# Patient Record
Sex: Female | Born: 1950
Health system: Southern US, Community
[De-identification: ages and names within clinical notes are randomized; demographics above are authoritative.]

## PROBLEM LIST (undated history)

## (undated) DIAGNOSIS — I10 Essential (primary) hypertension: Secondary | ICD-10-CM

## (undated) DIAGNOSIS — E039 Hypothyroidism, unspecified: Secondary | ICD-10-CM

## (undated) DIAGNOSIS — I639 Cerebral infarction, unspecified: Secondary | ICD-10-CM

## (undated) HISTORY — PX: TUBAL LIGATION: SHX77

## (undated) HISTORY — DX: Cerebral infarction, unspecified: I63.9

## (undated) HISTORY — DX: Hypothyroidism, unspecified: E03.9

## (undated) HISTORY — PX: LEG SURGERY: SHX1003

---

## 2013-05-19 ENCOUNTER — Ambulatory Visit (INDEPENDENT_AMBULATORY_CARE_PROVIDER_SITE_OTHER): Payer: BC Managed Care – PPO | Admitting: Internal Medicine

## 2013-05-19 ENCOUNTER — Ambulatory Visit: Payer: BC Managed Care – PPO

## 2013-05-19 VITALS — BP 170/98 | HR 72 | Temp 97.9°F | Resp 18 | Ht 63.0 in | Wt 219.8 lb

## 2013-05-19 DIAGNOSIS — IMO0001 Reserved for inherently not codable concepts without codable children: Secondary | ICD-10-CM

## 2013-05-19 DIAGNOSIS — G4710 Hypersomnia, unspecified: Secondary | ICD-10-CM

## 2013-05-19 DIAGNOSIS — R0602 Shortness of breath: Secondary | ICD-10-CM

## 2013-05-19 DIAGNOSIS — I517 Cardiomegaly: Secondary | ICD-10-CM

## 2013-05-19 DIAGNOSIS — Z0000 Encounter for general adult medical examination without abnormal findings: Secondary | ICD-10-CM

## 2013-05-19 DIAGNOSIS — R5383 Other fatigue: Secondary | ICD-10-CM

## 2013-05-19 DIAGNOSIS — R030 Elevated blood-pressure reading, without diagnosis of hypertension: Secondary | ICD-10-CM

## 2013-05-19 DIAGNOSIS — Z6838 Body mass index (BMI) 38.0-38.9, adult: Secondary | ICD-10-CM

## 2013-05-19 DIAGNOSIS — J301 Allergic rhinitis due to pollen: Secondary | ICD-10-CM

## 2013-05-19 DIAGNOSIS — F17200 Nicotine dependence, unspecified, uncomplicated: Secondary | ICD-10-CM

## 2013-05-19 DIAGNOSIS — R5381 Other malaise: Secondary | ICD-10-CM

## 2013-05-19 LAB — POCT CBC
Granulocyte percent: 65.9 %G (ref 37–80)
HCT, POC: 45.1 % (ref 37.7–47.9)
Hemoglobin: 13.9 g/dL (ref 12.2–16.2)
Lymph, poc: 2.5 (ref 0.6–3.4)
MCH: 29.8 pg (ref 27–31.2)
MCHC: 30.8 g/dL — AB (ref 31.8–35.4)
MCV: 96.5 fL (ref 80–97)
MID (cbc): 0.6 (ref 0–0.9)
MPV: 8.8 fL (ref 0–99.8)
PLATELET COUNT, POC: 354 10*3/uL (ref 142–424)
POC Granulocyte: 6.1 (ref 2–6.9)
POC LYMPH %: 27.2 % (ref 10–50)
POC MID %: 6.9 % (ref 0–12)
RBC: 4.67 M/uL (ref 4.04–5.48)
RDW, POC: 15.4 %
WBC: 9.2 10*3/uL (ref 4.6–10.2)

## 2013-05-19 LAB — COMPREHENSIVE METABOLIC PANEL
ALBUMIN: 4.1 g/dL (ref 3.5–5.2)
ALT: 15 U/L (ref 0–35)
AST: 17 U/L (ref 0–37)
Alkaline Phosphatase: 85 U/L (ref 39–117)
BUN: 15 mg/dL (ref 6–23)
CALCIUM: 9.1 mg/dL (ref 8.4–10.5)
CHLORIDE: 103 meq/L (ref 96–112)
CO2: 27 mEq/L (ref 19–32)
CREATININE: 0.95 mg/dL (ref 0.50–1.10)
Glucose, Bld: 123 mg/dL — ABNORMAL HIGH (ref 70–99)
POTASSIUM: 4 meq/L (ref 3.5–5.3)
Sodium: 140 mEq/L (ref 135–145)
Total Bilirubin: 0.5 mg/dL (ref 0.2–1.2)
Total Protein: 7.6 g/dL (ref 6.0–8.3)

## 2013-05-19 LAB — T4, FREE: Free T4: 0.28 ng/dL — ABNORMAL LOW (ref 0.80–1.80)

## 2013-05-19 LAB — TSH: TSH: 24.655 u[IU]/mL — ABNORMAL HIGH (ref 0.350–4.500)

## 2013-05-19 LAB — LIPID PANEL
CHOL/HDL RATIO: 8.2 ratio
Cholesterol: 337 mg/dL — ABNORMAL HIGH (ref 0–200)
HDL: 41 mg/dL (ref >39)
LDL CALC: 261 mg/dL — AB (ref 0–99)
Triglycerides: 177 mg/dL — ABNORMAL HIGH (ref <150)
VLDL: 35 mg/dL (ref 0–40)

## 2013-05-19 NOTE — Progress Notes (Addendum)
Subjective:    Patient ID: Carla Hopkins, female    DOB: 1950-07-08, 63 y.o.   MRN: [id number]  HPI This chart was scribed for Tami Lin, MD by Thea Alken, ED Scribe. This patient was seen in room 11 and the patient's care was started at 11:51 AM. She presents out of concern for her fatigue and shortness of breath.  HPI Comments: Carla Hopkins is a 63 y.o. female who presents to the [facility] for a physical. She reports that she has been tired and has had trouble sleeping. She reports when she wakes up in th morning she does not feel fully rested.  She states that when reading and watching TV she falls asleep easily. She denies falling asleep while driving long distances. She reports SOB while exercising. Pt states that she is a smoker but would love to quit. She reports that she has tried nicotine gum and patches. may go all day without smoking at work that day and smokes when she sits still at night or with coffee.   She denies HTN or medical illness. Pt denies taking medication for any illness. Pt states that she has annual allergies. Pt has never had a colonoscopy. She reports her last pap smear was about 10 years ago and her last mammogram was about 20 years ago. Pt has a bad knee due to a very old injury. She reports leg swelling during hot weather.   Unsure about immunizations History reviewed. No pertinent past medical history. 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 Negative for fever, activity change and unexpected weight change.  HENT: Negative for trouble swallowing.        Mouth breather at night/lives alone unsure if snores  Respiratory: Positive for shortness of breath. Negative for cough, chest tightness and wheezing.   Cardiovascular: Negative for chest pain, palpitations and  leg swelling.  Gastrointestinal: Negative for abdominal pain.  Genitourinary: Negative for frequency.  Neurological: Negative for weakness and headaches.  Psychiatric/Behavioral: Positive for sleep disturbance.       Wakes frequently/denies waking gasping for air Nonrestorative sleep Daytime hypersomnolence Falls asleep sitting to read but not driving  All other systems reviewed and are negative.      Objective:   Physical Exam  Nursing note and vitals reviewed. Constitutional: She is oriented to person, place, and time. She appears well-developed and well-nourished. No distress.  Very obese  HENT:  Head: Normocephalic and atraumatic.  Right Ear: External ear normal.  Left Ear: External ear normal.  Nose: Nose normal.  Mouth/Throat: Oropharynx is clear and moist.  Eyes: Conjunctivae and EOM are normal. Pupils are equal, round, and reactive to light.  Neck: Normal range of motion. Neck supple. No thyromegaly present.  Cardiovascular: Normal rate, regular rhythm, normal heart sounds and intact distal pulses.   No murmur heard. Pulmonary/Chest: Effort normal. She has no wheezes.  Distant breath sounds with shallow inspiration but no wheezing with forced expiration and no rhonchi  Abdominal: Soft. Bowel sounds are normal. She exhibits no mass. There is no tenderness.  Musculoskeletal: Normal range of motion. She exhibits no edema.  Lymphadenopathy:    She has no cervical adenopathy.  Neurological: She is alert and oriented to person, place, and time. She has normal reflexes. No cranial nerve deficit.  Skin: Skin is  warm and dry.  Psychiatric: She has a normal mood and affect. Her behavior is normal.   Filed Vitals:   05/19/13 1112  BP: 170/98  Pulse: 72  Temp: 97.9 F (36.6 C)  Resp: 18   Results for orders placed in visit on 05/19/13  POCT CBC      Result Value Ref Range   WBC 9.2  4.6 - 10.2 K/uL   Lymph, poc 2.5  0.6 - 3.4   POC LYMPH PERCENT 27.2  10 - 50 %L   MID  (cbc) 0.6  0 - 0.9   POC MID % 6.9  0 - 12 %M   POC Granulocyte 6.1  2 - 6.9   Granulocyte percent 65.9  37 - 80 %G   RBC 4.67  4.04 - 5.48 M/uL   Hemoglobin 13.9  12.2 - 16.2 g/dL   HCT, POC 16.145.1  09.637.7 - 47.9 %   MCV 96.5  80 - 97 fL   MCH, POC 29.8  27 - 31.2 pg   MCHC 30.8 (*) 31.8 - 35.4 g/dL   RDW, POC 04.515.4     Platelet Count, POC 354  142 - 424 K/uL   MPV 8.8  0 - 99.8 fL   [facility] reading (PRIMARY) by  Dr. Merla Richesoolittle= cardiomegaly?/? wide ascending aorta causing tracheal deviation      Assessment & Plan:  I have completed the patient encounter in its entirety as documented by the scribe, with editing by me where necessary. Robert P. Merla Richesoolittle, M.D.  Routine general medical examination at a health care facility - Plan: POCT CBC, T4, free, TSH, Lipid panel, Comprehensive metabolic panel  BMI 38.0-38.9,adult - Plan: Ambulatory referral to Sleep Studies  Other malaise and fatigue - Plan: POCT CBC, T4, free, TSH, Lipid panel, Comprehensive metabolic panel  SOB (shortness of breath) - Plan: DG Chest 2 View, EKG 12-Lead  Allergic rhinitis due to pollen  Hypersomnolence - Plan: Ambulatory referral to Sleep Studies  Nicotine addiction--change to ecigs then d/c nicotine  Right atrial enlargement suggesting pulmonary disease  Probable hypertension--will start medication after labs   We'll set up followup at 104-pulmonary function studies, update immunizations and arranging for colonoscopy etc.  ongoing primary care She will set up followup for mammogram and Pap smear with GYN  Adden: labs Results for orders placed in visit on 05/19/13  T4, FREE      Result Value Ref Range   Free T4 0.28 (*) 0.80 - 1.80 ng/dL  TSH      Result Value Ref Range   TSH 24.655 (*) 0.350 - 4.500 uIU/mL  LIPID PANEL      Result Value Ref Range   Cholesterol 337 (*) 0 - 200 mg/dL   Triglycerides 409177 (*) <150 mg/dL   HDL 41  >81>39 mg/dL   Total CHOL/HDL Ratio 8.2     VLDL 35  0 - 40 mg/dL   LDL  Cholesterol 191261 (*) 0 - 99 mg/dL  COMPREHENSIVE METABOLIC PANEL      Result Value Ref Range   Sodium 140  135 - 145 mEq/L   Potassium 4.0  3.5 - 5.3 mEq/L   Chloride 103  96 - 112 mEq/L   CO2 27  19 - 32 mEq/L   Glucose, Bld 123 (*) 70 - 99 mg/dL   BUN 15  6 - 23 mg/dL   Creat 4.780.95  2.950.50 - 6.211.10 mg/dL   Total Bilirubin 0.5  0.2 - 1.2 mg/dL   Alkaline  Phosphatase 85  39 - 117 U/L   AST 17  0 - 37 U/L   ALT 15  0 - 35 U/L   Total Protein 7.6  6.0 - 8.3 g/dL   Albumin 4.1  3.5 - 5.2 g/dL   Calcium 9.1  8.4 - 10.5 mg/dL  POCT CBC      Result Value Ref Range   WBC 9.2  4.6 - 10.2 K/uL   Lymph, poc 2.5  0.6 - 3.4   POC LYMPH PERCENT 27.2  10 - 50 %L   MID (cbc) 0.6  0 - 0.9   POC MID % 6.9  0 - 12 %M   POC Granulocyte 6.1  2 - 6.9   Granulocyte percent 65.9  37 - 80 %G   RBC 4.67  4.04 - 5.48 M/uL   Hemoglobin 13.9  12.2 - 16.2 g/dL   HCT, POC 45.1  37.7 - 47.9 %   MCV 96.5  80 - 97 fL   MCH, POC 29.8  27 - 31.2 pg   MCHC 30.8 (*) 31.8 - 35.4 g/dL   RDW, POC 15.4     Platelet Count, POC 354  142 - 424 K/uL   MPV 8.8  0 - 99.8 fL   Will need contr thyr and rx lipids

## 2013-05-20 ENCOUNTER — Telehealth: Payer: Self-pay | Admitting: General Practice

## 2013-05-20 NOTE — Telephone Encounter (Signed)
Called and left vm for patient to give [place] a call back to establish care with a female provider that's accepting new patients

## 2013-05-23 MED ORDER — LEVOTHYROXINE SODIUM 50 MCG PO TABS: 50.0000 ug | ORAL_TABLET | Freq: Every day | ORAL | Status: DC

## 2013-05-23 NOTE — Addendum Note (Signed)
Addended by: Jethro Bolus A on: 05/23/2013 10:05 AM   Modules accepted: Orders

## 2013-05-24 ENCOUNTER — Other Ambulatory Visit: Payer: Self-pay

## 2013-05-24 NOTE — Telephone Encounter (Signed)
Opened in error

## 2013-05-27 ENCOUNTER — Institutional Professional Consult (permissible substitution): Payer: BC Managed Care – PPO | Admitting: Neurology

## 2013-07-02 ENCOUNTER — Ambulatory Visit (INDEPENDENT_AMBULATORY_CARE_PROVIDER_SITE_OTHER): Payer: BC Managed Care – PPO | Admitting: Family Medicine

## 2013-07-02 ENCOUNTER — Encounter: Payer: Self-pay | Admitting: Family Medicine

## 2013-07-02 VITALS — BP 158/90 | HR 73 | Temp 98.4°F | Resp 16 | Ht 62.5 in | Wt 216.0 lb

## 2013-07-02 DIAGNOSIS — E039 Hypothyroidism, unspecified: Secondary | ICD-10-CM

## 2013-07-02 DIAGNOSIS — Z6838 Body mass index (BMI) 38.0-38.9, adult: Secondary | ICD-10-CM

## 2013-07-02 NOTE — Patient Instructions (Signed)

## 2013-07-02 NOTE — Progress Notes (Signed)
S:  This 63 y.o. 43 female is here for f/u labs for hypothyroidism, diagnosed ~ 6 weeks ago. She has very dry skin; a friend noticed this and advised thyroid testing. She was seen at [facility] in late Feb 2015; Dr. Laney Pastor performed CPE and blood test and pt found to be thyroid deficient. She is taking Levothyroxine 50 mcg daily and reports not feeling any different. She has problem w/ weight also.  Patient Active Problem List   Diagnosis Date Noted  . BMI 38.0-38.9,adult 05/19/2013  . Nicotine addiction 05/19/2013  . Hypersomnolence 05/19/2013  . Elevated blood pressure 05/19/2013  . Right atrial enlargement 05/19/2013   Prior to Admission medications   Medication Sig Start Date End Date Taking? Authorizing Provider  levothyroxine (SYNTHROID, LEVOTHROID) 50 MCG tablet Take 1 tablet (50 mcg total) by mouth daily. 05/23/13  Yes Leandrew Koyanagi, MD   PMHx, Surg Hx, Soc and Fam Hx reviewed.  ROS: As per HPI.  O: Filed Vitals:   07/02/13 1700  BP: 158/90  Pulse: 73  Temp: 98.4 F (36.9 C)  Resp: 16   GEN: In NAD: WN,WD. Weight is down 2+ lbs. HENT: [place]/AT; Otherwise unremarkable. COR: RRR. LUNGS: Unlabored resp. SKIN: W&D; intact w/o diaphoresis or erythema. MS: MAEs; no c/c/e. NEURO: A&O x 3; Cns intact. Nonfocal.  A/P: Unspecified hypothyroidism - Continue current dose of medication pending lab. Will adjust medication accordingly.      Plan: TSH  BMI 38.0-38.9,adult - Plan: Vitamin D, 25-hydroxy

## 2013-07-03 ENCOUNTER — Other Ambulatory Visit: Payer: Self-pay | Admitting: Family Medicine

## 2013-07-03 LAB — TSH: TSH: 14.716 u[IU]/mL — ABNORMAL HIGH (ref 0.350–4.500)

## 2013-07-03 LAB — VITAMIN D 25 HYDROXY (VIT D DEFICIENCY, FRACTURES): Vit D, 25-Hydroxy: 10 ng/mL — ABNORMAL LOW (ref 30–89)

## 2013-07-03 MED ORDER — ERGOCALCIFEROL 1.25 MG (50000 UT) PO CAPS: 50000.0000 [IU] | ORAL_CAPSULE | ORAL | Status: DC

## 2013-07-03 MED ORDER — LEVOTHYROXINE SODIUM 100 MCG PO TABS: 100.0000 ug | ORAL_TABLET | Freq: Every day | ORAL | Status: DC

## 2013-07-03 NOTE — Progress Notes (Signed)
Quick Note:  Please advise pt regarding following labs... TSH results indicates that your thyroid medication dose needs to be increased. Take 2 tablets = 100 mcg daily. I will refill your medication with the new dose; each tablet will be 100 mcg and I will only prescribe 30 with 2 refills. Thyroid function will need to be rechecked in 6-8 weeks.  Your Vitamin D level is very low. I will prescribe a high-dose Vitamin D supplement to be taken once a week. This will be available at your pharmacy for pick-up later today or tomorrow.  Copy to pt. ______

## 2013-07-11 ENCOUNTER — Encounter: Payer: Self-pay | Admitting: Neurology

## 2013-07-11 ENCOUNTER — Ambulatory Visit (INDEPENDENT_AMBULATORY_CARE_PROVIDER_SITE_OTHER): Payer: BC Managed Care – PPO | Admitting: Neurology

## 2013-07-11 VITALS — BP 179/101 | HR 78 | Temp 97.4°F | Ht 62.5 in | Wt 216.0 lb

## 2013-07-11 DIAGNOSIS — G478 Other sleep disorders: Secondary | ICD-10-CM

## 2013-07-11 DIAGNOSIS — I10 Essential (primary) hypertension: Secondary | ICD-10-CM

## 2013-07-11 DIAGNOSIS — G4710 Hypersomnia, unspecified: Secondary | ICD-10-CM

## 2013-07-11 DIAGNOSIS — R400 Somnolence: Secondary | ICD-10-CM

## 2013-07-11 DIAGNOSIS — F17200 Nicotine dependence, unspecified, uncomplicated: Secondary | ICD-10-CM

## 2013-07-11 DIAGNOSIS — E039 Hypothyroidism, unspecified: Secondary | ICD-10-CM

## 2013-07-11 DIAGNOSIS — R0609 Other forms of dyspnea: Secondary | ICD-10-CM

## 2013-07-11 DIAGNOSIS — E669 Obesity, unspecified: Secondary | ICD-10-CM

## 2013-07-11 DIAGNOSIS — J988 Other specified respiratory disorders: Secondary | ICD-10-CM

## 2013-07-11 DIAGNOSIS — R0989 Other specified symptoms and signs involving the circulatory and respiratory systems: Secondary | ICD-10-CM

## 2013-07-11 DIAGNOSIS — R351 Nocturia: Secondary | ICD-10-CM

## 2013-07-11 DIAGNOSIS — R0683 Snoring: Secondary | ICD-10-CM

## 2013-07-11 NOTE — Progress Notes (Signed)
Subjective:    Patient ID: Carla Hopkins is a 63 y.o. female.  HPI    Carla Age, Carla Hopkins, Carla Hopkins [facility] [address] [place], [place] [address]  Dear Dr. Laney Pastor,  I saw your patient, Carla Hopkins, upon your kind request in my neurologic clinic today for initial consultation of her sleep disorder, in particular, concern for prescriptive sleep apnea. The patient is unaccompanied today. As you know, Carla Hopkins is a 63 year old right-handed woman with an underlying medical history of obesity, hypothyroidism, vitamin D deficiency, allergic rhinitis, and smoking, who reports snoring, daytime somnolence, multiple nighttime awakenings and nocturia as well as non-restorative sleep. She works in Therapist, art at [employer]. She was recently diagnosed with hypothyroidism and in the process of adjusting her meds. She has had elevated BPs and high cholesterol.   Her typical bedtime is reported to be around 10 to 10:30 PM and usual wake time is around 5:45 AM. Sleep onset typically occurs within a few minutes. She reports feeling marginally rested upon awakening. She wakes up on an average 3 times in the middle of the night and has to go to the bathroom 2 to 3 times on a typical night. She denies morning headaches.  She reports excessive daytime somnolence (EDS) and Her Epworth Sleepiness Score (ESS) is 11/24 today. She has not fallen asleep while driving. The patient has not been taking a planned nap.  She has been known to snore for the past many years. Snoring is reportedly marked, and unclear is associated with choking sounds or witnessed apneas. She sleeps alone. The patient reports mouth breathing, but does not report a clear sensation of choking or strangling feeling. There is no report of nighttime reflux, with no nighttime cough experienced. The patient has not noted any RLS symptoms and is not known to kick while asleep or before falling asleep. There is no  family history known to her, as she was adopted.    She denies cataplexy, sleep paralysis, hypnagogic or hypnopompic hallucinations, or sleep attacks. She does not report any vivid dreams, nightmares, dream enactments, or parasomnias, such as sleep talking or sleep walking. The patient has not had a sleep study or a home sleep test.  She consumes 2 caffeinated beverages per day, usually in the form of coffee in the morning and iced coffee at night.  Her bedroom is usually dark and cool. There is no TV in the bedroom and her four cats stay out of her bedroom. She lives alone.   Her Past Medical History Is Significant For: Past Medical History  Diagnosis Date  . Hypothyroidism     Her Past Surgical History Is Significant For: Past Surgical History  Procedure Laterality Date  . Tubal ligation      Her Family History Is Significant For: Family History  Problem Relation Hopkins of Onset  . Adopted: Yes    Her Social History Is Significant For: History   Social History  . Marital Status: Single    Spouse Name: N/A    Number of Children: N/A  . Years of Education: N/A   Social History Main Topics  . Smoking status: Current Every Day Smoker -- 20.00 packs/day    Types: Cigarettes  . Smokeless tobacco: None  . Alcohol Use: Yes     Comment: ocassionally - wine  . Drug Use: No  . Sexual Activity: Not Currently   Other Topics Concern  . None   Social History Narrative  .  None    Her Allergies Are:  No Known Allergies:   Her Current Medications Are:  Outpatient Encounter Prescriptions as of 07/11/2013  Medication Sig  . ergocalciferol (DRISDOL) 50000 UNITS capsule Take 1 capsule (50,000 Units total) by mouth once a week.  . levothyroxine (SYNTHROID, LEVOTHROID) 100 MCG tablet Take 1 tablet (100 mcg total) by mouth daily.  :  Review of Systems:  Out of a complete 14 point review of systems, all are reviewed and negative with the exception of these symptoms as listed below:    Review of Systems  Constitutional: Positive for fatigue.  HENT: Negative.   Eyes: Negative.   Respiratory: Positive for shortness of breath.   Cardiovascular: Negative.   Gastrointestinal: Negative.   Endocrine: Negative.   Genitourinary: Negative.   Musculoskeletal: Negative.   Skin: Negative.   Allergic/Immunologic: Positive for environmental allergies.  Neurological: Negative.   Hematological: Negative.   Psychiatric/Behavioral: Positive for sleep disturbance (e.d.s.).    Objective:  Neurologic Exam  Physical Exam Physical Examination:   Filed Vitals:   07/11/13 0833  BP: 179/101  Pulse: 78  Temp: 97.4 F (36.3 C)    General Examination: The patient is a very pleasant 63 y.o. female in no acute distress. She appears well-developed and well-nourished and well groomed.   HEENT: Normocephalic, atraumatic, pupils are equal, round and reactive to light and accommodation. Funduscopic exam is normal with sharp disc margins noted. Extraocular tracking is good without limitation to gaze excursion or nystagmus noted. Normal smooth pursuit is noted. Hearing is grossly intact. Tympanic membranes are clear bilaterally. Face is symmetric with normal facial animation and normal facial sensation. Speech is clear with no dysarthria noted. There is no hypophonia. There is no lip, neck/head, jaw or voice tremor. Neck is supple with full range of passive and active motion. There are no carotid bruits on auscultation. Oropharynx exam reveals: mild mouth dryness, adequate dental hygiene and moderate airway crowding, due to narrow airway and thick uvula. Mallampati is class II. Tongue protrudes centrally and palate elevates symmetrically. Tonsils are absent. Neck size is 16 7/8 inches.   Chest: Clear to auscultation without wheezing, rhonchi or crackles noted.  Heart: S1+S2+0, regular and normal without murmurs, rubs or gallops noted.   Abdomen: Soft, non-tender and non-distended with normal  bowel sounds appreciated on auscultation.  Extremities: There is no pitting edema in the distal lower extremities bilaterally. Pedal pulses are intact.  Skin: Warm and dry without trophic changes noted. There are no varicose veins.  Musculoskeletal: exam reveals no obvious joint deformities, tenderness or joint swelling or erythema.   Neurologically:  Mental status: The patient is awake, alert and oriented in all 4 spheres. Her immediate and remote memory, attention, language skills and fund of knowledge are appropriate. There is no evidence of aphasia, agnosia, apraxia or anomia. Speech is clear with normal prosody and enunciation. Thought process is linear. Mood is normal and affect is normal.  Cranial nerves II - XII are as described above under HEENT exam. In addition: shoulder shrug is normal with equal shoulder height noted. Motor exam: Normal bulk, strength and tone is noted. There is no drift, tremor or rebound. Romberg is negative. Reflexes are 2+ throughout. Babinski: Toes are flexor bilaterally. Fine motor skills and coordination: intact with normal finger taps, normal hand movements, normal rapid alternating patting, normal foot taps and normal foot agility.  Cerebellar testing: No dysmetria or intention tremor on finger to nose testing. Heel to shin is unremarkable bilaterally.  There is no truncal or gait ataxia.  Sensory exam: intact to light touch, pinprick, vibration, temperature sense and proprioception in the upper and lower extremities.  Gait, station and balance: She stands easily. No veering to one side is noted. No leaning to one side is noted. Posture is Hopkins-appropriate and stance is narrow based. Gait shows normal stride length and normal pace. No problems turning are noted. She turns en bloc. Tandem walk is unremarkable. Intact toe and heel stance is noted.               Assessment and Plan:   In summary, Carla Hopkins is a very pleasant 63 y.o.-year old female with an  underlying medical history of obesity, hypothyroidism, vitamin D deficiency, allergic rhinitis, and smokingwith a history and physical exam concerning for obstructive sleep apnea (OSA). She has had elevated BP values, larger neck, narrow airway entry, snores, wakes up multiple times in the night and has nocturia, and reports EDS. I had a long chat with the patient about my findings and the diagnosis of OSA, its prognosis and treatment options. We talked about medical treatments, surgical interventions and non-pharmacological approaches. I explained in particular the risks and ramifications of untreated moderate to severe OSA, especially with respect to developing cardiovascular disease down the Road, including congestive heart failure, difficult to treat hypertension, cardiac arrhythmias, or stroke. Even type 2 diabetes has, in part, been linked to untreated OSA. Symptoms of untreated OSA include daytime sleepiness, memory problems, mood irritability and mood disorder such as depression and anxiety, lack of energy, as well as recurrent headaches, especially morning headaches. We talked about smoking cessation and trying to maintain a healthy lifestyle in general, as well as the importance of weight control. I encouraged the patient to eat healthy, exercise daily and keep well hydrated, to keep a scheduled bedtime and wake time routine, to not skip any meals and eat healthy snacks in between meals. I advised the patient not to drive when feeling sleepy. I recommended the following at this time: sleep study with potential positive airway pressure titration.  I explained the sleep test procedure to the patient and also outlined possible surgical and non-surgical treatment options of OSA, including the use of a custom-made dental device (which would require a referral to a specialist dentist or oral surgeon), upper airway surgical options, such as pillar implants, radiofrequency surgery, tongue base surgery, and UPPP  (which would involve a referral to an ENT surgeon). Rarely, jaw surgery such as mandibular advancement may be considered.  I also explained the CPAP treatment option to the patient, who indicated that she would be willing to try CPAP if the need arises. I explained the importance of being compliant with PAP treatment, not only for insurance purposes but primarily to improve Her symptoms, and for the patient's long term health benefit, including to reduce Her cardiovascular risks. I answered all her questions today and the patient was in agreement. I would like to see her back after the sleep study is completed and encouraged her to call with any interim questions, concerns, problems or updates.   Thank you very much for allowing me to participate in the care of this nice patient. If I can be of any further assistance to you please do not hesitate to call me at [phone].  Sincerely,   Carla Age, Carla Hopkins, Carla Hopkins

## 2013-07-11 NOTE — Patient Instructions (Signed)

## 2013-08-14 ENCOUNTER — Ambulatory Visit (INDEPENDENT_AMBULATORY_CARE_PROVIDER_SITE_OTHER): Payer: Self-pay | Admitting: Neurology

## 2013-08-14 DIAGNOSIS — I10 Essential (primary) hypertension: Secondary | ICD-10-CM

## 2013-08-14 DIAGNOSIS — R0683 Snoring: Secondary | ICD-10-CM

## 2013-08-14 DIAGNOSIS — F17200 Nicotine dependence, unspecified, uncomplicated: Secondary | ICD-10-CM

## 2013-08-14 DIAGNOSIS — G478 Other sleep disorders: Secondary | ICD-10-CM

## 2013-08-14 DIAGNOSIS — E669 Obesity, unspecified: Secondary | ICD-10-CM

## 2013-08-14 DIAGNOSIS — E039 Hypothyroidism, unspecified: Secondary | ICD-10-CM

## 2013-08-14 DIAGNOSIS — R400 Somnolence: Secondary | ICD-10-CM

## 2013-08-14 DIAGNOSIS — J988 Other specified respiratory disorders: Secondary | ICD-10-CM

## 2013-08-16 ENCOUNTER — Telehealth: Payer: Self-pay | Admitting: Neurology

## 2013-08-16 DIAGNOSIS — G4733 Obstructive sleep apnea (adult) (pediatric): Secondary | ICD-10-CM

## 2013-08-16 NOTE — Telephone Encounter (Signed)
Please call and notify patient that the recent sleep study confirmed the diagnosis of OSA. She did well with CPAP during the study with significant improvement of the respiratory events. Therefore, I would like start the patient on CPAP at home. I placed the order in the chart.   Arrange for CPAP set up at home through a DME company of patient's choice and fax/route report to PCP and referring MD (if other than PCP).   The patient will also need a follow up appointment with me in 6-8 weeks post set up that has to be scheduled; help the patient schedule this (in a follow-up slot).   Please re-enforce the importance of compliance with treatment and the need for [place] to monitor compliance data.   Once you have spoken to the patient and scheduled the return appointment, you may close this encounter, thanks,   Star Age, MD, PhD [facility] ([place])

## 2013-08-16 NOTE — Telephone Encounter (Signed)
Called patient to discuss sleep study results - left detailed message on phone.   Left message for patient regarding sleep study results, asked patient to call us back at the sleep lab to discuss results and have questions answered, also to let us know what DME she prefers if any..  Explained that a copy of the sleep study was sent to referring physician and copy of study is coming to them in the mail.

## 2013-08-18 DIAGNOSIS — G4733 Obstructive sleep apnea (adult) (pediatric): Secondary | ICD-10-CM

## 2013-08-21 ENCOUNTER — Encounter: Payer: Self-pay | Admitting: *Deleted

## 2013-09-25 ENCOUNTER — Encounter: Payer: Self-pay | Admitting: Family Medicine

## 2013-09-25 ENCOUNTER — Ambulatory Visit (INDEPENDENT_AMBULATORY_CARE_PROVIDER_SITE_OTHER): Payer: BC Managed Care – PPO | Admitting: Family Medicine

## 2013-09-25 VITALS — BP 164/100 | HR 88 | Temp 98.9°F | Resp 16 | Ht 63.0 in | Wt 217.0 lb

## 2013-09-25 DIAGNOSIS — E119 Type 2 diabetes mellitus without complications: Secondary | ICD-10-CM | POA: Insufficient documentation

## 2013-09-25 DIAGNOSIS — E039 Hypothyroidism, unspecified: Secondary | ICD-10-CM

## 2013-09-25 DIAGNOSIS — R739 Hyperglycemia, unspecified: Secondary | ICD-10-CM

## 2013-09-25 DIAGNOSIS — I10 Essential (primary) hypertension: Secondary | ICD-10-CM

## 2013-09-25 DIAGNOSIS — R7309 Other abnormal glucose: Secondary | ICD-10-CM

## 2013-09-25 LAB — POCT GLYCOSYLATED HEMOGLOBIN (HGB A1C): HEMOGLOBIN A1C: 7

## 2013-09-25 LAB — GLUCOSE, POCT (MANUAL RESULT ENTRY): POC Glucose: 125 mg/dl — AB (ref 70–99)

## 2013-09-25 MED ORDER — LISINOPRIL 5 MG PO TABS: 5.0000 mg | ORAL_TABLET | Freq: Every day | ORAL | Status: DC

## 2013-09-25 NOTE — Patient Instructions (Addendum)
Hypertension Hypertension, commonly called high blood pressure, is when the force of blood pumping through your arteries is too strong. Your arteries are the blood vessels that carry blood from your heart throughout your body. A blood pressure reading consists of a higher number over a lower number, such as 110/72. The higher number (systolic) is the pressure inside your arteries when your heart pumps. The lower number (diastolic) is the pressure inside your arteries when your heart relaxes. Ideally you want your blood pressure below 120/80. Hypertension forces your heart to work harder to pump blood. Your arteries may become narrow or stiff. Having hypertension puts you at risk for heart disease, stroke, and other problems.  RISK FACTORS Some risk factors for high blood pressure are controllable. Others are not.  Risk factors you cannot control include:   Race. You may be at higher risk if you are African American.  Age. Risk increases with age.  Gender. Men are at higher risk than women before age 45 years. After age 65, women are at higher risk than men. Risk factors you can control include:  Not getting enough exercise or physical activity.  Being overweight.  Getting too much fat, sugar, calories, or salt in your diet.  Drinking too much alcohol. SIGNS AND SYMPTOMS Hypertension does not usually cause signs or symptoms. Extremely high blood pressure (hypertensive crisis) may cause headache, anxiety, shortness of breath, and nosebleed. DIAGNOSIS  To check if you have hypertension, your health care provider will measure your blood pressure while you are seated, with your arm held at the level of your heart. It should be measured at least twice using the same arm. Certain conditions can cause a difference in blood pressure between your right and left arms. A blood pressure reading that is higher than normal on one occasion does not mean that you need treatment. If one blood pressure reading  is high, ask your health care provider about having it checked again. TREATMENT  Treating high blood pressure includes making lifestyle changes and possibly taking medication. Living a healthy lifestyle can help lower high blood pressure. You may need to change some of your habits. Lifestyle changes may include:  Following the DASH diet. This diet is high in fruits, vegetables, and whole grains. It is low in salt, red meat, and added sugars.  Getting at least 2 1/2 hours of brisk physical activity every week.  Losing weight if necessary.  Not smoking.  Limiting alcoholic beverages.  Learning ways to reduce stress. If lifestyle changes are not enough to get your blood pressure under control, your health care provider may prescribe medicine. You may need to take more than one. Work closely with your health care provider to understand the risks and benefits. HOME CARE INSTRUCTIONS  Have your blood pressure rechecked as directed by your health care provider.   Only take medicine as directed by your health care provider. Follow the directions carefully. Blood pressure medicines must be taken as prescribed. The medicine does not work as well when you skip doses. Skipping doses also puts you at risk for problems.   Do not smoke.   Monitor your blood pressure at home as directed by your health care provider. SEEK MEDICAL CARE IF:   You think you are having a reaction to medicines taken.  You have recurrent headaches or feel dizzy.  You have swelling in your ankles.  You have trouble with your vision. SEEK IMMEDIATE MEDICAL CARE IF:  You develop a severe headache or   confusion.  You have unusual weakness, numbness, or feel faint.  You have severe chest or abdominal pain.  You vomit repeatedly.  You have trouble breathing. MAKE SURE YOU:   Understand these instructions.  Will watch your condition.  Will get help right away if you are not doing well or get  worse. Document Released: 03/14/2005 Document Revised: 03/19/2013 Document Reviewed: 01/04/2013 [facility] Patient Information 2015 [place], [place]. This information is not intended to replace advice given to you by your health care provider. Make sure you discuss any questions you have with your health care provider.    Women and Heart Disease Heart disease (HD) risk factors for both men and women are similar. Most studies addressing the diagnosis and treatment of heart disease have focused primarily on men. More research is now being done on women and heart disease.  GENDER DIFFERENCES  Symptoms of a heart attack for women may be more subtle, less typical and harder to identify.  Women are often slow to recognize heart disease risk factors and symptoms.  More women than men die from a heart attack before reaching a hospital.  Results of medical tests can vary by gender, especially electrocardiogram stress testing.  A common perception is that men are more likely to have heart problems. This is not true, especially in women after menopause.  Effects of estrogen, birth control and hormone therapy can have unique effects on the heart.  Women are more likely to be referred for a mental health evaluation of their symptoms. CAUSES Heart disease may be caused from conditions such as:  Buildup of fat-like deposits (plaques) in the blood vessels (coronary arteries) of the heart. The build up of fat-like deposits cause blockages that decrease the blood flow to the heart muscle.  Blockage or narrowing of the coronary arteries decreases oxygen to the heart muscle.  Abnormal heart rhythms or problems with the electrical system of the heart.  Heart muscle that has become enlarged or weak and cannot pump well.  Abnormal heart valves that either leak or are thickened and do not open and close properly.  Damage from infection or drugs.  Heart problems present at birth. RISK FACTORS  Family  history.  Elevated blood lipid levels (cholesterol).  High blood pressure.  Diabetes.  Smoking.  Inactivity, lack of exercise.  Weighing 30% more than your ideal weight.  Age.  Past history of heart problems. SYMPTOMS  Chest discomfort or pressure, which may include the following:  Discomfort, fullness, tightness, squeezing in center of chest that stays for a few minutes or comes and goes.  Discomfort or pressure that spreads to upper back, shoulders, neck, jaw or stomach.  Discomfort or tingling in the arms.  Profuse or clammy sweating.  Difficulty breathing.  Nausea.  Feeling your heart "flutter" or "jump."  Unexplained feelings of anxiety, fatigue or weakness.  Dizziness. DIAGNOSIS Diagnosis may include a test that:  Records the electrical activity of the heart and looks for changes (EKG [electrocardiogram]) .  Detects the presence of special proteins and enzymes that may show damage to the heart muscle (blood tests).  Looks at the blood flow through the heart and coronary arteries by using special dyes and X-rays (coronary angiography).  Uses sound waves to examine your heart valves, muscle function and blood flow within the heart (echo or echocardiogram).  Looks for symptoms as your heart works harder under stress (stress tests).  Creates images of the heart by detecting radiation following administration of a radioactive tracer (  nuclear imaging).  Records the electrical activity of the heart and helps in detecting abnormalities of heart rhythm (electrophysiology).  Creates an image of the anatomy of the heart (CT heart scan). TREATMENT   Medications may be used to control your blood pressure, keep your heart beating regularly, reduce pain, and help your breathing.  If you are admitted to the hospital, the length of your stay depends on the amount of heart damage and any complications you may have.  Severe heart problems may require open heart  surgery. This is a procedure where blocked coronary arteries are bypassed with a vein from your leg.  If you have a single small coronary artery blockage and no heart damage, you may have a balloon angioplasty. This procedure uses stents that may help open the blockage and restore normal heart circulation. Stents are small, wire, mesh-like tubes that help keep the artery open.  If needed, blood thinners may be used to dissolve clots. HOME CARE INSTRUCTIONS  Follow the treatment plan your caregiver prescribes.  Keep a list of every medicine you are taking. Keep it up to date and with you all the time.  Get help from your caregiver or pharmacist to learn the following about each medicine:  Why you are taking it.  What time of day to take it.  Possible side effects.  What foods to take with your medication and which foods to avoid.  When to stop taking your medication.  Try to maintain normal cholesterol levels.  Eat a heart healthy diet with salt and fat restrictions as advised. PREVENTION  You can reduce your risk of heart disease by doing the following:  Visit your caregiver regularly and determine whether you are at risk.  Quit smoking and keep away from those who smoke.  Get your blood pressure checked regularly and make sure it remains within normal limits.  Limit salt in your diet.  Maintain normal blood sugar and cholesterol levels.  Exercise regularly (walking or another form of aerobic activity, preferably 30 minutes continuously).  Maintain ideal body weight.  Reduce stress, anger, and depression.  If you have already had a heart attack, consult your caregiver about methods and medicines that may help in reducing your risk of having a second heart attack.  Be aware of the symptoms of heart disease and seek medical care if you develop these symptoms. SEEK IMMEDIATE MEDICAL CARE IF:  You have severe chest pain or the above symptoms, call your local emergency  services (911 if in [place] THIS IS AN EMERGENCY. Do not wait to see if the pain will go away. DO NOT drive yourself to the hospital.  You notice increasing shortness of breath during rest, sleeping or with activity. Insist that providers take your complaints seriously and do a thorough heart evaluation. Document Released: 08/31/2007 Document Revised: 06/06/2011 Document Reviewed: 08/31/2007 [facility] Patient Information 2015 [place], [place]. This information is not intended to replace advice given to you by your health care provider. Make sure you discuss any questions you have with your health care provider.    Diabetes Mellitus and Food It is important for you to manage your blood sugar (glucose) level. Your blood glucose level can be greatly affected by what you eat. Eating healthier foods in the appropriate amounts throughout the day at about the same time each day will help you control your blood glucose level. It can also help slow or prevent worsening of your diabetes mellitus. Healthy eating may even help you improve the  level of your blood pressure and reach or maintain a healthy weight.  HOW CAN FOOD AFFECT ME? Carbohydrates Carbohydrates affect your blood glucose level more than any other type of food. Your dietitian will help you determine how many carbohydrates to eat at each meal and teach you how to count carbohydrates. Counting carbohydrates is important to keep your blood glucose at a healthy level, especially if you are using insulin or taking certain medicines for diabetes mellitus. Alcohol Alcohol can cause sudden decreases in blood glucose (hypoglycemia), especially if you use insulin or take certain medicines for diabetes mellitus. Hypoglycemia can be a life-threatening condition. Symptoms of hypoglycemia (sleepiness, dizziness, and disorientation) are similar to symptoms of having too much alcohol.  If your health care provider has given you approval to drink alcohol, do so in  moderation and use the following guidelines:  Women should not have more than one drink per day, and men should not have more than two drinks per day. One drink is equal to:  12 oz of beer.  5 oz of wine.  1 oz of hard liquor.  Do not drink on an empty stomach.  Keep yourself hydrated. Have water, diet soda, or unsweetened iced tea.  Regular soda, juice, and other mixers might contain a lot of carbohydrates and should be counted. WHAT FOODS ARE NOT RECOMMENDED? As you make food choices, it is important to remember that all foods are not the same. Some foods have fewer nutrients per serving than other foods, even though they might have the same number of calories or carbohydrates. It is difficult to get your body what it needs when you eat foods with fewer nutrients. Examples of foods that you should avoid that are high in calories and carbohydrates but low in nutrients include:  Trans fats (most processed foods list trans fats on the Nutrition Facts label).  Regular soda.  Juice.  Candy.  Sweets, such as cake, pie, doughnuts, and cookies.  Fried foods. WHAT FOODS CAN I EAT? Have nutrient-rich foods, which will nourish your body and keep you healthy. The food you should eat also will depend on several factors, including:  The calories you need.  The medicines you take.  Your weight.  Your blood glucose level.  Your blood pressure level.  Your cholesterol level. You also should eat a variety of foods, including:  Protein, such as meat, poultry, fish, tofu, nuts, and seeds (lean animal proteins are best).  Fruits.  Vegetables.  Dairy products, such as milk, cheese, and yogurt (low fat is best).  Breads, grains, pasta, cereal, rice, and beans.  Fats such as olive oil, trans fat-free margarine, canola oil, avocado, and olives. DOES EVERYONE WITH DIABETES MELLITUS HAVE THE SAME MEAL PLAN? Because every person with diabetes mellitus is different, there is not one  meal plan that works for everyone. It is very important that you meet with a dietitian who will help you create a meal plan that is just right for you. Document Released: 12/09/2004 Document Revised: 03/19/2013 Document Reviewed: 02/08/2013 [facility] Patient Information 2015 [place], [place]. This information is not intended to replace advice given to you by your health care provider. Make sure you discuss any questions you have with your health care provider.

## 2013-09-25 NOTE — Progress Notes (Signed)
Subjective:    Patient ID: Carla Hopkins, female    DOB: [birth year], 63 y.o.   MRN: [id number]  HPI  This 63 y.o. Cauc female is here for hypothyroid follow-up; medication had to be increase after labs resulted at last visit in April. Pt feels better w/ more energy but skin is still quite dry. Weight is stable.    She has persistent elevation of BP and admits excessive salt intake. She denies diaphoresis, fatigue, vision disturbances, CP or tightness, palpitations, SOB or DOE, edema, HA, dizziness, numbness, weakness or syncope.   Patient Active Problem List   Diagnosis Date Noted  . BMI 38.0-38.9,adult 05/19/2013  . Nicotine addiction 05/19/2013  . Hypersomnolence 05/19/2013  . Elevated blood pressure 05/19/2013  . Right atrial enlargement 05/19/2013   Prior to Admission medications   Medication Sig Start Date End Date Taking? Authorizing Provider  ergocalciferol (DRISDOL) 50000 UNITS capsule Take 1 capsule (50,000 Units total) by mouth once a week. 07/03/13 07/03/14 Yes Barton Fanny, MD  levothyroxine (SYNTHROID, LEVOTHROID) 100 MCG tablet Take 1 tablet (100 mcg total) by mouth daily. 07/03/13  Yes Barton Fanny, MD   PMHx, Surg Hx, Soc and Fam Hx reviewed.   Review of Systems  Constitutional: Negative for activity change and unexpected weight change.  Respiratory: Negative for cough, chest tightness and shortness of breath.   Cardiovascular: Negative for chest pain, palpitations and leg swelling.  Endocrine: Negative.   Neurological: Negative.   Psychiatric/Behavioral: Negative.       Objective:   Physical Exam  Nursing note and vitals reviewed. Constitutional: She is oriented to person, place, and time. She appears well-developed and well-nourished. No distress.  HENT:  Head: Normocephalic and atraumatic.  Right Ear: External ear normal.  Left Ear: External ear normal.  Nose: Nose normal.  Mouth/Throat: Oropharynx is clear and moist.  Eyes: Conjunctivae and  EOM are normal. Pupils are equal, round, and reactive to light. No scleral icterus.  Neck: Neck supple. No thyromegaly present.  Cardiovascular: Normal rate and regular rhythm.   Pulmonary/Chest: Effort normal. No respiratory distress.  Musculoskeletal: Normal range of motion.  Neurological: She is alert and oriented to person, place, and time. No cranial nerve deficit. She exhibits normal muscle tone. Coordination normal.  Skin: Skin is warm and dry. She is not diaphoretic.  Psychiatric: She has a normal mood and affect. Her behavior is normal. Judgment and thought content normal.    Results for orders placed in visit on 09/25/13  POCT GLYCOSYLATED HEMOGLOBIN (HGB A1C)      Result Value Ref Range   Hemoglobin A1C 7.0    GLUCOSE, POCT (MANUAL RESULT ENTRY)      Result Value Ref Range   POC Glucose 125 (*) 70 - 99 mg/dl       Assessment & Plan:  Unspecified hypothyroidism - Plan: Thyroid Panel With TSH  Hyperglycemia - Pt has new onset Type II DM but prefers to address nutrition, exercise and work on weight loss. She does not want to take medication at this time. I provided her with information and she can research online for TLC guidelines. Plan: POCT glycosylated hemoglobin (Hb A1C), POCT glucose (manual entry)  Unspecified essential hypertension - Start medication- Lisinopril 5 mg 1 tab daily. Pt advised about most common side effect (cough), Plan: Basic metabolic panel  Meds ordered this encounter  Medications  . lisinopril (PRINIVIL,ZESTRIL) 5 MG tablet    Sig: Take 1 tablet (5 mg total) by mouth daily.  Dispense:  30 tablet    Refill:  5

## 2013-09-26 LAB — THYROID PANEL WITH TSH
Free Thyroxine Index: 4.5 — ABNORMAL HIGH (ref 1.0–3.9)
T3 Uptake: 34.3 % (ref 22.5–37.0)
T4 TOTAL: 13 ug/dL — AB (ref 5.0–12.5)
TSH: 0.764 u[IU]/mL (ref 0.350–4.500)

## 2013-09-26 LAB — BASIC METABOLIC PANEL
BUN: 15 mg/dL (ref 6–23)
CALCIUM: 9.3 mg/dL (ref 8.4–10.5)
CO2: 23 mEq/L (ref 19–32)
Chloride: 105 mEq/L (ref 96–112)
Creat: 0.67 mg/dL (ref 0.50–1.10)
Glucose, Bld: 126 mg/dL — ABNORMAL HIGH (ref 70–99)
POTASSIUM: 4.6 meq/L (ref 3.5–5.3)
Sodium: 138 mEq/L (ref 135–145)

## 2013-10-01 ENCOUNTER — Ambulatory Visit: Payer: Self-pay | Admitting: Family Medicine

## 2013-10-02 ENCOUNTER — Ambulatory Visit: Payer: BC Managed Care – PPO | Admitting: Neurology

## 2013-10-09 ENCOUNTER — Ambulatory Visit (INDEPENDENT_AMBULATORY_CARE_PROVIDER_SITE_OTHER): Payer: BC Managed Care – PPO | Admitting: Neurology

## 2013-10-09 ENCOUNTER — Encounter: Payer: Self-pay | Admitting: Neurology

## 2013-10-09 VITALS — BP 171/99 | HR 94 | Temp 98.8°F | Ht 63.0 in | Wt 214.0 lb

## 2013-10-09 DIAGNOSIS — G4733 Obstructive sleep apnea (adult) (pediatric): Secondary | ICD-10-CM

## 2013-10-09 DIAGNOSIS — Z9989 Dependence on other enabling machines and devices: Principal | ICD-10-CM

## 2013-10-09 NOTE — Patient Instructions (Signed)
Please continue using your CPAP regularly. While your insurance requires that you use CPAP at least 4 hours each night on 70% of the nights, I recommend, that you not skip any nights and use it throughout the night if you can. Getting used to CPAP and staying with the treatment long term does take time and patience and discipline. Untreated obstructive sleep apnea when it is moderate to severe can have an adverse impact on cardiovascular health and raise her risk for heart disease, arrhythmias, hypertension, congestive heart failure, stroke and diabetes. Untreated obstructive sleep apnea causes sleep disruption, nonrestorative sleep, and sleep deprivation. This can have an impact on your day to day functioning and cause daytime sleepiness and impairment of cognitive function, memory loss, mood disturbance, and problems focussing. Using CPAP regularly can improve these symptoms.  Keep up the good work! I will see you back in 4 months for sleep apnea check up, and if you continue to do well on CPAP I will see you once a year eventually.  I will go ahead and increase your pressure to 13 cm.

## 2013-10-09 NOTE — Progress Notes (Signed)
Subjective:    Patient ID: Rennee Coyne is a 63 y.o. female.  HPI    Interim history:   Ms. Vankirk is a 63 year old right-handed woman with an underlying medical history of obesity, hypothyroidism, vitamin D deficiency, allergic rhinitis, and smoking, who presents for followup consultation of her obstructive sleep apnea. She is unaccompanied today. I first met her on 07/11/2013 at the request of her primary care physician, at which time she reported snoring, daytime somnolence, multiple nighttime awakenings and nocturia as well as non-restorative sleep. She had recently been diagnosed with hypothyroidism and was in the process of adjusting her meds. She has had elevated BPs and high cholesterol. I suggested she return for sleep study. She has split-night sleep study on 08/14/2013 and went over her test results with her in detail today. Sleep efficiency was reduced at baseline at 60.8% with a latency to sleep of 31.5 minutes and wake after sleep onset of 37.5 minutes with mild sleep fragmentation noted. She had markedly elevated arousal index. She had an elevated percentage of stage II sleep and absence of slow-wave and REM sleep. She had moderate to loud snoring. She had a total AHI of 99.3 per hour. Baseline oxygen saturation was 90%, nadir was 85%. She was then titrated on CPAP. Sleep efficiency was improved at 83.8%. Arousal index was improved. She had an increased percentage of REM sleep at 38.6%, and a fairly normal percentage of deep sleep. She was titrated from 5-10 cm. Her AHI was reduced but her sleep disorder breathing not eliminated. She had evidence of nocturnal hypoxemia. Average oxygen saturation remained around 90%, nadir was 81%. Time below 88% saturation after titration was 25 minutes. Since there was evidence of improvement of her sleep disorder breathing and her sleep architecture I suggested a trial of autoPAP. Today, I reviewed her complaint seizure from 08/26/2013 through 10/08/2013  which is a total of 44 days during which time she used her machine every night except for one, average usage of 6 hours and 38 minutes, pressure setting of 7-13 with a PR of 2 residual AHI 1 which is very good, 95th percentile of the pressure was 12.8, leak was acceptable at 22.2 with the 95th percentile. Her usage above 4hours was 98%, which is excellent.  Today, she reports that she is feeling much better with her sleep consolidation and daytime sleepiness, and she is very pleased with how she is doing. She has been tolerating the pressure and the nasal pillows rather well.  Her typical bedtime is reported to be around 10 to 10:30 PM and usual wake time is around 5:45 AM. Sleep onset typically occurs within a few minutes. She reports feeling marginally rested upon awakening. She wakes up on an average 3 times in the middle of the night and has to go to the bathroom 2 to 3 times on a typical night. She denies morning headaches.  She has been known to snore for the past many years. Snoring is reportedly marked, and unclear is associated with choking sounds or witnessed apneas. She sleeps alone. The patient reports mouth breathing, but does not report a clear sensation of choking or strangling feeling. There is no report of nighttime reflux, with no nighttime cough experienced. The patient has not noted any RLS symptoms and is not known to kick while asleep or before falling asleep. There is no family history known to her, as she was adopted.  She denies cataplexy, sleep paralysis, hypnagogic or hypnopompic hallucinations, or sleep attacks. She  does not report any vivid dreams, nightmares, dream enactments, or parasomnias, such as sleep talking or sleep walking. The patient has not had a sleep study or a home sleep test.   Her Past Medical History Is Significant For: Past Medical History  Diagnosis Date  . Hypothyroidism     Her Past Surgical History Is Significant For: Past Surgical History  Procedure  Laterality Date  . Tubal ligation      Her Family History Is Significant For: Family History  Problem Relation Age of Onset  . Adopted: Yes    Her Social History Is Significant For: History   Social History  . Marital Status: Single    Spouse Name: N/A    Number of Children: N/A  . Years of Education: N/A   Social History Main Topics  . Smoking status: Current Every Day Smoker -- 20.00 packs/day    Types: Cigarettes  . Smokeless tobacco: None  . Alcohol Use: Yes     Comment: ocassionally - wine  . Drug Use: No  . Sexual Activity: Not Currently   Other Topics Concern  . None   Social History Narrative  . None    Her Allergies Are:  No Known Allergies:   Her Current Medications Are:  Outpatient Encounter Prescriptions as of 10/09/2013  Medication Sig  . ergocalciferol (DRISDOL) 50000 UNITS capsule Take 1 capsule (50,000 Units total) by mouth once a week.  . levothyroxine (SYNTHROID, LEVOTHROID) 100 MCG tablet Take 1 tablet (100 mcg total) by mouth daily.  Marland Kitchen lisinopril (PRINIVIL,ZESTRIL) 5 MG tablet Take 1 tablet (5 mg total) by mouth daily.  :  Review of Systems:  Out of a complete 14 point review of systems, all are reviewed and negative with the exception of these symptoms as listed below:  Review of Systems  Constitutional: Negative.   HENT: Negative.   Eyes: Negative.   Respiratory: Negative.   Cardiovascular: Negative.   Gastrointestinal: Negative.   Endocrine: Negative.   Genitourinary: Negative.   Musculoskeletal: Negative.   Skin: Negative.   Allergic/Immunologic: Positive for environmental allergies.  Neurological: Negative.   Hematological: Negative.   Psychiatric/Behavioral: Positive for sleep disturbance (apnea).    Objective:  Neurologic Exam  Physical Exam Physical Examination:   Filed Vitals:   10/09/13 0826  BP: 171/99  Pulse: 94  Temp: 98.8 F (37.1 C)     General Examination: The patient is a very pleasant 63 y.o. female  in no acute distress. She appears well-developed and well-nourished and well groomed. She is in good spirits today.  HEENT: Normocephalic, atraumatic, pupils are equal, round and reactive to light and accommodation. Funduscopic exam is normal with sharp disc margins noted. Extraocular tracking is good without limitation to gaze excursion or nystagmus noted. Normal smooth pursuit is noted. Hearing is grossly intact. Tympanic membranes are clear bilaterally. Face is symmetric with normal facial animation and normal facial sensation. Speech is clear with no dysarthria noted. There is no hypophonia. There is no lip, neck/head, jaw or voice tremor. Neck is supple with full range of passive and active motion. There are no carotid bruits on auscultation. Oropharynx exam reveals: mild mouth dryness, adequate dental hygiene and moderate airway crowding, due to narrow airway and thick uvula. Mallampati is class II. Tongue protrudes centrally and palate elevates symmetrically. Tonsils are absent.   Chest: Clear to auscultation without wheezing, rhonchi or crackles noted.  Heart: S1+S2+0, regular and normal without murmurs, rubs or gallops noted.   Abdomen: Soft, non-tender and  non-distended with normal bowel sounds appreciated on auscultation.  Extremities: There is no pitting edema in the distal lower extremities bilaterally. Pedal pulses are intact.  Skin: Warm and dry without trophic changes noted. There are no varicose veins.  Musculoskeletal: exam reveals no obvious joint deformities, tenderness or joint swelling or erythema.   Neurologically:  Mental status: The patient is awake, alert and oriented in all 4 spheres. Her immediate and remote memory, attention, language skills and fund of knowledge are appropriate. There is no evidence of aphasia, agnosia, apraxia or anomia. Speech is clear with normal prosody and enunciation. Thought process is linear. Mood is normal and affect is normal.  Cranial nerves  II - XII are as described above under HEENT exam. In addition: shoulder shrug is normal with equal shoulder height noted. Motor exam: Normal bulk, strength and tone is noted. There is no drift, tremor or rebound. Romberg is negative. Reflexes are 2+ throughout. Babinski: Toes are flexor bilaterally. Fine motor skills and coordination: intact with normal finger taps, normal hand movements, normal rapid alternating patting, normal foot taps and normal foot agility.  Cerebellar testing: No dysmetria or intention tremor on finger to nose testing. Heel to shin is unremarkable bilaterally. There is no truncal or gait ataxia.  Sensory exam: intact to light touch, pinprick, vibration, temperature sense in the upper and lower extremities.  Gait, station and balance: She stands easily. No veering to one side is noted. No leaning to one side is noted. Posture is age-appropriate and stance is narrow based. Gait shows normal stride length and normal pace. No problems turning are noted. She turns en bloc. Tandem walk is unremarkable. Intact toe and heel stance is noted.             Assessment and Plan:   In summary, Kristyna Bradstreet is a very pleasant 63 y.o.-year old female with an underlying medical history of obesity, hypothyroidism, vitamin D deficiency, allergic rhinitis, and smoking, who presents for followup consultation of her severe obstructive sleep apnea. I talked to her at length about her sleep test report and she is now on AutoPAP and has been doing exceedingly well with it. It looks like her typical pressure requirement is right around 12.8 which is why I suggested that we increase her pressure to set pressure of 13 cm at this time. We talked about her compliance to at length today and she is congratulated on her superb compliance. She feels much improved with her sleep. She is advised strongly to quit smoking and to try to lose weight. This would help her diabetes numbers as well. She follows with Dr.  Leward Quan for this and her thyroid numbers have been stable lately. Her exam is stable and she is reassured in that regard, nevertheless, she did have some drops in her oxygen level even when on CPAP but this was during the sleep study when she was titrated up to 10 cm. We can assume that her oxygenation will be improved on the setting of 13 cm. Nevertheless, when needed down the Road we can also do an overnight pulse oximetry while she is using CPAP. I would like to see her back relatively soon in 3-4 months for reevaluation while she is on the CPAP setting of 13 cm. She was in agreement. She is advised to call with any interim questions.

## 2013-11-07 ENCOUNTER — Ambulatory Visit: Payer: BC Managed Care – PPO | Admitting: Family Medicine

## 2013-11-21 ENCOUNTER — Ambulatory Visit (INDEPENDENT_AMBULATORY_CARE_PROVIDER_SITE_OTHER): Payer: BC Managed Care – PPO | Admitting: Family Medicine

## 2013-11-21 ENCOUNTER — Encounter: Payer: Self-pay | Admitting: Family Medicine

## 2013-11-21 VITALS — BP 160/100 | HR 97 | Temp 98.6°F | Resp 16 | Ht 62.5 in | Wt 213.0 lb

## 2013-11-21 DIAGNOSIS — E039 Hypothyroidism, unspecified: Secondary | ICD-10-CM

## 2013-11-21 DIAGNOSIS — I10 Essential (primary) hypertension: Secondary | ICD-10-CM

## 2013-11-21 DIAGNOSIS — E119 Type 2 diabetes mellitus without complications: Secondary | ICD-10-CM

## 2013-11-21 MED ORDER — LEVOTHYROXINE SODIUM 88 MCG PO TABS: 88.0000 ug | ORAL_TABLET | Freq: Every day | ORAL | Status: DC

## 2013-11-21 MED ORDER — LISINOPRIL 10 MG PO TABS: ORAL_TABLET | ORAL | Status: DC

## 2013-11-21 NOTE — Progress Notes (Signed)
S:  This 63 y.o. Cauc female has HTN and hypothyroidism. Compliance with all prescribed medications is good. Her BP remains elevated but she is asymptomatic. She has improved nutrition and is trying to increase activity at work; she states she cannot afford a fitness facility membership. Pt attributes increased BP to stress; she has some financial concerns since she changed jobs (current employment pays significantly less than previous job).  Patient Active Problem List   Diagnosis Date Noted  . Type II or unspecified type diabetes mellitus without mention of complication, not stated as uncontrolled 09/25/2013  . BMI 38.0-38.9,adult 05/19/2013  . Nicotine addiction 05/19/2013  . Hypersomnolence 05/19/2013  . Elevated blood pressure 05/19/2013  . Right atrial enlargement 05/19/2013    Prior to Admission medications   Medication Sig Start Date End Date Taking? Authorizing Provider  ergocalciferol (DRISDOL) 50000 UNITS capsule Take 1 capsule (50,000 Units total) by mouth once a week. 07/03/13 07/03/14 Yes Barton Fanny, MD  lisinopril (PRINIVIL,ZESTRIL) 5 MG tablet Take 1 tablet by mouth daily to lower blood pressure.   Yes Barton Fanny, MD  levothyroxine (SYNTHROID, LEVOTHROID) 100 MCG tablet Take 1 tablet ( 100 mcg total) by mouth daily.    Barton Fanny, MD   History   Social History  . Marital Status: Single    Spouse Name: N/A    Number of Children: N/A  . Years of Education: N/A   Occupational History  . Not on file.   Social History Main Topics  . Smoking status: Current Every Day Smoker -- 20.00 packs/day    Types: Cigarettes  . Smokeless tobacco: Not on file  . Alcohol Use: Yes     Comment: ocassionally - wine  . Drug Use: No  . Sexual Activity: Not Currently   Other Topics Concern  . Not on file   Social History Narrative  . Works for [employer]    Family History  Problem Relation Age of Onset  . Adopted: Yes    ROS: Negative for abnormal  weight loss, diaphoresis, fatigue, vision disturbances, CP or tightness, palpitations, SOB or DOE, edema, HA, dizziness, numbness, weakness or syncope. No agitation, behavioral change, anxiety, dysphoric mood or sleep disturbance.  O: Filed Vitals:   11/21/13 1619  BP: 160/100  Pulse: 97  Temp: 98.6 F (37 C)  Resp: 16   GEN: In NAD; WN,WD. Weight down 4 lbs in 2 months. HENT: [place]/AT; EOMI w/ clear conj/sclerae. Otherwise unremarkable. COR: RRR. No edema. LUNGS: Unlabored resp. SKIN: W&D; intact w/o diaphoresis, erythema or pallor. NEURO: A&O x 3; CNs intact. Nonfocal.  A/P: Unspecified hypothyroidism -  Reduce dose of Levothyroxine to 88 mcg daily. Recheck thyroid labs in 6 -9 weeks.Maybe able to have labs drawn at clinic at workplace. Plan: Thyroid Panel With TSH  Essential hypertension - Increase Lisinopril to 10 mg; monitor BP daily. Pt reports addition of health clinic at her workplace; she may be able to have labs drawn there. Plan: Basic metabolic panel  Type II or unspecified type diabetes mellitus without mention of complication, not stated as uncontrolled - Plan: [facility] Diabetes Foot Exam  Meds ordered this encounter  Medications  . levothyroxine (SYNTHROID, LEVOTHROID) 88 MCG tablet    Sig: Take 1 tablet (88 mcg total) by mouth daily.    Dispense:  30 tablet    Refill:  3  . lisinopril (PRINIVIL,ZESTRIL) 10 MG tablet    Sig: Take 1 tablet by mouth daily to lower blood pressure.  Dispense:  30 tablet    Refill:  5   I will write a letter for pt to present at [facility] for discounted membership; she will check w/ [employer] to see if her plan has fitness membership benefit.

## 2013-11-21 NOTE — Patient Instructions (Signed)

## 2013-12-11 NOTE — Progress Notes (Signed)
Quick Note:  I reviewed the patient's CPAP compliance data from 11/24/2013 to 11/26/2013, which is a total of 30 days, during which time the patient used CPAP every day. The average usage for all days was 7 hours and 6 minutes. The percent used days greater than 4 hours was 100 %, indicating superb compliance. The residual AHI was 0.5 per hour, indicating an appropriate treatment pressure of 13 cwp with EPR of 2. Air leak from the mask was at times high with the 95th percentile at 27.6 L per minute. She may need a new mask or a mask refit. I will review this data with the patient at the next office visit, which is currently routinely scheduled for 04/03/2014 at 3:30 PM, provide feedback and additional troubleshooting if need be.  Star Age, MD, PhD [facility] ([facility]   ______

## 2013-12-12 ENCOUNTER — Ambulatory Visit (INDEPENDENT_AMBULATORY_CARE_PROVIDER_SITE_OTHER): Payer: BC Managed Care – PPO | Admitting: Emergency Medicine

## 2013-12-12 VITALS — BP 116/74 | HR 84 | Temp 98.2°F | Resp 20 | Ht 62.0 in | Wt 206.0 lb

## 2013-12-12 DIAGNOSIS — J209 Acute bronchitis, unspecified: Secondary | ICD-10-CM

## 2013-12-12 MED ORDER — ALBUTEROL SULFATE HFA 108 (90 BASE) MCG/ACT IN AERS: 2.0000 | INHALATION_SPRAY | RESPIRATORY_TRACT | Status: DC | PRN

## 2013-12-12 NOTE — Patient Instructions (Signed)
Metered Dose Inhaler (No Spacer Used)  Inhaled medicines are the basis of treatment for asthma and other breathing problems. Inhaled medicine can only be effective if used properly. Good technique assures that the medicine reaches the lungs.  Metered dose inhalers (MDIs) are used to deliver a variety of inhaled medicines. These include quick relief or rescue medicines (such as bronchodilators) and controller medicines (such as corticosteroids). The medicine is delivered by pushing down on a metal canister to release a set amount of spray.  If you are using different kinds of inhalers, use your quick relief medicine to open the airways 10-15 minutes before using a steroid, if instructed to do so by your health care provider. If you are unsure which inhalers to use and the order of using them, ask your health care provider, nurse, or respiratory therapist.  HOW TO USE THE INHALER  1. Remove the cap from the inhaler.  2. If you are using the inhaler for the first time, you will need to prime it. Shake the inhaler for 5 seconds and release four puffs into the air, away from your face. Ask your health care provider or pharmacist if you have questions about priming your inhaler.  3. Shake the inhaler for 5 seconds before each breath in (inhalation).  4. Position the inhaler so that the top of the canister faces up.  5. Put your index finger on the top of the medicine canister. Your thumb supports the bottom of the inhaler.  6. Open your mouth.  7. Either place the inhaler between your teeth and place your lips tightly around the mouthpiece, or hold the inhaler 1-2 inches away from your open mouth. If you are unsure of which technique to use, ask your health care provider.  8. Breathe out (exhale) normally and as completely as possible.  9. Press the canister down with the index finger to release the medicine.  10. At the same time as the canister is pressed, inhale deeply and slowly until your lungs are completely filled.  This should take 4-6 seconds. Keep your tongue down.  11. Hold the medicine in your lungs for 5-10 seconds (10 seconds is best). This helps the medicine get into the small airways of your lungs.  12. Breathe out slowly, through pursed lips. Whistling is an example of pursed lips.  13. Wait at least 1 minute between puffs. Continue with the above steps until you have taken the number of puffs your health care provider has ordered. Do not use the inhaler more than your health care provider directs you to.  14. Replace the cap on the inhaler.  15. Follow the directions from your health care provider or the inhaler insert for cleaning the inhaler.  If you are using a steroid inhaler, after your last puff, rinse your mouth with water, gargle, and spit out the water. Do not swallow the water.  AVOID:  · Inhaling before or after starting the spray of medicine. It takes practice to coordinate your breathing with triggering the spray.  · Inhaling through the nose (rather than the mouth) when triggering the spray.  HOW TO DETERMINE IF YOUR INHALER IS FULL OR NEARLY EMPTY  You cannot know when an inhaler is empty by shaking it. Some inhalers are now being made with dose counters. Ask your health care provider for a prescription that has a dose counter if you feel you need that extra help. If your inhaler does not have a counter, ask your health care   provider to help you determine the date you need to refill your inhaler. Write the refill date on a calendar or your inhaler canister. Refill your inhaler 7-10 days before it runs out. Be sure to keep an adequate supply of medicine. This includes making sure it has not expired, and making sure you have a spare inhaler.  SEEK MEDICAL CARE IF:  · Symptoms are only partially relieved with your inhaler.  · You are having trouble using your inhaler.  · You experience an increase in phlegm.  SEEK IMMEDIATE MEDICAL CARE IF:  · You feel little or no relief with your inhalers. You are still  wheezing and feeling shortness of breath, tightness in your chest, or both.  · You have dizziness, headaches, or a fast heart rate.  · You have chills, fever, or night sweats.  · There is a noticeable increase in phlegm production, or there is blood in the phlegm.  MAKE SURE YOU:  · Understand these instructions.  · Will watch your condition.  · Will get help right away if you are not doing well or get worse.  Document Released: 01/09/2007 Document Revised: 07/29/2013 Document Reviewed: 08/30/2012  ExitCare® Patient Information ©2015 [employer]. This information is not intended to replace advice given to you by your health care provider. Make sure you discuss any questions you have with your health care provider.

## 2013-12-12 NOTE — Progress Notes (Signed)
[facility] [address], [place] [address] [phone]  Date:  12/12/2013   Name:  Carla Hopkins   DOB:  Jul 23, 1950   MRN:  [id number]  PCP:  Ellsworth Lennox, MD    Chief Complaint: Wheezing and Nasal Congestion   History of Present Illness:  Carla Hopkins is a 63 y.o. very pleasant female patient who presents with the following:  5 day history of "upper respiratory infection".   Sent by employee health nurse.  Has a cough and shortness of breath.  No wheezing.   Has nasal congestion and drainage that is mucoid.  Has mucopurulent sputum.   No fever or chills. No provocative factor for cough In smoking cessation program No sore throat.  No nausea or vomiting. No stool change  No rash. No improvement with over the counter medications or other home remedies. Denies other complaint or health concern today.   Patient Active Problem List   Diagnosis Date Noted  . Type II or unspecified type diabetes mellitus without mention of complication, not stated as uncontrolled 09/25/2013  . BMI 38.0-38.9,adult 05/19/2013  . Nicotine addiction 05/19/2013  . Hypersomnolence 05/19/2013  . Elevated blood pressure 05/19/2013  . Right atrial enlargement 05/19/2013    Past Medical History  Diagnosis Date  . Hypothyroidism     Past Surgical History  Procedure Laterality Date  . Tubal ligation      History  Substance Use Topics  . Smoking status: Current Some Day Smoker -- 20.00 packs/day    Types: Cigarettes  . Smokeless tobacco: Not on file  . Alcohol Use: Yes     Comment: ocassionally - wine    Family History  Problem Relation Age of Onset  . Adopted: Yes    No Known Allergies  Medication list has been reviewed and updated.  Current Outpatient Prescriptions on File Prior to Visit  Medication Sig Dispense Refill  . ergocalciferol (DRISDOL) 50000 UNITS capsule Take 1 capsule (50,000 Units total) by mouth once a week.  4 capsule  5  .  levothyroxine (SYNTHROID, LEVOTHROID) 88 MCG tablet Take 1 tablet (88 mcg total) by mouth daily.  30 tablet  3  . lisinopril (PRINIVIL,ZESTRIL) 10 MG tablet Take 1 tablet by mouth daily to lower blood pressure.  30 tablet  5   No current facility-administered medications on file prior to visit.    Review of Systems:  As per HPI, otherwise negative.    Physical Examination: Filed Vitals:   12/12/13 1318  BP: 116/74  Pulse: 84  Temp: 98.2 F (36.8 C)  Resp: 20   Filed Vitals:   12/12/13 1318  Height: 5\' 2"  (1.575 m)  Weight: 206 lb (93.441 kg)   Body mass index is 37.67 kg/(m^2). Ideal Body Weight: Weight in (lb) to have BMI = 25: 136.4  GEN: obese, NAD, Non-toxic, A & O x 3 HEENT: Atraumatic, Normocephalic. Neck supple. No masses, No LAD. Ears and Nose: No external deformity. CV: RRR, No M/G/R. No JVD. No thrill. No extra heart sounds. PULM: CTA B, diffuse mild wheezes, no crackles, rhonchi. No retractions. No resp. distress. No accessory muscle use. ABD: S, NT, ND, +BS. No rebound. No HSM. EXTR: No c/c/e NEURO Normal gait.  PSYCH: Normally interactive. Conversant. Not depressed or anxious appearing.  Calm demeanor.    Assessment and Plan: Acute bronchitis Albuterol   Signed,  Ellison Carwin, MD

## 2013-12-27 ENCOUNTER — Telehealth: Payer: Self-pay

## 2013-12-27 NOTE — Telephone Encounter (Signed)
Clld pt - Antigo on cell to schedule Diabetic Care Follow up.

## 2014-03-22 ENCOUNTER — Other Ambulatory Visit: Payer: Self-pay | Admitting: Family Medicine

## 2014-04-03 ENCOUNTER — Ambulatory Visit: Payer: BC Managed Care – PPO | Admitting: Neurology

## 2014-05-20 ENCOUNTER — Other Ambulatory Visit: Payer: Self-pay | Admitting: Family Medicine

## 2014-06-17 ENCOUNTER — Other Ambulatory Visit: Payer: Self-pay | Admitting: Family Medicine

## 2014-07-03 ENCOUNTER — Encounter: Payer: Self-pay | Admitting: Family Medicine

## 2014-07-03 ENCOUNTER — Ambulatory Visit (INDEPENDENT_AMBULATORY_CARE_PROVIDER_SITE_OTHER): Payer: BLUE CROSS/BLUE SHIELD | Admitting: Family Medicine

## 2014-07-03 VITALS — BP 160/100 | HR 97 | Temp 98.5°F | Resp 16 | Ht 62.5 in | Wt 215.0 lb

## 2014-07-03 DIAGNOSIS — E119 Type 2 diabetes mellitus without complications: Secondary | ICD-10-CM | POA: Diagnosis not present

## 2014-07-03 DIAGNOSIS — E034 Atrophy of thyroid (acquired): Secondary | ICD-10-CM

## 2014-07-03 DIAGNOSIS — E559 Vitamin D deficiency, unspecified: Secondary | ICD-10-CM

## 2014-07-03 DIAGNOSIS — E038 Other specified hypothyroidism: Secondary | ICD-10-CM | POA: Diagnosis not present

## 2014-07-03 DIAGNOSIS — IMO0001 Reserved for inherently not codable concepts without codable children: Secondary | ICD-10-CM

## 2014-07-03 DIAGNOSIS — R030 Elevated blood-pressure reading, without diagnosis of hypertension: Secondary | ICD-10-CM

## 2014-07-03 DIAGNOSIS — E039 Hypothyroidism, unspecified: Secondary | ICD-10-CM | POA: Insufficient documentation

## 2014-07-03 LAB — BASIC METABOLIC PANEL
BUN: 19 mg/dL (ref 6–23)
CALCIUM: 9 mg/dL (ref 8.4–10.5)
CO2: 21 mEq/L (ref 19–32)
Chloride: 105 mEq/L (ref 96–112)
Creat: 0.75 mg/dL (ref 0.50–1.10)
Glucose, Bld: 123 mg/dL — ABNORMAL HIGH (ref 70–99)
POTASSIUM: 4.3 meq/L (ref 3.5–5.3)
SODIUM: 139 meq/L (ref 135–145)

## 2014-07-03 LAB — HEMOGLOBIN A1C
Hgb A1c MFr Bld: 7.9 % — ABNORMAL HIGH (ref <5.7)
Mean Plasma Glucose: 180 mg/dL — ABNORMAL HIGH (ref <117)

## 2014-07-03 LAB — THYROID PANEL WITH TSH
Free Thyroxine Index: 2.5 (ref 1.4–3.8)
T3 Uptake: 28 % (ref 22–35)
T4 TOTAL: 9 ug/dL (ref 4.5–12.0)
TSH: 1.697 u[IU]/mL (ref 0.350–4.500)

## 2014-07-03 MED ORDER — ERGOCALCIFEROL 1.25 MG (50000 UT) PO CAPS: 50000.0000 [IU] | ORAL_CAPSULE | ORAL | Status: DC

## 2014-07-03 MED ORDER — LISINOPRIL-HYDROCHLOROTHIAZIDE 20-12.5 MG PO TABS: 1.0000 | ORAL_TABLET | Freq: Every day | ORAL | Status: DC

## 2014-07-03 NOTE — Patient Instructions (Addendum)
"  Eat Fat, Get Thin"- Dr. Rosita Kea. This is a good book about healthy nutrition for weight loss.  AYR- this is an over-the-count saline nasal mist.  Get generic Zyrtec and take 1 tablet in the evening. This is the best time to take allergy medication.

## 2014-07-04 LAB — VITAMIN D 25 HYDROXY (VIT D DEFICIENCY, FRACTURES): Vit D, 25-Hydroxy: 14 ng/mL — ABNORMAL LOW (ref 30–100)

## 2014-07-08 ENCOUNTER — Encounter: Payer: Self-pay | Admitting: Family Medicine

## 2014-07-08 MED ORDER — LEVOTHYROXINE SODIUM 88 MCG PO TABS: 88.0000 ug | ORAL_TABLET | Freq: Every day | ORAL | Status: DC

## 2014-07-08 NOTE — Progress Notes (Signed)
S:  This 64 y.o. Female has HTN and Type II DM without complications. She is compliant w/ medications but not fully committed to lifestyle and nutrition changes. Pt missed f/u OV in late Aug or early Sept 2015. Last A1c= 7.0%. FSBS average 150- 180; no hypoglycemia or BS> 200. She has hypothyroidism and takes medication as directed. She denies fatigue but struggles w/ weight. Pt seen at [facility] in July 2015 for eval of sleep apnea; she is on CPAP. Pressure was increased to 13 mm H2O.  Patient Active Problem List   Diagnosis Date Noted  . Hypothyroidism 07/03/2014  . Type II or unspecified type diabetes mellitus without mention of complication, not stated as uncontrolled 09/25/2013  . BMI 38.0-38.9,adult 05/19/2013  . Nicotine addiction 05/19/2013  . Hypersomnolence 05/19/2013  . Elevated blood pressure 05/19/2013  . Right atrial enlargement 05/19/2013    Prior to Admission medications   Medication Sig Start Date End Date Taking? Authorizing Provider  levothyroxine (SYNTHROID, LEVOTHROID) 88 MCG tablet Take 1 tablet (88 mcg total) by mouth daily.   Yes Barton Fanny, MD  albuterol (PROVENTIL HFA;VENTOLIN HFA) 108 (90 BASE) MCG/ACT inhaler Inhale 2 puffs into the lungs every 4 (four) hours as needed for wheezing or shortness of breath (cough, shortness of breath or wheezing.). Patient not taking: Reported on 07/03/2014 12/12/13   Roselee Culver, MD  lisinopril (ZESTRIL) 10 MG per tablet Take 1 tablet by mouth daily.    Barton Fanny, MD   SURG, SOC and FAM HX reviewed.  ROS: As per HPI. Noncontributory. Negative for diaphoresis, CP or tightness, palpitations, SOB or DOE, edema, GI problems, HA, dizziness, numbness, weakness or syncope.  O: Filed Vitals:   07/03/14 1647  BP: 160/100  Pulse:   Temp:   Resp:    Blood pressure 160/100, pulse 97, temperature 98.5 F (36.9 C), temperature source Oral, resp. rate 16, height 5' 2.5" (1.588 m), weight 215 lb (97.523 kg), SpO2 95  %.  GEN: In NAD: WN,WD.  HENT: [place]/AT. EOMI w/ clear conj/sclerae. Otherwise unremarkable. COR: RRR. No edema. LUNGS: Unlabored resp. SKIN; W&D; intact w/o erythema or pallor. NEURO: A&O x 3; CNs intact. Nonfocal.  A/P: Elevated blood pressure- Change BP med to lisinopril- HCTZ 20-12.5 mg 1 tablet daily. Encouraged weight loss and better nutrition; use CPAP as directed by Dr. Rexene Alberts.  Diabetes mellitus without complication - Labs sent out; will result to pt w/ med change if warranted. Continue current medications. Plan: Basic metabolic panel, Hemoglobin A1c  Hypothyroidism due to acquired atrophy of thyroid - No medication change at this time. Plan: Thyroid Panel With TSH Cont Vit D 50000 units once a week. Recheck in ~12 weeks. Vitamin D deficiency - Plan: Vitamin D, 25-hydroxy  Meds ordered this encounter  Medications  . lisinopril-hydrochlorothiazide (ZESTORETIC) 20-12.5 MG per tablet    Sig: Take 1 tablet by mouth daily.    Dispense:  90 tablet    Refill:  1  . ergocalciferol (DRISDOL) 50000 UNITS capsule    Sig: Take 1 capsule (50,000 Units total) by mouth once a week.    Dispense:  12 capsule    Refill:  1  . levothyroxine (SYNTHROID, LEVOTHROID) 88 MCG tablet    Sig: Take 1 tablet (88 mcg total) by mouth daily.    Dispense:  90 tablet    Refill:  1

## 2014-07-21 ENCOUNTER — Telehealth: Payer: Self-pay

## 2014-07-21 NOTE — Telephone Encounter (Signed)
Pt states the LISINOPRIL she was given is causing her to have weird feelings, she isn't feeling like herself and didn't know if it could be a reaction. Please call pt at [phone] ext [phone]

## 2014-07-22 NOTE — Telephone Encounter (Signed)
Left message for pt to call back to get details on symptoms.

## 2014-07-23 ENCOUNTER — Encounter: Payer: Self-pay | Admitting: Family Medicine

## 2014-07-23 ENCOUNTER — Ambulatory Visit (INDEPENDENT_AMBULATORY_CARE_PROVIDER_SITE_OTHER): Payer: BLUE CROSS/BLUE SHIELD | Admitting: Family Medicine

## 2014-07-23 VITALS — BP 140/90 | HR 80 | Temp 97.9°F | Resp 16 | Ht 62.25 in | Wt 210.2 lb

## 2014-07-23 DIAGNOSIS — G4733 Obstructive sleep apnea (adult) (pediatric): Secondary | ICD-10-CM | POA: Insufficient documentation

## 2014-07-23 DIAGNOSIS — I10 Essential (primary) hypertension: Secondary | ICD-10-CM

## 2014-07-23 MED ORDER — METOPROLOL SUCCINATE ER 25 MG PO TB24: ORAL_TABLET | ORAL | Status: DC

## 2014-07-23 NOTE — Patient Instructions (Signed)
LISINOPRIL- HCTZ  20-12.5 mg   Take 1/2 tablet daily. Metoprolol succinate 25 mg  Take 1/2 tablet once a day. Both medications are to lower your blood pressure.

## 2014-07-23 NOTE — Telephone Encounter (Signed)
lmom to cb. 

## 2014-07-23 NOTE — Progress Notes (Signed)
S:  This 64 y.o. female has HTN, treated w/ Lisinopril- HCTZ 20-12.5 mg 1 tab daily. This medication has been increased x 2 weeks and now pt c/o HA,shakiness and weakness. Appetite is decreased. HA is frontal w/o diplopia or blurred vision. Pt did not have these symptoms on lower dose of Lisinopril 10 mg. She denies diaphoresis, CP or tightness, palpitations, SOB or DOE, cough, GI upset, numbness or syncope.  Pt is waiting for BP cuff to arrive in the mail.  Patient Active Problem List   Diagnosis Date Noted  . Hypothyroidism 07/03/2014  . Type II or unspecified type diabetes mellitus without mention of complication, not stated as uncontrolled 09/25/2013  . BMI 38.0-38.9,adult 05/19/2013  . Nicotine addiction 05/19/2013  . Hypersomnolence 05/19/2013  . Elevated blood pressure 05/19/2013  . Right atrial enlargement 05/19/2013   Prior to Admission medications   Medication Sig Start Date End Date Taking? Authorizing Provider  ergocalciferol (DRISDOL) 50000 UNITS capsule Take 1 capsule (50,000 Units total) by mouth once a week. 07/03/14 07/03/15 Yes Barton Fanny, MD  levothyroxine (SYNTHROID, LEVOTHROID) 88 MCG tablet Take 1 tablet (88 mcg total) by mouth daily. 07/08/14  Yes Barton Fanny, MD  lisinopril-hydrochlorothiazide (ZESTORETIC) 20-12.5 MG per tablet Take 1 tablet by mouth daily. 07/03/14  Yes Barton Fanny, MD  albuterol (PROVENTIL HFA;VENTOLIN HFA) 108 (90 BASE) MCG/ACT inhaler Inhale 2 puffs into the lungs every 4 (four) hours as needed for wheezing or shortness of breath (cough, shortness of breath or wheezing.). Patient not taking: Reported on 07/03/2014 12/12/13   Roselee Culver, MD    SURG, SOC and FAM HX reviewed.  ROS: As per HPI.  O: Filed Vitals:   07/23/14 1306  BP: 140/90  Pulse: 80  Temp:   Resp:    GEN: In NAD; WN,WD. HENT: [place]/AT; EOMI w/ clear conj/sclerae. Otherwise unremarkable. COR: RRR. Normal S1 and S2. No m/g/r. LUNGS: Normal resp rate and  effort. SKIN; W&D; slightly ruddy complexion. NEURO: A&O x 3; CNs intact. Nonfocal.  A/P: Benign essential HTN-  Reduce Lisinopril- HCTZ 20-12.5 mg to 1/2 tablet daily. Add Metoprolol succinate 25 mg  1/2 tablet daily.  Monitor BP daily when cuff available/ RTC in 2 weeks.

## 2014-07-24 ENCOUNTER — Emergency Department (HOSPITAL_COMMUNITY): Payer: BLUE CROSS/BLUE SHIELD

## 2014-07-24 ENCOUNTER — Inpatient Hospital Stay (HOSPITAL_COMMUNITY)
Admission: EM | Admit: 2014-07-24 | Discharge: 2014-07-30 | DRG: 065 | Disposition: A | Discharge status: Rehabilitation Facility | Payer: BLUE CROSS/BLUE SHIELD | Attending: Family Medicine | Admitting: Family Medicine

## 2014-07-24 ENCOUNTER — Encounter (HOSPITAL_COMMUNITY): Payer: Self-pay | Admitting: Emergency Medicine

## 2014-07-24 DIAGNOSIS — R531 Weakness: Secondary | ICD-10-CM | POA: Diagnosis present

## 2014-07-24 DIAGNOSIS — G4733 Obstructive sleep apnea (adult) (pediatric): Secondary | ICD-10-CM | POA: Diagnosis present

## 2014-07-24 DIAGNOSIS — E669 Obesity, unspecified: Secondary | ICD-10-CM | POA: Diagnosis present

## 2014-07-24 DIAGNOSIS — R471 Dysarthria and anarthria: Secondary | ICD-10-CM | POA: Diagnosis present

## 2014-07-24 DIAGNOSIS — E119 Type 2 diabetes mellitus without complications: Secondary | ICD-10-CM | POA: Diagnosis present

## 2014-07-24 DIAGNOSIS — R21 Rash and other nonspecific skin eruption: Secondary | ICD-10-CM | POA: Diagnosis present

## 2014-07-24 DIAGNOSIS — Z7951 Long term (current) use of inhaled steroids: Secondary | ICD-10-CM | POA: Diagnosis not present

## 2014-07-24 DIAGNOSIS — R911 Solitary pulmonary nodule: Secondary | ICD-10-CM | POA: Diagnosis present

## 2014-07-24 DIAGNOSIS — E039 Hypothyroidism, unspecified: Secondary | ICD-10-CM | POA: Insufficient documentation

## 2014-07-24 DIAGNOSIS — E785 Hyperlipidemia, unspecified: Secondary | ICD-10-CM | POA: Diagnosis not present

## 2014-07-24 DIAGNOSIS — E1159 Type 2 diabetes mellitus with other circulatory complications: Secondary | ICD-10-CM | POA: Diagnosis not present

## 2014-07-24 DIAGNOSIS — W1830XA Fall on same level, unspecified, initial encounter: Secondary | ICD-10-CM | POA: Diagnosis present

## 2014-07-24 DIAGNOSIS — R748 Abnormal levels of other serum enzymes: Secondary | ICD-10-CM | POA: Diagnosis present

## 2014-07-24 DIAGNOSIS — D72829 Elevated white blood cell count, unspecified: Secondary | ICD-10-CM | POA: Diagnosis present

## 2014-07-24 DIAGNOSIS — F17210 Nicotine dependence, cigarettes, uncomplicated: Secondary | ICD-10-CM | POA: Diagnosis present

## 2014-07-24 DIAGNOSIS — I63411 Cerebral infarction due to embolism of right middle cerebral artery: Secondary | ICD-10-CM

## 2014-07-24 DIAGNOSIS — I10 Essential (primary) hypertension: Secondary | ICD-10-CM | POA: Insufficient documentation

## 2014-07-24 DIAGNOSIS — G8104 Flaccid hemiplegia affecting left nondominant side: Secondary | ICD-10-CM | POA: Diagnosis present

## 2014-07-24 DIAGNOSIS — I6529 Occlusion and stenosis of unspecified carotid artery: Secondary | ICD-10-CM | POA: Insufficient documentation

## 2014-07-24 DIAGNOSIS — M6282 Rhabdomyolysis: Secondary | ICD-10-CM | POA: Diagnosis present

## 2014-07-24 DIAGNOSIS — Z79899 Other long term (current) drug therapy: Secondary | ICD-10-CM

## 2014-07-24 DIAGNOSIS — I6789 Other cerebrovascular disease: Secondary | ICD-10-CM | POA: Diagnosis not present

## 2014-07-24 DIAGNOSIS — R778 Other specified abnormalities of plasma proteins: Secondary | ICD-10-CM | POA: Diagnosis present

## 2014-07-24 DIAGNOSIS — I639 Cerebral infarction, unspecified: Secondary | ICD-10-CM | POA: Diagnosis present

## 2014-07-24 DIAGNOSIS — M6289 Other specified disorders of muscle: Secondary | ICD-10-CM | POA: Diagnosis not present

## 2014-07-24 DIAGNOSIS — R1310 Dysphagia, unspecified: Secondary | ICD-10-CM | POA: Diagnosis present

## 2014-07-24 DIAGNOSIS — I6521 Occlusion and stenosis of right carotid artery: Secondary | ICD-10-CM | POA: Diagnosis not present

## 2014-07-24 DIAGNOSIS — Z720 Tobacco use: Secondary | ICD-10-CM | POA: Diagnosis present

## 2014-07-24 DIAGNOSIS — R7989 Other specified abnormal findings of blood chemistry: Secondary | ICD-10-CM | POA: Diagnosis present

## 2014-07-24 DIAGNOSIS — Z6837 Body mass index (BMI) 37.0-37.9, adult: Secondary | ICD-10-CM

## 2014-07-24 DIAGNOSIS — I63311 Cerebral infarction due to thrombosis of right middle cerebral artery: Secondary | ICD-10-CM | POA: Insufficient documentation

## 2014-07-24 HISTORY — DX: Essential (primary) hypertension: I10

## 2014-07-24 LAB — PROTIME-INR
INR: 0.97 (ref 0.00–1.49)
Prothrombin Time: 13 seconds (ref 11.6–15.2)

## 2014-07-24 LAB — COMPREHENSIVE METABOLIC PANEL
ALBUMIN: 3.6 g/dL (ref 3.5–5.2)
ALK PHOS: 89 U/L (ref 39–117)
ALT: 26 U/L (ref 0–35)
ANION GAP: 11 (ref 5–15)
AST: 42 U/L — ABNORMAL HIGH (ref 0–37)
BUN: 18 mg/dL (ref 6–23)
CALCIUM: 9.4 mg/dL (ref 8.4–10.5)
CO2: 24 mmol/L (ref 19–32)
Chloride: 103 mmol/L (ref 96–112)
Creatinine, Ser: 0.88 mg/dL (ref 0.50–1.10)
GFR calc non Af Amer: 68 mL/min — ABNORMAL LOW (ref >90)
GFR, EST AFRICAN AMERICAN: 79 mL/min — AB (ref >90)
GLUCOSE: 196 mg/dL — AB (ref 70–99)
Potassium: 3.7 mmol/L (ref 3.5–5.1)
SODIUM: 138 mmol/L (ref 135–145)
TOTAL PROTEIN: 7.5 g/dL (ref 6.0–8.3)
Total Bilirubin: 0.6 mg/dL (ref 0.3–1.2)

## 2014-07-24 LAB — I-STAT CHEM 8, ED
BUN: 22 mg/dL (ref 6–23)
CALCIUM ION: 1.19 mmol/L (ref 1.13–1.30)
CREATININE: 0.8 mg/dL (ref 0.50–1.10)
Chloride: 103 mmol/L (ref 96–112)
Glucose, Bld: 197 mg/dL — ABNORMAL HIGH (ref 70–99)
HCT: 54 % — ABNORMAL HIGH (ref 36.0–46.0)
HEMOGLOBIN: 18.4 g/dL — AB (ref 12.0–15.0)
Potassium: 3.7 mmol/L (ref 3.5–5.1)
SODIUM: 139 mmol/L (ref 135–145)
TCO2: 21 mmol/L (ref 0–100)

## 2014-07-24 LAB — RAPID URINE DRUG SCREEN, HOSP PERFORMED
Amphetamines: NOT DETECTED
Barbiturates: NOT DETECTED
Benzodiazepines: NOT DETECTED
Cocaine: NOT DETECTED
Opiates: NOT DETECTED
Tetrahydrocannabinol: NOT DETECTED

## 2014-07-24 LAB — CBC
HEMATOCRIT: 47.4 % — AB (ref 36.0–46.0)
HEMOGLOBIN: 16.1 g/dL — AB (ref 12.0–15.0)
MCH: 29.1 pg (ref 26.0–34.0)
MCHC: 34 g/dL (ref 30.0–36.0)
MCV: 85.6 fL (ref 78.0–100.0)
Platelets: 319 10*3/uL (ref 150–400)
RBC: 5.54 MIL/uL — AB (ref 3.87–5.11)
RDW: 15.4 % (ref 11.5–15.5)
WBC: 18.9 10*3/uL — ABNORMAL HIGH (ref 4.0–10.5)

## 2014-07-24 LAB — DIFFERENTIAL
Basophils Absolute: 0 10*3/uL (ref 0.0–0.1)
Basophils Relative: 0 % (ref 0–1)
EOS PCT: 0 % (ref 0–5)
Eosinophils Absolute: 0 10*3/uL (ref 0.0–0.7)
LYMPHS ABS: 1.4 10*3/uL (ref 0.7–4.0)
LYMPHS PCT: 7 % — AB (ref 12–46)
MONOS PCT: 8 % (ref 3–12)
Monocytes Absolute: 1.5 10*3/uL — ABNORMAL HIGH (ref 0.1–1.0)
Neutro Abs: 16.1 10*3/uL — ABNORMAL HIGH (ref 1.7–7.7)
Neutrophils Relative %: 85 % — ABNORMAL HIGH (ref 43–77)

## 2014-07-24 LAB — URINALYSIS, ROUTINE W REFLEX MICROSCOPIC
BILIRUBIN URINE: NEGATIVE
Glucose, UA: 100 mg/dL — AB
Hgb urine dipstick: NEGATIVE
KETONES UR: 15 mg/dL — AB
Leukocytes, UA: NEGATIVE
Nitrite: NEGATIVE
PH: 5.5 (ref 5.0–8.0)
PROTEIN: NEGATIVE mg/dL
Specific Gravity, Urine: 1.023 (ref 1.005–1.030)
Urobilinogen, UA: 0.2 mg/dL (ref 0.0–1.0)

## 2014-07-24 LAB — CK: Total CK: 510 U/L — ABNORMAL HIGH (ref 7–177)

## 2014-07-24 LAB — ETHANOL: Alcohol, Ethyl (B): <5 mg/dL (ref 0–9)

## 2014-07-24 LAB — I-STAT TROPONIN, ED: Troponin i, poc: 1.82 ng/mL (ref 0.00–0.08)

## 2014-07-24 LAB — APTT: aPTT: 27 seconds (ref 24–37)

## 2014-07-24 LAB — TROPONIN I: Troponin I: 4.17 ng/mL (ref <0.031)

## 2014-07-24 MED ORDER — ASPIRIN 325 MG PO TABS: 325.0000 mg | ORAL_TABLET | Freq: Once | ORAL | Status: DC

## 2014-07-24 MED ORDER — LEVOTHYROXINE SODIUM 88 MCG PO TABS
88.0000 ug | ORAL_TABLET | Freq: Every day | ORAL | Status: DC
  Administered 2014-07-25 – 2014-07-30 (×6): 88 ug via ORAL
  Filled 2014-07-24 (×7): qty 1

## 2014-07-24 MED ORDER — CLOPIDOGREL BISULFATE 300 MG PO TABS
300.0000 mg | ORAL_TABLET | Freq: Every day | ORAL | Status: DC
  Filled 2014-07-24: qty 1

## 2014-07-24 MED ORDER — STROKE: EARLY STAGES OF RECOVERY BOOK
Freq: Once | Status: AC
  Administered 2014-07-25: 1
  Filled 2014-07-24: qty 1

## 2014-07-24 MED ORDER — CLOPIDOGREL BISULFATE 75 MG PO TABS
75.0000 mg | ORAL_TABLET | Freq: Every day | ORAL | Status: DC
  Filled 2014-07-24: qty 1

## 2014-07-24 MED ORDER — ENOXAPARIN SODIUM 40 MG/0.4ML ~~LOC~~ SOLN
40.0000 mg | SUBCUTANEOUS | Status: DC
  Filled 2014-07-24: qty 0.4

## 2014-07-24 MED ORDER — IOHEXOL 350 MG/ML SOLN
140.0000 mL | Freq: Once | INTRAVENOUS | Status: AC | PRN
  Administered 2014-07-24: 140 mL via INTRAVENOUS

## 2014-07-24 MED ORDER — ASPIRIN 300 MG RE SUPP
300.0000 mg | Freq: Once | RECTAL | Status: AC
Start: 2014-07-24 — End: 2014-07-24
  Administered 2014-07-24: 300 mg via RECTAL
  Filled 2014-07-24: qty 1

## 2014-07-24 MED ORDER — SENNOSIDES-DOCUSATE SODIUM 8.6-50 MG PO TABS
1.0000 | ORAL_TABLET | Freq: Every evening | ORAL | Status: DC | PRN
  Filled 2014-07-24: qty 1

## 2014-07-24 NOTE — ED Notes (Signed)
Patient transported to CT 

## 2014-07-24 NOTE — ED Notes (Signed)
Dr. Jerline Pain called back states to insert NG tube and give Plavix through NG tube.

## 2014-07-24 NOTE — ED Notes (Signed)
Dr. Tamera Punt made aware of patients current status, alert and oriented with flaccid to the left side.  Outside window for stroke.

## 2014-07-24 NOTE — ED Notes (Signed)
4.17 troponin called in from main lab.

## 2014-07-24 NOTE — ED Notes (Signed)
Pt returned from CT, they stated that IV infiltrated during scan, IV removed, ice pack in place

## 2014-07-24 NOTE — ED Notes (Signed)
Pt unable to void on bedpan, unable to bear weight to get into standing position to use bedside commode. Pt requesting foley catheter, MD states to place to monitor for strict I and O.

## 2014-07-24 NOTE — ED Notes (Signed)
Reports attempted

## 2014-07-24 NOTE — ED Provider Notes (Signed)
Medical screening examination/treatment/procedure(s) were conducted as a shared visit with non-physician practitioner(s) and myself.  I personally evaluated the patient during the encounter.   EKG Interpretation   Date/Time:  Thursday July 24 2014 14:26:22 EDT Ventricular Rate:  90 PR Interval:  154 QRS Duration: 109 QT Interval:  429 QTC Calculation: 525 R Axis:   34 Text Interpretation:  Sinus rhythm Abnormal inferior Q waves Repol abnrm  suggests ischemia, anterolateral Prolonged QT interval No old tracing to  compare Confirmed by [facility]  MD, TREY ([id number]) on 07/24/2014 3:04:47 PM       CRITICAL CARE Performed by: Arbie Cookey   Total critical care time: 40 min  Critical care time was exclusive of separately billable procedures and treating other patients.  Critical care was necessary to treat or prevent imminent or life-threatening deterioration.  Critical care was time spent personally by me on the following activities: development of treatment plan with patient and/or surrogate as well as nursing, discussions with consultants, evaluation of patient's response to treatment, examination of patient, obtaining history from patient or surrogate, ordering and performing treatments and interventions, ordering and review of laboratory studies, ordering and review of radiographic studies, pulse oximetry and re-evaluation of patient's condition.   64 yo female with left sided face and body weakness, slurred speech which she first noticed when she woke up this morning.  Felt fine when she went to bed last night.  On exam, alert, oriented, nontoxic, not distressed, normal respiratory effort, normal perfusion, left sided facial weakness/droop, left arm strength 0/5, left leg strength 4/5, right sided strength intact.  Due to there wakeup symptoms, she is outside of tPA window, therefore no code stroke called and no tPA given.  On lab testing, troponin elevated and EKG with  nonspecific ST/T changes.  She denies chest pain and denies shortness of breath.  However, I think she needs CT angio to exclude dissection .    Have discussed with Dr. Nicole Kindred (Neurology).    CT negative for dissection, but shows occlusion of M2 Right MCA branch and MCA territory infarcts.  Explanation for elevated troponin not immediately clear, but may be related to cerebral insult.  Consulted cardiology.  Continued to deny chest pain or other anginal type symptoms.  Admitted for further management.    Clinical Impression: CVA Elevated troponin   Serita Grit, MD 07/25/14 [phone]

## 2014-07-24 NOTE — ED Provider Notes (Signed)
CSN: [id number]     Arrival date & time 07/24/14  1417 History   First MD Initiated Contact with Patient 07/24/14 1501     Chief Complaint  Patient presents with  . Cerebrovascular Accident     (Consider location/radiation/quality/duration/timing/severity/associated sxs/prior Treatment) HPI Pt is a 64yo female with hx of hypothyroidism and HTN, presenting to ED from home via EMS with reports of pt being on the floor since 7:45AM this morning. Pt states she felt fine last night going to bed but when she woke up her left side "didn't feel right" causing her to fall.  Left side is so weak, pt had to crawl from her bathroom to living room to get her cell phone to call EMS.  Per triage note, pt is completely flaccid on left side, facial droop and slurred speech.  Pt states she lives alone and is normally very independent including being able to drive a car, normally ambulates w/o a walker or cane. Denies previous hx of similar symptoms. No hx of stroke. Pt reports weakness and numbness of left arm and pain in her left leg and arm but unable to describe the type of pain.  Denies SOB, CP or palpitations. Denies headache or dizziness. Denies recent illness of fever, cough, vomiting or diarrhea.   Past Medical History  Diagnosis Date  . Hypothyroidism   . Hypertension    Past Surgical History  Procedure Laterality Date  . Tubal ligation     Family History  Problem Relation Age of Onset  . Adopted: Yes   History  Substance Use Topics  . Smoking status: Current Some Day Smoker -- 20.00 packs/day    Types: Cigarettes  . Smokeless tobacco: Not on file  . Alcohol Use: Yes     Comment: ocassionally - wine   OB History    No data available     Review of Systems  Constitutional: Negative for fever, chills, diaphoresis, appetite change and fatigue.  Respiratory: Negative for cough and shortness of breath.   Cardiovascular: Negative for chest pain and leg swelling.  Gastrointestinal: Negative  for nausea, vomiting, abdominal pain and diarrhea.  Neurological: Positive for weakness (left arm and left leg, worse in arm). Negative for dizziness, seizures, syncope, light-headedness and headaches.  All other systems reviewed and are negative.     Allergies  Review of patient's allergies indicates no known allergies.  Home Medications   Prior to Admission medications   Medication Sig Start Date End Date Taking? Authorizing Provider  ergocalciferol (DRISDOL) 50000 UNITS capsule Take 1 capsule (50,000 Units total) by mouth once a week. 07/03/14 07/03/15 Yes Barton Fanny, MD  ibuprofen (ADVIL,MOTRIN) 200 MG tablet Take 400 mg by mouth every 6 (six) hours as needed.   Yes Historical Provider, MD  levothyroxine (SYNTHROID, LEVOTHROID) 88 MCG tablet Take 1 tablet (88 mcg total) by mouth daily. 07/08/14  Yes Barton Fanny, MD  lisinopril-hydrochlorothiazide (ZESTORETIC) 20-12.5 MG per tablet Take 1 tablet by mouth daily. 07/03/14  Yes Barton Fanny, MD  albuterol (PROVENTIL HFA;VENTOLIN HFA) 108 (90 BASE) MCG/ACT inhaler Inhale 2 puffs into the lungs every 4 (four) hours as needed for wheezing or shortness of breath (cough, shortness of breath or wheezing.). Patient not taking: Reported on 07/03/2014 12/12/13   Roselee Culver, MD  metoprolol succinate (TOPROL-XL) 25 MG 24 hr tablet Take 1/2 - 1 tablet by mouth once a day. 07/23/14   Barton Fanny, MD   BP 118/98 mmHg  Pulse 82  Temp(Src) 98.4 F (36.9 C) (Oral)  Resp 16  SpO2 93% Physical Exam  Constitutional: She is oriented to person, place, and time. She appears well-developed and well-nourished. No distress.  HENT:  Head: Normocephalic and atraumatic.  Mouth/Throat: Mucous membranes are dry.  Dried cracked lips  Eyes: Conjunctivae and EOM are normal. Pupils are equal, round, and reactive to light. No scleral icterus.  Neck: Normal range of motion. Neck supple.  Cardiovascular: Normal rate, regular rhythm and  normal heart sounds.   Pulmonary/Chest: Effort normal and breath sounds normal. No respiratory distress. She has no wheezes. She has no rales. She exhibits no tenderness.  Abdominal: Soft. Bowel sounds are normal. She exhibits no distension and no mass. There is no tenderness. There is no rebound and no guarding.  Musculoskeletal: Normal range of motion.  Neurological: She is alert and oriented to person, place, and time. A cranial nerve deficit and sensory deficit is present.  asymmetric smile with left sided facial droop. Left arm weakness-unable to move left arm or make a fist with left hand. Decreased sensation to Left arm and hand compared to Right.  Right arm and leg: 5/5 strength with sensation in tact.  Left leg: 4/5 strength with sensation in tact.  Skin: Skin is warm and dry. She is not diaphoretic.  Left arm and leg: telangiectasia and ecchymosis. Skin in tact.   Nursing note and vitals reviewed.   ED Course  Procedures (including critical care time) Labs Review Labs Reviewed  CBC - Abnormal; Notable for the following:    WBC 18.9 (*)    RBC 5.54 (*)    Hemoglobin 16.1 (*)    HCT 47.4 (*)    All other components within normal limits  DIFFERENTIAL - Abnormal; Notable for the following:    Neutrophils Relative % 85 (*)    Neutro Abs 16.1 (*)    Lymphocytes Relative 7 (*)    Monocytes Absolute 1.5 (*)    All other components within normal limits  COMPREHENSIVE METABOLIC PANEL - Abnormal; Notable for the following:    Glucose, Bld 196 (*)    AST 42 (*)    GFR calc non Af Amer 68 (*)    GFR calc Af Amer 79 (*)    All other components within normal limits  URINALYSIS, ROUTINE W REFLEX MICROSCOPIC - Abnormal; Notable for the following:    Glucose, UA 100 (*)    Ketones, ur 15 (*)    All other components within normal limits  CK - Abnormal; Notable for the following:    Total CK 510 (*)    All other components within normal limits  TROPONIN I - Abnormal; Notable for the  following:    Troponin I 4.17 (*)    All other components within normal limits  I-STAT CHEM 8, ED - Abnormal; Notable for the following:    Glucose, Bld 197 (*)    Hemoglobin 18.4 (*)    HCT 54.0 (*)    All other components within normal limits  I-STAT TROPOININ, ED - Abnormal; Notable for the following:    Troponin i, poc 1.82 (*)    All other components within normal limits  ETHANOL  PROTIME-INR  APTT  URINE RAPID DRUG SCREEN (HOSP PERFORMED)  I-STAT TROPOININ, ED    Imaging Review Ct Angio Head W/cm &/or Wo Cm  07/24/2014   CLINICAL DATA:  Stroke. Acute onset left-sided weakness. Facial droop and slurred speech. Last known normal last night.  EXAM: CT ANGIOGRAPHY  HEAD AND NECK  TECHNIQUE: Multidetector CT imaging of the head and neck was performed using the standard protocol during bolus administration of intravenous contrast. Multiplanar CT image reconstructions and MIPs were obtained to evaluate the vascular anatomy. Carotid stenosis measurements (when applicable) are obtained utilizing NASCET criteria, using the distal internal carotid diameter as the denominator.  CONTRAST:  131mL OMNIPAQUE IOHEXOL 350 MG/ML SOLN  There is contrast extravasation during the first injection, which was approximately 70 mL of Omnipaque 350. Patient received a new IV and underwent a second injection without incident. Patient was evaluated by Dr. Pascal Lux of radiology.  COMPARISON:  None.  FINDINGS: CT HEAD  Brain: There are multiple regions of cytotoxic edema in the right MCA territory consistent with areas of acute to subacute infarction. These involve the posterior right insula, operculum, frontal lobe, and parietal lobe. No acute intracranial hemorrhage, mass, midline shift, or extra-axial fluid collection is seen. Ventricles and sulci are normal for age.  Calvarium and skull base: No aggressive osseous lesions.  Paranasal sinuses: Small left maxillary sinus mucous retention cyst. Clear mastoid air cells.   Orbits: Unremarkable.  CTA NECK  Aortic arch: 3 vessel aortic arch with moderate, predominantly noncalcified plaque. Brachiocephalic and right subclavian artery are patent without stenosis. There is mild-to-moderate plaque at the left subclavian artery origin without stenosis.  Right carotid system: Common carotid artery is patent without proximal stenosis. There is a large amount of mixed calcified and noncalcified plaque at the carotid bifurcation which results in severe stenosis of the internal carotid artery origin (radiographic string sign), with the residual patent lumen measuring no more than 1 mm in diameter over a distance of approximately 3 mm. Remainder of the cervical ICA is patent although slightly smaller in caliber compared to the contralateral side.  Left carotid system: Common carotid artery is patent without stenosis and is tortuous proximally. There is mild-to-moderate involving the carotid bifurcation proximal ICA without stenosis. Remainder of the cervical ICA is unremarkable.  Vertebral arteries:Vertebral arteries are patent with the left being slightly larger than the right and without vertebral artery stenosis identified.  Skeleton: Moderate multilevel disc degeneration in the mid and lower cervical spine.  Other neck: Centrilobular emphysema in the lung apices. No neck mass.  CTA HEAD  Anterior circulation: Internal carotid arteries are patent from skullbase to carotid termini. There is mild right and moderate left carotid siphon atherosclerotic plaque without significant stenosis. There is mild narrowing at the right carotid terminus with involvement of the ACA and MCA origins. There is irregular, mild narrowing of the right M1 segment which is small in caliber diffusely. Right MCA bifurcation is patent, however there is occlusion of the M2 inferior division proximally. Left M1 segment and major left MCA branch vessels are patent without evidence of significant stenosis. Left A1 segment is  mildly dominant. Anterior communicating artery is patent. There is mild bilateral A2 irregularity. No intracranial aneurysm is identified.  Posterior circulation: Intracranial vertebral arteries are patent to the basilar with mild irregularity bilaterally but no significant stenosis. Left PICA origin is patent. Right PICA may share a common origin with the right AICA. AICA origins are patent bilaterally with the right being dominant. SCA origins are patent. Basilar artery is patent without stenosis. There is a patent left posterior communicating artery. There may be a tiny right posterior communicating artery. P1 segments are patent without stenosis. There is moderate proximal right P2 stenosis. Mild PCA branch vessel irregularity is present bilaterally.  Venous sinuses: Patent.  Anatomic variants: None.  Delayed phase: Minimal cortical enhancement associated with some of the areas infarction, most notably in the right parietal lobe.  IMPRESSION: 1. Acute right MCA territory infarcts. No intracranial hemorrhage identified. 2. Critical proximal right ICA stenosis. 3. Right MCA M2 inferior division occlusion. 4. Moderate right P2 PCA stenosis.  These results were called by telephone at the time of interpretation on 07/24/2014 at 7:06 pm to Dr. Serita Grit , who verbally acknowledged these results.   Electronically Signed   By: Logan Bores   On: 07/24/2014 19:10   Ct Angio Neck W/cm &/or Wo/cm  07/24/2014   CLINICAL DATA:  Stroke. Acute onset left-sided weakness. Facial droop and slurred speech. Last known normal last night.  EXAM: CT ANGIOGRAPHY HEAD AND NECK  TECHNIQUE: Multidetector CT imaging of the head and neck was performed using the standard protocol during bolus administration of intravenous contrast. Multiplanar CT image reconstructions and MIPs were obtained to evaluate the vascular anatomy. Carotid stenosis measurements (when applicable) are obtained utilizing NASCET criteria, using the distal internal  carotid diameter as the denominator.  CONTRAST:  153mL OMNIPAQUE IOHEXOL 350 MG/ML SOLN  There is contrast extravasation during the first injection, which was approximately 70 mL of Omnipaque 350. Patient received a new IV and underwent a second injection without incident. Patient was evaluated by Dr. Pascal Lux of radiology.  COMPARISON:  None.  FINDINGS: CT HEAD  Brain: There are multiple regions of cytotoxic edema in the right MCA territory consistent with areas of acute to subacute infarction. These involve the posterior right insula, operculum, frontal lobe, and parietal lobe. No acute intracranial hemorrhage, mass, midline shift, or extra-axial fluid collection is seen. Ventricles and sulci are normal for age.  Calvarium and skull base: No aggressive osseous lesions.  Paranasal sinuses: Small left maxillary sinus mucous retention cyst. Clear mastoid air cells.  Orbits: Unremarkable.  CTA NECK  Aortic arch: 3 vessel aortic arch with moderate, predominantly noncalcified plaque. Brachiocephalic and right subclavian artery are patent without stenosis. There is mild-to-moderate plaque at the left subclavian artery origin without stenosis.  Right carotid system: Common carotid artery is patent without proximal stenosis. There is a large amount of mixed calcified and noncalcified plaque at the carotid bifurcation which results in severe stenosis of the internal carotid artery origin (radiographic string sign), with the residual patent lumen measuring no more than 1 mm in diameter over a distance of approximately 3 mm. Remainder of the cervical ICA is patent although slightly smaller in caliber compared to the contralateral side.  Left carotid system: Common carotid artery is patent without stenosis and is tortuous proximally. There is mild-to-moderate involving the carotid bifurcation proximal ICA without stenosis. Remainder of the cervical ICA is unremarkable.  Vertebral arteries:Vertebral arteries are patent with the  left being slightly larger than the right and without vertebral artery stenosis identified.  Skeleton: Moderate multilevel disc degeneration in the mid and lower cervical spine.  Other neck: Centrilobular emphysema in the lung apices. No neck mass.  CTA HEAD  Anterior circulation: Internal carotid arteries are patent from skullbase to carotid termini. There is mild right and moderate left carotid siphon atherosclerotic plaque without significant stenosis. There is mild narrowing at the right carotid terminus with involvement of the ACA and MCA origins. There is irregular, mild narrowing of the right M1 segment which is small in caliber diffusely. Right MCA bifurcation is patent, however there is occlusion of the M2 inferior division proximally. Left M1 segment and major left  MCA branch vessels are patent without evidence of significant stenosis. Left A1 segment is mildly dominant. Anterior communicating artery is patent. There is mild bilateral A2 irregularity. No intracranial aneurysm is identified.  Posterior circulation: Intracranial vertebral arteries are patent to the basilar with mild irregularity bilaterally but no significant stenosis. Left PICA origin is patent. Right PICA may share a common origin with the right AICA. AICA origins are patent bilaterally with the right being dominant. SCA origins are patent. Basilar artery is patent without stenosis. There is a patent left posterior communicating artery. There may be a tiny right posterior communicating artery. P1 segments are patent without stenosis. There is moderate proximal right P2 stenosis. Mild PCA branch vessel irregularity is present bilaterally.  Venous sinuses: Patent.  Anatomic variants: None.  Delayed phase: Minimal cortical enhancement associated with some of the areas infarction, most notably in the right parietal lobe.  IMPRESSION: 1. Acute right MCA territory infarcts. No intracranial hemorrhage identified. 2. Critical proximal right ICA  stenosis. 3. Right MCA M2 inferior division occlusion. 4. Moderate right P2 PCA stenosis.  These results were called by telephone at the time of interpretation on 07/24/2014 at 7:06 pm to Dr. Serita Grit , who verbally acknowledged these results.   Electronically Signed   By: Logan Bores   On: 07/24/2014 19:10   Ct Angio Chest Aorta W/cm &/or Wo/cm  07/24/2014   CLINICAL DATA:  Stroke.  EXAM: CT ANGIOGRAPHY CHEST WITH CONTRAST  TECHNIQUE: Multidetector CT imaging of the chest was performed using the standard protocol during bolus administration of intravenous contrast. Multiplanar CT image reconstructions and MIPs were obtained to evaluate the vascular anatomy.  CONTRAST:  168mL OMNIPAQUE IOHEXOL 350 MG/ML SOLN  COMPARISON:  None.  FINDINGS: THORACIC INLET/BODY WALL:  No acute abnormality.  MEDIASTINUM:  Mild cardiomegaly. No cardiac findings to explain stroke. No pericardial effusion. There is extensive atherosclerosis of the aorta with irregular plaque mainly involving the distal arch and descending aorta. There is no ascending aorta ulcerated plaque or dissection to explain the right carotid circulation infarct. The great vessels are widely patent. The pulmonary arteries are not evaluated due to contrast timing.  LUNG WINDOWS:  Lucencies in the upper lungs have some central vessels, but no definite perceptible wall. These are likely emphysematous spaces. There is a 4 mm noncalcified pulmonary nodule along the lower left major fissure on image 58. There is a 6 mm pulmonary nodule in the right middle lobe on image 52. A smaller pulmonary nodule is present in the left upper lobe on image 30.  UPPER ABDOMEN:  45 mm mass in the left adrenal gland with 2 foci of macroscopic fat. There is also calcification. The remainder of the nodule is fairly low-density. There is some lobulation but the margins appear distinct. 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 This type of internal fat would  be unusual within a cortical carcinoma.  Cholelithiasis and probable 2 mm gallbladder polyp.  OSSEOUS:  No acute fracture.  No suspicious lytic or blastic lesions.  Review of the MIP images confirms the above findings.  IMPRESSION: 1. No great vessel or ascending aorta findings to explain cerebral infarct. 2. Extensive atherosclerosis. 3. Emphysema with bilateral pulmonary nodules, up to 6 mm on the right. Given risk factors for bronchogenic carcinoma, follow-up chest CT at 6 - 12 months is recommended. This recommendation follows the consensus statement: Guidelines for Management of Small Pulmonary Nodules Detected on CT Scans: A Statement from  the Fleischner Society as published in Radiology 2005;237:395-400. 4. 45 mm left adrenal mass with internal fat, most consistent with myelolipoma. 5. Cholelithiasis and probable 3 mm gallbladder polyp.   Electronically Signed   By: Monte Fantasia M.D.   On: 07/24/2014 18:45     EKG Interpretation   Date/Time:  Thursday July 24 2014 20:45:12 EDT Ventricular Rate:  85 PR Interval:  151 QRS Duration: 105 QT Interval:  418 QTC Calculation: 497 R Axis:   31 Text Interpretation:  Sinus rhythm Probable left atrial enlargement Repol  abnrm suggests ischemia, anterolateral repolarization abnormality more  pronounced compared to prior. Confirmed by [facility]  MD, TREY ([id number]) on  07/24/2014 8:56:05 PM      MDM   Final diagnoses:  Left-sided weakness    Pt is a 64yo female presenting to ED with Left sided weakness, woke up with weakness after going to bed feeling fine.  Pt reports laying on the floor since at least 7:45AM.  On exam, pt has left sided weakness with left side facial droop, and most pronounced weakness in left arm. Discussed pt with Dr. Doy Mince who also examined pt and consulted with neurology, Dr. Nicole Kindred.  Dr. Doy Mince reviewed labs, noted elevated troponin, ordered CT angio head, neck and chest.    5:02 PM CK elevated at 510.  Imaging: CT angio  head and neck, CT angio chest still pending.   6:47 PM  Pt back from imaging. CTs still pending.  Pt states numbness in Left arm is unchanged.  Pt c/o mild back pain but states its due to the exam bed.  Declined pain medication. Pt requesting something to eat and drink.   Discussed imaging with Dr. Doy Mince who has already discussed pt with Dr. Nicole Kindred, neurology. Will ensure Dr. Nicole Kindred has seen current imaging, admit pt to medicine service and also consult cardiology given elevated troponin and no old EKG to compare to.  However, elevated troponin most likely due to neurologic findings.   7:36 PM Consulted with Dr. Leonel Ramsay, neurology, who will review CT scans and f/u on pt.   8:29 PM Delay in admitting pt as family medicine was initial consulted who claimed pt was not one of theirs.  [facility] was consulted who stated pt is part of [place] and does in fact belong to [facility] medicine. Will call back family medicine. Cardiology consult has still not called back. Will page again.  Consulted with family medicine, agreed to admit pt to a tele bed. Attending physician will be Dr. Gwendlyn Deutscher.  Cardiology paged, will get repeat EKG while waiting.  8:49 PM Consulted with Cardiology, Dr. Philbert Riser, agreed to come evaluate pt.    Second troponin order per request of cardiology.           Noland Fordyce, PA-C 07/24/14 2315  Serita Grit, MD 07/25/14 [phone]

## 2014-07-24 NOTE — Progress Notes (Signed)
This patient has received 70 ml's of IV omni 350  contrast extravasation into RAC  during a CTA head and neck exam.  The exam was performed on 07/24/2014  Site / affected area assessed by Dr. Sandi Mariscal

## 2014-07-24 NOTE — ED Notes (Signed)
Spoke with Dr. Jerline Pain regarding troponin 4.17, states to release and give Plavix oral. He was made aware that pt failed swallow screen. Dr. Jerline Pain states he will look into other options.

## 2014-07-24 NOTE — ED Notes (Signed)
Attempted to call in troponin to admitting physician. repaged

## 2014-07-24 NOTE — H&P (Signed)
[facility] Admission History and Physical Service Pager: [phone]  Patient name: Carla Hopkins Medical record number: [id number] Date of birth: [birth year] Age: 64 y.o. Gender: female  Primary Care Provider: Ellsworth Lennox, MD Consultants: Neurology, Cardiology Code Status: Full  Chief Complaint: Left sided weakness  Assessment and Plan: Claudean Leavelle is a 64 y.o. female presenting with acute left sided weakness found to have a right MCA CVA on CT. PMH is significant for HTN, Hypothyroidism, and T2DM. Also with elevated troponin, and incidentally found pulmonary nodules here.   Right MCA CVA. Deficits include flaccid paralysis of LUE, left facial droop, and dysarthric speech. CT head shows acute right MCA infarcts, critical right ICA stenosis, and moderate right P2 PCA stenosis. Patient out of time window for tPA. Risk factors include HTN, approximately 50 pack-year smoking history, T2DM, and HLD. See MRI/MRA results below. Correspond with right MCA stroke. - Admitted to stepdown under attending Dr Gwendlyn Deutscher - Neurology consulted, appreciate assistance - carotid dopplers and echocardiogram pending.  - A1c, Lipid panel, TSH - PT/OT consults - SLP consult - Start ASA daily - Will need aggressive risk factor modification  Elevated Troponin. Troponin on admission 1.82 > 4.17. Denies any chest pain, SOB, or palpitations. Possibly related to CVA, though will need to monitor closely for signs of ACS.  - Cardiology consulted, appreciate assistance. - Dr Jerline Pain spoke with cardiology on call after troponin of 4.17 came back. They advised that we should discuss the following options with neuro. Cardiology stated the options were to start heparin and if neurology stated this was not an option, to start the patient on plavix and aspirin, and if this was not an option to monitor the patient and trend troponins.  - Per Neurology patient is at high risk for hemorrhagic  conversion - will not start heparin drip at this time. Agreed with plavix load. - Will load with 300mg  plavix and continue with plavix 75mg  daily - Trend troponins   Mild Rhabdomyolysis. CK 510 on admission. Likely due to being down on floor for several hours - Continue to monitor - consider trending  Leukocytosis. WBC 18.9 on admission. Likely acute stress reaction. - Continue to monitor.   Pulmonary Nodules. Bilateral pulmonary nodules found on CT incidentally. Concern for bronchiogenic carcinoma.  - Follow up in 6-12 months per radiology  HLD. Lipid panel here: Total cholesterol 317, TG 154, LDL 240. Not on any medications. 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 Also with HLD on lipid panel in 2015.  - Will need to start high intensity statin therapy when taking PO and when CK improves.   HTN. At goal. Will allow up to 220/120 for first 24 hours.  - Hold home lisinopril-HCTZ and metoprolol.   T2DM. A1c 7.9 on 07/03/2014. Not on any medications.  - Will need stricter outpatient management.  - F/u A1c  Hypothyroidism.  - Continue home synthroid once taking PO  OSA. - CPAP at night  FEN/GI: NPO until passes swallow eval Prophylaxis: SCDs given petechial hemorrhage on MRI (lovenox was scheduled for 10 am will need to discuss with neuro whether this is still an option for DVT prophylaxis given MRI findings)  Disposition: Admitted to stepdown pending above management.   History of Present Illness: Carla Hopkins is a 64 y.o. female presenting with stroke.  Patient reports that she woke up early this morning and became dizzy and fell to the floor. Patient was down for about 4-5 hours before she was able to crawl to a  phone to call EMS. Patient says that she "just didn't feel right" when she woke up. She stood up from bed and felt light headed, then fell. Denied any vertigo symptoms. States that when she woke up she noticed that she could not move her left arm. Also noticed that her speech  was "not normal." States that EMS came to her house and then took her to the ED for further evaluation.  Denies any current numbness or tingling. States that her legs and back are sore from crawling on the floor this morning. Denies any chest pain. No palpitations. Has had dyspnea for the past few months, currently stable. No changes in her neuro deficits since this morning. Long history of smoking 1 PPD since teenage years.  In the ED, patient had CT head which showed acute right MCA territory infarcts. Other work up was significant for leukocytosis (WBC 18.9) and elevated troponin (1.82). Neurology and cardiology were consulted in the ED and the patient was admitted.  Review Of Systems: Per HPI, otherwise 12 point review of systems was performed and was unremarkable.  Patient Active Problem List   Diagnosis Date Noted  . CVA (cerebral infarction) 07/24/2014  . Benign essential HTN 07/23/2014  . Obstructive sleep apnea 07/23/2014  . Hypothyroidism 07/03/2014  . Type II or unspecified type diabetes mellitus without mention of complication, not stated as uncontrolled 09/25/2013  . BMI 38.0-38.9,adult 05/19/2013  . Nicotine addiction 05/19/2013  . Right atrial enlargement 05/19/2013   Past Medical History: Past Medical History  Diagnosis Date  . Hypothyroidism   . Hypertension    Past Surgical History: Past Surgical History  Procedure Laterality Date  . Tubal ligation     Social History: History  Substance Use Topics  . Smoking status: Current Some Day Smoker -- 20.00 packs/day    Types: Cigarettes  . Smokeless tobacco: Not on file  . Alcohol Use: Yes     Comment: ocassionally - wine   Additional social history: Lives alone. Denies drug use.  Please also refer to relevant sections of EMR.  Family History: Family History  Problem Relation Age of Onset  . Adopted: Yes   Allergies and Medications: No Known Allergies No current facility-administered medications on file  prior to encounter.   Current Outpatient Prescriptions on File Prior to Encounter  Medication Sig Dispense Refill  . ergocalciferol (DRISDOL) 50000 UNITS capsule Take 1 capsule (50,000 Units total) by mouth once a week. 12 capsule 1  . levothyroxine (SYNTHROID, LEVOTHROID) 88 MCG tablet Take 1 tablet (88 mcg total) by mouth daily. 90 tablet 1  . lisinopril-hydrochlorothiazide (ZESTORETIC) 20-12.5 MG per tablet Take 1 tablet by mouth daily. 90 tablet 1  . albuterol (PROVENTIL HFA;VENTOLIN HFA) 108 (90 BASE) MCG/ACT inhaler Inhale 2 puffs into the lungs every 4 (four) hours as needed for wheezing or shortness of breath (cough, shortness of breath or wheezing.). (Patient not taking: Reported on 07/03/2014) 1 Inhaler 12  . metoprolol succinate (TOPROL-XL) 25 MG 24 hr tablet Take 1/2 - 1 tablet by mouth once a day. 30 tablet 3    Objective: BP 136/87 mmHg  Pulse 81  Temp(Src) 98.4 F (36.9 C) (Oral)  Resp 19  SpO2 93% Exam: General: 64 year old female lying in hospital bed in no acute distress HEENT: NCAT, MMM, xanthoma noted on left inferior eye lid,  Cardiovascular: RRR, no murmurs appreciated.  Respiratory: NWOB, nasal cannula in place, faint wheezes at bases bilaterally Abdomen: +BS, soft, NT, ND Extremities: No edema,  WWP Skin: No rashes Neuro: Alert and conversational. Dysarthric speech. PERRL, EOMI, left facial droop noted, hearing grossly intact bilaterally, tongue deviates to the left, uvuvla deviates to right. Trapezius 5/5 on right, 0/5 on left. Left UE with 0/5 strength, right UE with normal strength. Bilateral lower extremities with 5/5 strength. No sensation in LUE. Sensation intact otherwise. Gait deferred.  Labs and Imaging: CBC BMET   Recent Labs Lab 07/24/14 1452 07/24/14 1518  WBC 18.9*  --   HGB 16.1* 18.4*  HCT 47.4* 54.0*  PLT 319  --     Recent Labs Lab 07/24/14 1452 07/24/14 1518  NA 138 139  K 3.7 3.7  CL 103 103  CO2 24  --   BUN 18 22  CREATININE  0.88 0.80  GLUCOSE 196* 197*  CALCIUM 9.4  --      Total cholesterol 317, TG 154, LDL 240 Troponin 1.82 > 4.17 CK 510  Urinalysis    Component Value Date/Time   COLORURINE YELLOW 07/24/2014 1616   APPEARANCEUR CLEAR 07/24/2014 1616   LABSPEC 1.023 07/24/2014 1616   PHURINE 5.5 07/24/2014 1616   GLUCOSEU 100* 07/24/2014 1616   HGBUR NEGATIVE 07/24/2014 1616   BILIRUBINUR NEGATIVE 07/24/2014 1616   KETONESUR 15* 07/24/2014 1616   PROTEINUR NEGATIVE 07/24/2014 1616   UROBILINOGEN 0.2 07/24/2014 1616   NITRITE NEGATIVE 07/24/2014 1616   LEUKOCYTESUR NEGATIVE 07/24/2014 1616   Utox: negative Ethanol: <5  EKG: NSR, TWI in V1 through V5  Ct Angio Head W/cm &/or Wo Cm  07/24/2014   CLINICAL DATA:  Stroke. Acute onset left-sided weakness. Facial droop and slurred speech. Last known normal last night.  EXAM: CT ANGIOGRAPHY HEAD AND NECK  TECHNIQUE: Multidetector CT imaging of the head and neck was performed using the standard protocol during bolus administration of intravenous contrast. Multiplanar CT image reconstructions and MIPs were obtained to evaluate the vascular anatomy. Carotid stenosis measurements (when applicable) are obtained utilizing NASCET criteria, using the distal internal carotid diameter as the denominator.  CONTRAST:  128mL OMNIPAQUE IOHEXOL 350 MG/ML SOLN  There is contrast extravasation during the first injection, which was approximately 70 mL of Omnipaque 350. Patient received a new IV and underwent a second injection without incident. Patient was evaluated by Dr. Pascal Lux of radiology.  COMPARISON:  None.  FINDINGS: CT HEAD  Brain: There are multiple regions of cytotoxic edema in the right MCA territory consistent with areas of acute to subacute infarction. These involve the posterior right insula, operculum, frontal lobe, and parietal lobe. No acute intracranial hemorrhage, mass, midline shift, or extra-axial fluid collection is seen. Ventricles and sulci are normal for  age.  Calvarium and skull base: No aggressive osseous lesions.  Paranasal sinuses: Small left maxillary sinus mucous retention cyst. Clear mastoid air cells.  Orbits: Unremarkable.  CTA NECK  Aortic arch: 3 vessel aortic arch with moderate, predominantly noncalcified plaque. Brachiocephalic and right subclavian artery are patent without stenosis. There is mild-to-moderate plaque at the left subclavian artery origin without stenosis.  Right carotid system: Common carotid artery is patent without proximal stenosis. There is a large amount of mixed calcified and noncalcified plaque at the carotid bifurcation which results in severe stenosis of the internal carotid artery origin (radiographic string sign), with the residual patent lumen measuring no more than 1 mm in diameter over a distance of approximately 3 mm. Remainder of the cervical ICA is patent although slightly smaller in caliber compared to the contralateral side.  Left carotid system: Common carotid  artery is patent without stenosis and is tortuous proximally. There is mild-to-moderate involving the carotid bifurcation proximal ICA without stenosis. Remainder of the cervical ICA is unremarkable.  Vertebral arteries:Vertebral arteries are patent with the left being slightly larger than the right and without vertebral artery stenosis identified.  Skeleton: Moderate multilevel disc degeneration in the mid and lower cervical spine.  Other neck: Centrilobular emphysema in the lung apices. No neck mass.  CTA HEAD  Anterior circulation: Internal carotid arteries are patent from skullbase to carotid termini. There is mild right and moderate left carotid siphon atherosclerotic plaque without significant stenosis. There is mild narrowing at the right carotid terminus with involvement of the ACA and MCA origins. There is irregular, mild narrowing of the right M1 segment which is small in caliber diffusely. Right MCA bifurcation is patent, however there is occlusion of  the M2 inferior division proximally. Left M1 segment and major left MCA branch vessels are patent without evidence of significant stenosis. Left A1 segment is mildly dominant. Anterior communicating artery is patent. There is mild bilateral A2 irregularity. No intracranial aneurysm is identified.  Posterior circulation: Intracranial vertebral arteries are patent to the basilar with mild irregularity bilaterally but no significant stenosis. Left PICA origin is patent. Right PICA may share a common origin with the right AICA. AICA origins are patent bilaterally with the right being dominant. SCA origins are patent. Basilar artery is patent without stenosis. There is a patent left posterior communicating artery. There may be a tiny right posterior communicating artery. P1 segments are patent without stenosis. There is moderate proximal right P2 stenosis. Mild PCA branch vessel irregularity is present bilaterally.  Venous sinuses: Patent.  Anatomic variants: None.  Delayed phase: Minimal cortical enhancement associated with some of the areas infarction, most notably in the right parietal lobe.  IMPRESSION: 1. Acute right MCA territory infarcts. No intracranial hemorrhage identified. 2. Critical proximal right ICA stenosis. 3. Right MCA M2 inferior division occlusion. 4. Moderate right P2 PCA stenosis.  These results were called by telephone at the time of interpretation on 07/24/2014 at 7:06 pm to Dr. Serita Grit , who verbally acknowledged these results.   Electronically Signed   By: Logan Bores   On: 07/24/2014 19:10   Ct Angio Neck W/cm &/or Wo/cm  07/24/2014   CLINICAL DATA:  Stroke. Acute onset left-sided weakness. Facial droop and slurred speech. Last known normal last night.  EXAM: CT ANGIOGRAPHY HEAD AND NECK  TECHNIQUE: Multidetector CT imaging of the head and neck was performed using the standard protocol during bolus administration of intravenous contrast. Multiplanar CT image reconstructions and MIPs  were obtained to evaluate the vascular anatomy. Carotid stenosis measurements (when applicable) are obtained utilizing NASCET criteria, using the distal internal carotid diameter as the denominator.  CONTRAST:  172mL OMNIPAQUE IOHEXOL 350 MG/ML SOLN  There is contrast extravasation during the first injection, which was approximately 70 mL of Omnipaque 350. Patient received a new IV and underwent a second injection without incident. Patient was evaluated by Dr. Pascal Lux of radiology.  COMPARISON:  None.  FINDINGS: CT HEAD  Brain: There are multiple regions of cytotoxic edema in the right MCA territory consistent with areas of acute to subacute infarction. These involve the posterior right insula, operculum, frontal lobe, and parietal lobe. No acute intracranial hemorrhage, mass, midline shift, or extra-axial fluid collection is seen. Ventricles and sulci are normal for age.  Calvarium and skull base: No aggressive osseous lesions.  Paranasal sinuses: Small left maxillary sinus  mucous retention cyst. Clear mastoid air cells.  Orbits: Unremarkable.  CTA NECK  Aortic arch: 3 vessel aortic arch with moderate, predominantly noncalcified plaque. Brachiocephalic and right subclavian artery are patent without stenosis. There is mild-to-moderate plaque at the left subclavian artery origin without stenosis.  Right carotid system: Common carotid artery is patent without proximal stenosis. There is a large amount of mixed calcified and noncalcified plaque at the carotid bifurcation which results in severe stenosis of the internal carotid artery origin (radiographic string sign), with the residual patent lumen measuring no more than 1 mm in diameter over a distance of approximately 3 mm. Remainder of the cervical ICA is patent although slightly smaller in caliber compared to the contralateral side.  Left carotid system: Common carotid artery is patent without stenosis and is tortuous proximally. There is mild-to-moderate involving  the carotid bifurcation proximal ICA without stenosis. Remainder of the cervical ICA is unremarkable.  Vertebral arteries:Vertebral arteries are patent with the left being slightly larger than the right and without vertebral artery stenosis identified.  Skeleton: Moderate multilevel disc degeneration in the mid and lower cervical spine.  Other neck: Centrilobular emphysema in the lung apices. No neck mass.  CTA HEAD  Anterior circulation: Internal carotid arteries are patent from skullbase to carotid termini. There is mild right and moderate left carotid siphon atherosclerotic plaque without significant stenosis. There is mild narrowing at the right carotid terminus with involvement of the ACA and MCA origins. There is irregular, mild narrowing of the right M1 segment which is small in caliber diffusely. Right MCA bifurcation is patent, however there is occlusion of the M2 inferior division proximally. Left M1 segment and major left MCA branch vessels are patent without evidence of significant stenosis. Left A1 segment is mildly dominant. Anterior communicating artery is patent. There is mild bilateral A2 irregularity. No intracranial aneurysm is identified.  Posterior circulation: Intracranial vertebral arteries are patent to the basilar with mild irregularity bilaterally but no significant stenosis. Left PICA origin is patent. Right PICA may share a common origin with the right AICA. AICA origins are patent bilaterally with the right being dominant. SCA origins are patent. Basilar artery is patent without stenosis. There is a patent left posterior communicating artery. There may be a tiny right posterior communicating artery. P1 segments are patent without stenosis. There is moderate proximal right P2 stenosis. Mild PCA branch vessel irregularity is present bilaterally.  Venous sinuses: Patent.  Anatomic variants: None.  Delayed phase: Minimal cortical enhancement associated with some of the areas infarction, most  notably in the right parietal lobe.  IMPRESSION: 1. Acute right MCA territory infarcts. No intracranial hemorrhage identified. 2. Critical proximal right ICA stenosis. 3. Right MCA M2 inferior division occlusion. 4. Moderate right P2 PCA stenosis.  These results were called by telephone at the time of interpretation on 07/24/2014 at 7:06 pm to Dr. Serita Grit , who verbally acknowledged these results.   Electronically Signed   By: Logan Bores   On: 07/24/2014 19:10   Ct Angio Chest Aorta W/cm &/or Wo/cm  07/24/2014   CLINICAL DATA:  Stroke.  EXAM: CT ANGIOGRAPHY CHEST WITH CONTRAST  TECHNIQUE: Multidetector CT imaging of the chest was performed using the standard protocol during bolus administration of intravenous contrast. Multiplanar CT image reconstructions and MIPs were obtained to evaluate the vascular anatomy.  CONTRAST:  167mL OMNIPAQUE IOHEXOL 350 MG/ML SOLN  COMPARISON:  None.  FINDINGS: THORACIC INLET/BODY WALL:  No acute abnormality.  MEDIASTINUM:  Mild cardiomegaly. No  cardiac findings to explain stroke. No pericardial effusion. There is extensive atherosclerosis of the aorta with irregular plaque mainly involving the distal arch and descending aorta. There is no ascending aorta ulcerated plaque or dissection to explain the right carotid circulation infarct. The great vessels are widely patent. The pulmonary arteries are not evaluated due to contrast timing.  LUNG WINDOWS:  Lucencies in the upper lungs have some central vessels, but no definite perceptible wall. These are likely emphysematous spaces. There is a 4 mm noncalcified pulmonary nodule along the lower left major fissure on image 58. There is a 6 mm pulmonary nodule in the right middle lobe on image 52. A smaller pulmonary nodule is present in the left upper lobe on image 30.  UPPER ABDOMEN:  45 mm mass in the left adrenal gland with 2 foci of macroscopic fat. There is also calcification. The remainder of the nodule is fairly low-density.  There is some lobulation but the margins appear distinct. 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 This type of internal fat would be unusual within a cortical carcinoma.  Cholelithiasis and probable 2 mm gallbladder polyp.  OSSEOUS:  No acute fracture.  No suspicious lytic or blastic lesions.  Review of the MIP images confirms the above findings.  IMPRESSION: 1. No great vessel or ascending aorta findings to explain cerebral infarct. 2. Extensive atherosclerosis. 3. Emphysema with bilateral pulmonary nodules, up to 6 mm on the right. Given risk factors for bronchogenic carcinoma, follow-up chest CT at 6 - 12 months is recommended. This recommendation follows the consensus statement: Guidelines for Management of Small Pulmonary Nodules Detected on CT Scans: A Statement from the [place] as published in Radiology 2005;237:395-400. 4. 45 mm left adrenal mass with internal fat, most consistent with myelolipoma. 5. Cholelithiasis and probable 3 mm gallbladder polyp.   Electronically Signed   By: Monte Fantasia M.D.   On: 07/24/2014 18:45   Mr Brain Wo Contrast  07/25/2014   CLINICAL DATA:  Initial evaluation for acute left-sided weakness.  EXAM: MRI HEAD WITHOUT CONTRAST  MRA HEAD WITHOUT CONTRAST  TECHNIQUE: Multiplanar, multiecho pulse sequences of the brain and surrounding structures were obtained without intravenous contrast. Angiographic images of the head were obtained using MRA technique without contrast.  COMPARISON:  Prior CT and CTA from 07/24/2014.  FINDINGS: MRI HEAD FINDINGS  Patchy multi focal areas of restricted diffusion again seen throughout the right frontal and parietal lobes, compatible with acute right MCA territory infarct. There is involvement of the right insular cortex. Infarct involves the cortical gray matter as well as the deep and subcortical white matter. There is a 13 mm infarct within the right thalamus. There is localized cytotoxic edema  without significant mass effect. Overall, distribution is stable from prior CT. There is minimal associated petechial hemorrhage without frank hemorrhagic conversion. There is an additional 8 mm cortical infarct within the parasagittal anterior right frontal lobe (series 5, image 30), likely ACA distribution. No left cerebral or infratentorial infarcts.  No mass lesion or midline shift. No hydrocephalus. No extra-axial fluid collection.  Craniocervical junction within normal limits. Pituitary gland normal.  No acute abnormality about the orbits. Paranasal sinuses are clear. No mastoid effusion. Inner ear structures are normal.  Cerebral atrophy with mild chronic small vessel ischemic changes present in the periventricular white matter. Small vessel type changes present within the pons.  MRA HEAD FINDINGS  ANTERIOR CIRCULATION:  Visualized distal cervical segments of the internal carotid arteries  are patent with antegrade flow. There is mildly attenuated flow within the right ICA as compared to the left, likely related to the critical stenosis proximally within the neck. This involves the right MCA and its distal branches as well. The petrous and cavernous segments of the internal carotid arteries are widely patent. Mild narrowing of the supra clinoid right ICA. This extends into the proximal right M1 and A1 segments. Left A1 segment is dominant. Anterior communicating artery and anterior cerebral arteries patent.  Left M1 segment is widely patent. Left MCA bifurcation and distal MCA branches well opacified.  There is somewhat attenuated and irregular flow within the right M1 segment without focal high-grade stenosis or frank occlusion. Occlusion of the inferior division of the proximal right M2 branch noted, better evaluated on prior CTA. Flow within the distal right MCA branches is attenuated.  POSTERIOR CIRCULATION:  Vertebral arteries are patent to the vertebrobasilar junction. Partially visualized posterior  inferior cerebral arteries patent. Basilar artery widely patent. Superior cerebellar arteries patent proximally. Posterior cerebral arteries opacified to their distal aspects. There is a moderate irregular stenosis within the proximal right P2 segment.  No aneurysm.  IMPRESSION: MRI HEAD IMPRESSION:  1. Multi focal patchy acute right MCA territory infarcts. Overall distribution is stable from prior CT. There is very minimal associated petechial hemorrhage without evidence for hemorrhagic conversion. 2. Additional 8 mm cortical infarct within the parasagittal cortical gray matter of the anterior right frontal lobe, likely right ACA in distribution. 3. Mild cerebral atrophy with chronic microvascular ischemic disease.  MRA HEAD IMPRESSION:  1. Attenuated flow throughout the visualized right ICA and MCA arteries, likely related to the critical stenosis seen proximally within the neck on prior CTA. Flow within the right M1 segment is thready and irregular, with attenuated distal right MCA branch flow. 2. Right MCA M2 inferior division occlusion, stable from prior CTA. 3. Irregular moderate right P2 stenosis.   Electronically Signed   By: Jeannine Boga M.D.   On: 07/25/2014 02:07   Mr Jodene Nam Head/brain Wo Cm  07/25/2014   CLINICAL DATA:  Initial evaluation for acute left-sided weakness.  EXAM: MRI HEAD WITHOUT CONTRAST  MRA HEAD WITHOUT CONTRAST  TECHNIQUE: Multiplanar, multiecho pulse sequences of the brain and surrounding structures were obtained without intravenous contrast. Angiographic images of the head were obtained using MRA technique without contrast.  COMPARISON:  Prior CT and CTA from 07/24/2014.  FINDINGS: MRI HEAD FINDINGS  Patchy multi focal areas of restricted diffusion again seen throughout the right frontal and parietal lobes, compatible with acute right MCA territory infarct. There is involvement of the right insular cortex. Infarct involves the cortical gray matter as well as the deep and  subcortical white matter. There is a 13 mm infarct within the right thalamus. There is localized cytotoxic edema without significant mass effect. Overall, distribution is stable from prior CT. There is minimal associated petechial hemorrhage without frank hemorrhagic conversion. There is an additional 8 mm cortical infarct within the parasagittal anterior right frontal lobe (series 5, image 30), likely ACA distribution. No left cerebral or infratentorial infarcts.  No mass lesion or midline shift. No hydrocephalus. No extra-axial fluid collection.  Craniocervical junction within normal limits. Pituitary gland normal.  No acute abnormality about the orbits. Paranasal sinuses are clear. No mastoid effusion. Inner ear structures are normal.  Cerebral atrophy with mild chronic small vessel ischemic changes present in the periventricular white matter. Small vessel type changes present within the pons.  MRA HEAD FINDINGS  ANTERIOR CIRCULATION:  Visualized distal cervical segments of the internal carotid arteries are patent with antegrade flow. There is mildly attenuated flow within the right ICA as compared to the left, likely related to the critical stenosis proximally within the neck. This involves the right MCA and its distal branches as well. The petrous and cavernous segments of the internal carotid arteries are widely patent. Mild narrowing of the supra clinoid right ICA. This extends into the proximal right M1 and A1 segments. Left A1 segment is dominant. Anterior communicating artery and anterior cerebral arteries patent.  Left M1 segment is widely patent. Left MCA bifurcation and distal MCA branches well opacified.  There is somewhat attenuated and irregular flow within the right M1 segment without focal high-grade stenosis or frank occlusion. Occlusion of the inferior division of the proximal right M2 branch noted, better evaluated on prior CTA. Flow within the distal right MCA branches is attenuated.  POSTERIOR  CIRCULATION:  Vertebral arteries are patent to the vertebrobasilar junction. Partially visualized posterior inferior cerebral arteries patent. Basilar artery widely patent. Superior cerebellar arteries patent proximally. Posterior cerebral arteries opacified to their distal aspects. There is a moderate irregular stenosis within the proximal right P2 segment.  No aneurysm.  IMPRESSION: MRI HEAD IMPRESSION:  1. Multi focal patchy acute right MCA territory infarcts. Overall distribution is stable from prior CT. There is very minimal associated petechial hemorrhage without evidence for hemorrhagic conversion. 2. Additional 8 mm cortical infarct within the parasagittal cortical gray matter of the anterior right frontal lobe, likely right ACA in distribution. 3. Mild cerebral atrophy with chronic microvascular ischemic disease.  MRA HEAD IMPRESSION:  1. Attenuated flow throughout the visualized right ICA and MCA arteries, likely related to the critical stenosis seen proximally within the neck on prior CTA. Flow within the right M1 segment is thready and irregular, with attenuated distal right MCA branch flow. 2. Right MCA M2 inferior division occlusion, stable from prior CTA. 3. Irregular moderate right P2 stenosis.   Electronically Signed   By: Jeannine Boga M.D.   On: 07/25/2014 02:07     Vivi Barrack, MD 07/24/2014, 9:33 PM PGY-1, [place] [facility] pager: [phone], text pages welcome  Upper Level Addendum:  I have seen and evaluated this patient along with Dr. Jerline Pain and reviewed the above note, making necessary revisions in red.   Tommi Rumps, MD Family Medicine PGY-2

## 2014-07-24 NOTE — Progress Notes (Signed)
Carla Hopkins   [id number]   17-Nov-1950  Type of procedure: CTA head and neck with iv cm  07/24/2014  During your examination at [facility] some of the x-ray dye leaked into the tissues around the vein that the x-ray dye was given through.  What should you do?  1. Apply ice 20 minutes four times a day for three days.  2. Keep the affected extremity elevated above the rest of your body for 48 hours.  3. If you develop any of the following signs or symptoms, please contact Radiology at [phone]  Increased pain  Increasing swelling   Change in sensation (ex. Numbness, tingling)  Development of redness or increase in warmth around the affected area  Increasing hardness  Blistering   I have read and understand these instructions.  Any questions I raised have been discussed to my satisfaction.  I understand that failure to follow these instructions may result in additional complications.

## 2014-07-24 NOTE — ED Notes (Signed)
Spoke to admitting MD, reported increased troponin level to 4.17, MD acknowledges, changing order to step down bed.

## 2014-07-24 NOTE — ED Notes (Signed)
EMS - Patient coming from home with stroke.  Patient has been laying on the floor since 07:00 this am.  Patient is completely flaccid on the left side.  Facial droop and slurred speech.  Patient was able to call EMS.  Patient lives alone.  EMS broke through the window to get to patient.

## 2014-07-24 NOTE — ED Notes (Addendum)
Bed placement notified that pt needs stepdown bed.

## 2014-07-24 NOTE — ED Notes (Signed)
Carla Hopkins [phone]  Carla Hopkins [phone] Please contact if needed, neighbors and close friends

## 2014-07-24 NOTE — Consult Note (Signed)
Admission H&P    Chief Complaint: Acute onset of left-sided weakness.  HPI: Carla Hopkins is an 64 y.o. female with a history of hypertension and hypothyroidism resenting with new onset weakness involving left side. Patient was last known well at bedtime last night at 10:30 PM. She fell when she attempted to get out of bed this morning and was unable to get up from the floor. She has not been on antiplatelet therapy. Is no previous history of stroke nor TIA. Initial laboratory studies showed an elevation in troponin of 1.8. CT scan of the head as well as CTA of head and neck and chest, is pending. NIH stroke score was 10.  LSN: 10:30 PM on 07/23/2014 tPA Given: No: Beyond time under for treatment consideration mRankin:  Past Medical History  Diagnosis Date  . Hypothyroidism   . Hypertension     Past Surgical History  Procedure Laterality Date  . Tubal ligation      Family History  Problem Relation Age of Onset  . Adopted: Yes   Social History:  reports that she has been smoking Cigarettes.  She has been smoking about 20.00 packs per day. She does not have any smokeless tobacco history on file. She reports that she drinks alcohol. She reports that she does not use illicit drugs.  Allergies: No Known Allergies  Medications: Patient's preadmission medications were reviewed by me.  ROS: History obtained from the patient  General ROS: negative for - chills, fatigue, fever, night sweats, weight gain or weight loss Psychological ROS: negative for - behavioral disorder, hallucinations, memory difficulties, mood swings or suicidal ideation Ophthalmic ROS: negative for - blurry vision, double vision, eye pain or loss of vision ENT ROS: negative for - epistaxis, nasal discharge, oral lesions, sore throat, tinnitus or vertigo Allergy and Immunology ROS: negative for - hives or itchy/watery eyes Hematological and Lymphatic ROS: negative for - bleeding problems, bruising or swollen lymph  nodes Endocrine ROS: negative for - galactorrhea, hair pattern changes, polydipsia/polyuria or temperature intolerance Respiratory ROS: negative for - cough, hemoptysis, shortness of breath or wheezing Cardiovascular ROS: negative for - chest pain, dyspnea on exertion, edema or irregular heartbeat Gastrointestinal ROS: negative for - abdominal pain, diarrhea, hematemesis, nausea/vomiting or stool incontinence Genito-Urinary ROS: negative for - dysuria, hematuria, incontinence or urinary frequency/urgency Musculoskeletal ROS: negative for - joint swelling or muscular weakness Neurological ROS: as noted in HPI; mild headache since recent blood pressure medication changes Dermatological ROS: negative for rash and skin lesion changes  Physical Examination: Blood pressure 129/81, pulse 80, temperature 98.4 F (36.9 C), temperature source Oral, resp. rate 16, SpO2 95 %.  HEENT-  Normocephalic, no lesions, without obvious abnormality.  Normal external eye and conjunctiva.  Normal TM's bilaterally.  Normal auditory canals and external ears. Normal external nose, mucus membranes and septum.  Normal pharynx. Neck supple with no masses, nodes, nodules or enlargement. Cardiovascular - regular rate and rhythm, S1, S2 normal, no murmur, click, rub or gallop Lungs - chest clear, no wheezing, rales, normal symmetric air entry Abdomen - soft, non-tender; bowel sounds normal; no masses,  no organomegaly Extremities - no joint deformities, effusion, or inflammation and no edema  Neurologic Examination: Mental Status: Alert, oriented, thought content appropriate.  Speech slightly slurred without evidence of aphasia. Able to follow commands without difficulty. Cranial Nerves: II-dense left homonymous hemianopsia. III/IV/VI-Pupils were equal and reacted normally to light. Extraocular movements were full and conjugate.    V/VII-no facial numbness; moderately severe left lower facial  weakness. VIII-normal. X-mild dysarthria. XI: trapezius strength/neck flexion strength normal bilaterally XII-midline tongue extension with normal strength. Motor: Flaccid paralysis of left upper extremity; moderate proximal and distal weakness of left lower extremity; normal strength of right extremities. Sensory: Normal throughout. Deep Tendon Reflexes: 2+ and slightly greater on the left compared to the right. Plantars: Mute bilaterally Cerebellar: Normal finger-to-nose testing. Carotid auscultation: Normal  Results for orders placed or performed during the hospital encounter of 07/24/14 (from the past 48 hour(s))  Ethanol     Status: None   Collection Time: 07/24/14  2:52 PM  Result Value Ref Range   Alcohol, Ethyl (B) <5 0 - 9 mg/dL    Comment:        LOWEST DETECTABLE LIMIT FOR SERUM ALCOHOL IS 11 mg/dL FOR MEDICAL PURPOSES ONLY   Protime-INR     Status: None   Collection Time: 07/24/14  2:52 PM  Result Value Ref Range   Prothrombin Time 13.0 11.6 - 15.2 seconds   INR 0.97 0.00 - 1.49  APTT     Status: None   Collection Time: 07/24/14  2:52 PM  Result Value Ref Range   aPTT 27 24 - 37 seconds  CBC     Status: Abnormal   Collection Time: 07/24/14  2:52 PM  Result Value Ref Range   WBC 18.9 (H) 4.0 - 10.5 K/uL   RBC 5.54 (H) 3.87 - 5.11 MIL/uL   Hemoglobin 16.1 (H) 12.0 - 15.0 g/dL   HCT 47.4 (H) 36.0 - 46.0 %   MCV 85.6 78.0 - 100.0 fL   MCH 29.1 26.0 - 34.0 pg   MCHC 34.0 30.0 - 36.0 g/dL   RDW 15.4 11.5 - 15.5 %   Platelets 319 150 - 400 K/uL  Differential     Status: Abnormal   Collection Time: 07/24/14  2:52 PM  Result Value Ref Range   Neutrophils Relative % 85 (H) 43 - 77 %   Neutro Abs 16.1 (H) 1.7 - 7.7 K/uL   Lymphocytes Relative 7 (L) 12 - 46 %   Lymphs Abs 1.4 0.7 - 4.0 K/uL   Monocytes Relative 8 3 - 12 %   Monocytes Absolute 1.5 (H) 0.1 - 1.0 K/uL   Eosinophils Relative 0 0 - 5 %   Eosinophils Absolute 0.0 0.0 - 0.7 K/uL   Basophils Relative 0 0  - 1 %   Basophils Absolute 0.0 0.0 - 0.1 K/uL  Comprehensive metabolic panel     Status: Abnormal   Collection Time: 07/24/14  2:52 PM  Result Value Ref Range   Sodium 138 135 - 145 mmol/L   Potassium 3.7 3.5 - 5.1 mmol/L   Chloride 103 96 - 112 mmol/L   CO2 24 19 - 32 mmol/L   Glucose, Bld 196 (H) 70 - 99 mg/dL   BUN 18 6 - 23 mg/dL   Creatinine, Ser 0.88 0.50 - 1.10 mg/dL   Calcium 9.4 8.4 - 10.5 mg/dL   Total Protein 7.5 6.0 - 8.3 g/dL   Albumin 3.6 3.5 - 5.2 g/dL   AST 42 (H) 0 - 37 U/L   ALT 26 0 - 35 U/L   Alkaline Phosphatase 89 39 - 117 U/L   Total Bilirubin 0.6 0.3 - 1.2 mg/dL   GFR calc non Af Amer 68 (L) >90 mL/min   GFR calc Af Amer 79 (L) >90 mL/min    Comment: (NOTE) The eGFR has been calculated using the CKD EPI equation. This calculation has  not been validated in all clinical situations. eGFR's persistently <90 mL/min signify possible Chronic Kidney Disease.    Anion gap 11 5 - 15  I-Stat Troponin, ED (not at [facility])     Status: Abnormal   Collection Time: 07/24/14  3:16 PM  Result Value Ref Range   Troponin i, poc 1.82 (HH) 0.00 - 0.08 ng/mL   Comment NOTIFIED PHYSICIAN    Comment 3            Comment: Due to the release kinetics of cTnI, a negative result within the first hours of the onset of symptoms does not rule out myocardial infarction with certainty. If myocardial infarction is still suspected, repeat the test at appropriate intervals.   I-Stat Chem 8, ED     Status: Abnormal   Collection Time: 07/24/14  3:18 PM  Result Value Ref Range   Sodium 139 135 - 145 mmol/L   Potassium 3.7 3.5 - 5.1 mmol/L   Chloride 103 96 - 112 mmol/L   BUN 22 6 - 23 mg/dL   Creatinine, Ser 0.80 0.50 - 1.10 mg/dL   Glucose, Bld 197 (H) 70 - 99 mg/dL   Calcium, Ion 1.19 1.13 - 1.30 mmol/L   TCO2 21 0 - 100 mmol/L   Hemoglobin 18.4 (H) 12.0 - 15.0 g/dL   HCT 54.0 (H) 36.0 - 46.0 %   No results found.  Assessment: 64 y.o. female history of hypertension presenting  with probable acute right MCA territory cerebral infarction.  Stroke Risk Factors - hypertension  Plan: 1. HgbA1c, fasting lipid panel 2. MRI of the brain without contrast 3. CTA with contrast of head and neck, as planned 4. PT consult, OT consult, Speech consult 5. Echocardiogram 6. Prophylactic therapy-Antiplatelet med: Aspirin  7. Risk factor modification 8. Telemetry monitoring  C.R. Nicole Kindred, MD [facility] [phone]  07/24/2014, 4:14 PM

## 2014-07-24 NOTE — Consult Note (Addendum)
Referring Physician: Dr. Gwendlyn Deutscher Primary Physician: Primary Cardiologist: Reason for Consultation: positive troponin   HPI: Carla Hopkins is a 64 yo woman with HTN and hypothyroidism who presented with acute MCA stroke.  She reports that when she attempted to get out of bed this morning she fell due to left sided weakness. She was unable to get up off the floor and eventually dragged herself to the phone.  Initial work up here including neurology consultation and CTA c/w acute right MCA stroke.  As part of her stroke work up, initial troponin from 14:30 resulted at 1.85 prompting cardiology consult tonight.  Of note, patient denies any chest pain whatsoever.  She does report some mild DOE that may have been worse this last week but states "but I'm a smoker".  She denies any orthopnea or PND.  Denies palpitations or syncope.  Currently asymptomatic from a cardiopulmonary standpoint.       Review of Systems:     Cardiac Review of Systems: {Y] = yes [ ]  = no  Chest Pain [    ]  Resting SOB [   ] Exertional SOB  [  ]  Orthopnea [  ]   Pedal Edema [   ]    Palpitations [  ] Syncope  [  ]   Presyncope [   ]  General Review of Systems: [Y] = yes [  ]=no Constitional: recent weight change [  ]; anorexia [  ]; fatigue [  ]; nausea [  ]; night sweats [  ]; fever [  ]; or chills [  ];                                                                      Eyes : blurred vision [  ]; diplopia [   ]; vision changes [  ];  Amaurosis fugax[  ]; Resp: cough [  ];  wheezing[  ];  hemoptysis[  ];  PND [  ];  GI:  gallstones[  ], vomiting[  ];  dysphagia[  ]; melena[  ];  hematochezia [  ]; heartburn[  ];   GU: kidney stones [  ]; hematuria[  ];   dysuria [  ];  nocturia[  ]; incontinence [  ];             Skin: rash, swelling[  ];, hair loss[  ];  peripheral edema[  ];  or itching[  ]; Musculosketetal: myalgias[  ];  joint swelling[  ];  joint erythema[  ];  joint pain[  ];  back pain[  ];  Heme/Lymph:  bruising[  ];  bleeding[  ];  anemia[  ];  Neuro: TIA[  ];  headaches[  ];  stroke[x  ];  vertigo[  ];  seizures[  ];   paresthesias[  ];  difficulty walking[  ];  Psych:depression[  ]; anxiety[  ];  Endocrine: diabetes[  ];  thyroid dysfunction[  ];  Other:  Past Medical History  Diagnosis Date  . Hypothyroidism   . Hypertension      (Not in a hospital admission)     Infusions:   No Known Allergies  History   Social History  . Marital Status: Single  Spouse Name: N/A  . Number of Children: N/A  . Years of Education: N/A   Occupational History  . Not on file.   Social History Main Topics  . Smoking status: Current Some Day Smoker -- 20.00 packs/day    Types: Cigarettes  . Smokeless tobacco: Not on file  . Alcohol Use: Yes     Comment: ocassionally - wine  . Drug Use: No  . Sexual Activity: Not Currently   Other Topics Concern  . Not on file   Social History Narrative    Family History  Problem Relation Age of Onset  . Adopted: Yes    PHYSICAL EXAM: Filed Vitals:   07/24/14 2133  BP: 118/76  Pulse: 81  Temp:   Resp: 21    No intake or output data in the 24 hours ending 07/24/14 2150  General:  Well appearing. No respiratory difficulty HEENT: mild left sided facial droop, PERRL, EOMI, anicteric, dry mucous membranes Neck: supple. no JVD. Carotids 2+ bilat; no bruits. No lymphadenopathy or thryomegaly appreciated. Cor: PMI nondisplaced. Regular rate & rhythm. No rubs, gallops or murmurs. Lungs: Mild expiratory wheezes Abdomen: soft, nontender, nondistended. No hepatosplenomegaly. No bruits or masses. Good bowel sounds. Extremities: no cyanosis, clubbing, rash, edema Neuro: alert & oriented x 3, mild left sided facial droop, LUE 0/5 strength other extremities grossly normal.  ECG:  SR, LVH with repol abnormality lateral leads, TWI right precordial leads.  Results for orders placed or performed during the hospital encounter of 07/24/14 (from  the past 24 hour(s))  Ethanol     Status: None   Collection Time: 07/24/14  2:52 PM  Result Value Ref Range   Alcohol, Ethyl (B) <5 0 - 9 mg/dL  Protime-INR     Status: None   Collection Time: 07/24/14  2:52 PM  Result Value Ref Range   Prothrombin Time 13.0 11.6 - 15.2 seconds   INR 0.97 0.00 - 1.49  APTT     Status: None   Collection Time: 07/24/14  2:52 PM  Result Value Ref Range   aPTT 27 24 - 37 seconds  CBC     Status: Abnormal   Collection Time: 07/24/14  2:52 PM  Result Value Ref Range   WBC 18.9 (H) 4.0 - 10.5 K/uL   RBC 5.54 (H) 3.87 - 5.11 MIL/uL   Hemoglobin 16.1 (H) 12.0 - 15.0 g/dL   HCT 47.4 (H) 36.0 - 46.0 %   MCV 85.6 78.0 - 100.0 fL   MCH 29.1 26.0 - 34.0 pg   MCHC 34.0 30.0 - 36.0 g/dL   RDW 15.4 11.5 - 15.5 %   Platelets 319 150 - 400 K/uL  Differential     Status: Abnormal   Collection Time: 07/24/14  2:52 PM  Result Value Ref Range   Neutrophils Relative % 85 (H) 43 - 77 %   Neutro Abs 16.1 (H) 1.7 - 7.7 K/uL   Lymphocytes Relative 7 (L) 12 - 46 %   Lymphs Abs 1.4 0.7 - 4.0 K/uL   Monocytes Relative 8 3 - 12 %   Monocytes Absolute 1.5 (H) 0.1 - 1.0 K/uL   Eosinophils Relative 0 0 - 5 %   Eosinophils Absolute 0.0 0.0 - 0.7 K/uL   Basophils Relative 0 0 - 1 %   Basophils Absolute 0.0 0.0 - 0.1 K/uL  Comprehensive metabolic panel     Status: Abnormal   Collection Time: 07/24/14  2:52 PM  Result Value Ref Range  Sodium 138 135 - 145 mmol/L   Potassium 3.7 3.5 - 5.1 mmol/L   Chloride 103 96 - 112 mmol/L   CO2 24 19 - 32 mmol/L   Glucose, Bld 196 (H) 70 - 99 mg/dL   BUN 18 6 - 23 mg/dL   Creatinine, Ser 0.88 0.50 - 1.10 mg/dL   Calcium 9.4 8.4 - 10.5 mg/dL   Total Protein 7.5 6.0 - 8.3 g/dL   Albumin 3.6 3.5 - 5.2 g/dL   AST 42 (H) 0 - 37 U/L   ALT 26 0 - 35 U/L   Alkaline Phosphatase 89 39 - 117 U/L   Total Bilirubin 0.6 0.3 - 1.2 mg/dL   GFR calc non Af Amer 68 (L) >90 mL/min   GFR calc Af Amer 79 (L) >90 mL/min   Anion gap 11 5 - 15  CK      Status: Abnormal   Collection Time: 07/24/14  2:52 PM  Result Value Ref Range   Total CK 510 (H) 7 - 177 U/L  I-Stat Troponin, ED (not at [facility])     Status: Abnormal   Collection Time: 07/24/14  3:16 PM  Result Value Ref Range   Troponin i, poc 1.82 (HH) 0.00 - 0.08 ng/mL   Comment NOTIFIED PHYSICIAN    Comment 3          I-Stat Chem 8, ED     Status: Abnormal   Collection Time: 07/24/14  3:18 PM  Result Value Ref Range   Sodium 139 135 - 145 mmol/L   Potassium 3.7 3.5 - 5.1 mmol/L   Chloride 103 96 - 112 mmol/L   BUN 22 6 - 23 mg/dL   Creatinine, Ser 0.80 0.50 - 1.10 mg/dL   Glucose, Bld 197 (H) 70 - 99 mg/dL   Calcium, Ion 1.19 1.13 - 1.30 mmol/L   TCO2 21 0 - 100 mmol/L   Hemoglobin 18.4 (H) 12.0 - 15.0 g/dL   HCT 54.0 (H) 36.0 - 46.0 %  Urine Drug Screen     Status: None   Collection Time: 07/24/14  4:16 PM  Result Value Ref Range   Opiates NONE DETECTED NONE DETECTED   Cocaine NONE DETECTED NONE DETECTED   Benzodiazepines NONE DETECTED NONE DETECTED   Amphetamines NONE DETECTED NONE DETECTED   Tetrahydrocannabinol NONE DETECTED NONE DETECTED   Barbiturates NONE DETECTED NONE DETECTED  Urinalysis, Routine w reflex microscopic     Status: Abnormal   Collection Time: 07/24/14  4:16 PM  Result Value Ref Range   Color, Urine YELLOW YELLOW   APPearance CLEAR CLEAR   Specific Gravity, Urine 1.023 1.005 - 1.030   pH 5.5 5.0 - 8.0   Glucose, UA 100 (A) NEGATIVE mg/dL   Hgb urine dipstick NEGATIVE NEGATIVE   Bilirubin Urine NEGATIVE NEGATIVE   Ketones, ur 15 (A) NEGATIVE mg/dL   Protein, ur NEGATIVE NEGATIVE mg/dL   Urobilinogen, UA 0.2 0.0 - 1.0 mg/dL   Nitrite NEGATIVE NEGATIVE   Leukocytes, UA NEGATIVE NEGATIVE   Ct Angio Head W/cm &/or Wo Cm  07/24/2014   CLINICAL DATA:  Stroke. Acute onset left-sided weakness. Facial droop and slurred speech. Last known normal last night.  EXAM: CT ANGIOGRAPHY HEAD AND NECK  TECHNIQUE: Multidetector CT imaging of the head and neck was  performed using the standard protocol during bolus administration of intravenous contrast. Multiplanar CT image reconstructions and MIPs were obtained to evaluate the vascular anatomy. Carotid stenosis measurements (when applicable) are obtained utilizing NASCET criteria,  using the distal internal carotid diameter as the denominator.  CONTRAST:  127mL OMNIPAQUE IOHEXOL 350 MG/ML SOLN  There is contrast extravasation during the first injection, which was approximately 70 mL of Omnipaque 350. Patient received a new IV and underwent a second injection without incident. Patient was evaluated by Dr. Pascal Lux of radiology.  COMPARISON:  None.  FINDINGS: CT HEAD  Brain: There are multiple regions of cytotoxic edema in the right MCA territory consistent with areas of acute to subacute infarction. These involve the posterior right insula, operculum, frontal lobe, and parietal lobe. No acute intracranial hemorrhage, mass, midline shift, or extra-axial fluid collection is seen. Ventricles and sulci are normal for age.  Calvarium and skull base: No aggressive osseous lesions.  Paranasal sinuses: Small left maxillary sinus mucous retention cyst. Clear mastoid air cells.  Orbits: Unremarkable.  CTA NECK  Aortic arch: 3 vessel aortic arch with moderate, predominantly noncalcified plaque. Brachiocephalic and right subclavian artery are patent without stenosis. There is mild-to-moderate plaque at the left subclavian artery origin without stenosis.  Right carotid system: Common carotid artery is patent without proximal stenosis. There is a large amount of mixed calcified and noncalcified plaque at the carotid bifurcation which results in severe stenosis of the internal carotid artery origin (radiographic string sign), with the residual patent lumen measuring no more than 1 mm in diameter over a distance of approximately 3 mm. Remainder of the cervical ICA is patent although slightly smaller in caliber compared to the contralateral side.   Left carotid system: Common carotid artery is patent without stenosis and is tortuous proximally. There is mild-to-moderate involving the carotid bifurcation proximal ICA without stenosis. Remainder of the cervical ICA is unremarkable.  Vertebral arteries:Vertebral arteries are patent with the left being slightly larger than the right and without vertebral artery stenosis identified.  Skeleton: Moderate multilevel disc degeneration in the mid and lower cervical spine.  Other neck: Centrilobular emphysema in the lung apices. No neck mass.  CTA HEAD  Anterior circulation: Internal carotid arteries are patent from skullbase to carotid termini. There is mild right and moderate left carotid siphon atherosclerotic plaque without significant stenosis. There is mild narrowing at the right carotid terminus with involvement of the ACA and MCA origins. There is irregular, mild narrowing of the right M1 segment which is small in caliber diffusely. Right MCA bifurcation is patent, however there is occlusion of the M2 inferior division proximally. Left M1 segment and major left MCA branch vessels are patent without evidence of significant stenosis. Left A1 segment is mildly dominant. Anterior communicating artery is patent. There is mild bilateral A2 irregularity. No intracranial aneurysm is identified.  Posterior circulation: Intracranial vertebral arteries are patent to the basilar with mild irregularity bilaterally but no significant stenosis. Left PICA origin is patent. Right PICA may share a common origin with the right AICA. AICA origins are patent bilaterally with the right being dominant. SCA origins are patent. Basilar artery is patent without stenosis. There is a patent left posterior communicating artery. There may be a tiny right posterior communicating artery. P1 segments are patent without stenosis. There is moderate proximal right P2 stenosis. Mild PCA branch vessel irregularity is present bilaterally.  Venous  sinuses: Patent.  Anatomic variants: None.  Delayed phase: Minimal cortical enhancement associated with some of the areas infarction, most notably in the right parietal lobe.  IMPRESSION: 1. Acute right MCA territory infarcts. No intracranial hemorrhage identified. 2. Critical proximal right ICA stenosis. 3. Right MCA M2 inferior division occlusion.  4. Moderate right P2 PCA stenosis.  These results were called by telephone at the time of interpretation on 07/24/2014 at 7:06 pm to Dr. Serita Grit , who verbally acknowledged these results.   Electronically Signed   By: Logan Bores   On: 07/24/2014 19:10   Ct Angio Neck W/cm &/or Wo/cm  07/24/2014   CLINICAL DATA:  Stroke. Acute onset left-sided weakness. Facial droop and slurred speech. Last known normal last night.  EXAM: CT ANGIOGRAPHY HEAD AND NECK  TECHNIQUE: Multidetector CT imaging of the head and neck was performed using the standard protocol during bolus administration of intravenous contrast. Multiplanar CT image reconstructions and MIPs were obtained to evaluate the vascular anatomy. Carotid stenosis measurements (when applicable) are obtained utilizing NASCET criteria, using the distal internal carotid diameter as the denominator.  CONTRAST:  11mL OMNIPAQUE IOHEXOL 350 MG/ML SOLN  There is contrast extravasation during the first injection, which was approximately 70 mL of Omnipaque 350. Patient received a new IV and underwent a second injection without incident. Patient was evaluated by Dr. Pascal Lux of radiology.  COMPARISON:  None.  FINDINGS: CT HEAD  Brain: There are multiple regions of cytotoxic edema in the right MCA territory consistent with areas of acute to subacute infarction. These involve the posterior right insula, operculum, frontal lobe, and parietal lobe. No acute intracranial hemorrhage, mass, midline shift, or extra-axial fluid collection is seen. Ventricles and sulci are normal for age.  Calvarium and skull base: No aggressive osseous  lesions.  Paranasal sinuses: Small left maxillary sinus mucous retention cyst. Clear mastoid air cells.  Orbits: Unremarkable.  CTA NECK  Aortic arch: 3 vessel aortic arch with moderate, predominantly noncalcified plaque. Brachiocephalic and right subclavian artery are patent without stenosis. There is mild-to-moderate plaque at the left subclavian artery origin without stenosis.  Right carotid system: Common carotid artery is patent without proximal stenosis. There is a large amount of mixed calcified and noncalcified plaque at the carotid bifurcation which results in severe stenosis of the internal carotid artery origin (radiographic string sign), with the residual patent lumen measuring no more than 1 mm in diameter over a distance of approximately 3 mm. Remainder of the cervical ICA is patent although slightly smaller in caliber compared to the contralateral side.  Left carotid system: Common carotid artery is patent without stenosis and is tortuous proximally. There is mild-to-moderate involving the carotid bifurcation proximal ICA without stenosis. Remainder of the cervical ICA is unremarkable.  Vertebral arteries:Vertebral arteries are patent with the left being slightly larger than the right and without vertebral artery stenosis identified.  Skeleton: Moderate multilevel disc degeneration in the mid and lower cervical spine.  Other neck: Centrilobular emphysema in the lung apices. No neck mass.  CTA HEAD  Anterior circulation: Internal carotid arteries are patent from skullbase to carotid termini. There is mild right and moderate left carotid siphon atherosclerotic plaque without significant stenosis. There is mild narrowing at the right carotid terminus with involvement of the ACA and MCA origins. There is irregular, mild narrowing of the right M1 segment which is small in caliber diffusely. Right MCA bifurcation is patent, however there is occlusion of the M2 inferior division proximally. Left M1 segment  and major left MCA branch vessels are patent without evidence of significant stenosis. Left A1 segment is mildly dominant. Anterior communicating artery is patent. There is mild bilateral A2 irregularity. No intracranial aneurysm is identified.  Posterior circulation: Intracranial vertebral arteries are patent to the basilar with mild irregularity bilaterally but  no significant stenosis. Left PICA origin is patent. Right PICA may share a common origin with the right AICA. AICA origins are patent bilaterally with the right being dominant. SCA origins are patent. Basilar artery is patent without stenosis. There is a patent left posterior communicating artery. There may be a tiny right posterior communicating artery. P1 segments are patent without stenosis. There is moderate proximal right P2 stenosis. Mild PCA branch vessel irregularity is present bilaterally.  Venous sinuses: Patent.  Anatomic variants: None.  Delayed phase: Minimal cortical enhancement associated with some of the areas infarction, most notably in the right parietal lobe.  IMPRESSION: 1. Acute right MCA territory infarcts. No intracranial hemorrhage identified. 2. Critical proximal right ICA stenosis. 3. Right MCA M2 inferior division occlusion. 4. Moderate right P2 PCA stenosis.  These results were called by telephone at the time of interpretation on 07/24/2014 at 7:06 pm to Dr. Serita Grit , who verbally acknowledged these results.   Electronically Signed   By: Logan Bores   On: 07/24/2014 19:10   Ct Angio Chest Aorta W/cm &/or Wo/cm  07/24/2014   CLINICAL DATA:  Stroke.  EXAM: CT ANGIOGRAPHY CHEST WITH CONTRAST  TECHNIQUE: Multidetector CT imaging of the chest was performed using the standard protocol during bolus administration of intravenous contrast. Multiplanar CT image reconstructions and MIPs were obtained to evaluate the vascular anatomy.  CONTRAST:  158mL OMNIPAQUE IOHEXOL 350 MG/ML SOLN  COMPARISON:  None.  FINDINGS: THORACIC  INLET/BODY WALL:  No acute abnormality.  MEDIASTINUM:  Mild cardiomegaly. No cardiac findings to explain stroke. No pericardial effusion. There is extensive atherosclerosis of the aorta with irregular plaque mainly involving the distal arch and descending aorta. There is no ascending aorta ulcerated plaque or dissection to explain the right carotid circulation infarct. The great vessels are widely patent. The pulmonary arteries are not evaluated due to contrast timing.  LUNG WINDOWS:  Lucencies in the upper lungs have some central vessels, but no definite perceptible wall. These are likely emphysematous spaces. There is a 4 mm noncalcified pulmonary nodule along the lower left major fissure on image 58. There is a 6 mm pulmonary nodule in the right middle lobe on image 52. A smaller pulmonary nodule is present in the left upper lobe on image 30.  UPPER ABDOMEN:  45 mm mass in the left adrenal gland with 2 foci of macroscopic fat. There is also calcification. The remainder of the nodule is fairly low-density. There is some lobulation but the margins appear distinct. 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 This type of internal fat would be unusual within a cortical carcinoma.  Cholelithiasis and probable 2 mm gallbladder polyp.  OSSEOUS:  No acute fracture.  No suspicious lytic or blastic lesions.  Review of the MIP images confirms the above findings.  IMPRESSION: 1. No great vessel or ascending aorta findings to explain cerebral infarct. 2. Extensive atherosclerosis. 3. Emphysema with bilateral pulmonary nodules, up to 6 mm on the right. Given risk factors for bronchogenic carcinoma, follow-up chest CT at 6 - 12 months is recommended. This recommendation follows the consensus statement: Guidelines for Management of Small Pulmonary Nodules Detected on CT Scans: A Statement from the [employer] as published in Radiology 2005;237:395-400. 4. 45 mm left adrenal mass with internal  fat, most consistent with myelolipoma. 5. Cholelithiasis and probable 3 mm gallbladder polyp.   Electronically Signed   By: Monte Fantasia M.D.   On: 07/24/2014 18:45  ASSESSMENT: 64 yo woman with HTN, hypothyroidism and tobacco abuse presents with acute right MCA stroke and found to have a positive troponin.  She denies any anginal or anginal equivalent symptoms making ACS less likely.  Her ECG does have STT changes in the lateral leads which could be ischemic but likely represent LVH with repolarization abnormalities, however, she does have more prominent TWI in the right precordial leads that could suggest ischemia.  That said, ECG changes as such can be seen secondary to CVA as can mild elevation in troponin.  In the absence of any symptoms, I am more inclined to think this may be related to CNS process at this time such as Takotsubo cardiomyopathy.   PLAN/DISCUSSION: - Recommend ASA as per neurology and primary team. - cycle troponins to peak - would consider statin therapy once CK normalizes as she has had previously documented elevations of LDL - echocardiogram in AM - If troponins were to continue to rise significantly, would recommend discussing scenario with neurology to see if they would be in agreement with anticoagulation given size of stroke and concern for hemorrhagic transformation in such setting.  - needs risk factor modification (lipids and glycemia not well controlled) - needs tobacco cessation counseling

## 2014-07-24 NOTE — ED Notes (Signed)
Elevated troponin of 1.82 reported to Dr. Doy Mince

## 2014-07-25 ENCOUNTER — Inpatient Hospital Stay (HOSPITAL_COMMUNITY): Payer: BLUE CROSS/BLUE SHIELD

## 2014-07-25 DIAGNOSIS — I63411 Cerebral infarction due to embolism of right middle cerebral artery: Principal | ICD-10-CM

## 2014-07-25 DIAGNOSIS — E1159 Type 2 diabetes mellitus with other circulatory complications: Secondary | ICD-10-CM

## 2014-07-25 DIAGNOSIS — R531 Weakness: Secondary | ICD-10-CM | POA: Diagnosis present

## 2014-07-25 DIAGNOSIS — I639 Cerebral infarction, unspecified: Secondary | ICD-10-CM | POA: Insufficient documentation

## 2014-07-25 DIAGNOSIS — I6521 Occlusion and stenosis of right carotid artery: Secondary | ICD-10-CM

## 2014-07-25 DIAGNOSIS — I6529 Occlusion and stenosis of unspecified carotid artery: Secondary | ICD-10-CM | POA: Insufficient documentation

## 2014-07-25 DIAGNOSIS — R7989 Other specified abnormal findings of blood chemistry: Secondary | ICD-10-CM

## 2014-07-25 DIAGNOSIS — E785 Hyperlipidemia, unspecified: Secondary | ICD-10-CM

## 2014-07-25 DIAGNOSIS — Z720 Tobacco use: Secondary | ICD-10-CM

## 2014-07-25 DIAGNOSIS — I10 Essential (primary) hypertension: Secondary | ICD-10-CM | POA: Insufficient documentation

## 2014-07-25 LAB — CBC
HEMATOCRIT: 47.8 % — AB (ref 36.0–46.0)
Hemoglobin: 16 g/dL — ABNORMAL HIGH (ref 12.0–15.0)
MCH: 28.8 pg (ref 26.0–34.0)
MCHC: 33.5 g/dL (ref 30.0–36.0)
MCV: 86 fL (ref 78.0–100.0)
Platelets: 331 10*3/uL (ref 150–400)
RBC: 5.56 MIL/uL — ABNORMAL HIGH (ref 3.87–5.11)
RDW: 15.5 % (ref 11.5–15.5)
WBC: 15.5 10*3/uL — AB (ref 4.0–10.5)

## 2014-07-25 LAB — TROPONIN I
TROPONIN I: 3.07 ng/mL — AB (ref <0.031)
Troponin I: 3.71 ng/mL (ref <0.031)

## 2014-07-25 LAB — TSH: TSH: 2.151 u[IU]/mL (ref 0.350–4.500)

## 2014-07-25 LAB — CK TOTAL AND CKMB (NOT AT ARMC)
CK, MB: 16.7 ng/mL (ref 0.3–4.0)
Relative Index: 7.3 — ABNORMAL HIGH (ref 0.0–2.5)
Total CK: 229 U/L — ABNORMAL HIGH (ref 7–177)

## 2014-07-25 LAB — GLUCOSE, CAPILLARY
GLUCOSE-CAPILLARY: 175 mg/dL — AB (ref 70–99)
Glucose-Capillary: 170 mg/dL — ABNORMAL HIGH (ref 70–99)
Glucose-Capillary: 144 mg/dL — ABNORMAL HIGH (ref 70–99)

## 2014-07-25 LAB — LIPID PANEL
CHOLESTEROL: 317 mg/dL — AB (ref 0–200)
HDL: 46 mg/dL (ref >39)
LDL Cholesterol: 240 mg/dL — ABNORMAL HIGH (ref 0–99)
Total CHOL/HDL Ratio: 6.9 RATIO
Triglycerides: 154 mg/dL — ABNORMAL HIGH (ref <150)
VLDL: 31 mg/dL (ref 0–40)

## 2014-07-25 LAB — MRSA PCR SCREENING: MRSA BY PCR: NEGATIVE

## 2014-07-25 MED ORDER — INSULIN ASPART 100 UNIT/ML ~~LOC~~ SOLN
0.0000 [IU] | Freq: Three times a day (TID) | SUBCUTANEOUS | Status: DC
  Administered 2014-07-25 – 2014-07-27 (×5): 2 [IU] via SUBCUTANEOUS
  Administered 2014-07-27: 5 [IU] via SUBCUTANEOUS
  Administered 2014-07-27 – 2014-07-28 (×3): 2 [IU] via SUBCUTANEOUS
  Administered 2014-07-28 – 2014-07-29 (×2): 1 [IU] via SUBCUTANEOUS
  Administered 2014-07-29 – 2014-07-30 (×3): 2 [IU] via SUBCUTANEOUS
  Administered 2014-07-30: 1 [IU] via SUBCUTANEOUS

## 2014-07-25 MED ORDER — ASPIRIN 325 MG PO TABS
325.0000 mg | ORAL_TABLET | Freq: Every day | ORAL | Status: DC
  Administered 2014-07-25: 325 mg via ORAL
  Filled 2014-07-25 (×2): qty 1

## 2014-07-25 MED ORDER — CETYLPYRIDINIUM CHLORIDE 0.05 % MT LIQD
7.0000 mL | Freq: Two times a day (BID) | OROMUCOSAL | Status: DC
  Administered 2014-07-25 – 2014-07-28 (×8): 7 mL via OROMUCOSAL

## 2014-07-25 MED ORDER — ASPIRIN 300 MG RE SUPP
300.0000 mg | Freq: Every day | RECTAL | Status: DC
  Filled 2014-07-25 (×2): qty 1

## 2014-07-25 MED ORDER — STARCH (THICKENING) PO POWD
ORAL | Status: DC | PRN
  Administered 2014-07-28: 17:00:00 via ORAL
  Filled 2014-07-25: qty 227

## 2014-07-25 MED ORDER — CLOPIDOGREL BISULFATE 75 MG PO TABS: 75.0000 mg | ORAL_TABLET | Freq: Every day | ORAL | Status: DC

## 2014-07-25 MED ORDER — INSULIN ASPART 100 UNIT/ML ~~LOC~~ SOLN: 0.0000 [IU] | Freq: Every day | SUBCUTANEOUS | Status: DC

## 2014-07-25 MED ORDER — CLOPIDOGREL BISULFATE 300 MG PO TABS
300.0000 mg | ORAL_TABLET | Freq: Once | ORAL | Status: DC
  Filled 2014-07-25: qty 1

## 2014-07-25 MED ORDER — ATORVASTATIN CALCIUM 40 MG PO TABS
40.0000 mg | ORAL_TABLET | Freq: Every day | ORAL | Status: DC
  Administered 2014-07-25: 40 mg via ORAL
  Filled 2014-07-25 (×2): qty 1

## 2014-07-25 MED ORDER — HEPARIN SODIUM (PORCINE) 5000 UNIT/ML IJ SOLN
5000.0000 [IU] | Freq: Three times a day (TID) | INTRAMUSCULAR | Status: DC
  Administered 2014-07-25 – 2014-07-30 (×16): 5000 [IU] via SUBCUTANEOUS
  Filled 2014-07-25 (×16): qty 1

## 2014-07-25 NOTE — Clinical Social Work Note (Signed)
CSW Consult Acknowledged:   CSW received a consult for SNF placement. CSW awaiting PT/OT evaluation to determine the appropriate level of care.      Amanat Hackel, MSW, LCSWA [phone]  

## 2014-07-25 NOTE — Progress Notes (Signed)
Set up patient home CPAP equipment. No frayed wires noted.  Placed patient on CPAP via nasal pillows, home settings with 2 lpm O2 bleed in. RN aware to put in request order for [employer] to check equipment this morning.

## 2014-07-25 NOTE — Progress Notes (Signed)
Family Practice Teaching Service Interval Progress Note  Went to see patient regarding NG tube placement. She has been refusing placement since it was attempted in the ED earlier this morning. She notes this was uncomfortable. I discussed that it was important to have this placed as it was the only method to get the plavix in her system since she failed her swallow screen. I advised that with out the medication (plavix) her heart damage could get worse and develop in to a heart attack or result in death. She still declined this. I asked nursing if this screen could be repeated, though they stated once you fail one time you have to be evaluated by SLP prior to getting the status changed. I relayed this information to the patient and she still declined. She stated that she felt as though she could swallow since she was not drooling. I advised there was nothing we could do at this time to change her failed swallow study. I advised that with out the medication (plavix) her heart damage could get worse and develop in to a heart attack or result in death. I advised her that the only way to get this necessary medication at this time is through an NG tube. After reinforcing the consequences with her and that the NG tube is the only route, she finally stated that it was ok to place the NG tube. I informed nursing of this.  Tommi Rumps, MD Family Medicine PGY-3 Service Pager [phone]

## 2014-07-25 NOTE — Progress Notes (Signed)
After 2 attempts to pass NG tube, patient declined to have NG inserted. MD notified. Will continue to monitor.   Lum Babe, RN

## 2014-07-25 NOTE — Evaluation (Signed)
Physical Therapy Evaluation Patient Details Name: Carla Hopkins MRN: [id number] DOB: 1950/08/18 Today's Date: 07/25/2014   History of Present Illness  Patient is a 64 y/o female admitted with left sided weakness postive for right MCA CVA.  PMH positive for HLD, HTN, DM and hypothyroidism.  Clinical Impression  Patient presents with decreased independence and safety with mobility due to deficits listed in PT problem list.  She will benefit from skilled PT in the acute setting to allow return home alone following rehab stay.    Follow Up Recommendations CIR    Equipment Recommendations  Other (comment) (To be assessed)    Recommendations for Other Services Rehab consult     Precautions / Restrictions Precautions Precautions: Fall Precaution Comments: left neglect/inattention      Mobility  Bed Mobility Overal bed mobility: Needs Assistance Bed Mobility: Supine to Sit     Supine to sit: Mod assist;HOB elevated     General bed mobility comments: assist to lift trunk and scoot hips out to edge of bed  Transfers Overall transfer level: Needs assistance Equipment used: 1 person hand held assist Transfers: Stand Pivot Transfers;Sit to/from Stand Sit to Stand: Min assist Stand pivot transfers: Min assist       General transfer comment: cues for technique, to slow down and for proper positioning in chair prior to sitting   Ambulation/Gait                Stairs            Wheelchair Mobility    Modified Rankin (Stroke Patients Only) Modified Rankin (Stroke Patients Only) Pre-Morbid Rankin Score: No symptoms Modified Rankin: Moderately severe disability     Balance Overall balance assessment: Needs assistance   Sitting balance-Leahy Scale: Good Sitting balance - Comments: accepts challenges, but reaching to left minguard for safety when outside base of support     Standing balance-Leahy Scale: Poor Standing balance comment: stands static with  minguard assist due to left lateral lean                             Pertinent Vitals/Pain Pain Assessment: Faces Faces Pain Scale: Hurts little more Pain Location: left thigh with movement Pain Descriptors / Indicators: Sore Pain Intervention(s): Monitored during session    Home Living Family/patient expects to be discharged to:: Inpatient rehab Living Arrangements: Alone (daughter works in [place])   Type of Home: House ([place]) Home Access: Stairs to enter Entrance Stairs-Rails: None Technical brewer of Steps: 3 Home Layout: One level Home Equipment: None      Prior Function Level of Independence: Independent         Comments: works for [employer]   Dominant Hand: Right    Extremity/Trunk Assessment   Upper Extremity Assessment: Defer to OT evaluation           Lower Extremity Assessment: LLE deficits/detail;RLE deficits/detail RLE Deficits / Details: AROM WFL, strength hip flexion 4-/5 due to pain, knee extension 4/5, ankle DF 4+/5 LLE Deficits / Details: AAROM WFL, strength hip flexion 3/5, knee extension 4-/5 ankle DF 3+/5     Communication   Communication: Expressive difficulties (dysarthria)  Cognition Arousal/Alertness: Awake/alert Behavior During Therapy: Flat affect Overall Cognitive Status: Impaired/Different from baseline Area of Impairment: Attention;Awareness;Safety/judgement   Current Attention Level: Selective     Safety/Judgement: Decreased awareness of safety Awareness: Emergent   General Comments: decreasd left side awareness/attention  General Comments      Exercises        Assessment/Plan    PT Assessment Patient needs continued PT services  PT Diagnosis Abnormality of gait;Hemiplegia non-dominant side   PT Problem List Decreased strength;Decreased mobility;Decreased safety awareness;Decreased knowledge of use of DME;Decreased balance  PT Treatment Interventions DME  instruction;Therapeutic exercise;Gait training;Balance training;Functional mobility training;Patient/family education;Therapeutic activities   PT Goals (Current goals can be found in the Care Plan section) Acute Rehab PT Goals Patient Stated Goal: To go home PT Goal Formulation: With patient/family Time For Goal Achievement: 08/08/14 Potential to Achieve Goals: Good    Frequency Min 4X/week   Barriers to discharge Decreased caregiver support      Co-evaluation               End of Session Equipment Utilized During Treatment: Gait belt Activity Tolerance: Patient tolerated treatment well Patient left: in chair;with call bell/phone within reach;with family/visitor present Nurse Communication: Mobility status;Other (comment) (safety)         Time: 9150-5697 PT Time Calculation (min) (ACUTE ONLY): 25 min   Charges:   PT Evaluation $Initial PT Evaluation Tier I: 1 Procedure PT Treatments $Therapeutic Activity: 8-22 mins   PT G Codes:        WYNN,CYNDI 14-Aug-2014, 12:43 PM  Magda Kiel, [place] Aug 14, 2014

## 2014-07-25 NOTE — Clinical Social Work Placement (Signed)
   CLINICAL SOCIAL WORK PLACEMENT  NOTE  Date:  07/25/2014  Patient Details  Name: Carla Hopkins MRN: [id number] Date of Birth: Jul 01, 1950  Clinical Social Work is seeking post-discharge placement for this patient at the [place] level of care (*CSW will initial, date and re-position this form in  chart as items are completed):  Yes   Patient/family provided with [facility] Work Department's list of facilities offering this level of care within the geographic area requested by the patient (or if unable, by the patient's family).  Yes   Patient/family informed of their freedom to choose among providers that offer the needed level of care, that participate in Medicare, Medicaid or managed care program needed by the patient, have an available bed and are willing to accept the patient.  Yes   Patient/family informed of [facility]'s ownership interest in [facility] and [facility], as well as of the fact that they are under no obligation to receive care at these facilities.  PASRR submitted to EDS on 07/25/14     PASRR number received on 07/25/14     Existing PASRR number confirmed on       FL2 transmitted to all facilities in geographic area requested by pt/family on       FL2 transmitted to all facilities within larger geographic area on       Patient informed that his/her managed care company has contracts with or will negotiate with certain facilities, including the following:            Patient/family informed of bed offers received.  Patient chooses bed at       Physician recommends and patient chooses bed at      Patient to be transferred to   on  .  Patient to be transferred to facility by       Patient family notified on   of transfer.  Name of family member notified:        PHYSICIAN       Additional Comment:    _______________________________________________ Greta Doom, MSW, [place]

## 2014-07-25 NOTE — Consult Note (Signed)
[facility] HISTORY AND PHYSICAL  Requesting: Dr Jerline Pain [facility] Reason for consult: acute stroke with right carotid stenosis History of Present Illness:  Patient is a 64 y.o. female who presents for evaluation of symptomatic right internal carotid stenosis with stroke.  Pt admitted yesterday with acute onset slurred speech, left arm profound and left leg weakness.  CT showed greater than 80% stenosis right ICA, minimal left carotid stenosis.  No prior stroke or TIA or amaurosis.  She has had no improvement in speech or left arm at this point.  Other medical problems include hypertension, borderline diabetes, ? Elevated cholesterol, tobacco abuse.  She is currently on ASA, lipitor, sliding scale insulin.  She is supposed to be starting Plavix as well.  Past Medical History  Diagnosis Date  . Hypothyroidism   . Hypertension     Past Surgical History  Procedure Laterality Date  . Tubal ligation      Social History History  Substance Use Topics  . Smoking status: Current Some Day Smoker -- 20.00 packs/day    Types: Cigarettes  . Smokeless tobacco: Not on file  . Alcohol Use: Yes     Comment: ocassionally - wine    Family History Family History  Problem Relation Age of Onset  . Adopted: Yes    Allergies  No Known Allergies   Current Facility-Administered Medications  Medication Dose Route Frequency Provider Last Rate Last Dose  . antiseptic oral rinse (CPC / CETYLPYRIDINIUM CHLORIDE 0.05%) solution 7 mL  7 mL Mouth Rinse BID Kinnie Feil, MD   7 mL at 07/25/14 1235  . aspirin tablet 325 mg  325 mg Oral Daily Vivi Barrack, MD   325 mg at 07/25/14 1234   Or  . aspirin suppository 300 mg  300 mg Rectal Daily Vivi Barrack, MD      . atorvastatin (LIPITOR) tablet 40 mg  40 mg Oral q1800 Alyssa A Haney, MD      . heparin injection 5,000 Units  5,000 Units Subcutaneous 3 times per day Vivi Barrack, MD   5,000 Units at 07/25/14 1303  .  insulin aspart (novoLOG) injection 0-5 Units  0-5 Units Subcutaneous QHS Vivi Barrack, MD      . insulin aspart (novoLOG) injection 0-9 Units  0-9 Units Subcutaneous TID WC Vivi Barrack, MD   2 Units at 07/25/14 1248  . levothyroxine (SYNTHROID, LEVOTHROID) tablet 88 mcg  88 mcg Oral QAC breakfast Leone Haven, MD   88 mcg at 07/25/14 1235  . senna-docusate (Senokot-S) tablet 1 tablet  1 tablet Oral QHS PRN Leone Haven, MD        ROS:   General:  No weight loss, Fever, chills  HEENT: + recent headaches, no nasal bleeding, no visual changes, no sore throat  Neurologic: + dizziness, blackouts, seizures. No recent symptoms of stroke or mini- stroke. No recent episodes of slurred speech, or temporary blindness.  Cardiac: No recent episodes of chest pain/pressure, no shortness of breath at rest.  + shortness of breath with exertion.  Denies history of atrial fibrillation or irregular heartbeat  Vascular: No history of rest pain in feet.  No history of claudication.  No history of non-healing ulcer, No history of DVT   Pulmonary: No home oxygen, no productive cough, no hemoptysis,  No asthma or wheezing  Musculoskeletal:  [ ]  Arthritis, [ ]  Low back pain,  [ ]  Joint pain  Hematologic:No history of  hypercoagulable state.  No history of easy bleeding.  No history of anemia  Gastrointestinal: No hematochezia or melena,  No gastroesophageal reflux, no trouble swallowing  Urinary: [ ]  chronic Kidney disease, [ ]  on HD - [ ]  MWF or [ ]  TTHS, [ ]  Burning with urination, [ ]  Frequent urination, [ ]  Difficulty urinating;   Skin: No rashes  Psychological: No history of anxiety,  No history of depression   Physical Examination  Filed Vitals:   07/25/14 0312 07/25/14 0334 07/25/14 0739 07/25/14 1244  BP: 117/69 117/69 104/67 111/58  Pulse: 89 82 95   Temp: 98 F (36.7 C)  98.7 F (37.1 C) 98.3 F (36.8 C)  TempSrc: Oral  Oral Oral  Resp: 24 18 21 21   Height:      Weight:       SpO2: 95% 95% 95% 94%    Body mass index is 37.83 kg/(m^2).  General:  Alert and oriented, no acute distress HEENT: Normal Neck: No bruit or JVD Pulmonary: Clear to auscultation bilaterally Cardiac: Regular Rate and Rhythm without murmur Abdomen: Soft, non-tender, non-distended, no mass, obese Skin: diffuse macular rash bilateral lower extremities Extremity Pulses:  2+ radial, brachial, femoral, dorsalis pedis pulses bilaterally Musculoskeletal: No deformity or edema  Neurologic: No facial asymmetry left upper extremity 0/5, left lower extremity 4/5, right upper and lower extremity 5/5  DATA: CTA reviewed mid hyoid right ICA stenosis 80%, 50-60% left ICA, great vessels no real stenosis    ASSESSMENT:  Symptomatic right ICA stenosis with stroke   PLAN:  Will allow 2 weeks recovery with plan to proceed with CEA in 2 weeks if no decline in condition or new events.  Ruta Hinds, MD [facility] Office: [phone] Pager: [phone]

## 2014-07-25 NOTE — Progress Notes (Signed)
Rehab Admissions Coordinator Note:  Patient was screened by Cleatrice Burke for appropriateness for an Inpatient Acute Rehab Consult per PT recommendation  At this time, we are recommending Inpatient Rehab consult if you would like [place] to assess pt for a possible inpt rehab admission. Please place order.  Cleatrice Burke 07/25/2014, 2:32 PM  I can be reached at [phone].

## 2014-07-25 NOTE — Progress Notes (Signed)
Patient Name: Carla Hopkins Date of Encounter: 07/25/2014  Principal Problem:   CVA (cerebral infarction) Active Problems:   Hyperlipidemia   Diabetes   Elevated troponin   Tobacco abuse   Left-sided weakness   Primary Cardiologist: New  Patient Profile: 64 yo female w/ no previous cardiac hx, hx HTN, hypothyroid, DM, was admitted 04/48 w/ R MCA CVA. Cards seeing for elevated troponin  SUBJECTIVE: The patient denies chest pain or dyspnea. She has had no chest pain at any time recently.  OBJECTIVE Filed Vitals:   07/25/14 0140 07/25/14 0312 07/25/14 0334 07/25/14 0739  BP: 112/65 117/69 117/69 104/67  Pulse: 80 89 82 95  Temp: 98 F (36.7 C) 98 F (36.7 C)  98.7 F (37.1 C)  TempSrc: Oral Oral  Oral  Resp: 19 24 18 21   Height: 5' 2.5" (1.588 m)     Weight: 210 lb 5.1 oz (95.4 kg)     SpO2: 96% 95% 95% 95%    Intake/Output Summary (Last 24 hours) at 07/25/14 1125 Last data filed at 07/25/14 0900  Gross per 24 hour  Intake      0 ml  Output   1055 ml  Net  -1055 ml   Filed Weights   07/25/14 0140  Weight: 210 lb 5.1 oz (95.4 kg)    PHYSICAL EXAM General: Well developed, well nourished, overweight female in no acute distress. Head: Normocephalic, atraumatic.  Neck: Supple without bruits, JVD. Lungs:  Resp regular and unlabored, CTA. Heart: RRR, S1, S2, no S3, S4, grade 2/6 ejection murmur at the left sternal border Abdomen: Soft, non-tender, non-distended, BS + x 4.  Extremities: No clubbing, cyanosis, edema.  Neuro: Alert and oriented X 3. Left arm without movement Psych: Normal affect. Skin: There is a rash on both legs  LABS: CBC:  Recent Labs  07/24/14 1452 07/24/14 1518 07/25/14 0250  WBC 18.9*  --  15.5*  NEUTROABS 16.1*  --   --   HGB 16.1* 18.4* 16.0*  HCT 47.4* 54.0* 47.8*  MCV 85.6  --  86.0  PLT 319  --  331   INR:  Recent Labs  07/24/14 1452  INR 7.12   Basic Metabolic Panel:  Recent Labs  07/24/14 1452  07/24/14 1518  NA 138 139  K 3.7 3.7  CL 103 103  CO2 24  --   GLUCOSE 196* 197*  BUN 18 22  CREATININE 0.88 0.80  CALCIUM 9.4  --    Liver Function Tests:  Recent Labs  07/24/14 1452  AST 42*  ALT 26  ALKPHOS 89  BILITOT 0.6  PROT 7.5  ALBUMIN 3.6   Cardiac Enzymes:  Recent Labs  07/24/14 1452 07/24/14 2151 07/25/14 0250 07/25/14 0820  CKTOTAL 510*  --   --   --   TROPONINI  --  4.17* 3.71* 3.07*    Recent Labs  07/24/14 1516  TROPIPOC 1.82*   BNP: No results found for: BNP No results found for: PROBNP D-dimer:No results for input(s): DDIMER in the last 72 hours. Hemoglobin A1C:No results for input(s): HGBA1C in the last 72 hours. Fasting Lipid Panel:  Recent Labs  07/25/14 0016  CHOL 317*  HDL 46  LDLCALC 240*  TRIG 154*  CHOLHDL 6.9   TELE:      Personally reviewed, normal sinus rhythm  Radiology/Studies: Ct Angio Head W/cm &/or Wo Cm  07/24/2014   CLINICAL DATA:  Stroke. Acute onset left-sided weakness. Facial droop and slurred speech. Last  known normal last night.  EXAM: CT ANGIOGRAPHY HEAD AND NECK  TECHNIQUE: Multidetector CT imaging of the head and neck was performed using the standard protocol during bolus administration of intravenous contrast. Multiplanar CT image reconstructions and MIPs were obtained to evaluate the vascular anatomy. Carotid stenosis measurements (when applicable) are obtained utilizing NASCET criteria, using the distal internal carotid diameter as the denominator.  CONTRAST:  140mL OMNIPAQUE IOHEXOL 350 MG/ML SOLN  There is contrast extravasation during the first injection, which was approximately 70 mL of Omnipaque 350. Patient received a new IV and underwent a second injection without incident. Patient was evaluated by Dr. Pascal Lux of radiology.  COMPARISON:  None.  FINDINGS: CT HEAD  Brain: There are multiple regions of cytotoxic edema in the right MCA territory consistent with areas of acute to subacute infarction. These  involve the posterior right insula, operculum, frontal lobe, and parietal lobe. No acute intracranial hemorrhage, mass, midline shift, or extra-axial fluid collection is seen. Ventricles and sulci are normal for age.  Calvarium and skull base: No aggressive osseous lesions.  Paranasal sinuses: Small left maxillary sinus mucous retention cyst. Clear mastoid air cells.  Orbits: Unremarkable.  CTA NECK  Aortic arch: 3 vessel aortic arch with moderate, predominantly noncalcified plaque. Brachiocephalic and right subclavian artery are patent without stenosis. There is mild-to-moderate plaque at the left subclavian artery origin without stenosis.  Right carotid system: Common carotid artery is patent without proximal stenosis. There is a large amount of mixed calcified and noncalcified plaque at the carotid bifurcation which results in severe stenosis of the internal carotid artery origin (radiographic string sign), with the residual patent lumen measuring no more than 1 mm in diameter over a distance of approximately 3 mm. Remainder of the cervical ICA is patent although slightly smaller in caliber compared to the contralateral side.  Left carotid system: Common carotid artery is patent without stenosis and is tortuous proximally. There is mild-to-moderate involving the carotid bifurcation proximal ICA without stenosis. Remainder of the cervical ICA is unremarkable.  Vertebral arteries:Vertebral arteries are patent with the left being slightly larger than the right and without vertebral artery stenosis identified.  Skeleton: Moderate multilevel disc degeneration in the mid and lower cervical spine.  Other neck: Centrilobular emphysema in the lung apices. No neck mass.  CTA HEAD  Anterior circulation: Internal carotid arteries are patent from skullbase to carotid termini. There is mild right and moderate left carotid siphon atherosclerotic plaque without significant stenosis. There is mild narrowing at the right carotid  terminus with involvement of the ACA and MCA origins. There is irregular, mild narrowing of the right M1 segment which is small in caliber diffusely. Right MCA bifurcation is patent, however there is occlusion of the M2 inferior division proximally. Left M1 segment and major left MCA branch vessels are patent without evidence of significant stenosis. Left A1 segment is mildly dominant. Anterior communicating artery is patent. There is mild bilateral A2 irregularity. No intracranial aneurysm is identified.  Posterior circulation: Intracranial vertebral arteries are patent to the basilar with mild irregularity bilaterally but no significant stenosis. Left PICA origin is patent. Right PICA may share a common origin with the right AICA. AICA origins are patent bilaterally with the right being dominant. SCA origins are patent. Basilar artery is patent without stenosis. There is a patent left posterior communicating artery. There may be a tiny right posterior communicating artery. P1 segments are patent without stenosis. There is moderate proximal right P2 stenosis. Mild PCA branch vessel irregularity  is present bilaterally.  Venous sinuses: Patent.  Anatomic variants: None.  Delayed phase: Minimal cortical enhancement associated with some of the areas infarction, most notably in the right parietal lobe.  IMPRESSION: 1. Acute right MCA territory infarcts. No intracranial hemorrhage identified. 2. Critical proximal right ICA stenosis. 3. Right MCA M2 inferior division occlusion. 4. Moderate right P2 PCA stenosis.  These results were called by telephone at the time of interpretation on 07/24/2014 at 7:06 pm to Dr. Serita Grit , who verbally acknowledged these results.   Electronically Signed   By: Logan Bores   On: 07/24/2014 19:10   Ct Angio Neck W/cm &/or Wo/cm  07/24/2014   CLINICAL DATA:  Stroke. Acute onset left-sided weakness. Facial droop and slurred speech. Last known normal last night.  EXAM: CT ANGIOGRAPHY  HEAD AND NECK  TECHNIQUE: Multidetector CT imaging of the head and neck was performed using the standard protocol during bolus administration of intravenous contrast. Multiplanar CT image reconstructions and MIPs were obtained to evaluate the vascular anatomy. Carotid stenosis measurements (when applicable) are obtained utilizing NASCET criteria, using the distal internal carotid diameter as the denominator.  CONTRAST:  143mL OMNIPAQUE IOHEXOL 350 MG/ML SOLN  There is contrast extravasation during the first injection, which was approximately 70 mL of Omnipaque 350. Patient received a new IV and underwent a second injection without incident. Patient was evaluated by Dr. Pascal Lux of radiology.  COMPARISON:  None.  FINDINGS: CT HEAD  Brain: There are multiple regions of cytotoxic edema in the right MCA territory consistent with areas of acute to subacute infarction. These involve the posterior right insula, operculum, frontal lobe, and parietal lobe. No acute intracranial hemorrhage, mass, midline shift, or extra-axial fluid collection is seen. Ventricles and sulci are normal for age.  Calvarium and skull base: No aggressive osseous lesions.  Paranasal sinuses: Small left maxillary sinus mucous retention cyst. Clear mastoid air cells.  Orbits: Unremarkable.  CTA NECK  Aortic arch: 3 vessel aortic arch with moderate, predominantly noncalcified plaque. Brachiocephalic and right subclavian artery are patent without stenosis. There is mild-to-moderate plaque at the left subclavian artery origin without stenosis.  Right carotid system: Common carotid artery is patent without proximal stenosis. There is a large amount of mixed calcified and noncalcified plaque at the carotid bifurcation which results in severe stenosis of the internal carotid artery origin (radiographic string sign), with the residual patent lumen measuring no more than 1 mm in diameter over a distance of approximately 3 mm. Remainder of the cervical ICA is  patent although slightly smaller in caliber compared to the contralateral side.  Left carotid system: Common carotid artery is patent without stenosis and is tortuous proximally. There is mild-to-moderate involving the carotid bifurcation proximal ICA without stenosis. Remainder of the cervical ICA is unremarkable.  Vertebral arteries:Vertebral arteries are patent with the left being slightly larger than the right and without vertebral artery stenosis identified.  Skeleton: Moderate multilevel disc degeneration in the mid and lower cervical spine.  Other neck: Centrilobular emphysema in the lung apices. No neck mass.  CTA HEAD  Anterior circulation: Internal carotid arteries are patent from skullbase to carotid termini. There is mild right and moderate left carotid siphon atherosclerotic plaque without significant stenosis. There is mild narrowing at the right carotid terminus with involvement of the ACA and MCA origins. There is irregular, mild narrowing of the right M1 segment which is small in caliber diffusely. Right MCA bifurcation is patent, however there is occlusion of the M2 inferior  division proximally. Left M1 segment and major left MCA branch vessels are patent without evidence of significant stenosis. Left A1 segment is mildly dominant. Anterior communicating artery is patent. There is mild bilateral A2 irregularity. No intracranial aneurysm is identified.  Posterior circulation: Intracranial vertebral arteries are patent to the basilar with mild irregularity bilaterally but no significant stenosis. Left PICA origin is patent. Right PICA may share a common origin with the right AICA. AICA origins are patent bilaterally with the right being dominant. SCA origins are patent. Basilar artery is patent without stenosis. There is a patent left posterior communicating artery. There may be a tiny right posterior communicating artery. P1 segments are patent without stenosis. There is moderate proximal right P2  stenosis. Mild PCA branch vessel irregularity is present bilaterally.  Venous sinuses: Patent.  Anatomic variants: None.  Delayed phase: Minimal cortical enhancement associated with some of the areas infarction, most notably in the right parietal lobe.  IMPRESSION: 1. Acute right MCA territory infarcts. No intracranial hemorrhage identified. 2. Critical proximal right ICA stenosis. 3. Right MCA M2 inferior division occlusion. 4. Moderate right P2 PCA stenosis.  These results were called by telephone at the time of interpretation on 07/24/2014 at 7:06 pm to Dr. Serita Grit , who verbally acknowledged these results.   Electronically Signed   By: Logan Bores   On: 07/24/2014 19:10   Mr Brain Wo Contrast  07/25/2014   CLINICAL DATA:  Initial evaluation for acute left-sided weakness.  EXAM: MRI HEAD WITHOUT CONTRAST  MRA HEAD WITHOUT CONTRAST  TECHNIQUE: Multiplanar, multiecho pulse sequences of the brain and surrounding structures were obtained without intravenous contrast. Angiographic images of the head were obtained using MRA technique without contrast.  COMPARISON:  Prior CT and CTA from 07/24/2014.  FINDINGS: MRI HEAD FINDINGS  Patchy multi focal areas of restricted diffusion again seen throughout the right frontal and parietal lobes, compatible with acute right MCA territory infarct. There is involvement of the right insular cortex. Infarct involves the cortical gray matter as well as the deep and subcortical white matter. There is a 13 mm infarct within the right thalamus. There is localized cytotoxic edema without significant mass effect. Overall, distribution is stable from prior CT. There is minimal associated petechial hemorrhage without frank hemorrhagic conversion. There is an additional 8 mm cortical infarct within the parasagittal anterior right frontal lobe (series 5, image 30), likely ACA distribution. No left cerebral or infratentorial infarcts.  No mass lesion or midline shift. No hydrocephalus.  No extra-axial fluid collection.  Craniocervical junction within normal limits. Pituitary gland normal.  No acute abnormality about the orbits. Paranasal sinuses are clear. No mastoid effusion. Inner ear structures are normal.  Cerebral atrophy with mild chronic small vessel ischemic changes present in the periventricular white matter. Small vessel type changes present within the pons.  MRA HEAD FINDINGS  ANTERIOR CIRCULATION:  Visualized distal cervical segments of the internal carotid arteries are patent with antegrade flow. There is mildly attenuated flow within the right ICA as compared to the left, likely related to the critical stenosis proximally within the neck. This involves the right MCA and its distal branches as well. The petrous and cavernous segments of the internal carotid arteries are widely patent. Mild narrowing of the supra clinoid right ICA. This extends into the proximal right M1 and A1 segments. Left A1 segment is dominant. Anterior communicating artery and anterior cerebral arteries patent.  Left M1 segment is widely patent. Left MCA bifurcation and distal MCA branches  well opacified.  There is somewhat attenuated and irregular flow within the right M1 segment without focal high-grade stenosis or frank occlusion. Occlusion of the inferior division of the proximal right M2 branch noted, better evaluated on prior CTA. Flow within the distal right MCA branches is attenuated.  POSTERIOR CIRCULATION:  Vertebral arteries are patent to the vertebrobasilar junction. Partially visualized posterior inferior cerebral arteries patent. Basilar artery widely patent. Superior cerebellar arteries patent proximally. Posterior cerebral arteries opacified to their distal aspects. There is a moderate irregular stenosis within the proximal right P2 segment.  No aneurysm.  IMPRESSION: MRI HEAD IMPRESSION:  1. Multi focal patchy acute right MCA territory infarcts. Overall distribution is stable from prior CT. There  is very minimal associated petechial hemorrhage without evidence for hemorrhagic conversion. 2. Additional 8 mm cortical infarct within the parasagittal cortical gray matter of the anterior right frontal lobe, likely right ACA in distribution. 3. Mild cerebral atrophy with chronic microvascular ischemic disease.  MRA HEAD IMPRESSION:  1. Attenuated flow throughout the visualized right ICA and MCA arteries, likely related to the critical stenosis seen proximally within the neck on prior CTA. Flow within the right M1 segment is thready and irregular, with attenuated distal right MCA branch flow. 2. Right MCA M2 inferior division occlusion, stable from prior CTA. 3. Irregular moderate right P2 stenosis.   Electronically Signed   By: Jeannine Boga M.D.   On: 07/25/2014 02:07   Mr Jodene Nam Head/brain Wo Cm  07/25/2014   CLINICAL DATA:  Initial evaluation for acute left-sided weakness.  EXAM: MRI HEAD WITHOUT CONTRAST  MRA HEAD WITHOUT CONTRAST  TECHNIQUE: Multiplanar, multiecho pulse sequences of the brain and surrounding structures were obtained without intravenous contrast. Angiographic images of the head were obtained using MRA technique without contrast.  COMPARISON:  Prior CT and CTA from 07/24/2014.  FINDINGS: MRI HEAD FINDINGS  Patchy multi focal areas of restricted diffusion again seen throughout the right frontal and parietal lobes, compatible with acute right MCA territory infarct. There is involvement of the right insular cortex. Infarct involves the cortical gray matter as well as the deep and subcortical white matter. There is a 13 mm infarct within the right thalamus. There is localized cytotoxic edema without significant mass effect. Overall, distribution is stable from prior CT. There is minimal associated petechial hemorrhage without frank hemorrhagic conversion. There is an additional 8 mm cortical infarct within the parasagittal anterior right frontal lobe (series 5, image 30), likely ACA  distribution. No left cerebral or infratentorial infarcts.  No mass lesion or midline shift. No hydrocephalus. No extra-axial fluid collection.  Craniocervical junction within normal limits. Pituitary gland normal.  No acute abnormality about the orbits. Paranasal sinuses are clear. No mastoid effusion. Inner ear structures are normal.  Cerebral atrophy with mild chronic small vessel ischemic changes present in the periventricular white matter. Small vessel type changes present within the pons.  MRA HEAD FINDINGS  ANTERIOR CIRCULATION:  Visualized distal cervical segments of the internal carotid arteries are patent with antegrade flow. There is mildly attenuated flow within the right ICA as compared to the left, likely related to the critical stenosis proximally within the neck. This involves the right MCA and its distal branches as well. The petrous and cavernous segments of the internal carotid arteries are widely patent. Mild narrowing of the supra clinoid right ICA. This extends into the proximal right M1 and A1 segments. Left A1 segment is dominant. Anterior communicating artery and anterior cerebral arteries patent.  Left  M1 segment is widely patent. Left MCA bifurcation and distal MCA branches well opacified.  There is somewhat attenuated and irregular flow within the right M1 segment without focal high-grade stenosis or frank occlusion. Occlusion of the inferior division of the proximal right M2 branch noted, better evaluated on prior CTA. Flow within the distal right MCA branches is attenuated.  POSTERIOR CIRCULATION:  Vertebral arteries are patent to the vertebrobasilar junction. Partially visualized posterior inferior cerebral arteries patent. Basilar artery widely patent. Superior cerebellar arteries patent proximally. Posterior cerebral arteries opacified to their distal aspects. There is a moderate irregular stenosis within the proximal right P2 segment.  No aneurysm.  IMPRESSION: MRI HEAD IMPRESSION:   1. Multi focal patchy acute right MCA territory infarcts. Overall distribution is stable from prior CT. There is very minimal associated petechial hemorrhage without evidence for hemorrhagic conversion. 2. Additional 8 mm cortical infarct within the parasagittal cortical gray matter of the anterior right frontal lobe, likely right ACA in distribution. 3. Mild cerebral atrophy with chronic microvascular ischemic disease.  MRA HEAD IMPRESSION:  1. Attenuated flow throughout the visualized right ICA and MCA arteries, likely related to the critical stenosis seen proximally within the neck on prior CTA. Flow within the right M1 segment is thready and irregular, with attenuated distal right MCA branch flow. 2. Right MCA M2 inferior division occlusion, stable from prior CTA. 3. Irregular moderate right P2 stenosis.   Electronically Signed   By: Jeannine Boga M.D.   On: 07/25/2014 02:07   Ct Angio Chest Aorta W/cm &/or Wo/cm 07/24/2014   CLINICAL DATA:  Stroke.  EXAM: CT ANGIOGRAPHY CHEST WITH CONTRAST  TECHNIQUE: Multidetector CT imaging of the chest was performed using the standard protocol during bolus administration of intravenous contrast. Multiplanar CT image reconstructions and MIPs were obtained to evaluate the vascular anatomy.  CONTRAST:  127mL OMNIPAQUE IOHEXOL 350 MG/ML SOLN  COMPARISON:  None.  FINDINGS: THORACIC INLET/BODY WALL:  No acute abnormality.  MEDIASTINUM:  Mild cardiomegaly. No cardiac findings to explain stroke. No pericardial effusion. There is extensive atherosclerosis of the aorta with irregular plaque mainly involving the distal arch and descending aorta. There is no ascending aorta ulcerated plaque or dissection to explain the right carotid circulation infarct. The great vessels are widely patent. The pulmonary arteries are not evaluated due to contrast timing.  LUNG WINDOWS:  Lucencies in the upper lungs have some central vessels, but no definite perceptible wall. These are likely  emphysematous spaces. There is a 4 mm noncalcified pulmonary nodule along the lower left major fissure on image 58. There is a 6 mm pulmonary nodule in the right middle lobe on image 52. A smaller pulmonary nodule is present in the left upper lobe on image 30.  UPPER ABDOMEN:  45 mm mass in the left adrenal gland with 2 foci of macroscopic fat. There is also calcification. The remainder of the nodule is fairly low-density. There is some lobulation but the margins appear distinct. 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 This type of internal fat would be unusual within a cortical carcinoma.  Cholelithiasis and probable 2 mm gallbladder polyp.  OSSEOUS:  No acute fracture.  No suspicious lytic or blastic lesions.  Review of the MIP images confirms the above findings.  IMPRESSION: 1. No great vessel or ascending aorta findings to explain cerebral infarct. 2. Extensive atherosclerosis. 3. Emphysema with bilateral pulmonary nodules, up to 6 mm on the right. Given risk factors for bronchogenic carcinoma,  follow-up chest CT at 6 - 12 months is recommended. This recommendation follows the consensus statement: Guidelines for Management of Small Pulmonary Nodules Detected on CT Scans: A Statement from the [employer] as published in Radiology 2005;237:395-400. 4. 45 mm left adrenal mass with internal fat, most consistent with myelolipoma. 5. Cholelithiasis and probable 3 mm gallbladder polyp.   Electronically Signed   By: Monte Fantasia M.D.   On: 07/24/2014 18:45     Current Medications:  . antiseptic oral rinse  7 mL Mouth Rinse BID  . aspirin  325 mg Oral Daily   Or  . aspirin  300 mg Rectal Daily  . heparin subcutaneous  5,000 Units Subcutaneous 3 times per day  . insulin aspart  0-5 Units Subcutaneous QHS  . insulin aspart  0-9 Units Subcutaneous TID WC  . levothyroxine  88 mcg Oral QAC breakfast      ASSESSMENT AND PLAN: Principal Problem:   CVA (cerebral  infarction) Active Problems:   Hyperlipidemia   Diabetes   Elevated troponin   Tobacco abuse   Left-sided weakness   Signed, Rosaria Ferries , PA-C 11:25 AM 07/25/2014  Patient seen, examined. Available data reviewed. Agree with findings, assessment, and plan as outlined by Rosaria Ferries, PA-C. The patient is independently interviewed and examined. Changes made where appropriate above. I have reviewed her echocardiogram at the bedside. The official interpretation is pending. The patient has vigorous LV systolic function with no regional wall motion abnormalities. The LV cavity obliterates during systole.   I have reviewed all of the patient's data. Her troponin initially was 4.2 and has slowly trended down to 3.0. The patient had an acute stroke and she laid on the ground for a long time. Her total CK is 500. I think it is possible that her troponin is elevated because of rhabdomyolysis. I do not think there is evidence of acute coronary syndrome. Would treat the patient aggressively for her hyperlipidemia and diffuse atherosclerosis that has been identified on imaging studies. I would not recommend stress testing or cardiac catheterization. Antiplatelet therapy with aspirin, a high-intensity statin drug, and an ACE inhibitor in the setting of her diabetes are all indicated. Please call if any further cardiac issues arise.  Sherren Mocha, M.D. 07/25/2014 11:52 AM

## 2014-07-25 NOTE — Progress Notes (Signed)
  Echocardiogram 2D Echocardiogram has been performed.  Bobbye Charleston 07/25/2014, 11:27 AM

## 2014-07-25 NOTE — Discharge Summary (Signed)
[facility] Discharge Summary  Patient name: Carla Hopkins Medical record number: [id number] Date of birth: 19-Aug-1950 Age: 64 y.o. Gender: female Date of Admission: 07/24/2014  Date of Discharge: 07/30/2014 Admitting Physician: Kinnie Feil, MD  Primary Care Provider: Ellsworth Lennox, MD Consultants: Neurology, Cardiology, Vascular Surgery  Indication for Hospitalization: Right MCA CVA  Discharge Diagnoses/Problem List:  Right MCA CVA, carotid stenosis, Elevated troponin, HLD, T2DM, HTN, Hypothyroidism, Rhabdomyolysis, Pulmonary Nodules, OSA  Disposition: CIR  Discharge Condition: Improved  Discharge Exam: Blood pressure 90/56, pulse 87, temperature 97.4 F (36.3 C), temperature source Oral, resp. rate 20, height 5' 2.5" (1.588 m), weight 210 lb 5.1 oz (95.4 kg), SpO2 94 %. General: 64 year old female lying in hospital bed in no acute distress  HEENT: NCAT, MMM, xanthoma noted on left inferior eye lid,  Cardiovascular: RRR, 2/6 systolic murmur.  Respiratory: NWOB, Clear to auscultation bilaterally  Abdomen: +BS, soft, NT, ND  Extremities: No edema noted  Neuro: Alert and conversational. Dysarthric speech. Left facial droop noted, LUE 1/5 strength. RUE with normal strength. Bilateral lower extremities with 5/5 strength. No sensation in LUE from elbow to hand. Sensation intact otherwise.   Brief Hospital Course:  Carla Hopkins is a 64 year old female with history of HTN and hypothyroidism who presented to the ED via EMS with acute left arm paralysis after falling and being down in her home for 4-5 hours before calling EMS. Her hospital course by problem is outlined below:  Right MCA CVA. In the ED, patient was noted to have complete flaccid paralysis of her left arm in addition to left sided facial droop. CT was performed which revealed acute right MCA territory infarcts. Patient was out of the time window for receiving TPA. Neurology was consulted  and the patient was admitted to the family medicine team. MRI confirmed acute right MCA infarct with minimal petechial hemorrhages. Echocardiogram revealed preserved EF with a thickened aortic valve and grade 1 diastolic dysfunction. Given her extensive vascular disease (see problem below) patient was started on dual antiplatelet therapy with aspirin 47m daily and plavix 71mdaily. Patient met with our OT and PT who recommended CIR placement.   Elevated Troponin. Patient's troponin in the ED was noted to be elevated to 1.82. Cardiology was consulted in the ED. Per cardiology, this was not likely due to ACS, but possibly due to her mild rhabdomyolysis or autonomic dysfunction due to her CVA. Her troponin peaked at 4.17 then trended down. Patient did not have any symptoms of ACS (no chest pain or shortness of breath).  Carotid Artery Stenosis. Patient was noted to have right ICA stenosis on her CTA and MRA. Carotid dopplers revealed greater than 80% ICA stenosis on the right and 1-39% ICA stenosis on the left. We consulted our vascular surgery team. Patient is planned to have a carotid endarterectomy 2 weeks after discharge.  HLD. Lipid panel here revealed total cholesterol 317, TG 154, LDL 240. Patient was not on any lipid lowering agents prior to admission. We started lipitor 8055maily here.   T2DM. A1c here was 8.5%. Patient was not on any anti-glycemic medications prior to admission. We managed the patient with SSI and we started her on metformin.   HTN. Patient was normotensive here. We restarted lisinopril 5mg65mile here.   Pulmonary Nodules. Bilateral pulmonary nodules were found on CT incidentally. Given 50-pack year history of smoking, there was concern for bronchigenic carcinoma. Radiology recommended follow up in 6-12 months.  Mild  Rhabdomyolysis. CK on admission mildly elevated to 510, likely due to be down on floor several hours prior to arrival. This improved with IVF.  The patient's  other chronic medical conditions were stable during this admission and no changes were made to her medication regimens at the time of discharge.   Issues for Follow Up:  1. Patient discharged to CIR. She will need aggressive risk factor modification including smoking cessation and tighter glycemic control. 2. We started metformin 533m bid, consider increasing as outpatient 3. Follow up BP - Increase lisinopril, or restart HCTZ or metoprolol as pressure allows.  4. Patient will have CEA performed in 2 weeks by vascular surgery 5. Patient can stay on Aspirin and Plavix per vascular 6. Recommended follow up CT in 6-12 months to follow up bilateral pulmonary nodules.   Significant Procedures: None  Significant Labs and Imaging:   Recent Labs Lab 07/24/14 1452 07/24/14 1518 07/25/14 0250 07/26/14 0332  WBC 18.9*  --  15.5* 12.2*  HGB 16.1* 18.4* 16.0* 14.6  HCT 47.4* 54.0* 47.8* 44.7  PLT 319  --  331 304    Recent Labs Lab 07/24/14 1452 07/24/14 1518  NA 138 139  K 3.7 3.7  CL 103 103  CO2 24  --   GLUCOSE 196* 197*  BUN 18 22  CREATININE 0.88 0.80  CALCIUM 9.4  --   ALKPHOS 89  --   AST 42*  --   ALT 26  --   ALBUMIN 3.6  --    Total cholesterol 317, TG 154, LDL 240 A1c 8.5% Troponin 1.82 > 4.17 >3.71 CK 510  TSH 2.151  Utox: negative Ethanol: <5  EKG: NSR, TWI in V1 through V5  Echo: - Left ventricle: The cavity size was mildly reduced. Wall thickness was increased in a pattern of mild LVH. Systolic function was vigorous. The estimated ejection fraction was in the range of 65% to 70%. Doppler parameters are consistent with abnormal left ventricular relaxation (grade 1 diastolic dysfunction). - Aortic valve: AV is thickened, calcified Difficult to see well. Peak and mean gradients through the LV/LVOT and AV are 34 and 18 mm Hg. By 2D imaging the valve, though not optimally seen, does not appear to be stenotic. Some of gradient may be due  to dynamic obstruction from below valve . - Mitral valve: Calcified annulus. Mildly thickened leaflets . - Left atrium: The atrium was moderately dilated.  Carotid Dopplers: Summary:  - The vertebral arteries appear patent with antegrade flow. - Findings consistent with greater than 80 percent stenosis involving the right internal carotid artery. - Findings consistent with 1-39 percent stenosis involving the left internal carotid artery.  Ct Angio Head W/cm &/or Wo Cm  07/24/2014   CLINICAL DATA:  Stroke. Acute onset left-sided weakness. Facial droop and slurred speech. Last known normal last night.  EXAM: CT ANGIOGRAPHY HEAD AND NECK  TECHNIQUE: Multidetector CT imaging of the head and neck was performed using the standard protocol during bolus administration of intravenous contrast. Multiplanar CT image reconstructions and MIPs were obtained to evaluate the vascular anatomy. Carotid stenosis measurements (when applicable) are obtained utilizing NASCET criteria, using the distal internal carotid diameter as the denominator.  CONTRAST:  1446mOMNIPAQUE IOHEXOL 350 MG/ML SOLN  There is contrast extravasation during the first injection, which was approximately 70 mL of Omnipaque 350. Patient received a new IV and underwent a second injection without incident. Patient was evaluated by Dr. WaPascal Luxf radiology.  COMPARISON:  None.  FINDINGS: CT HEAD  Brain: There are multiple regions of cytotoxic edema in the right MCA territory consistent with areas of acute to subacute infarction. These involve the posterior right insula, operculum, frontal lobe, and parietal lobe. No acute intracranial hemorrhage, mass, midline shift, or extra-axial fluid collection is seen. Ventricles and sulci are normal for age.  Calvarium and skull base: No aggressive osseous lesions.  Paranasal sinuses: Small left maxillary sinus mucous retention cyst. Clear mastoid air cells.  Orbits: Unremarkable.  CTA NECK  Aortic arch: 3  vessel aortic arch with moderate, predominantly noncalcified plaque. Brachiocephalic and right subclavian artery are patent without stenosis. There is mild-to-moderate plaque at the left subclavian artery origin without stenosis.  Right carotid system: Common carotid artery is patent without proximal stenosis. There is a large amount of mixed calcified and noncalcified plaque at the carotid bifurcation which results in severe stenosis of the internal carotid artery origin (radiographic string sign), with the residual patent lumen measuring no more than 1 mm in diameter over a distance of approximately 3 mm. Remainder of the cervical ICA is patent although slightly smaller in caliber compared to the contralateral side.  Left carotid system: Common carotid artery is patent without stenosis and is tortuous proximally. There is mild-to-moderate involving the carotid bifurcation proximal ICA without stenosis. Remainder of the cervical ICA is unremarkable.  Vertebral arteries:Vertebral arteries are patent with the left being slightly larger than the right and without vertebral artery stenosis identified.  Skeleton: Moderate multilevel disc degeneration in the mid and lower cervical spine.  Other neck: Centrilobular emphysema in the lung apices. No neck mass.  CTA HEAD  Anterior circulation: Internal carotid arteries are patent from skullbase to carotid termini. There is mild right and moderate left carotid siphon atherosclerotic plaque without significant stenosis. There is mild narrowing at the right carotid terminus with involvement of the ACA and MCA origins. There is irregular, mild narrowing of the right M1 segment which is small in caliber diffusely. Right MCA bifurcation is patent, however there is occlusion of the M2 inferior division proximally. Left M1 segment and major left MCA branch vessels are patent without evidence of significant stenosis. Left A1 segment is mildly dominant. Anterior communicating artery  is patent. There is mild bilateral A2 irregularity. No intracranial aneurysm is identified.  Posterior circulation: Intracranial vertebral arteries are patent to the basilar with mild irregularity bilaterally but no significant stenosis. Left PICA origin is patent. Right PICA may share a common origin with the right AICA. AICA origins are patent bilaterally with the right being dominant. SCA origins are patent. Basilar artery is patent without stenosis. There is a patent left posterior communicating artery. There may be a tiny right posterior communicating artery. P1 segments are patent without stenosis. There is moderate proximal right P2 stenosis. Mild PCA branch vessel irregularity is present bilaterally.  Venous sinuses: Patent.  Anatomic variants: None.  Delayed phase: Minimal cortical enhancement associated with some of the areas infarction, most notably in the right parietal lobe.  IMPRESSION: 1. Acute right MCA territory infarcts. No intracranial hemorrhage identified. 2. Critical proximal right ICA stenosis. 3. Right MCA M2 inferior division occlusion. 4. Moderate right P2 PCA stenosis.  These results were called by telephone at the time of interpretation on 07/24/2014 at 7:06 pm to Dr. Serita Grit , who verbally acknowledged these results.   Electronically Signed   By: Logan Bores   On: 07/24/2014 19:10   Ct Angio Neck W/cm &/or Wo/cm  07/24/2014  CLINICAL DATA:  Stroke. Acute onset left-sided weakness. Facial droop and slurred speech. Last known normal last night.  EXAM: CT ANGIOGRAPHY HEAD AND NECK  TECHNIQUE: Multidetector CT imaging of the head and neck was performed using the standard protocol during bolus administration of intravenous contrast. Multiplanar CT image reconstructions and MIPs were obtained to evaluate the vascular anatomy. Carotid stenosis measurements (when applicable) are obtained utilizing NASCET criteria, using the distal internal carotid diameter as the denominator.  CONTRAST:   167m OMNIPAQUE IOHEXOL 350 MG/ML SOLN  There is contrast extravasation during the first injection, which was approximately 70 mL of Omnipaque 350. Patient received a new IV and underwent a second injection without incident. Patient was evaluated by Dr. WPascal Luxof radiology.  COMPARISON:  None.  FINDINGS: CT HEAD  Brain: There are multiple regions of cytotoxic edema in the right MCA territory consistent with areas of acute to subacute infarction. These involve the posterior right insula, operculum, frontal lobe, and parietal lobe. No acute intracranial hemorrhage, mass, midline shift, or extra-axial fluid collection is seen. Ventricles and sulci are normal for age.  Calvarium and skull base: No aggressive osseous lesions.  Paranasal sinuses: Small left maxillary sinus mucous retention cyst. Clear mastoid air cells.  Orbits: Unremarkable.  CTA NECK  Aortic arch: 3 vessel aortic arch with moderate, predominantly noncalcified plaque. Brachiocephalic and right subclavian artery are patent without stenosis. There is mild-to-moderate plaque at the left subclavian artery origin without stenosis.  Right carotid system: Common carotid artery is patent without proximal stenosis. There is a large amount of mixed calcified and noncalcified plaque at the carotid bifurcation which results in severe stenosis of the internal carotid artery origin (radiographic string sign), with the residual patent lumen measuring no more than 1 mm in diameter over a distance of approximately 3 mm. Remainder of the cervical ICA is patent although slightly smaller in caliber compared to the contralateral side.  Left carotid system: Common carotid artery is patent without stenosis and is tortuous proximally. There is mild-to-moderate involving the carotid bifurcation proximal ICA without stenosis. Remainder of the cervical ICA is unremarkable.  Vertebral arteries:Vertebral arteries are patent with the left being slightly larger than the right and  without vertebral artery stenosis identified.  Skeleton: Moderate multilevel disc degeneration in the mid and lower cervical spine.  Other neck: Centrilobular emphysema in the lung apices. No neck mass.  CTA HEAD  Anterior circulation: Internal carotid arteries are patent from skullbase to carotid termini. There is mild right and moderate left carotid siphon atherosclerotic plaque without significant stenosis. There is mild narrowing at the right carotid terminus with involvement of the ACA and MCA origins. There is irregular, mild narrowing of the right M1 segment which is small in caliber diffusely. Right MCA bifurcation is patent, however there is occlusion of the M2 inferior division proximally. Left M1 segment and major left MCA branch vessels are patent without evidence of significant stenosis. Left A1 segment is mildly dominant. Anterior communicating artery is patent. There is mild bilateral A2 irregularity. No intracranial aneurysm is identified.  Posterior circulation: Intracranial vertebral arteries are patent to the basilar with mild irregularity bilaterally but no significant stenosis. Left PICA origin is patent. Right PICA may share a common origin with the right AICA. AICA origins are patent bilaterally with the right being dominant. SCA origins are patent. Basilar artery is patent without stenosis. There is a patent left posterior communicating artery. There may be a tiny right posterior communicating artery. P1 segments are patent  without stenosis. There is moderate proximal right P2 stenosis. Mild PCA branch vessel irregularity is present bilaterally.  Venous sinuses: Patent.  Anatomic variants: None.  Delayed phase: Minimal cortical enhancement associated with some of the areas infarction, most notably in the right parietal lobe.  IMPRESSION: 1. Acute right MCA territory infarcts. No intracranial hemorrhage identified. 2. Critical proximal right ICA stenosis. 3. Right MCA M2 inferior division  occlusion. 4. Moderate right P2 PCA stenosis.  These results were called by telephone at the time of interpretation on 07/24/2014 at 7:06 pm to Dr. Serita Grit , who verbally acknowledged these results.   Electronically Signed   By: Logan Bores   On: 07/24/2014 19:10   Mr Brain Wo Contrast  07/25/2014   CLINICAL DATA:  Initial evaluation for acute left-sided weakness.  EXAM: MRI HEAD WITHOUT CONTRAST  MRA HEAD WITHOUT CONTRAST  TECHNIQUE: Multiplanar, multiecho pulse sequences of the brain and surrounding structures were obtained without intravenous contrast. Angiographic images of the head were obtained using MRA technique without contrast.  COMPARISON:  Prior CT and CTA from 07/24/2014.  FINDINGS: MRI HEAD FINDINGS  Patchy multi focal areas of restricted diffusion again seen throughout the right frontal and parietal lobes, compatible with acute right MCA territory infarct. There is involvement of the right insular cortex. Infarct involves the cortical gray matter as well as the deep and subcortical white matter. There is a 13 mm infarct within the right thalamus. There is localized cytotoxic edema without significant mass effect. Overall, distribution is stable from prior CT. There is minimal associated petechial hemorrhage without frank hemorrhagic conversion. There is an additional 8 mm cortical infarct within the parasagittal anterior right frontal lobe (series 5, image 30), likely ACA distribution. No left cerebral or infratentorial infarcts.  No mass lesion or midline shift. No hydrocephalus. No extra-axial fluid collection.  Craniocervical junction within normal limits. Pituitary gland normal.  No acute abnormality about the orbits. Paranasal sinuses are clear. No mastoid effusion. Inner ear structures are normal.  Cerebral atrophy with mild chronic small vessel ischemic changes present in the periventricular white matter. Small vessel type changes present within the pons.  MRA HEAD FINDINGS  ANTERIOR  CIRCULATION:  Visualized distal cervical segments of the internal carotid arteries are patent with antegrade flow. There is mildly attenuated flow within the right ICA as compared to the left, likely related to the critical stenosis proximally within the neck. This involves the right MCA and its distal branches as well. The petrous and cavernous segments of the internal carotid arteries are widely patent. Mild narrowing of the supra clinoid right ICA. This extends into the proximal right M1 and A1 segments. Left A1 segment is dominant. Anterior communicating artery and anterior cerebral arteries patent.  Left M1 segment is widely patent. Left MCA bifurcation and distal MCA branches well opacified.  There is somewhat attenuated and irregular flow within the right M1 segment without focal high-grade stenosis or frank occlusion. Occlusion of the inferior division of the proximal right M2 branch noted, better evaluated on prior CTA. Flow within the distal right MCA branches is attenuated.  POSTERIOR CIRCULATION:  Vertebral arteries are patent to the vertebrobasilar junction. Partially visualized posterior inferior cerebral arteries patent. Basilar artery widely patent. Superior cerebellar arteries patent proximally. Posterior cerebral arteries opacified to their distal aspects. There is a moderate irregular stenosis within the proximal right P2 segment.  No aneurysm.  IMPRESSION: MRI HEAD IMPRESSION:  1. Multi focal patchy acute right MCA territory infarcts. Overall distribution  is stable from prior CT. There is very minimal associated petechial hemorrhage without evidence for hemorrhagic conversion. 2. Additional 8 mm cortical infarct within the parasagittal cortical gray matter of the anterior right frontal lobe, likely right ACA in distribution. 3. Mild cerebral atrophy with chronic microvascular ischemic disease.  MRA HEAD IMPRESSION:  1. Attenuated flow throughout the visualized right ICA and MCA arteries, likely  related to the critical stenosis seen proximally within the neck on prior CTA. Flow within the right M1 segment is thready and irregular, with attenuated distal right MCA branch flow. 2. Right MCA M2 inferior division occlusion, stable from prior CTA. 3. Irregular moderate right P2 stenosis.   Electronically Signed   By: Jeannine Boga M.D.   On: 07/25/2014 02:07   Mr Jodene Nam Head/brain Wo Cm  07/25/2014   CLINICAL DATA:  Initial evaluation for acute left-sided weakness.  EXAM: MRI HEAD WITHOUT CONTRAST  MRA HEAD WITHOUT CONTRAST  TECHNIQUE: Multiplanar, multiecho pulse sequences of the brain and surrounding structures were obtained without intravenous contrast. Angiographic images of the head were obtained using MRA technique without contrast.  COMPARISON:  Prior CT and CTA from 07/24/2014.  FINDINGS: MRI HEAD FINDINGS  Patchy multi focal areas of restricted diffusion again seen throughout the right frontal and parietal lobes, compatible with acute right MCA territory infarct. There is involvement of the right insular cortex. Infarct involves the cortical gray matter as well as the deep and subcortical white matter. There is a 13 mm infarct within the right thalamus. There is localized cytotoxic edema without significant mass effect. Overall, distribution is stable from prior CT. There is minimal associated petechial hemorrhage without frank hemorrhagic conversion. There is an additional 8 mm cortical infarct within the parasagittal anterior right frontal lobe (series 5, image 30), likely ACA distribution. No left cerebral or infratentorial infarcts.  No mass lesion or midline shift. No hydrocephalus. No extra-axial fluid collection.  Craniocervical junction within normal limits. Pituitary gland normal.  No acute abnormality about the orbits. Paranasal sinuses are clear. No mastoid effusion. Inner ear structures are normal.  Cerebral atrophy with mild chronic small vessel ischemic changes present in the  periventricular white matter. Small vessel type changes present within the pons.  MRA HEAD FINDINGS  ANTERIOR CIRCULATION:  Visualized distal cervical segments of the internal carotid arteries are patent with antegrade flow. There is mildly attenuated flow within the right ICA as compared to the left, likely related to the critical stenosis proximally within the neck. This involves the right MCA and its distal branches as well. The petrous and cavernous segments of the internal carotid arteries are widely patent. Mild narrowing of the supra clinoid right ICA. This extends into the proximal right M1 and A1 segments. Left A1 segment is dominant. Anterior communicating artery and anterior cerebral arteries patent.  Left M1 segment is widely patent. Left MCA bifurcation and distal MCA branches well opacified.  There is somewhat attenuated and irregular flow within the right M1 segment without focal high-grade stenosis or frank occlusion. Occlusion of the inferior division of the proximal right M2 branch noted, better evaluated on prior CTA. Flow within the distal right MCA branches is attenuated.  POSTERIOR CIRCULATION:  Vertebral arteries are patent to the vertebrobasilar junction. Partially visualized posterior inferior cerebral arteries patent. Basilar artery widely patent. Superior cerebellar arteries patent proximally. Posterior cerebral arteries opacified to their distal aspects. There is a moderate irregular stenosis within the proximal right P2 segment.  No aneurysm.  IMPRESSION: MRI HEAD IMPRESSION:  1. Multi focal patchy acute right MCA territory infarcts. Overall distribution is stable from prior CT. There is very minimal associated petechial hemorrhage without evidence for hemorrhagic conversion. 2. Additional 8 mm cortical infarct within the parasagittal cortical gray matter of the anterior right frontal lobe, likely right ACA in distribution. 3. Mild cerebral atrophy with chronic microvascular ischemic  disease.  MRA HEAD IMPRESSION:  1. Attenuated flow throughout the visualized right ICA and MCA arteries, likely related to the critical stenosis seen proximally within the neck on prior CTA. Flow within the right M1 segment is thready and irregular, with attenuated distal right MCA branch flow. 2. Right MCA M2 inferior division occlusion, stable from prior CTA. 3. Irregular moderate right P2 stenosis.   Electronically Signed   By: Jeannine Boga M.D.   On: 07/25/2014 02:07   Ct Angio Chest Aorta W/cm &/or Wo/cm  07/24/2014   CLINICAL DATA:  Stroke.  EXAM: CT ANGIOGRAPHY CHEST WITH CONTRAST  TECHNIQUE: Multidetector CT imaging of the chest was performed using the standard protocol during bolus administration of intravenous contrast. Multiplanar CT image reconstructions and MIPs were obtained to evaluate the vascular anatomy.  CONTRAST:  110m OMNIPAQUE IOHEXOL 350 MG/ML SOLN  COMPARISON:  None.  FINDINGS: THORACIC INLET/BODY WALL:  No acute abnormality.  MEDIASTINUM:  Mild cardiomegaly. No cardiac findings to explain stroke. No pericardial effusion. There is extensive atherosclerosis of the aorta with irregular plaque mainly involving the distal arch and descending aorta. There is no ascending aorta ulcerated plaque or dissection to explain the right carotid circulation infarct. The great vessels are widely patent. The pulmonary arteries are not evaluated due to contrast timing.  LUNG WINDOWS:  Lucencies in the upper lungs have some central vessels, but no definite perceptible wall. These are likely emphysematous spaces. There is a 4 mm noncalcified pulmonary nodule along the lower left major fissure on image 58. There is a 6 mm pulmonary nodule in the right middle lobe on image 52. A smaller pulmonary nodule is present in the left upper lobe on image 30.  UPPER ABDOMEN:  45 mm mass in the left adrenal gland with 2 foci of macroscopic fat. There is also calcification. The remainder of the nodule is fairly  low-density. There is some lobulation but the margins appear distinct. 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 This type of internal fat would be unusual within a cortical carcinoma.  Cholelithiasis and probable 2 mm gallbladder polyp.  OSSEOUS:  No acute fracture.  No suspicious lytic or blastic lesions.  Review of the MIP images confirms the above findings.  IMPRESSION: 1. No great vessel or ascending aorta findings to explain cerebral infarct. 2. Extensive atherosclerosis. 3. Emphysema with bilateral pulmonary nodules, up to 6 mm on the right. Given risk factors for bronchogenic carcinoma, follow-up chest CT at 6 - 12 months is recommended. This recommendation follows the consensus statement: Guidelines for Management of Small Pulmonary Nodules Detected on CT Scans: A Statement from the [employer] published in Radiology 2005;237:395-400. 4. 45 mm left adrenal mass with internal fat, most consistent with myelolipoma. 5. Cholelithiasis and probable 3 mm gallbladder polyp.   Electronically Signed   By: JMonte FantasiaM.D.   On: 07/24/2014 18:45    Results/Tests Pending at Time of Discharge: None  Discharge Medications:    Medication List    STOP taking these medications        lisinopril-hydrochlorothiazide 20-12.5 MG per tablet  Commonly known as:  ZESTORETIC     metoprolol succinate 25 MG 24 hr tablet  Commonly known as:  TOPROL-XL      TAKE these medications        albuterol 108 (90 BASE) MCG/ACT inhaler  Commonly known as:  PROVENTIL HFA;VENTOLIN HFA  Inhale 2 puffs into the lungs every 4 (four) hours as needed for wheezing or shortness of breath (cough, shortness of breath or wheezing.).     aspirin 81 MG chewable tablet  Chew 1 tablet (81 mg total) by mouth daily.     atorvastatin 80 MG tablet  Commonly known as:  LIPITOR  Take 1 tablet (80 mg total) by mouth daily at 6 PM.     clopidogrel 75 MG tablet  Commonly known as:  PLAVIX  Take  1 tablet (75 mg total) by mouth daily.     ergocalciferol 50000 UNITS capsule  Commonly known as:  DRISDOL  Take 1 capsule (50,000 Units total) by mouth once a week.     ibuprofen 200 MG tablet  Commonly known as:  ADVIL,MOTRIN  Take 400 mg by mouth every 6 (six) hours as needed.     levothyroxine 88 MCG tablet  Commonly known as:  SYNTHROID, LEVOTHROID  Take 1 tablet (88 mcg total) by mouth daily.     lisinopril 5 MG tablet  Commonly known as:  PRINIVIL,ZESTRIL  Take 1 tablet (5 mg total) by mouth daily.     metFORMIN 500 MG tablet  Commonly known as:  GLUCOPHAGE  Take 1 tablet (500 mg total) by mouth 2 (two) times daily with a meal.        Discharge Instructions: Please refer to Patient Instructions section of EMR for full details.  Patient was counseled important signs and symptoms that should prompt return to medical care, changes in medications, dietary instructions, activity restrictions, and follow up appointments.   Follow-Up Appointments: Follow-up Information    Follow up with Xu,Jindong, MD. Schedule an appointment as soon as possible for a visit in 2 months.   Specialty:  Neurology   Why:  stroke clinic   Contact information:   [address] [place] [address] [phone]       Schedule an appointment as soon as possible for a visit with Ellsworth Lennox, MD.   Specialty:  Family Medicine   Why:  Schedule After Discharged   Contact information:   [place] [address] [phone]       Vivi Barrack, MD 07/30/2014, 12:23 PM PGY-1, [place]

## 2014-07-25 NOTE — Evaluation (Signed)
Clinical/Bedside Swallow Evaluation Patient Details  Name: Carla Hopkins MRN: [id number] Date of Birth: 1950-07-07  Today's Date: 07/25/2014 Time: SLP Start Time (ACUTE ONLY): 3612 SLP Stop Time (ACUTE ONLY): 1108 SLP Time Calculation (min) (ACUTE ONLY): 70 min  Past Medical History:  Past Medical History  Diagnosis Date  . Hypothyroidism   . Hypertension    Past Surgical History:  Past Surgical History  Procedure Laterality Date  . Tubal ligation     HPI:  64 year old female adm with left sided weakness and dysarthria.  Brain CT reveals a Right MCA CVA.  Incidental findings of pulmonary nodules (on CT).  PMH:  HTN   Assessment / Plan / Recommendation Clinical Impression  Pt exhibits positive s/s of aspiraiton with thin liquids intermittently, and oral phase dysphagia with solids.  Functionally, pt has difficulty chewing and manipulating the solid bolus, with pocketing and oral residue noted after swallows (unable to clear oral cavity with sips of liquid). Pt is unaware of oral residue due to reduced sensation on the left.  Will initiate a Dysphagia 2 diet with nectar thick liquids, and will follow closely for assessment of diet tolerance and readiness to advance.  Pt will need MBS if this diet is not tolerated.      Aspiration Risk  Moderate    Diet Recommendation Dysphagia 2 (Fine chop);Nectar-thick liquid   Liquid Administration via: Cup;No straw Medication Administration: Whole meds with puree Supervision: Full supervision/cueing for compensatory strategies Compensations: Slow rate;Small sips/bites;Check for pocketing;Check for anterior loss Postural Changes and/or Swallow Maneuvers: Seated upright 90 degrees    Other  Recommendations Oral Care Recommendations: Oral care Q4 per protocol (Oral care after each meal) Other Recommendations: Order thickener from pharmacy;Prohibited food (jello, ice cream, thin soups);Clarify dietary restrictions   Follow Up Recommendations  Inpatient Rehab    Frequency and Duration min 2x/week  2 weeks   Pertinent Vitals/Pain No c/o pain; Respiratory rate increased after swallowing thin liquids (38); and O2 SATS decreased to 88% (from 94).    SLP Swallow Goals     Swallow Study Prior Functional Status       General HPI: 64 year old female adm with left sided weakness and dysarthria.  Brain CT reveals a Right MCA CVA.  Incidental findings of pulmonary nodules (on CT).  PMH:  HTN Type of Study: Bedside swallow evaluation Previous Swallow Assessment: none Diet Prior to this Study: NPO Temperature Spikes Noted: No Respiratory Status: Nasal cannula History of Recent Intubation: No Behavior/Cognition: Alert;Cooperative;Distractible;Requires cueing;Decreased sustained attention Oral Cavity - Dentition: Adequate natural dentition Self-Feeding Abilities: Able to feed self;Needs set up Patient Positioning: Upright in bed Baseline Vocal Quality: Clear Volitional Cough: Strong Volitional Swallow: Able to elicit    Oral/Motor/Sensory Function Overall Oral Motor/Sensory Function: Impaired Labial ROM: Reduced left Labial Symmetry: Abnormal symmetry left Labial Strength: Reduced Labial Sensation: Reduced Lingual ROM: Reduced left Lingual Symmetry: Abnormal symmetry left Lingual Strength: Reduced Lingual Sensation: Reduced Facial ROM: Reduced left Facial Symmetry: Left droop Facial Strength: Reduced Facial Sensation: Reduced Mandible: Within Functional Limits   Ice Chips Ice chips: Within functional limits Presentation: Spoon   Thin Liquid Thin Liquid: Impaired Presentation: Spoon;Cup;Straw Oral Phase Impairments: Reduced lingual movement/coordination;Impaired anterior to posterior transit;Impaired mastication Oral Phase Functional Implications: Left lateral sulci pocketing;Prolonged oral transit;Oral residue;Left anterior spillage Pharyngeal  Phase Impairments: Suspected delayed Swallow;Decreased hyoid-laryngeal  movement;Cough - Immediate    Nectar Thick Nectar Thick Liquid: Within functional limits Presentation: Cup;Spoon   Honey Thick Honey Thick Liquid:  Not tested   Puree Puree: Within functional limits Presentation: Spoon   Solid   GO    Solid: Impaired Presentation: Self Fed Oral Phase Impairments: Reduced labial seal;Reduced lingual movement/coordination;Impaired anterior to posterior transit;Impaired mastication Oral Phase Functional Implications: Left anterior spillage;Oral residue       Quinn Axe T 07/25/2014,11:10 AM

## 2014-07-25 NOTE — Progress Notes (Signed)
Utilization review completed. Jadesola Poynter, RN, BSN. 

## 2014-07-25 NOTE — Progress Notes (Signed)
CRITICAL VALUE ALERT  Critical value received:  ckmb 16.7  Date of notification:  07/25/2014  Time of notification:  1238  Critical value read back:Yes.    Nurse who received alert:  Pecolia Ades  MD notified (1st page):  Dr Bonner Puna  Time of first page:  49  MD notified (2nd page):[phone]  Time of second page:1250  Responding MD: dr Lacinda Axon??   Time MD responded:  1300

## 2014-07-25 NOTE — Progress Notes (Signed)
Family Medicine Teaching Service Daily Progress Note Intern Pager: [phone]  Patient name: Carla Hopkins Medical record number: [id number] Date of birth: 12-14-1950 Age: 64 y.o. Gender: female  Primary Care Provider: Ellsworth Lennox, MD Consultants: Neurology, Cardiology Code Status: Full  Pt Overview and Major Events to Date:  4/29 - Admitted with right MCA CVA  Assessment and Plan: Carla Hopkins is a 64 y.o. female presenting with acute left sided weakness found to have a right MCA CVA on CT. PMH is significant for HTN, Hypothyroidism, and T2DM. Also with elevated troponin, and incidentally found pulmonary nodules here.   Right MCA CVA. Deficits include flaccid paralysis of LUE, left facial droop, and dysarthric speech. CT and MRI consistent with acute right MCA infarcts and minimal petechial hemorrhage. Not a candidate for tpa. Right ICA and MCA stenosis visualized on MRA and CTA. Risk factors include HTN, approximately 50 pack-year smoking history, T2DM, and HLD. - Neurology consulted, appreciate assistance - carotid dopplers and echocardiogram pending.  - A1c, TSH pending - PT/OT consults - SLP consult - Start ASA daily - Will need aggressive risk factor modification  Elevated Troponin. Troponin  1.82 > 4.17 > 3.71. Denies any chest pain, SOB, or palpitations. Possibly related to CVA, though will need to monitor closely for signs of ACS. Initial options per cardiology were heparin drip (contraindicated per neuro due to risk for hemorrhagic conversion) or plavix load. Unable to give plavix load due to NPO status and refusal to have NG tube placed.  - Cardiology consulted, appreciate assistance.  - Per cardiology, not likely ACS though will follow up echo results - No plavix load at this time given downward trend of troponins - Trend troponins   HLD. Lipid panel here: Total cholesterol 317, TG 154, LDL 240. Not on any medications. 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 Also with HLD  on lipid panel in 2015.  - Will need to start high intensity statin therapy when taking PO and when CK improves.   T2DM. A1c 7.9 on 07/03/2014. Not on any medications.  - Will need stricter management.  - F/u A1c - SSI  Mild Rhabdomyolysis. CK 510 on admission. Likely due to being down on floor for several hours - Continue to monitor  Leukocytosis. WBC 18.9 on admission. Likely acute stress reaction. Improving. - Continue to monitor.   Pulmonary Nodules. Bilateral pulmonary nodules found on CT incidentally. Concern for bronchiogenic carcinoma.  - Follow up in 6-12 months per radiology  HTN. At goal. Will allow up to 220/120 for first 24 hours.  - Hold home lisinopril-HCTZ and metoprolol.   Hypothyroidism.  - Continue home synthroid once taking PO  Rash. Blanchable papular rash on anterior, medial, and lateral aspects of thighs. Possibly irritation/abrasion from crawling on floor. - Continue to monitor  OSA. - CPAP at night  FEN/GI: NPO until passes swallow eval Prophylaxis: SubQ heparin OK per neuro  Disposition: Admitted to stepdown pending above management. Will likely need SNF placement pending PT/OT recommendations.   Subjective:  Doing well this morning. No chest pain or shortness of breath. No change in neuro deficits. Wants something to drink.   Objective: Temp:  [98 F (36.7 C)-98.7 F (37.1 C)] 98.7 F (37.1 C) (04/29 0739) Pulse Rate:  [77-95] 95 (04/29 0739) Resp:  [16-24] 21 (04/29 0739) BP: (103-165)/(59-98) 104/67 mmHg (04/29 0739) SpO2:  [91 %-97 %] 95 % (04/29 0739) Weight:  [210 lb 5.1 oz (95.4 kg)] 210 lb 5.1 oz (95.4 kg) (04/29 0140) Physical Exam: General: 63  year old female lying in hospital bed in no acute distress HEENT: NCAT, MMM, xanthoma noted on left inferior eye lid,  Cardiovascular: RRR, 2/6 systolic murmur.  Respiratory: NWOB, nasal cannula in place, faint wheezes at bases bilaterally Abdomen: +BS, soft, NT, ND Extremities: No  edema, WWP Skin: Blanchable papular rash noted on anterior, medial, and lateral aspect of thighs bilaterally.  Neuro: Alert and conversational. Dysarthric speech. Seft facial droop noted LUE 0/5 strength. RUE with normal strength. Bilateral lower extremities with 5/5 strength. No sensation in LUE. Sensation intact otherwise. Gait deferred.  Laboratory/Imaging:  Recent Labs Lab 07/24/14 1452 07/24/14 1518 07/25/14 0250  WBC 18.9*  --  15.5*  HGB 16.1* 18.4* 16.0*  HCT 47.4* 54.0* 47.8*  PLT 319  --  331    Recent Labs Lab 07/24/14 1452 07/24/14 1518  NA 138 139  K 3.7 3.7  CL 103 103  CO2 24  --   BUN 18 22  CREATININE 0.88 0.80  CALCIUM 9.4  --   PROT 7.5  --   BILITOT 0.6  --   ALKPHOS 89  --   ALT 26  --   AST 42*  --   GLUCOSE 196* 197*     Recent Labs Lab 07/24/14 2151 07/25/14 0250  TROPONINI 4.17* 3.71*   Vivi Barrack, MD 07/25/2014, 8:46 AM PGY-1, [place] Intern pager: [phone], text pages welcome

## 2014-07-25 NOTE — ED Notes (Signed)
Pt refused NG tube

## 2014-07-25 NOTE — Progress Notes (Addendum)
STROKE TEAM PROGRESS NOTE   HISTORY Carla Hopkins is an 64 y.o. female with a history of hypertension and hypothyroidism resenting with new onset weakness involving left side. Patient was last known well at bedtime last night at 10:30 PM. She fell when she attempted to get out of bed this morning and was unable to get up from the floor. She has not been on antiplatelet therapy. Is no previous history of stroke nor TIA. Initial laboratory studies showed an elevation in troponin of 1.8. CT scan of the head as well as CTA of head and neck and chest, is pending. NIH stroke score was 10.  LSN: 10:30 PM on 07/23/2014 tPA Given: No: Beyond time under for treatment consideration mRankin:  SUBJECTIVE (INTERVAL HISTORY) Her daughter is at the bedside.  Overall she feels her condition is stable. Her CTA head and neck showed right ICA high grade stenosis and right M2 occlusion. Recommend vascular surgery consultation.   OBJECTIVE Temp:  [98 F (36.7 C)-98.7 F (37.1 C)] 98.7 F (37.1 C) (04/29 0739) Pulse Rate:  [77-95] 95 (04/29 0739) Cardiac Rhythm:  [-] Normal sinus rhythm (04/29 0739) Resp:  [16-24] 21 (04/29 0739) BP: (103-165)/(59-98) 104/67 mmHg (04/29 0739) SpO2:  [91 %-97 %] 95 % (04/29 0739) Weight:  [95.4 kg (210 lb 5.1 oz)] 95.4 kg (210 lb 5.1 oz) (04/29 0140)  No results for input(s): GLUCAP in the last 168 hours.  Recent Labs Lab 07/24/14 1452 07/24/14 1518  NA 138 139  K 3.7 3.7  CL 103 103  CO2 24  --   GLUCOSE 196* 197*  BUN 18 22  CREATININE 0.88 0.80  CALCIUM 9.4  --     Recent Labs Lab 07/24/14 1452  AST 42*  ALT 26  ALKPHOS 89  BILITOT 0.6  PROT 7.5  ALBUMIN 3.6    Recent Labs Lab 07/24/14 1452 07/24/14 1518 07/25/14 0250  WBC 18.9*  --  15.5*  NEUTROABS 16.1*  --   --   HGB 16.1* 18.4* 16.0*  HCT 47.4* 54.0* 47.8*  MCV 85.6  --  86.0  PLT 319  --  331    Recent Labs Lab 07/24/14 1452 07/24/14 2151 07/25/14 0250  CKTOTAL 510*  --   --    TROPONINI  --  4.17* 3.71*    Recent Labs  07/24/14 1452  LABPROT 13.0  INR 0.97    Recent Labs  07/24/14 1616  COLORURINE YELLOW  LABSPEC 1.023  PHURINE 5.5  GLUCOSEU 100*  HGBUR NEGATIVE  BILIRUBINUR NEGATIVE  KETONESUR 15*  PROTEINUR NEGATIVE  UROBILINOGEN 0.2  NITRITE NEGATIVE  LEUKOCYTESUR NEGATIVE       Component Value Date/Time   CHOL 317* 07/25/2014 0016   TRIG 154* 07/25/2014 0016   HDL 46 07/25/2014 0016   CHOLHDL 6.9 07/25/2014 0016   VLDL 31 07/25/2014 0016   LDLCALC 240* 07/25/2014 0016   Lab Results  Component Value Date   HGBA1C 7.9* 07/03/2014      Component Value Date/Time   LABOPIA NONE DETECTED 07/24/2014 1616   COCAINSCRNUR NONE DETECTED 07/24/2014 1616   LABBENZ NONE DETECTED 07/24/2014 1616   AMPHETMU NONE DETECTED 07/24/2014 1616   THCU NONE DETECTED 07/24/2014 1616   LABBARB NONE DETECTED 07/24/2014 1616     Recent Labs Lab 07/24/14 1452  ETH <5   I have personally reviewed the radiological images below and agree with the radiology interpretations.  Ct Angio Head and neck W/cm &/or Wo Cm  07/24/2014  IMPRESSION: 1. Acute right MCA territory infarcts. No intracranial hemorrhage identified. 2. Critical proximal right ICA stenosis. 3. Right MCA M2 inferior division occlusion. 4. Moderate right P2 PCA stenosis.     Mri and Mra Head/brain Wo Cm  07/25/2014   IMPRESSION: MRI HEAD IMPRESSION:  1. Multi focal patchy acute right MCA territory infarcts. Overall distribution is stable from prior CT. There is very minimal associated petechial hemorrhage without evidence for hemorrhagic conversion. 2. Additional 8 mm cortical infarct within the parasagittal cortical gray matter of the anterior right frontal lobe, likely right ACA in distribution. 3. Mild cerebral atrophy with chronic microvascular ischemic disease.  MRA HEAD IMPRESSION:  1. Attenuated flow throughout the visualized right ICA and MCA arteries, likely related to the critical  stenosis seen proximally within the neck on prior CTA. Flow within the right M1 segment is thready and irregular, with attenuated distal right MCA branch flow. 2. Right MCA M2 inferior division occlusion, stable from prior CTA. 3. Irregular moderate right P2 stenosis.   Electronically Signed   By: Jeannine Boga M.D.   On: 07/25/2014 02:07   Ct Angio Chest Aorta W/cm &/or Wo/cm  07/24/2014    IMPRESSION: 1. No great vessel or ascending aorta findings to explain cerebral infarct. 2. Extensive atherosclerosis. 3. Emphysema with bilateral pulmonary nodules, up to 6 mm on the right. Given risk factors for bronchogenic carcinoma, follow-up chest CT at 6 - 12 months is recommended. 4. 45 mm left adrenal mass with internal fat, most consistent with myelolipoma. 5. Cholelithiasis and probable 3 mm gallbladder polyp.     CUS - pending  2D echo - pending   PHYSICAL EXAM  Temp:  [98 F (36.7 C)-98.7 F (37.1 C)] 98.3 F (36.8 C) (04/29 1244) Pulse Rate:  [77-95] 95 (04/29 0739) Resp:  [16-24] 21 (04/29 1244) BP: (103-165)/(58-98) 111/58 mmHg (04/29 1244) SpO2:  [91 %-97 %] 94 % (04/29 1244) Weight:  [210 lb 5.1 oz (95.4 kg)] 210 lb 5.1 oz (95.4 kg) (04/29 0140)  General - Well nourished, well developed, in no apparent distress.  Ophthalmologic - fundi not visualized due to eye movement.  Cardiovascular - Regular rate and rhythm with no murmur.  Mental Status -  Level of arousal and orientation to time, place, and person were intact. Language including expression, naming, repetition, comprehension was assessed and found intact, but dysarthria. Fund of Knowledge was assessed and was intact.  Cranial Nerves II - XII - II - Visual field intact OU, but pt has left visual extinction with simultaneous stimulation. III, IV, VI - Extraocular movements intact. V - Facial sensation intact bilaterally. VII - left facial droop. VIII - Hearing & vestibular intact bilaterally. X - Palate elevates  symmetrically. XI - Chin turning & shoulder shrug intact bilaterally. XII - Tongue protrusion intact.  Motor Strength - The patient's strength was 0/5 LUE and 3/5 LLE proximal and distal.  Bulk was normal and fasciculations were absent.   Motor Tone - Muscle tone was assessed at the neck and appendages and was increased on the LUE.  Reflexes - The patient's reflexes were 1+ in all extremities and she had no pathological reflexes.  Sensory - Light touch, temperature/pinprick were assessed and were symmetrical.    Coordination - The patient had normal movements in the right hand with no ataxia or dysmetria.  Tremor was absent.  Gait and Station - not tested due to weakness.   ASSESSMENT/PLAN Ms. Carla Hopkins is a 64 y.o. female with history  of HTN, DM presenting with left sided weakness after waking up. She did not receive IV t-PA due to late on arrival.   Stroke:  Non-dominant right MCA territory infarct, embolic pattern secondary to right ICA high grade stenosis. Vascular surgery consult recommended.  Resultant  Left hemiparesis, left facial droop, left visual extinction  MRI  Right MCA territory embolic infarct  MRA  Right ICA high grade stenosis and right M2 occlusion  Carotid Doppler  pending  2D Echo  pending  LDL 240, not at goal  HgbA1c pending  Heparin subq for VTE prophylaxis  Diet NPO time specified  no antithrombotic prior to admission, now on aspirin 325 mg orally every day. Due to large vessel atherosclerosis, we recommend dural antiplatelet with ASA 81 and plavix 75 for 3 months and then plavix alone.   Patient counseled to be compliant with her antithrombotic medications  Ongoing aggressive stroke risk factor management  Therapy recommendations:  pending  Disposition:  pending  Right ICA high grade stenosis  Recommend vascular surgery consultation to consider right CEA within 2 weeks  Recommend dural antiplatelet with ASA 81 and plavix 75 for 3  months and then plavix alone.   Avoid hypotension  BP goal 120-160 until CEA done  Hypertension  Home meds:   Lisinopril and HCTZ and metoprolol  Stable  No BP meds now  avoid hypotension  BP goal 120-160 until CEA done  Patient counseled to be compliant with her blood pressure medications  Hyperlipidemia  Home meds:  none  LDL 240, goal < 70  Add lipitor 40mg    Continue statin at discharge  Diabetes  HgbA1c pending, goal < 7.0  SSI  CBG monitoring  Tobacco abuse  Current smoker  Smoking cessation counseling provided  Pt is willing to quit  Other Stroke Risk Factors  Advanced age  Obesity, Body mass index is 37.83 kg/(m^2).   Other Active Problems  Elevated troponin  Other Pertinent History    Hospital day # 1  Rosalin Hawking, MD PhD Stroke Neurology 07/25/2014 1:32 PM   To contact Stroke Continuity provider, please refer to [email or web address] After hours, contact General Neurology

## 2014-07-25 NOTE — Clinical Social Work Note (Signed)
Clinical Social Work Assessment  Patient Details  Name: Carla Hopkins MRN: [id number] Date of Birth: February 12, 1951  Date of referral:  07/25/14               Reason for consult:  Facility Placement              Housing/Transportation Living arrangements for the past 2 months:  Single Family Home Source of Information:  Patient Patient Interpreter Needed:  None Criminal Activity/Legal Involvement Pertinent to Current Situation/Hospitalization:  No - Comment as needed Significant Relationships:  Adult Children Lives with:  Self Do you feel safe going back to the place where you live?  Yes Need for family participation in patient care:  No (Coment)  Care giving concerns:  None   Facilities manager / plan:  CSW met the pt and the pt's Carla Hopkins at the bedside. CSW introduced self and purpose of the visit. CSW discussed SNF rehab. CSW explained the SNF process. CSW explained insurance and its relation to SNF placement. CSW provided the pt and Carla Hopkins a SNF list. CSW and family discussed geographic location in which the pt would like to receive rehab. CSW answered all questions in which the family inquired about. CSW provided family with contact information for further questions. CSW will continue to follow this pt and assist with discharge as needed.   Insurance information:  Other (Comment Required) Nurse, mental health) PT Recommendations:  [place] / Referral to community resources:  [place]  Patient/Family's Response to care:  The pt was thankful.    Patient/Family's Understanding of and Emotional Response to Diagnosis, Current Treatment, and Prognosis:  Pt reported being thankful that she is still alive. Pt provided CSW detail about the event which led to admission. Pt reported being afraid for her life because she knew something was wrong with her and no one was there to aid her.   Emotional Assessment Appearance:  Appears stated  age Attitude/Demeanor/Rapport:   (Positive) Affect (typically observed):  Appropriate Orientation:  Oriented to Situation, Oriented to  Time, Oriented to Place, Oriented to Self Alcohol / Substance use:  Not Applicable Psych involvement (Current and /or in the community):  No (Comment)  Discharge Needs  Concerns to be addressed:  Denies Needs/Concerns at this time Current discharge risk:  Lives alone Barriers to Discharge:  No Barriers Identified  [place], MSW, [place]

## 2014-07-26 DIAGNOSIS — I63311 Cerebral infarction due to thrombosis of right middle cerebral artery: Secondary | ICD-10-CM

## 2014-07-26 DIAGNOSIS — I6789 Other cerebrovascular disease: Secondary | ICD-10-CM

## 2014-07-26 DIAGNOSIS — E039 Hypothyroidism, unspecified: Secondary | ICD-10-CM

## 2014-07-26 LAB — CBC
HCT: 44.7 % (ref 36.0–46.0)
HEMOGLOBIN: 14.6 g/dL (ref 12.0–15.0)
MCH: 28.2 pg (ref 26.0–34.0)
MCHC: 32.7 g/dL (ref 30.0–36.0)
MCV: 86.5 fL (ref 78.0–100.0)
Platelets: 304 10*3/uL (ref 150–400)
RBC: 5.17 MIL/uL — AB (ref 3.87–5.11)
RDW: 15.8 % — ABNORMAL HIGH (ref 11.5–15.5)
WBC: 12.2 10*3/uL — AB (ref 4.0–10.5)

## 2014-07-26 LAB — GLUCOSE, CAPILLARY
GLUCOSE-CAPILLARY: 144 mg/dL — AB (ref 70–99)
GLUCOSE-CAPILLARY: 153 mg/dL — AB (ref 70–99)
Glucose-Capillary: 174 mg/dL — ABNORMAL HIGH (ref 70–99)
Glucose-Capillary: 141 mg/dL — ABNORMAL HIGH (ref 70–99)

## 2014-07-26 LAB — HEMOGLOBIN A1C
Hgb A1c MFr Bld: 8.5 % — ABNORMAL HIGH (ref 4.8–5.6)
MEAN PLASMA GLUCOSE: 197 mg/dL

## 2014-07-26 MED ORDER — CLOPIDOGREL BISULFATE 75 MG PO TABS
75.0000 mg | ORAL_TABLET | Freq: Every day | ORAL | Status: DC
  Administered 2014-07-26 – 2014-07-30 (×5): 75 mg via ORAL
  Filled 2014-07-26 (×5): qty 1

## 2014-07-26 MED ORDER — ATORVASTATIN CALCIUM 80 MG PO TABS
80.0000 mg | ORAL_TABLET | Freq: Every day | ORAL | Status: DC
  Administered 2014-07-26 – 2014-07-29 (×4): 80 mg via ORAL
  Filled 2014-07-26 (×5): qty 1

## 2014-07-26 MED ORDER — ASPIRIN 81 MG PO CHEW
81.0000 mg | CHEWABLE_TABLET | Freq: Every day | ORAL | Status: DC
  Administered 2014-07-26 – 2014-07-30 (×5): 81 mg via ORAL
  Filled 2014-07-26 (×5): qty 1

## 2014-07-26 MED ORDER — SODIUM CHLORIDE 0.9 % IV SOLN
INTRAVENOUS | Status: AC
  Administered 2014-07-26: 05:00:00 via INTRAVENOUS

## 2014-07-26 MED ORDER — SODIUM CHLORIDE 0.9 % IV SOLN
INTRAVENOUS | Status: DC
  Filled 2014-07-26: qty 1000

## 2014-07-26 NOTE — Progress Notes (Signed)
STROKE TEAM PROGRESS NOTE   HISTORY Carla Hopkins is an 64 y.o. female with a history of hypertension and hypothyroidism resenting with new onset weakness involving left side. Patient was last known well at bedtime last night at 10:30 PM. She fell when she attempted to get out of bed this morning and was unable to get up from the floor. She has not been on antiplatelet therapy. Is no previous history of stroke nor TIA. Initial laboratory studies showed an elevation in troponin of 1.8. CT scan of the head as well as CTA of head and neck and chest, is pending. NIH stroke score was 10.  LSN: 10:30 PM on 07/23/2014 tPA Given: No: Beyond time under for treatment consideration mRankin:  SUBJECTIVE (INTERVAL HISTORY) Her daughter is at the bedside.  Overall she feels her condition is stable. Her CTA head and neck showed right ICA high grade stenosis and right M2 occlusion.   OBJECTIVE Temp:  [97.5 F (36.4 C)-98.7 F (37.1 C)] 97.9 F (36.6 C) (04/30 0706) Pulse Rate:  [62-88] 62 (04/30 0348) Cardiac Rhythm:  [-] Normal sinus rhythm (04/30 0800) Resp:  [13-21] 15 (04/30 0706) BP: (97-129)/(58-79) 119/78 mmHg (04/30 0706) SpO2:  [92 %-96 %] 92 % (04/30 0706)   Recent Labs Lab 07/25/14 1247 07/25/14 1549 07/25/14 2156 07/26/14 0818  GLUCAP 170* 175* 144* 174*    Recent Labs Lab 07/24/14 1452 07/24/14 1518  NA 138 139  K 3.7 3.7  CL 103 103  CO2 24  --   GLUCOSE 196* 197*  BUN 18 22  CREATININE 0.88 0.80  CALCIUM 9.4  --     Recent Labs Lab 07/24/14 1452  AST 42*  ALT 26  ALKPHOS 89  BILITOT 0.6  PROT 7.5  ALBUMIN 3.6    Recent Labs Lab 07/24/14 1452 07/24/14 1518 07/25/14 0250 07/26/14 0332  WBC 18.9*  --  15.5* 12.2*  NEUTROABS 16.1*  --   --   --   HGB 16.1* 18.4* 16.0* 14.6  HCT 47.4* 54.0* 47.8* 44.7  MCV 85.6  --  86.0 86.5  PLT 319  --  331 304    Recent Labs Lab 07/24/14 1452 07/24/14 2151 07/25/14 0250 07/25/14 0820 07/25/14 1030   CKTOTAL 510*  --   --   --  229*  CKMB  --   --   --   --  16.7*  TROPONINI  --  4.17* 3.71* 3.07*  --     Recent Labs  07/24/14 1452  LABPROT 13.0  INR 0.97    Recent Labs  07/24/14 1616  COLORURINE YELLOW  LABSPEC 1.023  PHURINE 5.5  GLUCOSEU 100*  HGBUR NEGATIVE  BILIRUBINUR NEGATIVE  KETONESUR 15*  PROTEINUR NEGATIVE  UROBILINOGEN 0.2  NITRITE NEGATIVE  LEUKOCYTESUR NEGATIVE       Component Value Date/Time   CHOL 317* 07/25/2014 0016   TRIG 154* 07/25/2014 0016   HDL 46 07/25/2014 0016   CHOLHDL 6.9 07/25/2014 0016   VLDL 31 07/25/2014 0016   LDLCALC 240* 07/25/2014 0016   Lab Results  Component Value Date   HGBA1C 8.5* 07/25/2014      Component Value Date/Time   LABOPIA NONE DETECTED 07/24/2014 1616   COCAINSCRNUR NONE DETECTED 07/24/2014 1616   LABBENZ NONE DETECTED 07/24/2014 1616   AMPHETMU NONE DETECTED 07/24/2014 1616   THCU NONE DETECTED 07/24/2014 1616   LABBARB NONE DETECTED 07/24/2014 1616     Recent Labs Lab 07/24/14 1452  ETH <5   I  have personally reviewed the radiological images below and agree with the radiology interpretations.  Ct Angio Head and neck W/cm &/or Wo Cm  07/24/2014   IMPRESSION: 1. Acute right MCA territory infarcts. No intracranial hemorrhage identified. 2. Critical proximal right ICA stenosis. 3. Right MCA M2 inferior division occlusion. 4. Moderate right P2 PCA stenosis.     Mri and Mra Head/brain Wo Cm  07/25/2014   IMPRESSION: MRI HEAD IMPRESSION:  1. Multi focal patchy acute right MCA territory infarcts. Overall distribution is stable from prior CT. There is very minimal associated petechial hemorrhage without evidence for hemorrhagic conversion. 2. Additional 8 mm cortical infarct within the parasagittal cortical gray matter of the anterior right frontal lobe, likely right ACA in distribution. 3. Mild cerebral atrophy with chronic microvascular ischemic disease.  MRA HEAD IMPRESSION:  1. Attenuated flow throughout  the visualized right ICA and MCA arteries, likely related to the critical stenosis seen proximally within the neck on prior CTA. Flow within the right M1 segment is thready and irregular, with attenuated distal right MCA branch flow. 2. Right MCA M2 inferior division occlusion, stable from prior CTA. 3. Irregular moderate right P2 stenosis.   Electronically Signed   By: Jeannine Boga M.D.   On: 07/25/2014 02:07   Ct Angio Chest Aorta W/cm &/or Wo/cm  07/24/2014    IMPRESSION: 1. No great vessel or ascending aorta findings to explain cerebral infarct. 2. Extensive atherosclerosis. 3. Emphysema with bilateral pulmonary nodules, up to 6 mm on the right. Given risk factors for bronchogenic carcinoma, follow-up chest CT at 6 - 12 months is recommended. 4. 45 mm left adrenal mass with internal fat, most consistent with myelolipoma. 5. Cholelithiasis and probable 3 mm gallbladder polyp.     CUS - pending  2D echo - pending   PHYSICAL EXAM  Temp:  [97.5 F (36.4 C)-98.7 F (37.1 C)] 97.9 F (36.6 C) (04/30 0706) Pulse Rate:  [62-88] 62 (04/30 0348) Resp:  [13-21] 15 (04/30 0706) BP: (97-129)/(58-79) 119/78 mmHg (04/30 0706) SpO2:  [92 %-96 %] 92 % (04/30 0706)  General - Well nourished, well developed, in no apparent distress.  Ophthalmologic - fundi not visualized due to eye movement.  Cardiovascular - Regular rate and rhythm with no murmur.  Mental Status -  Level of arousal and orientation to time, place, and person were intact. Language including expression, naming, repetition, comprehension was assessed and found intact, but dysarthria. Fund of Knowledge was assessed and was intact.  Cranial Nerves II - XII - II - Visual field intact OU, but pt has left visual extinction with simultaneous stimulation. III, IV, VI - Extraocular movements intact. V - Facial sensation intact bilaterally. VII - left facial droop. VIII - Hearing & vestibular intact bilaterally. X - Palate  elevates symmetrically. XI - Chin turning & shoulder shrug intact bilaterally. XII - Tongue protrusion intact.  Motor Strength - The patient's strength was 0/5 LUE and 3/5 LLE proximal and distal.  Bulk was normal and fasciculations were absent.   Motor Tone - Muscle tone was assessed at the neck and appendages and was increased on the LUE.  Reflexes - The patient's reflexes were 1+ in all extremities and she had no pathological reflexes.  Sensory - Light touch, temperature/pinprick were assessed and were symmetrical.    Coordination - The patient had normal movements in the right hand with no ataxia or dysmetria.  Tremor was absent.  Gait and Station - not tested due to weakness.  ASSESSMENT/PLAN Ms. Amaranta Mehl is a 64 y.o. female with history of HTN, DM presenting with left sided weakness after waking up. She did not receive IV t-PA due to late on arrival.   Stroke:  Non-dominant right MCA territory infarct, embolic pattern secondary to right ICA high grade stenosis. Vascular surgery consult recommended.  Resultant  Left hemiparesis, left facial droop, left visual extinction- no change on exam today  MRI  Right MCA territory embolic infarct  MRA  Right ICA high grade stenosis and right M2 occlusion  Carotid Doppler  pending  2D Echo  pending  LDL 240, not at goal  Heparin subq for VTE prophylaxis DIET DYS 2 Room service appropriate?: Yes; Fluid consistency:: Nectar Thick  no antithrombotic prior to admission, now on aspirin 325 mg orally every day. Due to large vessel atherosclerosis, we recommend dural antiplatelet with ASA 81 and plavix 75 for 3 months and then plavix alone based on SAPRISS trial.   Patient counseled to be compliant with her antithrombotic medications  Ongoing aggressive stroke risk factor management  Therapy recommendations:  pending  Disposition:  Pending  S/p discussion with daughter at bedside.   Right ICA high grade stenosis  Recommend  vascular surgery consultation to consider right CEA within 2 weeks  Recommend dural antiplatelet with ASA 81 and plavix 75 for 3 months and then plavix alone.   Avoid hypotension  BP goal 120-160 until CEA done  Appreciate vascular eval. CEA in 2 weeks.    Hypertension  Home meds:   Lisinopril and HCTZ and metoprolol  Stable  No BP meds now  avoid hypotension  BP goal 120-160 until CEA done  Patient counseled to be compliant with her blood pressure medications  Hyperlipidemia  Home meds:  none  LDL 240, goal < 70  Add lipitor 40mg    Continue statin at discharge  Diabetes  HgbA1c pending, goal < 7.0  SSI  CBG monitoring  Tobacco abuse  Current smoker  Smoking cessation counseling provided  Pt is willing to quit  Other Stroke Risk Factors  Advanced age  Obesity, Body mass index is 37.83 kg/(m^2).   Other Active Problems  Elevated troponin  Other Pertinent History    Hospital day # [place], [place]  Stroke Neurology 07/26/2014 9:39 AM   To contact Stroke Continuity provider, please refer to [email or web address] After hours, contact General Neurology

## 2014-07-26 NOTE — Progress Notes (Signed)
Patient placed herself on home CPAP unit.  

## 2014-07-26 NOTE — Progress Notes (Signed)
Patient arrived to 4N20. Patient is alert and oriented, neuro assessment unchanged from report, patient denies any pain, headache, nausea. Tele set up, SCDs on, patient oriented to room and unit. Patient taken off of nasal cannula, o2 saturation 94% on room air. Patient is now resting in bed eating, call bell within patient's reach. Bed alarm on. Will continue to monitor.

## 2014-07-26 NOTE — Progress Notes (Signed)
Speech Language Pathology Treatment: Dysphagia  Patient Details Name: Carla Hopkins MRN: [id number] DOB: 20-Jan-1951 Today's Date: 07/26/2014 Time: 0315-9458 SLP Time Calculation (min) (ACUTE ONLY): 15 min  Assessment / Plan / Recommendation Clinical Impression  Diet tolerance assessment complete. Po trials provided to assess for tolerance, appropriateness for diet upgrade, and use of compensatory strategies. Patient required min verbal cueing for use of lingual sweep to clear very subtle left sided buccal residue with dysphagia 3 solids. Nectar thick liquids consumed independently without overt indication of aspiration. Patient reporting dislike for consuming meds whole in puree. Skilled observation complete with RN providing meds whole with nectar thick liquids. Patient consuming well without evidence of difficulty or signs of aspiration. Overall, oral function appears to be improving without anterior labial spillage and with decreased residue. Offered trials of thin liquids however patient declined today. Recommend continuing current diet with trials of thin liquids at bedside for potential upgrade next visit.    HPI Other Pertinent Information: 64 year old female adm with left sided weakness and dysarthria.  Brain CT reveals a Right MCA CVA.  Incidental findings of pulmonary nodules (on CT).  PMH:  HTN   Pertinent Vitals Pain Assessment: No/denies pain  SLP Plan  Continue with current plan of care    Recommendations Diet recommendations: Dysphagia 2 (fine chop);Nectar-thick liquid Liquids provided via: Cup;No straw Medication Administration: Whole meds with liquid Supervision: Patient able to self feed;Full supervision/cueing for compensatory strategies Compensations: Slow rate;Small sips/bites;Check for pocketing;Check for anterior loss Postural Changes and/or Swallow Maneuvers: Seated upright 90 degrees              General recommendations: Rehab consult Oral Care Recommendations:  Oral care BID Follow up Recommendations: Inpatient Rehab Plan: Continue with current plan of care    Richardson [place], [place] [phone]   [place] 07/26/2014, 10:04 AM

## 2014-07-26 NOTE — Progress Notes (Signed)
Family Medicine Teaching Service Daily Progress Note Intern Pager: [phone]  Patient name: Carla Hopkins Medical record number: [id number] Date of birth: January 01, 1951 Age: 64 y.o. Gender: female  Primary Care Provider: Ellsworth Lennox, MD Consultants: Neurology, Cardiology, Vascular Code Status: Full  Pt Overview and Major Events to Date:  4/29 - Admitted with right MCA CVA  Assessment and Plan: Carla Hopkins is a 64 y.o. female presenting with acute left sided weakness found to have a right MCA CVA on CT. PMH is significant for HTN, Hypothyroidism, and T2DM. Also with elevated troponin, and incidentally found pulmonary nodules here.   Right MCA CVA. Deficits include flaccid paralysis of LUE, left facial droop, and dysarthric speech. CT and MRI consistent with acute right MCA infarcts and minimal petechial hemorrhage. Not a candidate for tpa. Right ICA and MCA stenosis visualized on MRA and CTA. Risk factors include HTN, approximately 50 pack-year smoking history, T2DM, and HLD. TSH wnl.  - Neurology consulted, appreciate assistance - carotid dopplers pending.  - Vascular surgery consulted for CEA evaluation - plan for intervention in 2 weeks - CIR consulted for placement - ASA 81mg  and plavix 75mg  daily for 3 months, then plavix alone per neurology - Will need aggressive risk factor modification  Elevated Troponin. Troponin  1.82 > 4.17 > 3.71 > 3.07. No chest pain, SOB, or palpitations. Per cardiology, no evidence for ACS. Possibly due to rhabdomyolysis. Echo with normal EF, grade 1 diastolic dysfunction, mild LVH. No further work up at this time.  - Continue to monitor for signs of ACS  HLD. Lipid panel here: Total cholesterol 317, TG 154, LDL 240. Not on any medications. 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 Also with HLD on lipid panel in 2015.  - Start atorvastatin 80mg  here.  T2DM. A1c 8.5. Not on any home medications.  - SSI  HTN. At goal.  - Add back lisinopril-HCTZ and  metoprolol as indicated  Hypothyroidism. TSH wnl - Continue home synthroid   Mild Rhabdomyolysis. CK 510 > 229. Likely due to being down on floor for several hours prior to arrival.  - Continue to monitor  Leukocytosis. WBC 18.9 on admission. Likely acute stress reaction. Improving. - Continue to monitor.   Pulmonary Nodules. Bilateral pulmonary nodules found on CT incidentally. Concern for bronchiogenic carcinoma.  - Follow up in 6-12 months per radiology  Rash. Blanchable papular rash on anterior, medial, and lateral aspects of thighs. Possibly irritation/abrasion from crawling on floor. - Continue to monitor  OSA. - CPAP at night  FEN/GI: Dysphagia 2 diet per SLP Prophylaxis: SubQ heparin OK per neuro  Disposition: Admitted to stepdown pending above management. Will transfer to floor today. Anticipate discharge to CIR when medically stable  Subjective:  No complaints this morning. No chest pain or shortness of breath. No changes in neurologic deficits.   Objective: Temp:  [97.5 F (36.4 C)-98.7 F (37.1 C)] 97.9 F (36.6 C) (04/30 0706) Pulse Rate:  [62-88] 62 (04/30 0348) Resp:  [13-21] 15 (04/30 0706) BP: (97-129)/(58-79) 119/78 mmHg (04/30 0706) SpO2:  [92 %-96 %] 92 % (04/30 0706) Physical Exam: General: 64 year old female lying in hospital bed in no acute distress HEENT: NCAT, MMM, xanthoma noted on left inferior eye lid,  Cardiovascular: RRR, 2/6 systolic murmur.  Respiratory: NWOB, nasal cannula in place, faint wheezes at bases bilaterally Abdomen: +BS, soft, NT, ND Extremities: No edema, WWP Skin: Blanchable papular rash noted on anterior, medial, and lateral aspect of thighs bilaterally.  Neuro: Alert and conversational. Dysarthric speech.  Seft facial droop noted LUE 0/5 strength. RUE with normal strength. Bilateral lower extremities with 5/5 strength. No sensation in LUE. Sensation intact otherwise. Gait deferred.  Laboratory/Imaging:  Recent  Labs Lab 07/24/14 1452 07/24/14 1518 07/25/14 0250 07/26/14 0332  WBC 18.9*  --  15.5* 12.2*  HGB 16.1* 18.4* 16.0* 14.6  HCT 47.4* 54.0* 47.8* 44.7  PLT 319  --  331 304    Recent Labs Lab 07/24/14 1452 07/24/14 1518  NA 138 139  K 3.7 3.7  CL 103 103  CO2 24  --   BUN 18 22  CREATININE 0.88 0.80  CALCIUM 9.4  --   PROT 7.5  --   BILITOT 0.6  --   ALKPHOS 89  --   ALT 26  --   AST 42*  --   GLUCOSE 196* 197*    Recent Labs Lab 07/25/14 1247 07/25/14 1549 07/25/14 2156  GLUCAP 170* 175* 144*   TSH 2.151  Echo: - Left ventricle: The cavity size was mildly reduced. Wall thickness was increased in a pattern of mild LVH. Systolic function was vigorous. The estimated ejection fraction was in the range of 65% to 70%. Doppler parameters are consistent with abnormal left ventricular relaxation (grade 1 diastolic dysfunction). - Aortic valve: AV is thickened, calcified Difficult to see well. Peak and mean gradients through the LV/LVOT and AV are 34 and 18 mm Hg. By 2D imaging the valve, though not optimally seen, does not appear to be stenotic. Some of gradient may be due to dynamic obstruction from below valve . - Mitral valve: Calcified annulus. Mildly thickened leaflets . - Left atrium: The atrium was moderately dilated.   Vivi Barrack, MD 07/26/2014, 8:22 AM PGY-1, [place] Intern pager: [phone], text pages welcome

## 2014-07-26 NOTE — Progress Notes (Signed)
VASCULAR LAB PRELIMINARY  PRELIMINARY  PRELIMINARY  PRELIMINARY  Carotid duplex  completed.    Preliminary report:  Right:  Greater than 80% internal carotid artery stenosis.  Left:  1-39% ICA stenosis.  Bilateral:  Vertebral artery flow is antegrade.     Liyla Radliff, RVT 07/26/2014, 11:44 AM

## 2014-07-26 NOTE — Progress Notes (Signed)
Received orders to transfer pt. Pt and pt's daughter aware of transfer. Report called to RN on 4N. No s/s of acute distress noted.

## 2014-07-26 NOTE — Progress Notes (Signed)
RT entered room to place patient on CPAP and patient stated she places herself on it at night before she goes to sleep without any help. RT informed patient to have RN contact RT if she needed any help.

## 2014-07-26 NOTE — Evaluation (Addendum)
Speech Language Pathology Evaluation Patient Details Name: Carla Hopkins MRN: [id number] DOB: November 26, 1950 Today's Date: 07/26/2014 Time: 0865-7846 SLP Time Calculation (min) (ACUTE ONLY): 32 min  Problem List:  Patient Active Problem List   Diagnosis Date Noted  . Left-sided weakness   . Stroke   . Essential hypertension   . Carotid artery stenosis   . CVA (cerebral infarction) 07/24/2014  . Hyperlipidemia 07/24/2014  . Diabetes 07/24/2014  . Elevated troponin 07/24/2014  . Tobacco abuse 07/24/2014  . Benign essential HTN 07/23/2014  . Obstructive sleep apnea 07/23/2014  . Hypothyroidism 07/03/2014  . Type II or unspecified type diabetes mellitus without mention of complication, not stated as uncontrolled 09/25/2013  . BMI 38.0-38.9,adult 05/19/2013  . Nicotine addiction 05/19/2013  . Right atrial enlargement 05/19/2013   Past Medical History:  Past Medical History  Diagnosis Date  . Hypothyroidism   . Hypertension    Past Surgical History:  Past Surgical History  Procedure Laterality Date  . Tubal ligation     HPI:  64 year old female adm with left sided weakness and dysarthria.  Brain CT reveals a Right MCA CVA.  Incidental findings of pulmonary nodules (on CT).  PMH:  HTN   Assessment / Plan / Recommendation Clinical Impression  Cognitive-linguistic evaluation complete. Patient with CN VII and XII involvement impacting oral strength and sensation and resulting in a moderate dysarthria. Cognitively, patient mildly impulsive and although able to answer problem solving, reasoning questions with 100% accuracy, daughter reports difficulty with functional moderately complex cogntive tasks (navigating cell phone, etc). Therapuetically, extensive education complete with patient and daughter regarding results of exam, deficits, prognosis, compensatory strategies, and d/c planning in regards to cognitive impairements. Patient overall with good anticipatory awareness. Recommend SLP  f/u for dysarthria and diagnostic treatment for high level cognition. CIR recommended after d/c.     SLP Assessment  Patient needs continued Speech Lanaguage Pathology Services    Follow Up Recommendations  Inpatient Rehab    Frequency and Duration min 2x/week  2 weeks   Pertinent Vitals/Pain Pain Assessment: No/denies pain   SLP Goals  Progression toward goals: Progressing toward goals Potential to Achieve Goals (ACUTE ONLY): Good  SLP Evaluation Prior Functioning  Cognitive/Linguistic Baseline: Within functional limits Type of Home: House  Lives With: Alone Vocation: Full time employment   Cognition  Overall Cognitive Status: Impaired/Different from baseline Arousal/Alertness: Awake/alert Orientation Level: Oriented X4 Attention: Sustained Sustained Attention: Appears intact Memory: Appears intact Awareness: Appears intact Problem Solving: Appears intact (see clinical impression statement) Safety/Judgment: Appears intact    Comprehension  Auditory Comprehension Overall Auditory Comprehension: Appears within functional limits for tasks assessed (mildly impulsive) Visual Recognition/Discrimination Discrimination: Within Function Limits Reading Comprehension Reading Status: Within funtional limits    Expression Expression Primary Mode of Expression: Verbal Verbal Expression Overall Verbal Expression: Appears within functional limits for tasks assessed Written Expression Dominant Hand: Right Written Expression: Not tested   Oral / Motor Oral Motor/Sensory Function Overall Oral Motor/Sensory Function: Impaired Labial ROM: Reduced left Labial Symmetry: Abnormal symmetry left Labial Strength: Reduced Labial Sensation: Reduced Lingual ROM: Reduced left Lingual Symmetry: Abnormal symmetry left Lingual Strength: Reduced Lingual Sensation: Reduced Facial ROM: Reduced left Facial Symmetry: Left droop Facial Strength: Reduced Facial Sensation: Reduced Velum:  Within Functional Limits Mandible: Within Functional Limits Motor Speech Overall Motor Speech: Impaired Respiration: Within functional limits Phonation: Normal Resonance: Within functional limits Articulation: Impaired Level of Impairment: Conversation Intelligibility: Intelligibility reduced Word: 75-100% accurate Phrase: 75-100% accurate Sentence: 75-100% accurate Conversation:  75-100% accurate Motor Planning: Witnin functional limits   [place], [place] [phone]   Carla Hopkins Carla Hopkins 07/26/2014, 11:06 AM

## 2014-07-27 DIAGNOSIS — I6529 Occlusion and stenosis of unspecified carotid artery: Secondary | ICD-10-CM

## 2014-07-27 DIAGNOSIS — I638 Other cerebral infarction: Secondary | ICD-10-CM

## 2014-07-27 LAB — GLUCOSE, CAPILLARY
GLUCOSE-CAPILLARY: 193 mg/dL — AB (ref 70–99)
GLUCOSE-CAPILLARY: 286 mg/dL — AB (ref 70–99)
Glucose-Capillary: 180 mg/dL — ABNORMAL HIGH (ref 70–99)
Glucose-Capillary: 111 mg/dL — ABNORMAL HIGH (ref 70–99)

## 2014-07-27 MED ORDER — HYDROCHLOROTHIAZIDE 12.5 MG PO CAPS
12.5000 mg | ORAL_CAPSULE | Freq: Every day | ORAL | Status: DC
  Administered 2014-07-27 – 2014-07-30 (×4): 12.5 mg via ORAL
  Filled 2014-07-27 (×4): qty 1

## 2014-07-27 MED ORDER — ASPIRIN 81 MG PO CHEW: 81.0000 mg | CHEWABLE_TABLET | Freq: Every day | ORAL | Status: DC

## 2014-07-27 MED ORDER — SALINE SPRAY 0.65 % NA SOLN
1.0000 | NASAL | Status: DC | PRN
  Administered 2014-07-27 (×3): 1 via NASAL
  Filled 2014-07-27: qty 44

## 2014-07-27 MED ORDER — CLOPIDOGREL BISULFATE 75 MG PO TABS: 75.0000 mg | ORAL_TABLET | Freq: Every day | ORAL | Status: DC

## 2014-07-27 MED ORDER — LISINOPRIL-HYDROCHLOROTHIAZIDE 20-12.5 MG PO TABS: 1.0000 | ORAL_TABLET | Freq: Every day | ORAL | Status: DC

## 2014-07-27 MED ORDER — ATORVASTATIN CALCIUM 80 MG PO TABS: 80.0000 mg | ORAL_TABLET | Freq: Every day | ORAL | Status: DC

## 2014-07-27 MED ORDER — LISINOPRIL 5 MG PO TABS
5.0000 mg | ORAL_TABLET | Freq: Every day | ORAL | Status: DC
  Administered 2014-07-27 – 2014-07-30 (×4): 5 mg via ORAL
  Filled 2014-07-27 (×4): qty 1

## 2014-07-27 MED ORDER — LISINOPRIL 20 MG PO TABS
20.0000 mg | ORAL_TABLET | Freq: Every day | ORAL | Status: DC
  Administered 2014-07-27: 20 mg via ORAL
  Filled 2014-07-27: qty 1

## 2014-07-27 NOTE — Progress Notes (Signed)
RT Note;  Pt has home cpap and places self on/off.  Rt will continue to monitor.

## 2014-07-27 NOTE — Progress Notes (Signed)
Family Medicine Teaching Service Daily Progress Note Intern Pager: [phone]  Patient name: Carla Hopkins Medical record number: [id number] Date of birth: 1951/01/02 Age: 64 y.o. Gender: female  Primary Care Provider: Ellsworth Lennox, MD Consultants: Neurology, Cardiology, Vascular Code Status: Full  Assessment and Plan: 64 y.o. female presenting with acute left sided weakness found to have a right MCA CVA on CT. PMH is significant for HTN, Hypothyroidism, and T2DM. Also with elevated troponin, and incidentally found pulmonary nodules here.   # Right MCA CVA. Deficits include flaccid paralysis of LUE, left facial droop, and dysarthric speech. Not a tPA candidate.Risk factors include HTN, approximately 50 pack-year smoking history, T2DM, and HLD - CT and MRI consistent with acute right MCA infarcts and minimal petechial hemorrhage. Right ICA and MCA stenosis visualized on MRA and CTA.  - Neurology consulted, appreciate assistance - Vascular surgery consulted for CEA evaluation   - Carotid Dopplers- >80% stenosis of right internal carotid  - Currently on Aspirin and Statin  - CEA in two weeks - CIR consulted for placement - ASA 81mg  and plavix 75mg  daily for 3 months, then plavix alone per neurology - Will need aggressive risk factor modification  # Elevated Troponin. Troponin  1.82 > 4.17 > 3.71 > 3.07. No chest pain, SOB, or palpitations. Per cardiology, no evidence for ACS. Possibly due to rhabdomyolysis. Echo with normal EF, grade 1 diastolic dysfunction, mild LVH. No further work up at this time.  - Continue to monitor for signs of ACS  # HLD. Lipid panel here: Total cholesterol 317, TG 154, LDL 240. Not on any medications. 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 Also with HLD on lipid panel in 2015.  - Start atorvastatin 80mg  here.  # T2DM. A1c 8.5. Not on any home medications.  - SSI  # Mild Rhabdomyolysis. CK 510 > 229. Likely due to being down on floor for several hours prior to  arrival.  - Continue to monitor  # Leukocytosis. WBC 18.9 on admission. Likely acute stress reaction. Improving. - WBC 12.2 - Continue to monitor.   # Pulmonary Nodules. Bilateral pulmonary nodules found on CT incidentally. Concern for bronchiogenic carcinoma.  - Follow up in 6-12 months per radiology  # Rash. Blanchable papular rash on anterior, medial, and lateral aspects of thighs. Possibly irritation/abrasion from crawling on floor. - Continue to monitor  # Hypertension: - BP 146/93 this morning - Currently holding BP medications--Restarting Lisinopril-HCTZ. Holding Metoprolol, but consider restarting if indicated  FEN/GI: Dysphagia 2 diet per SLP Prophylaxis: SubQ heparin OK per neuro  Disposition:  Anticipate discharge to CIR when medically stable  Subjective:  Feeling well today. Continues to lack voluntary movement in left arm, but family states she moves it when she sneezes and doesn't realize it. Denies sensation from left elbow to hand.  Objective: Temp:  [97.9 F (36.6 C)-98.9 F (37.2 C)] 98.2 F (36.8 C) (05/01 0603) Pulse Rate:  [74-85] 82 (05/01 0603) Resp:  [16-20] 18 (05/01 0603) BP: (110-141)/(61-94) 135/94 mmHg (05/01 0603) SpO2:  [94 %-95 %] 95 % (05/01 0603) Physical Exam: General: 64 year old female lying in hospital bed in no acute distress HEENT: NCAT, MMM, xanthoma noted on left inferior eye lid,  Cardiovascular: RRR, 2/6 systolic murmur.  Respiratory: NWOB, Clear to auscultation bilaterally Abdomen: +BS, soft, NT, ND Extremities: No edema noted Neuro: Alert and conversational. Dysarthric speech. Left facial droop noted, LUE 0/5 strength. RUE with normal strength. Bilateral lower extremities with 5/5 strength. No sensation in LUE from elbow  to hand. Sensation intact otherwise.   Laboratory/Imaging:  Recent Labs Lab 07/24/14 1452 07/24/14 1518 07/25/14 0250 07/26/14 0332  WBC 18.9*  --  15.5* 12.2*  HGB 16.1* 18.4* 16.0* 14.6  HCT 47.4*  54.0* 47.8* 44.7  PLT 319  --  331 304    Recent Labs Lab 07/24/14 1452 07/24/14 1518  NA 138 139  K 3.7 3.7  CL 103 103  CO2 24  --   BUN 18 22  CREATININE 0.88 0.80  CALCIUM 9.4  --   PROT 7.5  --   BILITOT 0.6  --   ALKPHOS 89  --   ALT 26  --   AST 42*  --   GLUCOSE 196* 197*    Recent Labs Lab 07/26/14 0818 07/26/14 1232 07/26/14 1617 07/26/14 2109 07/27/14 0626  GLUCAP 174* 144* 153* 141* 193*   TSH 2.151  Echo: - Left ventricle: The cavity size was mildly reduced. Wall thickness was increased in a pattern of mild LVH. Systolic function was vigorous. The estimated ejection fraction was in the range of 65% to 70%. Doppler parameters are consistent with abnormal left ventricular relaxation (grade 1 diastolic dysfunction). - Aortic valve: AV is thickened, calcified Difficult to see well. Peak and mean gradients through the LV/LVOT and AV are 34 and 18 mm Hg. By 2D imaging the valve, though not optimally seen, does not appear to be stenotic. Some of gradient may be due to dynamic obstruction from below valve . - Mitral valve: Calcified annulus. Mildly thickened leaflets . - Left atrium: The atrium was moderately dilated.  [place], [place] 07/27/2014, 8:01 AM PGY-1, [place] Intern pager: [phone], text pages welcome

## 2014-07-27 NOTE — Progress Notes (Signed)
Cardiology will sign-off. Troponin elevation not though to be due to ACS per Dr. Burt Knack - echo shows normal LV systolic function. Plan for CEA in 2 weeks per vascular surgery. Call with questions.  Pixie Casino, MD, [facility] Attending Cardiologist [place]

## 2014-07-27 NOTE — Evaluation (Signed)
Occupational Therapy Evaluation Patient Details Name: Carla Hopkins MRN: [id number] DOB: 02-18-1951 Today's Date: 07/27/2014    History of Present Illness Patient is a 64 y/o female admitted with left sided weakness postive for right MCA CVA.  PMH positive for HLD, HTN, DM and hypothyroidism.   Clinical Impression   This 64 yo female admitted with above presents to acute OT with decreased use/movement of left side (arm greater than leg), decreased balance, decreased mobility, decreased safety awareness, and obesity all affecting her ability to care for herself at an independent level as she could pta. She will benefit from acute OT with follow up OT on CIR to get to a S level or better for BADLs.    Follow Up Recommendations  CIR    Equipment Recommendations   (TBD at next venue)       Precautions / Restrictions Precautions Precautions: Fall Restrictions Weight Bearing Restrictions: No      Mobility Bed Mobility               General bed mobility comments: Pt up in recliner upon my arrival  Transfers Overall transfer level: Needs assistance Equipment used:  (1 person Bil forearm A in front of her) Transfers: Sit to/from Stand Sit to Stand: Min assist              Balance Overall balance assessment: Needs assistance Sitting-balance support: No upper extremity supported;Feet supported Sitting balance-Leahy Scale: Good     Standing balance support: Bilateral upper extremity supported Standing balance-Leahy Scale: Poor Standing balance comment: Min A sit>stand, mod A for dynamic standing                            ADL Overall ADL's : Needs assistance/impaired Eating/Feeding: Set up;Supervision/ safety (swallowing precautions)   Grooming: Moderate assistance;Sitting   Upper Body Bathing: Moderate assistance;Sitting   Lower Body Bathing: Maximal assistance (with Min A sit<>stand, but Mod A for dynamic balance)   Upper Body Dressing : Moderate  assistance;Sitting   Lower Body Dressing: Total assistance (with Min A sit<>stand, but Mod A for dynamic balance)   Toilet Transfer: Moderate assistance;Ambulation (Bil HHA with me infront of her holding onto gait belt with both UEs; recliner >bed>back to recliner with VC and tactile cues for shifting weight)   Toileting- Clothing Manipulation and Hygiene: Maximal assistance (with Min A sit<>stand, but Mod A for dynamic balance)               Vision Vision Assessment?: Yes Eye Alignment: Within Functional Limits Ocular Range of Motion: Within Functional Limits Alignment/Gaze Preference: Within Defined Limits Tracking/Visual Pursuits: Able to track stimulus in all quads without difficulty Saccades: Additional eye shifts occurred during testing;Undershoots;Decreased speed of saccadic movement Convergence: Within functional limits Visual Fields: No apparent deficits          Pertinent Vitals/Pain Pain Assessment: No/denies pain     Hand Dominance Right   Extremity/Trunk Assessment Upper Extremity Assessment Upper Extremity Assessment: LUE deficits/detail LUE Deficits / Details: Brunstrum 0 forearm and distally; Brunstrum 1-elbow and proximally LUE Sensation: decreased light touch;decreased proprioception (can feel pain (pinch), but still not as strong as is on right arm--and pt is aware of this) LUE Coordination: decreased fine motor;decreased gross motor           Communication Communication Communication: Expressive difficulties (dysarthria)   Cognition Arousal/Alertness: Awake/alert Behavior During Therapy: Impulsive (when getting ready to stand up and work on ambulation) Overall  Cognitive Status: Impaired/Different from baseline Area of Impairment: Attention;Safety/judgement;Awareness;Problem solving   Current Attention Level: Selective     Safety/Judgement: Decreased awareness of safety (decreased awareness of how her deficits affect her safety) Awareness:  Emergent Problem Solving: Difficulty sequencing;Requires verbal cues;Requires tactile cues (for shifting weight when taking steps, especially for LLE)        Exercises   Other Exercises Other Exercises: Went over with pt that she could reach for and pick up her LUE with her RUE just as long as she grabs it from underneath so that her thumb is pointed up and that she can use her RUE to raise her LUE up but not more than 90 degrees. I also went over how her daughter could A her with shoulder and elbow exercises on pt's left side        Home Living Family/patient expects to be discharged to:: Inpatient rehab Living Arrangements: Alone (daughter lives in [place])   Type of Home: House Home Access: Stairs to enter Technical brewer of Steps: 3 Entrance Stairs-Rails: None Home Layout: One level               Home Equipment: None      Lives With: Alone    Prior Functioning/Environment Level of Independence: Independent        Comments: works for [employer]    OT Diagnosis: Generalized weakness;Cognitive deficits;Disturbance of vision;Hemiplegia non-dominant side   OT Problem List: Decreased strength;Decreased range of motion;Impaired balance (sitting and/or standing);Impaired sensation;Decreased safety awareness;Decreased cognition;Impaired UE functional use;Obesity;Decreased knowledge of use of DME or AE;Impaired tone   OT Treatment/Interventions: Self-care/ADL training;Therapeutic exercise;Neuromuscular education;Patient/family education;Visual/perceptual remediation/compensation;DME and/or AE instruction;Cognitive remediation/compensation;Therapeutic activities;Balance training    OT Goals(Current goals can be found in the care plan section) Acute Rehab OT Goals Patient Stated Goal: to rehab then home as well as back to work at [employer] OT Goal Formulation: With patient Time For Goal Achievement: 08/03/14 Potential to Achieve Goals: Good  OT Frequency:  Min 3X/week   Barriers to D/C: Decreased caregiver support             End of Session Equipment Utilized During Treatment: Gait belt Nurse Communication:  (Pt asking about morning meds and a bath)  Activity Tolerance: Patient tolerated treatment well Patient left: in chair;with call bell/phone within reach;with family/visitor present   Time: 1017-1100 OT Time Calculation (min): 43 min Charges:  OT General Charges $OT Visit: 1 Procedure OT Evaluation $Initial OT Evaluation Tier I: 1 Procedure OT Treatments $Self Care/Home Management : 8-22 mins $Therapeutic Activity: 23-37 mins  Almon Register [phone] 07/27/2014, 11:23 AM

## 2014-07-27 NOTE — Discharge Instructions (Signed)
Rehabilitation After a Stroke A stroke can cause many types of problems. The treatment of stroke involves three stages: prevention, treatment immediately following a stroke, and rehabilitation after a stroke. HOW IS MY REHABILITATION PLAN DEVELOPED? A detailed exam by your health care provider helps outline what problems were caused by the stroke. Your health care provider may consult specialists. The specialists may include doctors, occupational and physical therapists, and speech therapists. It is then possible to make a plan that best fits your needs.  Your evaluation might include the following:  Evaluation of your ability to do daily activities that require using muscles, coordination, vision, reasoning, memory, and problem solving. Interviews with you and your health care provider will help determine what you could do and could not do before the stroke.  Evaluation of your ability to do personal self-care tasks, such as dressing, grooming, and eating.  Tests to see if there are sensory and motor changes due to the stroke, especially in the hands and legs. WHAT ARE THE TYPES OF REHABILITATION? Your health care provider may have you start rehabilitation right away depending on the type and severity of your stroke. Rehabilitation after stroke is focused on getting function back and preventing another stroke. Rehabilitation might include:   Physical therapy. This can include help with walking, sitting, lying down, and balance. It may also be designed to help prevent shortening of the muscles (contractures) and swelling (edema).  Occupational therapy. This therapy helps you to relearn skills needed for leading a normal life. These could include eating, using the restroom, dressing, and taking care of yourself. It helps to make you more independent.  Vision therapy. This can help you to retrain, strengthen, and improve your vision after a stroke.  Speech therapy. This can help to improve your  speech and communication skills. It also teaches you and your family members to cope with problems of being unable to communicate.  Cognitive therapy. This therapy can help with problems caused by lack of memory, attention, or concentration.  Psychological or psychiatric therapy. This can help you cope with problems of frustration and emotional problems that may develop after a stroke.  Document Released: 04/03/2007 Document Revised: 07/29/2013 Document Reviewed: 08/16/2012 ExitCare Patient Information 2015 [employer]. This information is not intended to replace advice given to you by your health care provider. Make sure you discuss any questions you have with your health care provider.  

## 2014-07-28 LAB — GLUCOSE, CAPILLARY
Glucose-Capillary: 177 mg/dL — ABNORMAL HIGH (ref 70–99)
Glucose-Capillary: 183 mg/dL — ABNORMAL HIGH (ref 70–99)
Glucose-Capillary: 137 mg/dL — ABNORMAL HIGH (ref 70–99)
Glucose-Capillary: 171 mg/dL — ABNORMAL HIGH (ref 70–99)

## 2014-07-28 NOTE — Progress Notes (Signed)
Chaplain responded to nurse referral for advanced directive. Chaplain reviewed contents with pt and family. Chaplain facilitated completion. Copy placed in chart and nurse informed.    07/28/14 1400  Clinical Encounter Type  Visited With Patient and family together;Health care provider  Visit Type Initial;Spiritual support  Referral From Nurse  Spiritual Encounters  Spiritual Needs Emotional  Stress Factors  Patient Stress Factors Health changes  Family Stress Factors Health changes  Jannelly Bergren, Barbette Hair, Chaplain 07/28/2014 2:16 PM

## 2014-07-28 NOTE — Progress Notes (Signed)
Family Medicine Teaching Service Daily Progress Note Intern Pager: [phone]  Patient name: Carla Hopkins Medical record number: [id number] Date of birth: 03-02-1951 Age: 64 y.o. Gender: female  Primary Care Provider: Ellsworth Lennox, MD Consultants: Neurology, Cardiology, Vascular Code Status: Full  Assessment and Plan: 64 y.o. female presenting with acute left sided weakness found to have a right MCA CVA on CT. PMH is significant for HTN, Hypothyroidism, and T2DM. Also with elevated troponin, and incidentally found pulmonary nodules here.   # Right MCA CVA. Deficits include flaccid paralysis of LUE, left facial droop, and dysarthric speech. CT and MRI consistent with acute right MCA infarcts and minimal petechial hemorrhage. Not a tPA candidate. Risk factors include HTN, approximately 50 pack-year smoking history, T2DM, and HLD.  Right ICA and MCA stenosis visualized on MRA and CTA. Carotid dopplers with > 80% stenosis of right ICA.  - Neurology consulted, appreciate assistance - Vascular surgery consulted, will have CEA in 2 weeks.  - ASA 81mg  and plavix 75mg  daily for 3 months, then plavix alone per neurology - Will need aggressive risk factor modification - CIR consulted for placement  # Elevated Troponin. Troponin  1.82 > 4.17 > 3.71 > 3.07. No chest pain, SOB, or palpitations. Per cardiology, no evidence for ACS. Possibly due to rhabdomyolysis. Echo with normal EF, grade 1 diastolic dysfunction, mild LVH. No further work up at this time. Cardiology signed off. - Continue to monitor for signs of ACS  # HLD. Lipid panel here: Total cholesterol 317, TG 154, LDL 240. Not on any medications. 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 Also with HLD on lipid panel in 2015.  - Start atorvastatin 80mg  here.  # T2DM. A1c 8.5. Not on any home medications.  - SSI  # Hypertension: - Restarted lisinopril-HCTZ - Start back home metoprolol as indicated.   # Mild Rhabdomyolysis. CK 510 > 229. Likely  due to being down on floor for several hours prior to arrival.  - Continue to monitor  # Leukocytosis. WBC 18.9 on admission. Likely acute stress reaction. Improving. - Continue to monitor.   # Pulmonary Nodules. Bilateral pulmonary nodules found on CT incidentally. Concern for bronchiogenic carcinoma.  - Follow up in 6-12 months per radiology  # Rash. Blanchable papular rash on anterior, medial, and lateral aspects of thighs. Possibly irritation/abrasion from crawling on floor. - Continue to monitor  FEN/GI: Dysphagia 2 diet per SLP Prophylaxis: SubQ heparin OK per neuro  Disposition: Ready for discharge to SNF vs CIR when bed available.  Subjective:  Doing well this morning. Is able to move LUE some now. Still no sensation. No chest pain or shortness of breath.   Objective: Temp:  [97.9 F (36.6 C)-98.7 F (37.1 C)] 98.3 F (36.8 C) (05/02 0523) Pulse Rate:  [64-85] 64 (05/02 0523) Resp:  [16-20] 18 (05/02 0523) BP: (129-154)/(63-93) 129/63 mmHg (05/02 0523) SpO2:  [93 %-97 %] 94 % (05/02 0523) Physical Exam: General: 64 year old female lying in hospital bed in no acute distress HEENT: NCAT, MMM, xanthoma noted on left inferior eye lid,  Cardiovascular: RRR, 2/6 systolic murmur.  Respiratory: NWOB, Clear to auscultation bilaterally Abdomen: +BS, soft, NT, ND Extremities: No edema noted Neuro: Alert and conversational. Dysarthric speech. Left facial droop noted, LUE 1/5 strength. RUE with normal strength. Bilateral lower extremities with 5/5 strength. No sensation in LUE from elbow to hand. Sensation intact otherwise.   Laboratory/Imaging:  Recent Labs Lab 07/24/14 1452 07/24/14 1518 07/25/14 0250 07/26/14 0332  WBC 18.9*  --  15.5* 12.2*  HGB 16.1* 18.4* 16.0* 14.6  HCT 47.4* 54.0* 47.8* 44.7  PLT 319  --  331 304    Recent Labs Lab 07/24/14 1452 07/24/14 1518  NA 138 139  K 3.7 3.7  CL 103 103  CO2 24  --   BUN 18 22  CREATININE 0.88 0.80  CALCIUM  9.4  --   PROT 7.5  --   BILITOT 0.6  --   ALKPHOS 89  --   ALT 26  --   AST 42*  --   GLUCOSE 196* 197*    Recent Labs Lab 07/27/14 0626 07/27/14 1122 07/27/14 1614 07/27/14 2112 07/28/14 0624  GLUCAP 193* 180* [place], MD 07/28/2014, 8:00 AM PGY-1, [place] Intern pager: [phone], text pages welcome

## 2014-07-28 NOTE — Progress Notes (Signed)
Physical Therapy Treatment Patient Details Name: Zamaria Brazzle MRN: [id number] DOB: Apr 08, 1950 Today's Date: 07/28/2014    History of Present Illness Patient is a 64 y/o female admitted with left sided weakness postive for right MCA CVA.  PMH positive for HLD, HTN, DM and hypothyroidism.    PT Comments    Pt with severe L sided neglect, balance impairment, and  L UE/LE numbness, impaired proprioception and weakness requiring assist for safe transfers and ambulation. Pt to strongly benefit from CIR upon d/c to allow for maximal functional recovery for safe transition home with family.  Follow Up Recommendations  CIR     Equipment Recommendations       Recommendations for Other Services Rehab consult     Precautions / Restrictions Precautions Precautions: Fall Precaution Comments: severe L sided neglect/inattention Restrictions Weight Bearing Restrictions: No    Mobility  Bed Mobility               General bed mobility comments: Pt up in recliner upon my arrival  Transfers Overall transfer level: Needs assistance Equipment used: 1 person hand held assist Transfers: Sit to/from Stand Sit to Stand: Min assist         General transfer comment: pt automatically tries to turn to the R when getting up or sitting down requiring max directional v/c's and minA for safety so pt doesn't miss the chair. pt impulsive/quick to move also requiring min tactile cues for safety as well  Ambulation/Gait Ambulation/Gait assistance: Mod assist;+2 physical assistance;+2 safety/equipment Ambulation Distance (Feet): 100 Feet (x2) Assistive device: 1 person hand held assist (on Right, 1 person for chair follow, PT to assist with L LE) Gait Pattern/deviations: Step-through pattern;Decreased step length - left;Decreased stance time - left;Decreased dorsiflexion - left;Decreased weight shift to left Gait velocity: impulsively quick   General Gait Details: pt with noted L sided neglect, L  LE decreased DF, decreased step height/dragging foot, L knee buckling/limp. pt with no proprioceptive input to self correct. max v/c's to make L knee straight and pick up foot to clear floor. PT provided tactile cueing at buttocks to promote stability to allow for better L LE gait kinematics while rehab tech provide physical support on R side. pt unable to use RW due to inability to grip with L hand. pt also impulsive requiring max v/c's to slow down to normal pace   Stairs            Wheelchair Mobility    Modified Rankin (Stroke Patients Only) Modified Rankin (Stroke Patients Only) Pre-Morbid Rankin Score: No symptoms Modified Rankin: Moderately severe disability     Balance Overall balance assessment: Needs assistance         Standing balance support: Bilateral upper extremity supported Standing balance-Leahy Scale: Poor Standing balance comment: minA for dynamic activity                    Cognition Arousal/Alertness: Awake/alert Behavior During Therapy: Impulsive Overall Cognitive Status: Impaired/Different from baseline Area of Impairment: Attention;Safety/judgement;Awareness;Problem solving   Current Attention Level: Sustained Memory: Decreased short-term memory   Safety/Judgement: Decreased awareness of safety;Decreased awareness of deficits Awareness: Emergent Problem Solving: Slow processing;Difficulty sequencing;Requires verbal cues;Requires tactile cues General Comments: L sided neglect    Exercises Other Exercises Other Exercises: worked on increasing L UE and LE WBing to engage muscle groups for strengthening. had pt grab arm rests on chair in front of her and place R knee up to chair x 10. PT Tech assist with L  UE placement and stability due to inability to grip and PT focused on maintaining WS to the L during transition of R Leg up to chair Other Exercises: also spoke with pt and daughter regarding L sided neglect and instructed daughter to stand  on pt's left side to talk to help her be more aware of L side.    General Comments        Pertinent Vitals/Pain Pain Assessment: No/denies pain    Home Living                      Prior Function            PT Goals (current goals can now be found in the care plan section) Acute Rehab PT Goals Patient Stated Goal: rehab then home Progress towards PT goals: Progressing toward goals    Frequency  Min 4X/week    PT Plan Current plan remains appropriate    Co-evaluation             End of Session Equipment Utilized During Treatment: Gait belt Activity Tolerance: Patient tolerated treatment well Patient left: in chair;with call bell/phone within reach;with family/visitor present     Time: 0998-3382 PT Time Calculation (min) (ACUTE ONLY): 23 min  Charges:  $Gait Training: 8-22 mins $Neuromuscular Re-education: 8-22 mins                    G Codes:      Kingsley Callander 07/28/2014, 12:51 PM   Kittie Plater, PT, DPT Pager #: [phone] Office #: [phone]

## 2014-07-28 NOTE — Progress Notes (Signed)
Pt has home CPAP. Pt is able to put on self. Pt/family understands to call with questions/concerns. RT will monitor.

## 2014-07-28 NOTE — Consult Note (Signed)
Physical Medicine and Rehabilitation Consult Reason for Consult: Multifocal patchy acute right MCA territory infarct Referring Physician: Internal medicine   HPI: Carla Hopkins is a 64 y.o. right handed female with history of tobacco abuse, hypertension. Patient lives alone independently prior to admission and working full time. Admitted 07/24/2014 after a fall noting left-sided weakness. No head trauma no loss of consciousness. MRI of the brain shows multifocal patchy acute right MCA territory infarcts. Additional 8 mm cortical infarct within the parasagittal cortical gray matter of the anterior right frontal lobe, likely right ACA distribution. MRA of the head with right MCA M2 inferior division occlusion. CT angiogram chest negative except for a 6 mm pulmonary nodule on the right with recommendation follow-up CT the chest in 6-12 months. Echocardiogram with ejection fraction of 70% and grade 1 diastolic dysfunction. Carotid Doppler with left 50-60% and right 80% ICA stenosis. Patient did not receive TPA. Vascular surgery Dr. Oneida Alar consulted in relation to ICA stenosis plan follow-up as an outpatient to proceed with possible CEA Neurology consulted presently on aspirin and Plavix for CVA prophylaxis. Subcutaneous heparin for DVT prophylaxis. Dysphagia 2 nectar thick liquid diet. Physical therapy evaluation completed with recommendations of physical medicine rehabilitation consult.   Review of Systems  Respiratory: Positive for cough.        Occasional shortness of breath on exertion  All other systems reviewed and are negative.  Past Medical History  Diagnosis Date  . Hypothyroidism   . Hypertension    Past Surgical History  Procedure Laterality Date  . Tubal ligation     Family History  Problem Relation Age of Onset  . Adopted: Yes   Social History:  reports that she has been smoking Cigarettes.  She has been smoking about 20.00 packs per day. She does not have any smokeless  tobacco history on file. She reports that she drinks alcohol. She reports that she does not use illicit drugs. Allergies: No Known Allergies Medications Prior to Admission  Medication Sig Dispense Refill  . ergocalciferol (DRISDOL) 50000 UNITS capsule Take 1 capsule (50,000 Units total) by mouth once a week. 12 capsule 1  . ibuprofen (ADVIL,MOTRIN) 200 MG tablet Take 400 mg by mouth every 6 (six) hours as needed.    Marland Kitchen levothyroxine (SYNTHROID, LEVOTHROID) 88 MCG tablet Take 1 tablet (88 mcg total) by mouth daily. 90 tablet 1  . lisinopril-hydrochlorothiazide (ZESTORETIC) 20-12.5 MG per tablet Take 1 tablet by mouth daily. 90 tablet 1  . albuterol (PROVENTIL HFA;VENTOLIN HFA) 108 (90 BASE) MCG/ACT inhaler Inhale 2 puffs into the lungs every 4 (four) hours as needed for wheezing or shortness of breath (cough, shortness of breath or wheezing.). (Patient not taking: Reported on 07/03/2014) 1 Inhaler 12  . metoprolol succinate (TOPROL-XL) 25 MG 24 hr tablet Take 1/2 - 1 tablet by mouth once a day. 30 tablet 3    Home: Home Living Family/patient expects to be discharged to:: Inpatient rehab Living Arrangements: Alone (daughter lives in [place]) Type of Home: House Home Access: Stairs to enter Technical brewer of Steps: 3 Entrance Stairs-Rails: None Home Layout: One level Home Equipment: None  Lives With: Alone  Functional History: Prior Function Level of Independence: Independent Comments: works for [employer] Functional Status:  Mobility: Bed Mobility Overal bed mobility: Needs Assistance Bed Mobility: Supine to Sit Supine to sit: Mod assist, HOB elevated General bed mobility comments: Pt up in recliner upon my arrival Transfers Overall transfer level: Needs assistance Equipment used:  (  1 person Bil forearm A in front of her) Transfers: Sit to/from Stand Sit to Stand: Min assist Stand pivot transfers: Min assist General transfer comment: cues for technique, to slow down and  for proper positioning in chair prior to sitting       ADL: ADL Overall ADL's : Needs assistance/impaired Eating/Feeding: Set up, Supervision/ safety (swallowing precautions) Grooming: Moderate assistance, Sitting Upper Body Bathing: Moderate assistance, Sitting Lower Body Bathing: Maximal assistance (with Min A sit<>stand, but Mod A for dynamic balance) Upper Body Dressing : Moderate assistance, Sitting Lower Body Dressing: Total assistance (with Min A sit<>stand, but Mod A for dynamic balance) Toilet Transfer: Moderate assistance, Ambulation (Bil HHA with me infront of her holding onto gait belt with both UEs; recliner >bed>back to recliner with VC and tactile cues for shifting weight) Toileting- Clothing Manipulation and Hygiene: Maximal assistance (with Min A sit<>stand, but Mod A for dynamic balance)  Cognition: Cognition Overall Cognitive Status: Impaired/Different from baseline Arousal/Alertness: Awake/alert Orientation Level: Oriented X4 Attention: Sustained Sustained Attention: Appears intact Memory: Appears intact Awareness: Appears intact Problem Solving: Appears intact (see clinical impression statement) Safety/Judgment: Appears intact Cognition Arousal/Alertness: Awake/alert Behavior During Therapy: Impulsive (when getting ready to stand up and work on ambulation) Overall Cognitive Status: Impaired/Different from baseline Area of Impairment: Attention, Safety/judgement, Awareness, Problem solving Current Attention Level: Selective Safety/Judgement: Decreased awareness of safety (decreased awareness of how her deficits affect her safety) Awareness: Emergent Problem Solving: Difficulty sequencing, Requires verbal cues, Requires tactile cues (for shifting weight when taking steps, especially for LLE) General Comments: decreasd left side awareness/attention  Blood pressure 141/108, pulse 78, temperature 97.9 F (36.6 C), temperature source Oral, resp. rate 20, height  5' 2.5" (1.588 m), weight 95.4 kg (210 lb 5.1 oz), SpO2 95 %. Physical Exam  Vitals reviewed. Constitutional: She is oriented to person, place, and time.  HENT:  Head: Normocephalic.  Eyes: EOM are normal.  Neck: Normal range of motion. Neck supple. No thyromegaly present.  Cardiovascular: Normal rate and regular rhythm.   Respiratory: Effort normal and breath sounds normal. No respiratory distress.  GI: Soft. Bowel sounds are normal. She exhibits no distension.  Neurological: She is alert and oriented to person, place, and time.  Follows commands. ?mild left inattention, left facial droop. Speech dysarthric. Good insight and awareness. LUE: 1+ to 2- delt,bic,ticep, 0/5 hi,wrist. LLE: 3+ to 4/5 prox to disatl. Sensation 1/2 LUE--can sense general pain. LLE with minimal sensory deficits  Skin: Skin is warm and dry.  Psychiatric: She has a normal mood and affect. Her behavior is normal.    Results for orders placed or performed during the hospital encounter of 07/24/14 (from the past 24 hour(s))  Glucose, capillary     Status: Abnormal   Collection Time: 07/27/14  4:14 PM  Result Value Ref Range   Glucose-Capillary 286 (H) 70 - 99 mg/dL  Glucose, capillary     Status: Abnormal   Collection Time: 07/27/14  9:12 PM  Result Value Ref Range   Glucose-Capillary 111 (H) 70 - 99 mg/dL  Glucose, capillary     Status: Abnormal   Collection Time: 07/28/14  6:24 AM  Result Value Ref Range   Glucose-Capillary 177 (H) 70 - 99 mg/dL   Comment 1 Repeat Test    Comment 2 Document in Chart   Glucose, capillary     Status: Abnormal   Collection Time: 07/28/14 11:34 AM  Result Value Ref Range   Glucose-Capillary 183 (H) 70 - 99 mg/dL  Comment 1 Notify RN    Comment 2 Document in Chart    No results found.  Assessment/Plan: Diagnosis: right MCA infarct 1. Does the need for close, 24 hr/day medical supervision in concert with the patient's rehab needs make it unreasonable for this patient to be  served in a less intensive setting? Yes 2. Co-Morbidities requiring supervision/potential complications: dm, htn,  3. Due to bladder management, bowel management, safety, skin/wound care, disease management, medication administration, pain management and patient education, does the patient require 24 hr/day rehab nursing? Yes 4. Does the patient require coordinated care of a physician, rehab nurse, PT (1-2 hrs/day, 5 days/week), OT (1-2 hrs/day, 5 days/week) and SLP (1-2 hrs/day, 5 days/week) to address physical and functional deficits in the context of the above medical diagnosis(es)? Yes Addressing deficits in the following areas: balance, endurance, locomotion, strength, transferring, bowel/bladder control, bathing, dressing, feeding, grooming, toileting, cognition, speech, swallowing and psychosocial support 5. Can the patient actively participate in an intensive therapy program of at least 3 hrs of therapy per day at least 5 days per week? Yes 6. The potential for patient to make measurable gains while on inpatient rehab is excellent 7. Anticipated functional outcomes upon discharge from inpatient rehab are modified independent and supervision  with PT, modified independent and supervision with OT, modified independent with SLP. 8. Estimated rehab length of stay to reach the above functional goals is: 15-21 days 9. Does the patient have adequate social supports and living environment to accommodate these discharge functional goals? Yes 10. Anticipated D/C setting: Home 11. Anticipated post D/C treatments: HH therapy and Outpatient therapy 12. Overall Rehab/Functional Prognosis: excellent  RECOMMENDATIONS: This patient's condition is appropriate for continued rehabilitative care in the following setting: CIR Patient has agreed to participate in recommended program. Yes Note that insurance prior authorization may be required for reimbursement for recommended care.  Comment: Rehab Admissions  Coordinator to follow up.  Thanks,  Meredith Staggers, MD, Mellody Drown     07/28/2014

## 2014-07-29 LAB — GLUCOSE, CAPILLARY
GLUCOSE-CAPILLARY: 162 mg/dL — AB (ref 70–99)
Glucose-Capillary: 178 mg/dL — ABNORMAL HIGH (ref 70–99)
Glucose-Capillary: 137 mg/dL — ABNORMAL HIGH (ref 70–99)
Glucose-Capillary: 137 mg/dL — ABNORMAL HIGH (ref 70–99)

## 2014-07-29 MED ORDER — METFORMIN HCL 500 MG PO TABS: 500.0000 mg | ORAL_TABLET | Freq: Every day | ORAL | Status: DC

## 2014-07-29 MED ORDER — METFORMIN HCL 500 MG PO TABS
500.0000 mg | ORAL_TABLET | Freq: Two times a day (BID) | ORAL | Status: DC
  Administered 2014-07-29 – 2014-07-30 (×2): 500 mg via ORAL
  Filled 2014-07-29 (×2): qty 1

## 2014-07-29 NOTE — Progress Notes (Signed)
Patient c/o of difficulty swallowing nectar thick liquid. Per daughter, she appears with no problem swallowing thin liquids (coffee without thickening given by dgt at bedside). Speech eval order. Spoke to Dr. Andria Frames.   Ave Filter, RN

## 2014-07-29 NOTE — Progress Notes (Signed)
Physical Therapy Treatment Patient Details Name: Carla Hopkins MRN: [id number] DOB: [birth year] Today's Date: 07/29/2014    History of Present Illness Patient is a 64 y/o female admitted with left sided weakness postive for right MCA CVA.  PMH positive for HLD, HTN, DM and hypothyroidism.    PT Comments    Pt con't to have severe L sided neglect requiring max verbal and tactile cues to acknowledge. Pt con't to have L UE and LE weakness and poor proprioception requiring modA to optimize gait kinematics and upright, mid line position in standing. Pt con't to be an excellent candidate for CIR upon d/c to maximize functional return for safe transition home with family.   Follow Up Recommendations  CIR     Equipment Recommendations       Recommendations for Other Services Rehab consult     Precautions / Restrictions Precautions Precautions: Fall Precaution Comments: severe L sided neglect Restrictions Weight Bearing Restrictions: No    Mobility  Bed Mobility               General bed mobility comments: pt up in chair  Transfers Overall transfer level: Needs assistance Equipment used: 1 person hand held assist Transfers: Sit to/from Stand Sit to Stand: Min assist         General transfer comment: pt con't to require verbal and tactile cues for safety due to inattention on L side  Ambulation/Gait Ambulation/Gait assistance: Mod assist;+2 physical assistance Ambulation Distance (Feet): 100 Feet Assistive device: 1 person hand held assist   Gait velocity: impulsively quick   General Gait Details: pt with noted L sided neglect, L LE decreased DF, decreased step height/dragging foot, L knee buckling/limp. pt with no proprioceptive input to self correct. max v/c's to make L knee straight and pick up foot to clear floor. PT provided tactile cueing at buttocks to promote stability to allow for better L LE gait kinematics while rehab tech provide physical support on R side.  pt unable to use RW due to inability to grip with L hand. pt also impulsive requiring max v/c's to slow down to normal pace   Stairs            Wheelchair Mobility    Modified Rankin (Stroke Patients Only) Modified Rankin (Stroke Patients Only) Pre-Morbid Rankin Score: No symptoms Modified Rankin: Moderately severe disability     Balance Overall balance assessment: Needs assistance         Standing balance support: Bilateral upper extremity supported Standing balance-Leahy Scale: Poor Standing balance comment: worked on standing balance in front of mirror. Pt unable to self correct without verbal and tactile cues.                    Cognition Arousal/Alertness: Awake/alert Behavior During Therapy: Impulsive Overall Cognitive Status: Impaired/Different from baseline Area of Impairment: Attention;Safety/judgement;Awareness;Problem solving   Current Attention Level: Sustained Memory: Decreased short-term memory   Safety/Judgement: Decreased awareness of safety;Decreased awareness of deficits Awareness: Emergent Problem Solving: Slow processing;Difficulty sequencing;Requires verbal cues;Requires tactile cues General Comments: L sided neglect    Exercises Other Exercises Other Exercises: worked on mini squats with increased L LE WBING to promote strengthening and proprioception. 2 people required for optimal position and to maintain L UE on railing due to decreased grip strength and sensation Other Exercises: worked on maintained mid line position in Radio broadcast assistant. pt would lean L with noted L knee flexion hwoever unable to self correct    General Comments  Pertinent Vitals/Pain Pain Assessment: No/denies pain    Home Living                      Prior Function            PT Goals (current goals can now be found in the care plan section) Progress towards PT goals: Progressing toward goals    Frequency  Min 4X/week    PT Plan  Current plan remains appropriate    Co-evaluation             End of Session Equipment Utilized During Treatment: Gait belt Activity Tolerance: Patient tolerated treatment well Patient left: in chair;with call bell/phone within reach;with family/visitor present     Time: 1130-1158 PT Time Calculation (min) (ACUTE ONLY): 28 min  Charges:  $Gait Training: 8-22 mins $Neuromuscular Re-education: 8-22 mins                    G Codes:      Carla Hopkins 07/29/2014, 2:59 PM  Kittie Plater, PT, DPT Pager #: [phone] Office #: [phone]

## 2014-07-29 NOTE — Progress Notes (Signed)
Family Medicine Teaching Service Daily Progress Note Intern Pager: [phone]  Patient name: Carla Hopkins Medical record number: [id number] Date of birth: 12/04/1950 Age: 64 y.o. Gender: female  Primary Care Provider: Ellsworth Lennox, MD Consultants: Neurology, Cardiology, Vascular Code Status: Full  Assessment and Plan: 64 y.o. female presenting with acute left sided weakness found to have a right MCA CVA on CT. PMH is significant for HTN, Hypothyroidism, and T2DM. Also with elevated troponin, and incidentally found pulmonary nodules here.   # Right MCA CVA. Deficits include flaccid paralysis of LUE, left facial droop, and dysarthric speech. CT and MRI consistent with acute right MCA infarcts and minimal petechial hemorrhage. Not a tPA candidate. Risk factors include HTN, approximately 50 pack-year smoking history, T2DM, and HLD.  Right ICA and MCA stenosis visualized on MRA and CTA. Carotid dopplers with > 80% stenosis of right ICA.  - Neurology consulted, appreciate assistance - Vascular surgery consulted, will have CEA in 2 weeks.  - ASA 81mg  and plavix 75mg  daily for 3 months, then plavix alone per neurology (ok to continue prior to CEA, per vascular) - Will need aggressive risk factor modification - CIR consulted for placement  # Elevated Troponin. Troponin  1.82 > 4.17 > 3.71 > 3.07. No chest pain, SOB, or palpitations. Per cardiology, no evidence for ACS. Possibly due to rhabdomyolysis. Echo with normal EF, grade 1 diastolic dysfunction, mild LVH. No further work up at this time. Cardiology signed off. - Continue to monitor for signs of ACS  # HLD. Lipid panel here: Total cholesterol 317, TG 154, LDL 240. Not on any medications. 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 Also with HLD on lipid panel in 2015.  - Start atorvastatin 80mg  here.  # T2DM. A1c 8.5. Not on any home medications.  - SSI - Will start metformin while here  # Hypertension: - Restarted lisinopril-HCTZ - Start  back home metoprolol as indicated.   # Mild Rhabdomyolysis. CK 510 > 229. Likely due to being down on floor for several hours prior to arrival.  - Continue to monitor  # Leukocytosis. WBC 18.9 on admission. Likely acute stress reaction. Improving. - Continue to monitor.   # Pulmonary Nodules. Bilateral pulmonary nodules found on CT incidentally. Concern for bronchiogenic carcinoma.  - Follow up in 6-12 months per radiology  # Rash. Blanchable papular rash on anterior, medial, and lateral aspects of thighs. Possibly irritation/abrasion from crawling on floor. - Continue to monitor  FEN/GI: Dysphagia 2 diet per SLP Prophylaxis: SubQ heparin OK per neuro  Disposition: Ready for discharge to SNF vs CIR when bed available.  Subjective:  Doing well this morning. No complaints.   Objective: Temp:  [97.3 F (36.3 C)-98.6 F (37 C)] 97.3 F (36.3 C) (05/03 0516) Pulse Rate:  [68-85] 68 (05/03 0516) Resp:  [17-20] 20 (05/03 0516) BP: (96-141)/(57-108) 96/59 mmHg (05/03 0516) SpO2:  [95 %-100 %] 97 % (05/03 0516) Physical Exam: General: 64 year old female lying in hospital bed in no acute distress HEENT: NCAT, MMM, xanthoma noted on left inferior eye lid,  Cardiovascular: RRR, 2/6 systolic murmur.  Respiratory: NWOB, Clear to auscultation bilaterally Abdomen: +BS, soft, NT, ND Extremities: No edema noted Neuro: Alert and conversational. Dysarthric speech. Left facial droop noted, LUE 1/5 strength. RUE with normal strength. Bilateral lower extremities with 5/5 strength. No sensation in LUE from elbow to hand. Sensation intact otherwise.   Laboratory/Imaging:   Recent Labs Lab 07/28/14 0624 07/28/14 1134 07/28/14 1638 07/28/14 2137 07/29/14 0735  GLUCAP 177*  [place], MD 07/29/2014, 8:29 AM PGY-1, [place] Intern pager: [phone], text pages welcome

## 2014-07-29 NOTE — Clinical Social Work Note (Signed)
CSW will update the pt and her daughter [name] about 6 SNF bed offers. CSW will continue to follow.   [place], MSW, [place]

## 2014-07-29 NOTE — Progress Notes (Signed)
Pt daughter is very concerned about her mother's "mental state".  She is concerned that she is depressed and vocalized her wishes to have a psych evaluation prior to discharge from the hospital/CIR. She is convinced that she will not seek any help once discharged back home.  RN notified.

## 2014-07-29 NOTE — Progress Notes (Signed)
Patient placed herself on home CPAP machine

## 2014-07-29 NOTE — Progress Notes (Signed)
Rehab admissions - I met with pt in follow up to rehab MD consult to explain the possibility of inpatient rehab. Further questions were answered and informational brochures were given. Pt is interested in pursuing inpatient rehab. Pt's dtr is here visiting from [place], but dtr was out of pt's room during my visit. Pt will update her dtr.  Pt has BCBS of [place] and we will need insurance authorization to consider possible inpatient rehab admit. I have opened her case with insurance. In addition, we may have limited bed availability in the next few days.  I will keep the pt/family and medical team updated as I wait to hear back from insurance.  Please call me with any questions. Thanks.  Nanetta Batty, PT Rehabilitation Admissions Coordinator [phone]

## 2014-07-30 ENCOUNTER — Inpatient Hospital Stay (HOSPITAL_COMMUNITY)
Admission: RE | Admit: 2014-07-30 | Discharge: 2014-08-05 | DRG: 057 | Disposition: A | Discharge status: Other Acute Inpatient Hospital | Payer: BLUE CROSS/BLUE SHIELD | Source: Intra-hospital | Attending: Physical Medicine & Rehabilitation | Admitting: Physical Medicine & Rehabilitation

## 2014-07-30 DIAGNOSIS — E039 Hypothyroidism, unspecified: Secondary | ICD-10-CM

## 2014-07-30 DIAGNOSIS — I10 Essential (primary) hypertension: Secondary | ICD-10-CM | POA: Diagnosis present

## 2014-07-30 DIAGNOSIS — I63311 Cerebral infarction due to thrombosis of right middle cerebral artery: Secondary | ICD-10-CM | POA: Diagnosis present

## 2014-07-30 DIAGNOSIS — I69354 Hemiplegia and hemiparesis following cerebral infarction affecting left non-dominant side: Principal | ICD-10-CM

## 2014-07-30 DIAGNOSIS — R1310 Dysphagia, unspecified: Secondary | ICD-10-CM

## 2014-07-30 DIAGNOSIS — I6521 Occlusion and stenosis of right carotid artery: Secondary | ICD-10-CM

## 2014-07-30 DIAGNOSIS — Z87891 Personal history of nicotine dependence: Secondary | ICD-10-CM | POA: Diagnosis not present

## 2014-07-30 DIAGNOSIS — R414 Neurologic neglect syndrome: Secondary | ICD-10-CM | POA: Diagnosis not present

## 2014-07-30 DIAGNOSIS — I69391 Dysphagia following cerebral infarction: Secondary | ICD-10-CM | POA: Diagnosis not present

## 2014-07-30 DIAGNOSIS — I63511 Cerebral infarction due to unspecified occlusion or stenosis of right middle cerebral artery: Secondary | ICD-10-CM | POA: Insufficient documentation

## 2014-07-30 DIAGNOSIS — M6289 Other specified disorders of muscle: Secondary | ICD-10-CM | POA: Diagnosis not present

## 2014-07-30 DIAGNOSIS — R531 Weakness: Secondary | ICD-10-CM | POA: Diagnosis present

## 2014-07-30 DIAGNOSIS — E785 Hyperlipidemia, unspecified: Secondary | ICD-10-CM | POA: Diagnosis not present

## 2014-07-30 DIAGNOSIS — R911 Solitary pulmonary nodule: Secondary | ICD-10-CM | POA: Diagnosis not present

## 2014-07-30 DIAGNOSIS — E119 Type 2 diabetes mellitus without complications: Secondary | ICD-10-CM | POA: Diagnosis not present

## 2014-07-30 LAB — CBC
HCT: 45.9 % (ref 36.0–46.0)
Hemoglobin: 15.4 g/dL — ABNORMAL HIGH (ref 12.0–15.0)
MCH: 28.9 pg (ref 26.0–34.0)
MCHC: 33.6 g/dL (ref 30.0–36.0)
MCV: 86.3 fL (ref 78.0–100.0)
PLATELETS: 291 10*3/uL (ref 150–400)
RBC: 5.32 MIL/uL — AB (ref 3.87–5.11)
RDW: 15.5 % (ref 11.5–15.5)
WBC: 13.1 10*3/uL — ABNORMAL HIGH (ref 4.0–10.5)

## 2014-07-30 LAB — GLUCOSE, CAPILLARY
GLUCOSE-CAPILLARY: 140 mg/dL — AB (ref 70–99)
GLUCOSE-CAPILLARY: 134 mg/dL — AB (ref 70–99)
Glucose-Capillary: 185 mg/dL — ABNORMAL HIGH (ref 70–99)
Glucose-Capillary: 149 mg/dL — ABNORMAL HIGH (ref 70–99)

## 2014-07-30 LAB — CREATININE, SERUM
Creatinine, Ser: 0.92 mg/dL (ref 0.44–1.00)
GFR calc non Af Amer: >60 mL/min (ref >60)

## 2014-07-30 MED ORDER — ONDANSETRON HCL 4 MG/2ML IJ SOLN: 4.0000 mg | Freq: Four times a day (QID) | INTRAMUSCULAR | Status: DC | PRN

## 2014-07-30 MED ORDER — ATORVASTATIN CALCIUM 80 MG PO TABS
80.0000 mg | ORAL_TABLET | Freq: Every day | ORAL | Status: DC
  Administered 2014-07-30 – 2014-08-04 (×6): 80 mg via ORAL
  Filled 2014-07-30 (×7): qty 1

## 2014-07-30 MED ORDER — INSULIN ASPART 100 UNIT/ML ~~LOC~~ SOLN: 0.0000 [IU] | Freq: Every day | SUBCUTANEOUS | Status: DC

## 2014-07-30 MED ORDER — SORBITOL 70 % SOLN: 30.0000 mL | Freq: Every day | Status: DC | PRN

## 2014-07-30 MED ORDER — CLOPIDOGREL BISULFATE 75 MG PO TABS
75.0000 mg | ORAL_TABLET | Freq: Every day | ORAL | Status: DC
  Administered 2014-07-31 – 2014-08-04 (×5): 75 mg via ORAL
  Filled 2014-07-30 (×7): qty 1

## 2014-07-30 MED ORDER — HYDROCHLOROTHIAZIDE 12.5 MG PO CAPS
12.5000 mg | ORAL_CAPSULE | Freq: Every day | ORAL | Status: DC
  Administered 2014-07-31 – 2014-08-04 (×5): 12.5 mg via ORAL
  Filled 2014-07-30 (×7): qty 1

## 2014-07-30 MED ORDER — METFORMIN HCL 500 MG PO TABS
500.0000 mg | ORAL_TABLET | Freq: Two times a day (BID) | ORAL | Status: DC
  Administered 2014-07-30 – 2014-08-04 (×11): 500 mg via ORAL
  Filled 2014-07-30 (×14): qty 1

## 2014-07-30 MED ORDER — ASPIRIN 81 MG PO CHEW
81.0000 mg | CHEWABLE_TABLET | Freq: Every day | ORAL | Status: DC
  Administered 2014-07-31 – 2014-08-04 (×5): 81 mg via ORAL
  Filled 2014-07-30 (×5): qty 1

## 2014-07-30 MED ORDER — ONDANSETRON HCL 4 MG PO TABS: 4.0000 mg | ORAL_TABLET | Freq: Four times a day (QID) | ORAL | Status: DC | PRN

## 2014-07-30 MED ORDER — ACETAMINOPHEN 325 MG PO TABS: 325.0000 mg | ORAL_TABLET | ORAL | Status: DC | PRN

## 2014-07-30 MED ORDER — RESOURCE THICKENUP CLEAR PO POWD
ORAL | Status: DC | PRN
  Filled 2014-07-30: qty 125

## 2014-07-30 MED ORDER — SENNOSIDES-DOCUSATE SODIUM 8.6-50 MG PO TABS: 1.0000 | ORAL_TABLET | Freq: Every evening | ORAL | Status: DC | PRN

## 2014-07-30 MED ORDER — LISINOPRIL 5 MG PO TABS: 5.0000 mg | ORAL_TABLET | Freq: Every day | ORAL | Status: DC

## 2014-07-30 MED ORDER — HEPARIN SODIUM (PORCINE) 5000 UNIT/ML IJ SOLN
5000.0000 [IU] | Freq: Three times a day (TID) | INTRAMUSCULAR | Status: DC
  Administered 2014-07-30 – 2014-08-05 (×17): 5000 [IU] via SUBCUTANEOUS
  Filled 2014-07-30 (×23): qty 1

## 2014-07-30 MED ORDER — PNEUMOCOCCAL VAC POLYVALENT 25 MCG/0.5ML IJ INJ
0.5000 mL | INJECTION | INTRAMUSCULAR | Status: AC
  Administered 2014-07-31: 0.5 mL via INTRAMUSCULAR
  Filled 2014-07-30: qty 0.5

## 2014-07-30 MED ORDER — SALINE SPRAY 0.65 % NA SOLN: 1.0000 | NASAL | Status: DC | PRN

## 2014-07-30 MED ORDER — HEPARIN SODIUM (PORCINE) 5000 UNIT/ML IJ SOLN: 5000.0000 [IU] | Freq: Three times a day (TID) | INTRAMUSCULAR | Status: DC

## 2014-07-30 MED ORDER — LEVOTHYROXINE SODIUM 88 MCG PO TABS
88.0000 ug | ORAL_TABLET | Freq: Every day | ORAL | Status: DC
  Administered 2014-07-31 – 2014-08-05 (×6): 88 ug via ORAL
  Filled 2014-07-30 (×7): qty 1

## 2014-07-30 MED ORDER — STARCH (THICKENING) PO POWD: ORAL | Status: DC | PRN

## 2014-07-30 MED ORDER — LISINOPRIL 5 MG PO TABS
5.0000 mg | ORAL_TABLET | Freq: Every day | ORAL | Status: DC
  Administered 2014-07-31 – 2014-08-04 (×4): 5 mg via ORAL
  Filled 2014-07-30 (×7): qty 1

## 2014-07-30 MED ORDER — METFORMIN HCL 500 MG PO TABS: 500.0000 mg | ORAL_TABLET | Freq: Two times a day (BID) | ORAL | Status: DC

## 2014-07-30 NOTE — PMR Pre-admission (Signed)
PMR Admission Coordinator Pre-Admission Assessment  Patient: Carla Hopkins is an 64 y.o., female MRN: [id number] DOB: 1951-03-07 Height: 5' 2.5" (158.8 cm) Weight: 95.4 kg (210 lb 5.1 oz)              Insurance Information HMO:     PPO:      PCP:      IPA:      80/20:      OTHER: [employer] PRIMARY: BCBS of [place]      Policy#: [id number]      Subscriber: self CM Name: Delight Stare, RN      Phone#: [phone], ext. [phone]     Fax#: [phone] Verbal approval given on 07-30-14 for seven days from 07-30-14 through 08-06-14 with updates due to [facility] on 08-06-14 at above fax  Pre-Cert#: [id number]      Employer: [employer] Benefits:  Phone #: [phone]     Name: per [place]. Date: 05-27-14     Deduct: $2000 (met $325)      Out of Pocket Max: $5350 (met $325)      Life Max: none CIR: 80/20%, pre-auth needed      SNF: 80/20% (120 days visit limits) Outpatient: 80/20%     Co-Pay:  no copay, no visit limits Home Health: 80/20%      Co-Pay: none, 100 visit limits DME: 80/20%     Co-Pay: none Providers: in network  Emergency Contact Information Contact Information    Name Relation Home Work Mobile   [place],Courtney Daughter [place]   [phone]   [facility] Neighbor   [phone]   Antoon,Nancy Neighbor   [phone]     Current Medical History  Patient Admitting Diagnosis: Multifocal patchy acute right MCA territory infarct  History of Present Illness: Carla Hopkins is a 64 y.o. right handed female with history of tobacco abuse, hypertension. Patient lives alone independently prior to admission and working full time. Admitted 07/24/2014 after a fall noting left-sided weakness. No head trauma no loss of consciousness. MRI of the brain shows multifocal patchy acute right MCA territory infarcts. Additional 8 mm cortical infarct within the parasagittal cortical gray matter of the anterior right frontal lobe, likely right ACA distribution. MRA of the  head with right MCA M2 inferior division occlusion. CT angiogram chest negative except for a 6 mm pulmonary nodule on the right with recommendation follow-up CT the chest in 6-12 months. Echocardiogram with ejection fraction of 70% and grade 1 diastolic dysfunction. Carotid Doppler with left 50-60% and right 80% ICA stenosis. Patient did not receive TPA. Vascular surgery Dr. Oneida Alar consulted in relation to ICA stenosis plan follow-up as an outpatient to proceed with possible CEA Neurology consulted presently on aspirin and Plavix for CVA prophylaxis. Subcutaneous heparin for DVT prophylaxis. Dysphagia 2 nectar thick liquid diet. Physical therapy evaluation completed with recommendations of physical medicine rehabilitation consult.  NIH Total: 9  Past Medical History  Past Medical History  Diagnosis Date  . Hypothyroidism   . Hypertension     Family History  family history is not on file. She was adopted.  Prior Rehab/Hospitalizations: none   Current Medications   Current facility-administered medications:  .  antiseptic oral rinse (CPC / CETYLPYRIDINIUM CHLORIDE 0.05%) solution 7 mL, 7 mL, Mouth Rinse, BID, Kinnie Feil, MD, 7 mL at 07/28/14 2139 .  aspirin chewable tablet 81 mg, 81 mg, Oral, Daily, Vivi Barrack, MD, 81 mg at 07/30/14 1005 .  atorvastatin (LIPITOR)  tablet 80 mg, 80 mg, Oral, q1800, Vivi Barrack, MD, 80 mg at 07/29/14 1748 .  clopidogrel (PLAVIX) tablet 75 mg, 75 mg, Oral, Daily, Vivi Barrack, MD, 75 mg at 07/30/14 1005 .  food thickener (THICK IT) powder, , Oral, PRN, Lind Covert, MD .  heparin injection 5,000 Units, 5,000 Units, Subcutaneous, 3 times per day, Vivi Barrack, MD, 5,000 Units at 07/30/14 9417 .  hydrochlorothiazide (MICROZIDE) capsule 12.5 mg, 12.5 mg, Oral, Daily, Lind Covert, MD, 12.5 mg at 07/30/14 1005 .  insulin aspart (novoLOG) injection 0-5 Units, 0-5 Units, Subcutaneous, QHS, Vivi Barrack, MD, 0 Units at 07/25/14 2200 .   insulin aspart (novoLOG) injection 0-9 Units, 0-9 Units, Subcutaneous, TID WC, Vivi Barrack, MD, 2 Units at 07/30/14 [phone] .  levothyroxine (SYNTHROID, LEVOTHROID) tablet 88 mcg, 88 mcg, Oral, QAC breakfast, Leone Haven, MD, 88 mcg at 07/30/14 [id number] .  lisinopril (PRINIVIL,ZESTRIL) tablet 5 mg, 5 mg, Oral, Daily, Zenia Resides, MD, 5 mg at 07/30/14 1005 .  metFORMIN (GLUCOPHAGE) tablet 500 mg, 500 mg, Oral, BID WC, Vivi Barrack, MD, 500 mg at 07/30/14 0814 .  senna-docusate (Senokot-S) tablet 1 tablet, 1 tablet, Oral, QHS PRN, Leone Haven, MD .  sodium chloride (OCEAN) 0.65 % nasal spray 1 spray, 1 spray, Each Nare, PRN, Zenia Resides, MD, 1 spray at 07/27/14 2118  Patients Current Diet: Diet regular Room service appropriate?: Yes; Fluid consistency:: Thin, full supervision with meals. Follow aspiration precautions at [facility]. Oral care after meals (clear any residue before reclining in bed or chair). Meds whole in applesauce.   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 She enjoys her four cats and has very supportive neighbors.   Home Assistive Devices / Equipment Home Assistive Devices/Equipment: Eyeglasses, CPAP Home Equipment: None  Prior Functional Level Prior Function Level of Independence: Independent Comments: works for [employer]  Current Functional Level Cognition  Arousal/Alertness: Awake/alert Overall Cognitive Status: Impaired/Different from baseline Current Attention Level: Sustained Orientation Level: Oriented X4 Safety/Judgement: Decreased awareness of safety, Decreased awareness of deficits General Comments: L sided neglect Attention: Sustained Sustained Attention: Appears  intact Memory: Appears intact Awareness: Appears intact Problem Solving: Appears intact (see clinical impression statement) Safety/Judgment: Appears intact    Extremity Assessment (includes Sensation/Coordination)  Upper Extremity Assessment: LUE deficits/detail LUE Deficits / Details: Brunstrum 0 forearm and distally; Brunstrum 1-elbow and proximally LUE Sensation: decreased light touch, decreased proprioception (can feel pain (pinch), but still not as strong as is on right arm--and pt is aware of this) LUE Coordination: decreased fine motor, decreased gross motor  Lower Extremity Assessment: LLE deficits/detail, RLE deficits/detail RLE Deficits / Details: AROM WFL, strength hip flexion 4-/5 due to pain, knee extension 4/5, ankle DF 4+/5 LLE Deficits / Details: AAROM WFL, strength hip flexion 3/5, knee extension 4-/5 ankle DF 3+/5    ADLs  Overall ADL's : Needs assistance/impaired Eating/Feeding: Set up, Supervision/ safety (swallowing precautions) Grooming: Moderate assistance, Sitting Upper Body Bathing: Moderate assistance, Sitting Lower Body Bathing: Maximal assistance (with Min A sit<>stand, but Mod A for dynamic balance) Upper Body Dressing : Moderate assistance, Sitting Lower Body Dressing: Total assistance (with Min A sit<>stand, but Mod A for dynamic balance) Toilet Transfer: Moderate assistance, Ambulation (Bil HHA with me infront  of her holding onto gait belt with both UEs; recliner >bed>back to recliner with VC and tactile cues for shifting weight) Toileting- Clothing Manipulation and Hygiene: Maximal assistance (with Min A sit<>stand, but Mod A for dynamic balance)    Mobility  Overal bed mobility: Needs Assistance Bed Mobility: Supine to Sit Supine to sit: Mod assist, HOB elevated General bed mobility comments: pt up in chair    Transfers  Overall transfer level: Needs assistance Equipment used: 1 person hand held assist Transfers: Sit to/from Stand Sit to Stand:  Min assist Stand pivot transfers: Min assist General transfer comment: pt con't to require verbal and tactile cues for safety due to inattention on L side    Ambulation / Gait / Stairs / Wheelchair Mobility  Ambulation/Gait Ambulation/Gait assistance: Mod assist, +2 physical assistance Ambulation Distance (Feet): 100 Feet Assistive device: 1 person hand held assist General Gait Details: pt with noted L sided neglect, L LE decreased DF, decreased step height/dragging foot, L knee buckling/limp. pt with no proprioceptive input to self correct. max v/c's to make L knee straight and pick up foot to clear floor. PT provided tactile cueing at buttocks to promote stability to allow for better L LE gait kinematics while rehab tech provide physical support on R side. pt unable to use RW due to inability to grip with L hand. pt also impulsive requiring max v/c's to slow down to normal pace Gait Pattern/deviations: Step-through pattern, Decreased step length - left, Decreased stance time - left, Decreased dorsiflexion - left, Decreased weight shift to left Gait velocity: impulsively quick    Posture / Balance Dynamic Sitting Balance Sitting balance - Comments: accepts challenges, but reaching to left minguard for safety when outside base of support Balance Overall balance assessment: Needs assistance Sitting-balance support: No upper extremity supported, Feet supported Sitting balance-Leahy Scale: Good Sitting balance - Comments: accepts challenges, but reaching to left minguard for safety when outside base of support Standing balance support: Bilateral upper extremity supported Standing balance-Leahy Scale: Poor Standing balance comment: worked on standing balance in front of mirror. Pt unable to self correct without verbal and tactile cues.    Special needs/care consideration BiPAP/CPAP - yes, uses CPAP at home CPM no Continuous Drip IV no  Dialysis no          Life Vest no  Oxygen no  Special  Bed no  Trach Size no  Wound Vac (area) no       Skin - pt has a rash on bilateral heels and bruise on L foot per pt report                             Bowel mgmt: last BM on 07-25-14 Bladder mgmt: currently using bedpan  Diabetic mgmt - newly diabetic, using insulin   Previous Home Environment Living Arrangements: Alone (daughter lives in [place])  Lives With: Alone Type of Home: House Home Layout: One level Home Access: Stairs to enter Entrance Stairs-Rails: None Entrance Stairs-Number of Steps: 3 Home Care Services: No  Discharge Living Setting Plans for Discharge Living Setting: Patient's home, Alone Type of Home at Discharge: House Discharge Home Layout: One level Discharge Home Access: Stairs to enter Entrance Stairs-Rails:  (hand rail is being built per pt) Entrance Stairs-Number of Steps: 3 Does the patient have any problems obtaining your medications?: No  Social/Family/Support Systems Patient Roles: Other (Comment) (works full time at [employer]) [place]: dtr [name]  is primary contact Anticipated Caregiver: Dtr [name] may return from [place] for a few days. In addition, pt's neighbors are very supportive. Anticipated Caregiver's Contact Information: see above Ability/Limitations of Caregiver: Dtr lives in [place] but may return for a few days when pt transitions to home. In addition, pt has several supportive neighbors. Caregiver Availability: Intermittent (Possible 24-7 supervision if dtr returns/from neighbors) Discharge Plan Discussed with Primary Caregiver: Yes Is Caregiver In Agreement with Plan?: Yes Does Caregiver/Family have Issues with Lodging/Transportation while Pt is in Rehab?: No   Note: pt's dtr lives in [place] and is here short term, returning to [place] soon. Dtr may be able to return when pt is transitioned to home. Dtr shared her concerns over pt's eventual need to return to work at [employer] in regards to financial needs and pt's  FMLA.  Goals/Additional Needs Patient/Family Goal for Rehab: Mod Ind and Supervision with PT/OT; Mod Ind with SLP Expected length of stay: 15-21 days Cultural Considerations: none Dietary Needs: Dys 2, nectar thick. Full supervision with meals, follow aspiration precautions at [facility]. Oral care after meals (clear any residue before reclining in bed or chair). Meds whole in applesauce. Equipment Needs: to be determined Pt/Family Agrees to Admission and willing to participate: Yes (spoke with pt and her dtr) Program Orientation Provided & Reviewed with Pt/Caregiver Including Roles  & Responsibilities: Yes   Decrease burden of Care through IP rehab admission: NA   Possible need for SNF placement upon discharge: not anticipated  Patient Condition: This patient's condition remains as documented in the consult dated 07-29-14, in which the Rehabilitation Physician determined and documented that the patient's condition is appropriate for intensive rehabilitative care in an inpatient rehabilitation facility. Will admit to inpatient rehab today.  Preadmission Screen Completed By:  Nanetta Batty, PT, 07/30/2014 11:26 AM ______________________________________________________________________   Discussed status with Dr. Naaman Plummer on 07-30-14 at 1126 and received telephone approval for admission today.  Admission Coordinator:  Nanetta Batty, PT, time 1126/Date 07-30-14

## 2014-07-30 NOTE — H&P (Signed)
Physical Medicine and Rehabilitation Admission H&P    Chief Complaint  Patient presents with  . Cerebrovascular Accident  : HPI: Carla Hopkins is a 64 y.o. right handed female with history of tobacco abuse, hypertension. Patient lives alone independently prior to admission and working full time. Admitted 07/24/2014 after a fall noting left-sided weakness. No head trauma no loss of consciousness. MRI of the brain shows multifocal patchy acute right MCA territory infarcts. Additional 8 mm cortical infarct within the parasagittal cortical gray matter of the anterior right frontal lobe, likely right ACA distribution. MRA of the head with right MCA M2 inferior division occlusion. CT angiogram chest negative except for a 6 mm pulmonary nodule on the right with recommendation follow-up CT the chest in 6-12 months. Elevated troponin 4.17. Echocardiogram with ejection fraction of 70% and grade 1 diastolic dysfunction. Carotid Doppler with left 50-60% and right 80% ICA stenosis. Patient did not receive TPA. Vascular surgery Dr. Oneida Alar consulted in relation to ICA stenosis plan follow-up as an outpatient to proceed with possible CEA. Follow-up cardiology for elevated troponin not felt to be due to ACS per Dr. Burt Knack and no further workup was indicated. Neurology consulted presently on aspirin and Plavix for CVA prophylaxis for 3 months then Plavix alone. Subcutaneous heparin for DVT prophylaxis. Findings of elevated hemoglobin A1c of 8.5 and placed on Glucophage with sliding scale insulin. Dysphagia 2 nectar thick liquid diet with follow per speech therapy. Physica and occupational l therapy evaluations completed with recommendations of physical medicine rehabilitation consult. Patient was admitted for a comprehensive rehabilitation program  ROS Review of Systems  Respiratory: Positive for cough.   Occasional shortness of breath on exertion  All other systems reviewed and are negative   Past Medical  History  Diagnosis Date  . Hypothyroidism   . Hypertension    Past Surgical History  Procedure Laterality Date  . Tubal ligation     Family History  Problem Relation Age of Onset  . Adopted: Yes   Social History:  reports that she has been smoking Cigarettes.  She has been smoking about 20.00 packs per day. She does not have any smokeless tobacco history on file. She reports that she drinks alcohol. She reports that she does not use illicit drugs. Allergies: No Known Allergies Medications Prior to Admission  Medication Sig Dispense Refill  . ergocalciferol (DRISDOL) 50000 UNITS capsule Take 1 capsule (50,000 Units total) by mouth once a week. 12 capsule 1  . ibuprofen (ADVIL,MOTRIN) 200 MG tablet Take 400 mg by mouth every 6 (six) hours as needed.    Marland Kitchen levothyroxine (SYNTHROID, LEVOTHROID) 88 MCG tablet Take 1 tablet (88 mcg total) by mouth daily. 90 tablet 1  . lisinopril-hydrochlorothiazide (ZESTORETIC) 20-12.5 MG per tablet Take 1 tablet by mouth daily. 90 tablet 1  . albuterol (PROVENTIL HFA;VENTOLIN HFA) 108 (90 BASE) MCG/ACT inhaler Inhale 2 puffs into the lungs every 4 (four) hours as needed for wheezing or shortness of breath (cough, shortness of breath or wheezing.). (Patient not taking: Reported on 07/03/2014) 1 Inhaler 12  . metoprolol succinate (TOPROL-XL) 25 MG 24 hr tablet Take 1/2 - 1 tablet by mouth once a day. 30 tablet 3    Home: Home Living Family/patient expects to be discharged to:: Inpatient rehab Living Arrangements: Alone (daughter lives in [place]) Type of Home: House Home Access: Stairs to enter Technical brewer of Steps: 3 Entrance Stairs-Rails: None Home Layout: One level Home Equipment: None  Lives With: Alone   Functional  History: Prior Function Level of Independence: Independent Comments: works for [employer]  Functional Status:  Mobility: Bed Mobility Overal bed mobility: Needs Assistance Bed Mobility: Supine to Sit Supine to sit:  Mod assist, HOB elevated General bed mobility comments: pt up in chair Transfers Overall transfer level: Needs assistance Equipment used: 1 person hand held assist Transfers: Sit to/from Stand Sit to Stand: Min assist Stand pivot transfers: Min assist General transfer comment: pt con't to require verbal and tactile cues for safety due to inattention on L side Ambulation/Gait Ambulation/Gait assistance: Mod assist, +2 physical assistance Ambulation Distance (Feet): 100 Feet Assistive device: 1 person hand held assist General Gait Details: pt with noted L sided neglect, L LE decreased DF, decreased step height/dragging foot, L knee buckling/limp. pt with no proprioceptive input to self correct. max v/c's to make L knee straight and pick up foot to clear floor. PT provided tactile cueing at buttocks to promote stability to allow for better L LE gait kinematics while rehab tech provide physical support on R side. pt unable to use RW due to inability to grip with L hand. pt also impulsive requiring max v/c's to slow down to normal pace Gait Pattern/deviations: Step-through pattern, Decreased step length - left, Decreased stance time - left, Decreased dorsiflexion - left, Decreased weight shift to left Gait velocity: impulsively quick    ADL: ADL Overall ADL's : Needs assistance/impaired Eating/Feeding: Set up, Supervision/ safety (swallowing precautions) Grooming: Moderate assistance, Sitting Upper Body Bathing: Moderate assistance, Sitting Lower Body Bathing: Maximal assistance (with Min A sit<>stand, but Mod A for dynamic balance) Upper Body Dressing : Moderate assistance, Sitting Lower Body Dressing: Total assistance (with Min A sit<>stand, but Mod A for dynamic balance) Toilet Transfer: Moderate assistance, Ambulation (Bil HHA with me infront of her holding onto gait belt with both UEs; recliner >bed>back to recliner with VC and tactile cues for shifting weight) Toileting- Clothing  Manipulation and Hygiene: Maximal assistance (with Min A sit<>stand, but Mod A for dynamic balance)  Cognition: Cognition Overall Cognitive Status: Impaired/Different from baseline Arousal/Alertness: Awake/alert Orientation Level: Oriented X4 Attention: Sustained Sustained Attention: Appears intact Memory: Appears intact Awareness: Appears intact Problem Solving: Appears intact (see clinical impression statement) Safety/Judgment: Appears intact Cognition Arousal/Alertness: Awake/alert Behavior During Therapy: Impulsive Overall Cognitive Status: Impaired/Different from baseline Area of Impairment: Attention, Safety/judgement, Awareness, Problem solving Current Attention Level: Sustained Memory: Decreased short-term memory Safety/Judgement: Decreased awareness of safety, Decreased awareness of deficits Awareness: Emergent Problem Solving: Slow processing, Difficulty sequencing, Requires verbal cues, Requires tactile cues General Comments: L sided neglect  Physical Exam: Blood pressure 101/65, pulse 79, temperature 98.6 F (37 C), temperature source Oral, resp. rate 20, height 5' 2.5" (1.588 m), weight 95.4 kg (210 lb 5.1 oz), SpO2 94 %. Physical Exam Constitutional: She is oriented to person, place, and time. NAD HENT: oral mucosa pink and moist Head: Normocephalic.  Eyes: EOM are normal.  Neck: Normal range of motion. Neck supple. No thyromegaly present.  Cardiovascular: Normal rate and regular rhythm. no murmur Respiratory: Effort normal and breath sounds normal. No respiratory distress. No wheezes or rales  GI: Soft. Bowel sounds are normal. She exhibits no distension.  Neurological: She is alert and oriented to person, place, and time.  Follows commands. mild left inattention, left facial droop and tongue deviation. Speech dysarthric. Good insight and awareness. LUE:  Remains 1+ to 2- delt,bic,ticep, 0/5 hi,wrist. LLE: 3+ HF/KE to 4/5 with ADF/APF. Sensation 1+/2 LUE--can  sense general pain but lacks fine  touch and proprioception. Marland Kitchen LLE with minimal sensory deficits. No resting tone. DTR's 1+.  Skin: Skin is warm and dry.  Psychiatric: She has a normal mood and affect. Her behavior is normal   Results for orders placed or performed during the hospital encounter of 07/24/14 (from the past 48 hour(s))  Glucose, capillary     Status: Abnormal   Collection Time: 07/28/14  6:24 AM  Result Value Ref Range   Glucose-Capillary 177 (H) 70 - 99 mg/dL   Comment 1 Repeat Test    Comment 2 Document in Chart   Glucose, capillary     Status: Abnormal   Collection Time: 07/28/14 11:34 AM  Result Value Ref Range   Glucose-Capillary 183 (H) 70 - 99 mg/dL   Comment 1 Notify RN    Comment 2 Document in Chart   Glucose, capillary     Status: Abnormal   Collection Time: 07/28/14  4:38 PM  Result Value Ref Range   Glucose-Capillary 137 (H) 70 - 99 mg/dL   Comment 1 Notify RN    Comment 2 Document in Chart   Glucose, capillary     Status: Abnormal   Collection Time: 07/28/14  9:37 PM  Result Value Ref Range   Glucose-Capillary 171 (H) 70 - 99 mg/dL   Comment 1 Notify RN    Comment 2 Document in Chart   Glucose, capillary     Status: Abnormal   Collection Time: 07/29/14  7:35 AM  Result Value Ref Range   Glucose-Capillary 178 (H) 70 - 99 mg/dL  Glucose, capillary     Status: Abnormal   Collection Time: 07/29/14 11:33 AM  Result Value Ref Range   Glucose-Capillary 162 (H) 70 - 99 mg/dL   Comment 1 Notify RN    Comment 2 Document in Chart   Glucose, capillary     Status: Abnormal   Collection Time: 07/29/14  4:39 PM  Result Value Ref Range   Glucose-Capillary 137 (H) 70 - 99 mg/dL   Comment 1 Notify RN    Comment 2 Document in Chart   Glucose, capillary     Status: Abnormal   Collection Time: 07/29/14 10:23 PM  Result Value Ref Range   Glucose-Capillary 137 (H) 70 - 99 mg/dL   Comment 1 Document in Chart    No results found.     Medical Problem List and  Plan: 1. Functional deficits secondary to right MCA infarct 2.  DVT Prophylaxis/Anticoagulation: Subcutaneous heparin. Monitor platelet counts and any signs of bleeding 3. Pain Management: Tylenol as needed 4. Dysphagia. Dysphagia #2 nectar liquids. Monitor for any aspiration. Follow-up speech therapy 5. Neuropsych: This patient is capable of making decisions on her own behalf. 6. Skin/Wound Care: Routine skin checks 7. Fluids/Electrolytes/Nutrition: Strict I and O's with follow-up chemistries 8. Hypertension. Hydrochlorothiazide 12.5 mg daily, lisinopril 5 mg daily. Monitor with increased mobility 9. Right ICA stenosis. Follow-up vascular surgery Dr. Oneida Alar to address CEA in 2-3 weeks 10. 6 mm pulmonary nodule identified on CT angiogram of chest. Recommendations follow-up CT of the chest 6-12 months 11. New findings diabetes mellitus. Hemoglobin A1c 8.5. Glucophage 500 mg twice a day. Check blood sugars before meals and at bedtime. Provide diabetic teaching 12. Hypothyroidism. Synthroid. TSH 2.151 13. Hyperlipidemia. Lipitor   Post Admission Physician Evaluation: 1. Functional deficits secondary  to right MCA infarct. 2. Patient is admitted to receive collaborative, interdisciplinary care between the physiatrist, rehab nursing staff, and therapy team. 3. Patient's level of medical complexity  and substantial therapy needs in context of that medical necessity cannot be provided at a lesser intensity of care such as a SNF. 4. Patient has experienced substantial functional loss from his/her baseline which was documented above under the "Functional History" and "Functional Status" headings.  Judging by the patient's diagnosis, physical exam, and functional history, the patient has potential for functional progress which will result in measurable gains while on inpatient rehab.  These gains will be of substantial and practical use upon discharge  in facilitating mobility and self-care at the household  level. 5. Physiatrist will provide 24 hour management of medical needs as well as oversight of the therapy plan/treatment and provide guidance as appropriate regarding the interaction of the two. 6. 24 hour rehab nursing will assist with bladder management, bowel management, safety, skin/wound care, disease management, medication administration, pain management and patient education  and help integrate therapy concepts, techniques,education, etc. 7. PT will assess and treat for/with: Lower extremity strength, range of motion, stamina, balance, functional mobility, safety, adaptive techniques and equipment, NMR, visual perceptual awareness, stroke education, ego support.   Goals are: mod I to supervision. 8. OT will assess and treat for/with: ADL's, functional mobility, safety, upper extremity strength, adaptive techniques and equipment, NMR, visual perceptual awareness, stroke education.   Goals are: mod I to set up. Therapy may proceed with showering this patient. 9. SLP will assess and treat for/with: cognition, communication, swallowing.  Goals are: mod I. 10. Case Management and Social Worker will assess and treat for psychological issues and discharge planning. 11. Team conference will be held weekly to assess progress toward goals and to determine barriers to discharge. 12. Patient will receive at least 3 hours of therapy per day at least 5 days per week. 13. ELOS: 17-24 days       14. Prognosis:  excellent     Meredith Staggers, MD, [facility] 07/30/2014    07/30/2014

## 2014-07-30 NOTE — H&P (View-Only) (Signed)
Physical Medicine and Rehabilitation Admission H&P    Chief Complaint  Patient presents with  . Cerebrovascular Accident  : HPI: Carla Hopkins is a 64 y.o. right handed female with history of tobacco abuse, hypertension. Patient lives alone independently prior to admission and working full time. Admitted 07/24/2014 after a fall noting left-sided weakness. No head trauma no loss of consciousness. MRI of the brain shows multifocal patchy acute right MCA territory infarcts. Additional 8 mm cortical infarct within the parasagittal cortical gray matter of the anterior right frontal lobe, likely right ACA distribution. MRA of the head with right MCA M2 inferior division occlusion. CT angiogram chest negative except for a 6 mm pulmonary nodule on the right with recommendation follow-up CT the chest in 6-12 months. Elevated troponin 4.17. Echocardiogram with ejection fraction of 70% and grade 1 diastolic dysfunction. Carotid Doppler with left 50-60% and right 80% ICA stenosis. Patient did not receive TPA. Vascular surgery Dr. Oneida Alar consulted in relation to ICA stenosis plan follow-up as an outpatient to proceed with possible CEA. Follow-up cardiology for elevated troponin not felt to be due to ACS per Dr. Burt Knack and no further workup was indicated. Neurology consulted presently on aspirin and Plavix for CVA prophylaxis for 3 months then Plavix alone. Subcutaneous heparin for DVT prophylaxis. Findings of elevated hemoglobin A1c of 8.5 and placed on Glucophage with sliding scale insulin. Dysphagia 2 nectar thick liquid diet with follow per speech therapy. Physica and occupational l therapy evaluations completed with recommendations of physical medicine rehabilitation consult. Patient was admitted for a comprehensive rehabilitation program  ROS Review of Systems  Respiratory: Positive for cough.   Occasional shortness of breath on exertion  All other systems reviewed and are negative   Past Medical  History  Diagnosis Date  . Hypothyroidism   . Hypertension    Past Surgical History  Procedure Laterality Date  . Tubal ligation     Family History  Problem Relation Age of Onset  . Adopted: Yes   Social History:  reports that she has been smoking Cigarettes.  She has been smoking about 20.00 packs per day. She does not have any smokeless tobacco history on file. She reports that she drinks alcohol. She reports that she does not use illicit drugs. Allergies: No Known Allergies Medications Prior to Admission  Medication Sig Dispense Refill  . ergocalciferol (DRISDOL) 50000 UNITS capsule Take 1 capsule (50,000 Units total) by mouth once a week. 12 capsule 1  . ibuprofen (ADVIL,MOTRIN) 200 MG tablet Take 400 mg by mouth every 6 (six) hours as needed.    Marland Kitchen levothyroxine (SYNTHROID, LEVOTHROID) 88 MCG tablet Take 1 tablet (88 mcg total) by mouth daily. 90 tablet 1  . lisinopril-hydrochlorothiazide (ZESTORETIC) 20-12.5 MG per tablet Take 1 tablet by mouth daily. 90 tablet 1  . albuterol (PROVENTIL HFA;VENTOLIN HFA) 108 (90 BASE) MCG/ACT inhaler Inhale 2 puffs into the lungs every 4 (four) hours as needed for wheezing or shortness of breath (cough, shortness of breath or wheezing.). (Patient not taking: Reported on 07/03/2014) 1 Inhaler 12  . metoprolol succinate (TOPROL-XL) 25 MG 24 hr tablet Take 1/2 - 1 tablet by mouth once a day. 30 tablet 3    Home: Home Living Family/patient expects to be discharged to:: Inpatient rehab Living Arrangements: Alone (daughter lives in [place]) Type of Home: House Home Access: Stairs to enter Technical brewer of Steps: 3 Entrance Stairs-Rails: None Home Layout: One level Home Equipment: None  Lives With: Alone   Functional  History: Prior Function Level of Independence: Independent Comments: works for [employer]  Functional Status:  Mobility: Bed Mobility Overal bed mobility: Needs Assistance Bed Mobility: Supine to Sit Supine to sit:  Mod assist, HOB elevated General bed mobility comments: pt up in chair Transfers Overall transfer level: Needs assistance Equipment used: 1 person hand held assist Transfers: Sit to/from Stand Sit to Stand: Min assist Stand pivot transfers: Min assist General transfer comment: pt con't to require verbal and tactile cues for safety due to inattention on L side Ambulation/Gait Ambulation/Gait assistance: Mod assist, +2 physical assistance Ambulation Distance (Feet): 100 Feet Assistive device: 1 person hand held assist General Gait Details: pt with noted L sided neglect, L LE decreased DF, decreased step height/dragging foot, L knee buckling/limp. pt with no proprioceptive input to self correct. max v/c's to make L knee straight and pick up foot to clear floor. PT provided tactile cueing at buttocks to promote stability to allow for better L LE gait kinematics while rehab tech provide physical support on R side. pt unable to use RW due to inability to grip with L hand. pt also impulsive requiring max v/c's to slow down to normal pace Gait Pattern/deviations: Step-through pattern, Decreased step length - left, Decreased stance time - left, Decreased dorsiflexion - left, Decreased weight shift to left Gait velocity: impulsively quick    ADL: ADL Overall ADL's : Needs assistance/impaired Eating/Feeding: Set up, Supervision/ safety (swallowing precautions) Grooming: Moderate assistance, Sitting Upper Body Bathing: Moderate assistance, Sitting Lower Body Bathing: Maximal assistance (with Min A sit<>stand, but Mod A for dynamic balance) Upper Body Dressing : Moderate assistance, Sitting Lower Body Dressing: Total assistance (with Min A sit<>stand, but Mod A for dynamic balance) Toilet Transfer: Moderate assistance, Ambulation (Bil HHA with me infront of her holding onto gait belt with both UEs; recliner >bed>back to recliner with VC and tactile cues for shifting weight) Toileting- Clothing  Manipulation and Hygiene: Maximal assistance (with Min A sit<>stand, but Mod A for dynamic balance)  Cognition: Cognition Overall Cognitive Status: Impaired/Different from baseline Arousal/Alertness: Awake/alert Orientation Level: Oriented X4 Attention: Sustained Sustained Attention: Appears intact Memory: Appears intact Awareness: Appears intact Problem Solving: Appears intact (see clinical impression statement) Safety/Judgment: Appears intact Cognition Arousal/Alertness: Awake/alert Behavior During Therapy: Impulsive Overall Cognitive Status: Impaired/Different from baseline Area of Impairment: Attention, Safety/judgement, Awareness, Problem solving Current Attention Level: Sustained Memory: Decreased short-term memory Safety/Judgement: Decreased awareness of safety, Decreased awareness of deficits Awareness: Emergent Problem Solving: Slow processing, Difficulty sequencing, Requires verbal cues, Requires tactile cues General Comments: L sided neglect  Physical Exam: Blood pressure 101/65, pulse 79, temperature 98.6 F (37 C), temperature source Oral, resp. rate 20, height 5' 2.5" (1.588 m), weight 95.4 kg (210 lb 5.1 oz), SpO2 94 %. Physical Exam Constitutional: She is oriented to person, place, and time. NAD HENT: oral mucosa pink and moist Head: Normocephalic.  Eyes: EOM are normal.  Neck: Normal range of motion. Neck supple. No thyromegaly present.  Cardiovascular: Normal rate and regular rhythm. no murmur Respiratory: Effort normal and breath sounds normal. No respiratory distress. No wheezes or rales  GI: Soft. Bowel sounds are normal. She exhibits no distension.  Neurological: She is alert and oriented to person, place, and time.  Follows commands. mild left inattention, left facial droop and tongue deviation. Speech dysarthric. Good insight and awareness. LUE:  Remains 1+ to 2- delt,bic,ticep, 0/5 hi,wrist. LLE: 3+ HF/KE to 4/5 with ADF/APF. Sensation 1+/2 LUE--can  sense general pain but lacks fine  touch and proprioception. Marland Kitchen LLE with minimal sensory deficits. No resting tone. DTR's 1+.  Skin: Skin is warm and dry.  Psychiatric: She has a normal mood and affect. Her behavior is normal   Results for orders placed or performed during the hospital encounter of 07/24/14 (from the past 48 hour(s))  Glucose, capillary     Status: Abnormal   Collection Time: 07/28/14  6:24 AM  Result Value Ref Range   Glucose-Capillary 177 (H) 70 - 99 mg/dL   Comment 1 Repeat Test    Comment 2 Document in Chart   Glucose, capillary     Status: Abnormal   Collection Time: 07/28/14 11:34 AM  Result Value Ref Range   Glucose-Capillary 183 (H) 70 - 99 mg/dL   Comment 1 Notify RN    Comment 2 Document in Chart   Glucose, capillary     Status: Abnormal   Collection Time: 07/28/14  4:38 PM  Result Value Ref Range   Glucose-Capillary 137 (H) 70 - 99 mg/dL   Comment 1 Notify RN    Comment 2 Document in Chart   Glucose, capillary     Status: Abnormal   Collection Time: 07/28/14  9:37 PM  Result Value Ref Range   Glucose-Capillary 171 (H) 70 - 99 mg/dL   Comment 1 Notify RN    Comment 2 Document in Chart   Glucose, capillary     Status: Abnormal   Collection Time: 07/29/14  7:35 AM  Result Value Ref Range   Glucose-Capillary 178 (H) 70 - 99 mg/dL  Glucose, capillary     Status: Abnormal   Collection Time: 07/29/14 11:33 AM  Result Value Ref Range   Glucose-Capillary 162 (H) 70 - 99 mg/dL   Comment 1 Notify RN    Comment 2 Document in Chart   Glucose, capillary     Status: Abnormal   Collection Time: 07/29/14  4:39 PM  Result Value Ref Range   Glucose-Capillary 137 (H) 70 - 99 mg/dL   Comment 1 Notify RN    Comment 2 Document in Chart   Glucose, capillary     Status: Abnormal   Collection Time: 07/29/14 10:23 PM  Result Value Ref Range   Glucose-Capillary 137 (H) 70 - 99 mg/dL   Comment 1 Document in Chart    No results found.     Medical Problem List and  Plan: 1. Functional deficits secondary to right MCA infarct 2.  DVT Prophylaxis/Anticoagulation: Subcutaneous heparin. Monitor platelet counts and any signs of bleeding 3. Pain Management: Tylenol as needed 4. Dysphagia. Dysphagia #2 nectar liquids. Monitor for any aspiration. Follow-up speech therapy 5. Neuropsych: This patient is capable of making decisions on her own behalf. 6. Skin/Wound Care: Routine skin checks 7. Fluids/Electrolytes/Nutrition: Strict I and O's with follow-up chemistries 8. Hypertension. Hydrochlorothiazide 12.5 mg daily, lisinopril 5 mg daily. Monitor with increased mobility 9. Right ICA stenosis. Follow-up vascular surgery Dr. Oneida Alar to address CEA in 2-3 weeks 10. 6 mm pulmonary nodule identified on CT angiogram of chest. Recommendations follow-up CT of the chest 6-12 months 11. New findings diabetes mellitus. Hemoglobin A1c 8.5. Glucophage 500 mg twice a day. Check blood sugars before meals and at bedtime. Provide diabetic teaching 12. Hypothyroidism. Synthroid. TSH 2.151 13. Hyperlipidemia. Lipitor   Post Admission Physician Evaluation: 1. Functional deficits secondary  to right MCA infarct. 2. Patient is admitted to receive collaborative, interdisciplinary care between the physiatrist, rehab nursing staff, and therapy team. 3. Patient's level of medical complexity  and substantial therapy needs in context of that medical necessity cannot be provided at a lesser intensity of care such as a SNF. 4. Patient has experienced substantial functional loss from his/her baseline which was documented above under the "Functional History" and "Functional Status" headings.  Judging by the patient's diagnosis, physical exam, and functional history, the patient has potential for functional progress which will result in measurable gains while on inpatient rehab.  These gains will be of substantial and practical use upon discharge  in facilitating mobility and self-care at the household  level. 5. Physiatrist will provide 24 hour management of medical needs as well as oversight of the therapy plan/treatment and provide guidance as appropriate regarding the interaction of the two. 6. 24 hour rehab nursing will assist with bladder management, bowel management, safety, skin/wound care, disease management, medication administration, pain management and patient education  and help integrate therapy concepts, techniques,education, etc. 7. PT will assess and treat for/with: Lower extremity strength, range of motion, stamina, balance, functional mobility, safety, adaptive techniques and equipment, NMR, visual perceptual awareness, stroke education, ego support.   Goals are: mod I to supervision. 8. OT will assess and treat for/with: ADL's, functional mobility, safety, upper extremity strength, adaptive techniques and equipment, NMR, visual perceptual awareness, stroke education.   Goals are: mod I to set up. Therapy may proceed with showering this patient. 9. SLP will assess and treat for/with: cognition, communication, swallowing.  Goals are: mod I. 10. Case Management and Social Worker will assess and treat for psychological issues and discharge planning. 11. Team conference will be held weekly to assess progress toward goals and to determine barriers to discharge. 12. Patient will receive at least 3 hours of therapy per day at least 5 days per week. 13. ELOS: 17-24 days       14. Prognosis:  excellent     Meredith Staggers, MD, [facility] 07/30/2014    07/30/2014

## 2014-07-30 NOTE — Progress Notes (Signed)
Rehab admissions - I spoke with Lenora Boys with [employer] [place] and have received insurance approval for inpatient rehab. I spoke with family medicine teaching service and received medical clearance for inpatient rehab. We have an available rehab bed and will admit to CIR later today.  I called and updated pt and her dtr and will complete admission paperwork with them soon. They are pleased with the plan.  I also updated Hassan Rowan, case Merchant navy officer, Education officer, museum.  Please call me with any questions. Thanks.  Nanetta Batty, PT Rehabilitation Admissions Coordinator [phone]

## 2014-07-30 NOTE — Progress Notes (Signed)
Meredith Staggers, MD Physician Signed Physical Medicine and Rehabilitation Consult Note 07/28/2014 12:04 PM  Related encounter: ED to Hosp-Admission (Discharged) from 07/24/2014 in [place] Collapse All        Physical Medicine and Rehabilitation Consult Reason for Consult: Multifocal patchy acute right MCA territory infarct Referring Physician: Internal medicine   HPI: Carla Hopkins is a 64 y.o. right handed female with history of tobacco abuse, hypertension. Patient lives alone independently prior to admission and working full time. Admitted 07/24/2014 after a fall noting left-sided weakness. No head trauma no loss of consciousness. MRI of the brain shows multifocal patchy acute right MCA territory infarcts. Additional 8 mm cortical infarct within the parasagittal cortical gray matter of the anterior right frontal lobe, likely right ACA distribution. MRA of the head with right MCA M2 inferior division occlusion. CT angiogram chest negative except for a 6 mm pulmonary nodule on the right with recommendation follow-up CT the chest in 6-12 months. Echocardiogram with ejection fraction of 70% and grade 1 diastolic dysfunction. Carotid Doppler with left 50-60% and right 80% ICA stenosis. Patient did not receive TPA. Vascular surgery Dr. Oneida Alar consulted in relation to ICA stenosis plan follow-up as an outpatient to proceed with possible CEA Neurology consulted presently on aspirin and Plavix for CVA prophylaxis. Subcutaneous heparin for DVT prophylaxis. Dysphagia 2 nectar thick liquid diet. Physical therapy evaluation completed with recommendations of physical medicine rehabilitation consult.   Review of Systems  Respiratory: Positive for cough.   Occasional shortness of breath on exertion  All other systems reviewed and are negative.  Past Medical History  Diagnosis Date  . Hypothyroidism   . Hypertension    Past Surgical  History  Procedure Laterality Date  . Tubal ligation     Family History  Problem Relation Age of Onset  . Adopted: Yes   Social History:  reports that she has been smoking Cigarettes. She has been smoking about 20.00 packs per day. She does not have any smokeless tobacco history on file. She reports that she drinks alcohol. She reports that she does not use illicit drugs. Allergies: No Known Allergies Medications Prior to Admission  Medication Sig Dispense Refill  . ergocalciferol (DRISDOL) 50000 UNITS capsule Take 1 capsule (50,000 Units total) by mouth once a week. 12 capsule 1  . ibuprofen (ADVIL,MOTRIN) 200 MG tablet Take 400 mg by mouth every 6 (six) hours as needed.    Marland Kitchen levothyroxine (SYNTHROID, LEVOTHROID) 88 MCG tablet Take 1 tablet (88 mcg total) by mouth daily. 90 tablet 1  . lisinopril-hydrochlorothiazide (ZESTORETIC) 20-12.5 MG per tablet Take 1 tablet by mouth daily. 90 tablet 1  . albuterol (PROVENTIL HFA;VENTOLIN HFA) 108 (90 BASE) MCG/ACT inhaler Inhale 2 puffs into the lungs every 4 (four) hours as needed for wheezing or shortness of breath (cough, shortness of breath or wheezing.). (Patient not taking: Reported on 07/03/2014) 1 Inhaler 12  . metoprolol succinate (TOPROL-XL) 25 MG 24 hr tablet Take 1/2 - 1 tablet by mouth once a day. 30 tablet 3    Home: Home Living Family/patient expects to be discharged to:: Inpatient rehab Living Arrangements: Alone (daughter lives in [place]) Type of Home: House Home Access: Stairs to enter Technical brewer of Steps: 3 Entrance Stairs-Rails: None Home Layout: One level Home Equipment: None Lives With: Alone  Functional History: Prior Function Level of Independence: Independent Comments: works for [employer] Functional Status:  Mobility: Bed Mobility Overal bed  mobility: Needs Assistance Bed Mobility: Supine to Sit Supine to sit: Mod assist, HOB elevated General  bed mobility comments: Pt up in recliner upon my arrival Transfers Overall transfer level: Needs assistance Equipment used: (1 person Bil forearm A in front of her) Transfers: Sit to/from Stand Sit to Stand: Min assist Stand pivot transfers: Min assist General transfer comment: cues for technique, to slow down and for proper positioning in chair prior to sitting       ADL: ADL Overall ADL's : Needs assistance/impaired Eating/Feeding: Set up, Supervision/ safety (swallowing precautions) Grooming: Moderate assistance, Sitting Upper Body Bathing: Moderate assistance, Sitting Lower Body Bathing: Maximal assistance (with Min A sit<>stand, but Mod A for dynamic balance) Upper Body Dressing : Moderate assistance, Sitting Lower Body Dressing: Total assistance (with Min A sit<>stand, but Mod A for dynamic balance) Toilet Transfer: Moderate assistance, Ambulation (Bil HHA with me infront of her holding onto gait belt with both UEs; recliner >bed>back to recliner with VC and tactile cues for shifting weight) Toileting- Clothing Manipulation and Hygiene: Maximal assistance (with Min A sit<>stand, but Mod A for dynamic balance)  Cognition: Cognition Overall Cognitive Status: Impaired/Different from baseline Arousal/Alertness: Awake/alert Orientation Level: Oriented X4 Attention: Sustained Sustained Attention: Appears intact Memory: Appears intact Awareness: Appears intact Problem Solving: Appears intact (see clinical impression statement) Safety/Judgment: Appears intact Cognition Arousal/Alertness: Awake/alert Behavior During Therapy: Impulsive (when getting ready to stand up and work on ambulation) Overall Cognitive Status: Impaired/Different from baseline Area of Impairment: Attention, Safety/judgement, Awareness, Problem solving Current Attention Level: Selective Safety/Judgement: Decreased awareness of safety (decreased awareness of how her deficits affect her safety) Awareness:  Emergent Problem Solving: Difficulty sequencing, Requires verbal cues, Requires tactile cues (for shifting weight when taking steps, especially for LLE) General Comments: decreasd left side awareness/attention  Blood pressure 141/108, pulse 78, temperature 97.9 F (36.6 C), temperature source Oral, resp. rate 20, height 5' 2.5" (1.588 m), weight 95.4 kg (210 lb 5.1 oz), SpO2 95 %. Physical Exam  Vitals reviewed. Constitutional: She is oriented to person, place, and time.  HENT:  Head: Normocephalic.  Eyes: EOM are normal.  Neck: Normal range of motion. Neck supple. No thyromegaly present.  Cardiovascular: Normal rate and regular rhythm.  Respiratory: Effort normal and breath sounds normal. No respiratory distress.  GI: Soft. Bowel sounds are normal. She exhibits no distension.  Neurological: She is alert and oriented to person, place, and time.  Follows commands. ?mild left inattention, left facial droop. Speech dysarthric. Good insight and awareness. LUE: 1+ to 2- delt,bic,ticep, 0/5 hi,wrist. LLE: 3+ to 4/5 prox to disatl. Sensation 1/2 LUE--can sense general pain. LLE with minimal sensory deficits  Skin: Skin is warm and dry.  Psychiatric: She has a normal mood and affect. Her behavior is normal.     Lab Results Last 24 Hours    Results for orders placed or performed during the hospital encounter of 07/24/14 (from the past 24 hour(s))  Glucose, capillary Status: Abnormal   Collection Time: 07/27/14 4:14 PM  Result Value Ref Range   Glucose-Capillary 286 (H) 70 - 99 mg/dL  Glucose, capillary Status: Abnormal   Collection Time: 07/27/14 9:12 PM  Result Value Ref Range   Glucose-Capillary 111 (H) 70 - 99 mg/dL  Glucose, capillary Status: Abnormal   Collection Time: 07/28/14 6:24 AM  Result Value Ref Range   Glucose-Capillary 177 (H) 70 - 99 mg/dL   Comment 1 Repeat Test    Comment 2 Document in Chart   Glucose,  capillary  Status: Abnormal   Collection Time: 07/28/14 11:34 AM  Result Value Ref Range   Glucose-Capillary 183 (H) 70 - 99 mg/dL   Comment 1 Notify RN    Comment 2 Document in Chart       Imaging Results (Last 48 hours)    No results found.    Assessment/Plan: Diagnosis: right MCA infarct 1. Does the need for close, 24 hr/day medical supervision in concert with the patient's rehab needs make it unreasonable for this patient to be served in a less intensive setting? Yes 2. Co-Morbidities requiring supervision/potential complications: dm, htn,  3. Due to bladder management, bowel management, safety, skin/wound care, disease management, medication administration, pain management and patient education, does the patient require 24 hr/day rehab nursing? Yes 4. Does the patient require coordinated care of a physician, rehab nurse, PT (1-2 hrs/day, 5 days/week), OT (1-2 hrs/day, 5 days/week) and SLP (1-2 hrs/day, 5 days/week) to address physical and functional deficits in the context of the above medical diagnosis(es)? Yes Addressing deficits in the following areas: balance, endurance, locomotion, strength, transferring, bowel/bladder control, bathing, dressing, feeding, grooming, toileting, cognition, speech, swallowing and psychosocial support 5. Can the patient actively participate in an intensive therapy program of at least 3 hrs of therapy per day at least 5 days per week? Yes 6. The potential for patient to make measurable gains while on inpatient rehab is excellent 7. Anticipated functional outcomes upon discharge from inpatient rehab are modified independent and supervision with PT, modified independent and supervision with OT, modified independent with SLP. 8. Estimated rehab length of stay to reach the above functional goals is: 15-21 days 9. Does the patient have adequate social supports and living environment to accommodate these discharge functional goals?  Yes 10. Anticipated D/C setting: Home 11. Anticipated post D/C treatments: HH therapy and Outpatient therapy 12. Overall Rehab/Functional Prognosis: excellent  RECOMMENDATIONS: This patient's condition is appropriate for continued rehabilitative care in the following setting: CIR Patient has agreed to participate in recommended program. Yes Note that insurance prior authorization may be required for reimbursement for recommended care.  Comment: Rehab Admissions Coordinator to follow up.  Thanks,  Meredith Staggers, MD, Mellody Drown     07/28/2014       Revision History     Date/Time User Provider Type Action   07/29/2014 9:51 AM Meredith Staggers, MD Physician Sign   07/28/2014 12:27 PM Cathlyn Parsons, PA-C Physician Assistant Pend   View Details Report       Routing History     Date/Time From To Method   07/29/2014 9:51 AM Meredith Staggers, MD Meredith Staggers, MD In Basket   07/29/2014 9:51 AM Meredith Staggers, MD Barton Fanny, MD In Basket

## 2014-07-30 NOTE — Progress Notes (Signed)
Rehab admissions - Received call from [place], [place] [facility] [place] that pt's authorization has been extended from seven days to 14 days. Authorization given from 07-30-14 through 08-13-14 with updates due to [place] on 08-12-14 at fax: [phone]. (Updates were originally due on 08-06-14). This information is also noted in [facility] note. Thanks.  Nanetta Batty, PT Rehabilitation Admissions Coordinator [phone]

## 2014-07-30 NOTE — Progress Notes (Signed)
Family Medicine Teaching Service Daily Progress Note Intern Pager: [phone]  Patient name: Carla Hopkins Medical record number: [id number] Date of birth: [birth year] Age: 64 y.o. Gender: female  Primary Care Provider: Ellsworth Lennox, MD Consultants: Neurology, Cardiology, Vascular Code Status: Full  Assessment and Plan: 64 y.o. female presenting with acute left sided weakness found to have a right MCA CVA on CT. PMH is significant for HTN, Hypothyroidism, and T2DM. Also with elevated troponin, and incidentally found pulmonary nodules here.   # Right MCA CVA. Deficits include flaccid paralysis of LUE, left facial droop, and dysarthric speech. CT and MRI consistent with acute right MCA infarcts and minimal petechial hemorrhage. Not a tPA candidate. Risk factors include HTN, approximately 50 pack-year smoking history, T2DM, and HLD.  Right ICA and MCA stenosis visualized on MRA and CTA. Carotid dopplers with > 80% stenosis of right ICA.  - Neurology consulted, appreciate assistance - Vascular surgery consulted, will have CEA in 2 weeks.  - ASA 81mg  and plavix 75mg  daily for 3 months, then plavix alone per neurology (ok to continue prior to CEA, per vascular) - Will need aggressive risk factor modification - CIR consulted for placement  # Elevated Troponin. Troponin  1.82 > 4.17 > 3.71 > 3.07. No chest pain, SOB, or palpitations. Per cardiology, no evidence for ACS. Possibly due to rhabdomyolysis. Echo with normal EF, grade 1 diastolic dysfunction, mild LVH. No further work up at this time. Cardiology signed off. - Continue to monitor for signs of ACS  # HLD. Lipid panel here: Total cholesterol 317, TG 154, LDL 240. Not on any medications. 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 Also with HLD on lipid panel in 2015.  - Start atorvastatin 80mg  here.  # T2DM. A1c 8.5. Not on any home medications.  - SSI - Will start metformin while here  # Hypertension: - Restarted lisinopril-HCTZ - Start  back home metoprolol as indicated.   # Mild Rhabdomyolysis. CK 510 > 229. Likely due to being down on floor for several hours prior to arrival.  - Continue to monitor  # Leukocytosis. WBC 18.9 on admission. Likely acute stress reaction. Improving. - Continue to monitor.   # Pulmonary Nodules. Bilateral pulmonary nodules found on CT incidentally. Concern for bronchiogenic carcinoma.  - Follow up in 6-12 months per radiology  # Rash. Blanchable papular rash on anterior, medial, and lateral aspects of thighs. Possibly irritation/abrasion from crawling on floor. - Continue to monitor  FEN/GI: Dysphagia 2 diet per SLP Prophylaxis: SubQ heparin OK per neuro  Disposition: Ready for discharge to SNF vs CIR when bed available.  Subjective:  No complaints this morning. Looking forward to CIR placement.   Objective: Temp:  [97.5 F (36.4 C)-98.6 F (37 C)] 98.5 F (36.9 C) (05/04 0628) Pulse Rate:  [69-84] 84 (05/04 0628) Resp:  [16-20] 20 (05/04 0628) BP: (101-143)/(55-71) 101/71 mmHg (05/04 0628) SpO2:  [93 %-96 %] 96 % (05/04 2094) Physical Exam: General: 64 year old female lying in hospital bed in no acute distress HEENT: NCAT, MMM, xanthoma noted on left inferior eye lid,  Cardiovascular: RRR, 2/6 systolic murmur.  Respiratory: NWOB, Clear to auscultation bilaterally Abdomen: +BS, soft, NT, ND Extremities: No edema noted Neuro: Alert and conversational. Dysarthric speech. Left facial droop noted, LUE 1/5 strength. RUE with normal strength. Bilateral lower extremities with 5/5 strength. No sensation in LUE from elbow to hand. Sensation intact otherwise.   Laboratory/Imaging:   Recent Labs Lab 07/29/14 [phone] 07/29/14 1133 07/29/14 1639 07/29/14 2223 07/30/14 2836  GLUCAP [place], MD 07/30/2014, 8:24 AM PGY-1, [place] Intern pager: [phone], text pages welcome

## 2014-07-30 NOTE — Progress Notes (Signed)
[facility] Admission Coordinator Signed Physical Medicine and Rehabilitation PMR Pre-admission 07/30/2014 10:05 AM  Related encounter: ED to Hosp-Admission (Discharged) from 07/24/2014 in [place]   PMR Admission Coordinator Pre-Admission Assessment  Patient: Carla Hopkins is an 64 y.o., female MRN: [id number] DOB: 1950/09/18 Height: 5' 2.5" (158.8 cm) Weight: 95.4 kg (210 lb 5.1 oz)  Insurance Information HMO: PPO: PCP: IPA: 80/20: OTHER: Blue Options PRIMARY: BCBS of [place] Policy#: [id number] Subscriber: self CM Name: Delight Stare, RN Phone#: [phone], ext. [phone] Fax#: [phone] Verbal approval given on 07-30-14 for seven days from 07-30-14 through 08-06-14 with updates due to [facility] on 08-06-14 at above fax  Pre-Cert#: [id number] Employer: [employer] Benefits: Phone #: [phone] Name: per [place]. Date: 05-27-14 Deduct: $2000 (met $325) Out of Pocket Max: $5350 (met $325) Life Max: none CIR: 80/20%, pre-auth needed SNF: 80/20% (120 days visit limits) Outpatient: 80/20% Co-Pay: no copay, no visit limits Home Health: 80/20% Co-Pay: none, 100 visit limits DME: 80/20% Co-Pay: none Providers: in network  Emergency Contact Information Contact Information    Name Relation Home Work Mobile   Richmond,Courtney Daughter [place]   [phone]   [facility] Neighbor   [phone]   Antoon,Nancy Neighbor   [phone]     Current Medical History  Patient Admitting Diagnosis: Multifocal patchy acute right MCA territory infarct  History of Present Illness: Carla Hopkins is a 64 y.o. right handed female with  history of tobacco abuse, hypertension. Patient lives alone independently prior to admission and working full time. Admitted 07/24/2014 after a fall noting left-sided weakness. No head trauma no loss of consciousness. MRI of the brain shows multifocal patchy acute right MCA territory infarcts. Additional 8 mm cortical infarct within the parasagittal cortical gray matter of the anterior right frontal lobe, likely right ACA distribution. MRA of the head with right MCA M2 inferior division occlusion. CT angiogram chest negative except for a 6 mm pulmonary nodule on the right with recommendation follow-up CT the chest in 6-12 months. Echocardiogram with ejection fraction of 70% and grade 1 diastolic dysfunction. Carotid Doppler with left 50-60% and right 80% ICA stenosis. Patient did not receive TPA. Vascular surgery Dr. Oneida Alar consulted in relation to ICA stenosis plan follow-up as an outpatient to proceed with possible CEA Neurology consulted presently on aspirin and Plavix for CVA prophylaxis. Subcutaneous heparin for DVT prophylaxis. Dysphagia 2 nectar thick liquid diet. Physical therapy evaluation completed with recommendations of physical medicine rehabilitation consult.  NIH Total: 9  Past Medical History  Past Medical History  Diagnosis Date  . Hypothyroidism   . Hypertension     Family History  family history is not on file. She was adopted.  Prior Rehab/Hospitalizations: none  Current Medications   Current facility-administered medications:  . antiseptic oral rinse (CPC / CETYLPYRIDINIUM CHLORIDE 0.05%) solution 7 mL, 7 mL, Mouth Rinse, BID, Kinnie Feil, MD, 7 mL at 07/28/14 2139 . aspirin chewable tablet 81 mg, 81 mg, Oral, Daily, Vivi Barrack, MD, 81 mg at 07/30/14 1005 . atorvastatin (LIPITOR) tablet 80 mg, 80 mg, Oral, q1800, Vivi Barrack, MD, 80 mg at 07/29/14 1748 . clopidogrel (PLAVIX) tablet 75 mg, 75 mg, Oral, Daily, Vivi Barrack, MD, 75 mg at  07/30/14 1005 . food thickener (THICK IT) powder, , Oral, PRN, Lind Covert, MD . heparin injection 5,000 Units, 5,000 Units, Subcutaneous, 3 times per  day, Vivi Barrack, MD, 5,000 Units at 07/30/14 9480 . hydrochlorothiazide (MICROZIDE) capsule 12.5 mg, 12.5 mg, Oral, Daily, Lind Covert, MD, 12.5 mg at 07/30/14 1005 . insulin aspart (novoLOG) injection 0-5 Units, 0-5 Units, Subcutaneous, QHS, Vivi Barrack, MD, 0 Units at 07/25/14 2200 . insulin aspart (novoLOG) injection 0-9 Units, 0-9 Units, Subcutaneous, TID WC, Vivi Barrack, MD, 2 Units at 07/30/14 [phone] . levothyroxine (SYNTHROID, LEVOTHROID) tablet 88 mcg, 88 mcg, Oral, QAC breakfast, Leone Haven, MD, 88 mcg at 07/30/14 [phone] . lisinopril (PRINIVIL,ZESTRIL) tablet 5 mg, 5 mg, Oral, Daily, Zenia Resides, MD, 5 mg at 07/30/14 1005 . metFORMIN (GLUCOPHAGE) tablet 500 mg, 500 mg, Oral, BID WC, Vivi Barrack, MD, 500 mg at 07/30/14 0814 . senna-docusate (Senokot-S) tablet 1 tablet, 1 tablet, Oral, QHS PRN, Leone Haven, MD . sodium chloride (OCEAN) 0.65 % nasal spray 1 spray, 1 spray, Each Nare, PRN, Zenia Resides, MD, 1 spray at 07/27/14 2118  Patients Current Diet: Diet regular Room service appropriate?: Yes; Fluid consistency:: Thin, full supervision with meals. Follow aspiration precautions at [facility]. Oral care after meals (clear any residue before reclining in bed or chair). Meds whole in applesauce.   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 She enjoys her four cats and has very supportive neighbors.   Home Assistive Devices / Equipment Home Assistive Devices/Equipment: Eyeglasses, CPAP Home Equipment: None  Prior Functional  Level Prior Function Level of Independence: Independent Comments: works for [employer]  Current Functional Level Cognition  Arousal/Alertness: Awake/alert Overall Cognitive Status: Impaired/Different from baseline Current Attention Level: Sustained Orientation Level: Oriented X4 Safety/Judgement: Decreased awareness of safety, Decreased awareness of deficits General Comments: L sided neglect Attention: Sustained Sustained Attention: Appears intact Memory: Appears intact Awareness: Appears intact Problem Solving: Appears intact (see clinical impression statement) Safety/Judgment: Appears intact   Extremity Assessment (includes Sensation/Coordination)  Upper Extremity Assessment: LUE deficits/detail LUE Deficits / Details: Brunstrum 0 forearm and distally; Brunstrum 1-elbow and proximally LUE Sensation: decreased light touch, decreased proprioception (can feel pain (pinch), but still not as strong as is on right arm--and pt is aware of this) LUE Coordination: decreased fine motor, decreased gross motor  Lower Extremity Assessment: LLE deficits/detail, RLE deficits/detail RLE Deficits / Details: AROM WFL, strength hip flexion 4-/5 due to pain, knee extension 4/5, ankle DF 4+/5 LLE Deficits / Details: AAROM WFL, strength hip flexion 3/5, knee extension 4-/5 ankle DF 3+/5    ADLs  Overall ADL's : Needs assistance/impaired Eating/Feeding: Set up, Supervision/ safety (swallowing precautions) Grooming: Moderate assistance, Sitting Upper Body Bathing: Moderate assistance, Sitting Lower Body Bathing: Maximal assistance (with Min A sit<>stand, but Mod A for dynamic balance) Upper Body Dressing : Moderate assistance, Sitting Lower Body Dressing: Total assistance (with Min A sit<>stand, but Mod A for dynamic balance) Toilet Transfer: Moderate assistance, Ambulation (Bil HHA with me infront of her holding onto gait belt with both UEs; recliner >bed>back to recliner with VC and  tactile cues for shifting weight) Toileting- Clothing Manipulation and Hygiene: Maximal assistance (with Min A sit<>stand, but Mod A for dynamic balance)    Mobility  Overal bed mobility: Needs Assistance Bed Mobility: Supine to Sit Supine to sit: Mod assist, HOB elevated General bed mobility comments: pt up in chair  Transfers  Overall transfer level: Needs assistance Equipment used: 1 person hand held assist Transfers: Sit to/from Stand Sit to Stand: Min assist Stand pivot transfers: Min assist General transfer comment: pt con't to require verbal and tactile cues for safety due to inattention on L side    Ambulation / Gait / Stairs / Wheelchair Mobility  Ambulation/Gait Ambulation/Gait assistance: Mod assist, +2 physical assistance Ambulation Distance (Feet): 100 Feet Assistive device: 1 person hand held assist General Gait Details: pt with noted L sided neglect, L LE decreased DF, decreased step height/dragging foot, L knee buckling/limp. pt with no proprioceptive input to self correct. max v/c's to make L knee straight and pick up foot to clear floor. PT provided tactile cueing at buttocks to promote stability to allow for better L LE gait kinematics while rehab tech provide physical support on R side. pt unable to use RW due to inability to grip with L hand. pt also impulsive requiring max v/c's to slow down to normal pace Gait Pattern/deviations: Step-through pattern, Decreased step length - left, Decreased stance time - left, Decreased dorsiflexion - left, Decreased weight shift to left Gait velocity: impulsively quick    Posture / Balance Dynamic Sitting Balance Sitting balance - Comments: accepts challenges, but reaching to left minguard for safety when outside base of support Balance Overall balance assessment: Needs assistance Sitting-balance support: No upper extremity supported, Feet supported Sitting balance-Leahy Scale: Good Sitting balance - Comments:  accepts challenges, but reaching to left minguard for safety when outside base of support Standing balance support: Bilateral upper extremity supported Standing balance-Leahy Scale: Poor Standing balance comment: worked on standing balance in front of mirror. Pt unable to self correct without verbal and tactile cues.    Special needs/care consideration BiPAP/CPAP - yes, uses CPAP at home CPM no Continuous Drip IV no  Dialysis no  Life Vest no  Oxygen no  Special Bed no  Trach Size no  Wound Vac (area) no  Skin - pt has a rash on bilateral heels and bruise on L foot per pt report  Bowel mgmt: last BM on 07-25-14 Bladder mgmt: currently using bedpan  Diabetic mgmt - newly diabetic, using insulin   Previous Home Environment Living Arrangements: Alone (daughter lives in [place]) Lives With: Alone Type of Home: House Home Layout: One level Home Access: Stairs to enter Entrance Stairs-Rails: None Entrance Stairs-Number of Steps: 3 Home Care Services: No  Discharge Living Setting Plans for Discharge Living Setting: Patient's home, Alone Type of Home at Discharge: House Discharge Home Layout: One level Discharge Home Access: Stairs to enter Entrance Stairs-Rails: (hand rail is being built per pt) Entrance Stairs-Number of Steps: 3 Does the patient have any problems obtaining your medications?: No  Social/Family/Support Systems Patient Roles: Other (Comment) (works full time at [employer]) [place]: dtr [name] is primary contact Anticipated Caregiver: Dtr [name] may return from [place] for a few days. In addition, pt's neighbors are very supportive. Anticipated Caregiver's Contact Information: see above Ability/Limitations of Caregiver: Dtr lives in [place] but may return for a few days when pt transitions to home. In addition, pt has several supportive neighbors. Caregiver Availability: Intermittent (Possible 24-7  supervision if dtr returns/from neighbors) Discharge Plan Discussed with Primary Caregiver: Yes Is Caregiver In Agreement with Plan?: Yes Does Caregiver/Family have Issues with Lodging/Transportation while Pt is in Rehab?: No   Note: pt's dtr lives in [place] and is here short term, returning to [place] soon. Dtr may be able to return  when pt is transitioned to home. Dtr shared her concerns over pt's eventual need to return to work at [employer] in regards to financial needs and pt's FMLA.  Goals/Additional Needs Patient/Family Goal for Rehab: Mod Ind and Supervision with PT/OT; Mod Ind with SLP Expected length of stay: 15-21 days Cultural Considerations: none Dietary Needs: Dys 2, nectar thick. Full supervision with meals, follow aspiration precautions at [facility]. Oral care after meals (clear any residue before reclining in bed or chair). Meds whole in applesauce. Equipment Needs: to be determined Pt/Family Agrees to Admission and willing to participate: Yes (spoke with pt and her dtr) Program Orientation Provided & Reviewed with Pt/Caregiver Including Roles & Responsibilities: Yes   Decrease burden of Care through IP rehab admission: NA   Possible need for SNF placement upon discharge: not anticipated  Patient Condition: This patient's condition remains as documented in the consult dated 07-29-14, in which the Rehabilitation Physician determined and documented that the patient's condition is appropriate for intensive rehabilitative care in an inpatient rehabilitation facility. Will admit to inpatient rehab today.  Preadmission Screen Completed By: Nanetta Batty, PT, 07/30/2014 11:26 AM ______________________________________________________________________  Discussed status with Dr. Naaman Plummer on 07-30-14 at 1126 and received telephone approval for admission today.  Admission Coordinator: Nanetta Batty, PT, time 1126/Date 07-30-14          Cosigned by: Meredith Staggers, MD at  07/30/2014 11:36 AM  Revision History     Date/Time User Provider Type Action   07/30/2014 11:36 AM Meredith Staggers, MD Physician Cosign   07/30/2014 11:32 AM [facility] Admission Coordinator Sign

## 2014-07-30 NOTE — Progress Notes (Signed)
Patient arrived from [facility] with RN, family and belongings. Oriented to room, fall prevention plan, safety plan, health resource notebook, rehab process, and rehab schedule with verbal understanding. Patient resting comfortably in bed with no c/o of pain.

## 2014-07-30 NOTE — Interval H&P Note (Signed)
Carla Hopkins was admitted today to Inpatient Rehabilitation with the diagnosis of Right MCA infarct.  The patient's history has been reviewed, patient examined, and there is no change in status.  Patient continues to be appropriate for intensive inpatient rehabilitation.  I have reviewed the patient's chart and labs.  Questions were answered to the patient's satisfaction.  Burt Piatek T 07/30/2014, 7:36 PM

## 2014-07-30 NOTE — Progress Notes (Signed)
Rehab admissions - I met with pt and her dtr to share that we are waiting on determination from [facility]. Further questions were answered and I will keep the pt/family and medical team aware of any updates.   I updated Hassan Rowan, case Freight forwarder and Dysheka, Education officer, museum as well. Thanks.  Nanetta Batty, PT Rehabilitation Admissions Coordinator [phone]'

## 2014-07-30 NOTE — Progress Notes (Signed)
Occupational Therapy Treatment Patient Details Name: Carla Hopkins MRN: [id number] DOB: 07-23-1950 Today's Date: 07/30/2014    History of present illness Patient is a 64 y/o female admitted with left sided weakness postive for right MCA CVA.  PMH positive for HLD, HTN, DM and hypothyroidism.   OT comments  Pt demonstrates L inattention and L decr sensation with adl task this session. Pt when cued able to scan L and provide verbal feed back ( that is my daughter). Pt during functional task leaving objects on L and poor sequencing of adl task. Pt remains strong CIR candidate at this time.    Follow Up Recommendations  CIR    Equipment Recommendations   (defer)    Recommendations for Other Services      Precautions / Restrictions Precautions Precautions: Fall Precaution Comments: severe L sided neglect       Mobility Bed Mobility               General bed mobility comments: in chair on arrival  Transfers Overall transfer level: Needs assistance Equipment used: Rolling walker (2 wheeled) Transfers: Sit to/from Stand Sit to Stand: Min assist         General transfer comment: (A) to manage L UE and lack of awareness to risk to L UE    Balance Overall balance assessment: Needs assistance         Standing balance support: Bilateral upper extremity supported;During functional activity Standing balance-Leahy Scale: Poor Standing balance comment: L lateral lean with decr awareness                   ADL Overall ADL's : Needs assistance/impaired     Grooming: Minimal assistance;Sitting Grooming Details (indicate cue type and reason): pt holding top to container in R hand and unaware of hand over hand (A) to hold the base in the L UE. pt looking to the R stating "where is it?"  pt provided min, mod and finally max cue ( look at L hand on the L side) Pt states "its not nice to hide things" Upper Body Bathing: Moderate assistance;Sitting Upper Body Bathing  Details (indicate cue type and reason): requires (A) to clean entire L side Lower Body Bathing: Moderate assistance;Sit to/from stand   Upper Body Dressing : Minimal assistance;Sitting Upper Body Dressing Details (indicate cue type and reason): v/c to dress L side first                 Functional mobility during ADLs: Moderate assistance;Rolling walker General ADL Comments: pt unable to grasp RW with L UE deficits. Pt coudl benefit from walker splint if grasp does not improve. pt with no sensation from elbow down . Pt demonstrates shoulder flexion, extension abduction this session with verbal visual and auditory cues. pt with much improvement with visual input. Pt demonstrates errors with oral care sequence with lack of awareness to no tooth paste even with max cues and guided questioning cues       Vision                     Perception     Praxis      Cognition   Behavior During Therapy: Impulsive Overall Cognitive Status: Impaired/Different from baseline Area of Impairment: Attention;Safety/judgement;Awareness;Problem solving;Memory   Current Attention Level: Sustained Memory: Decreased short-term memory    Safety/Judgement: Decreased awareness of safety;Decreased awareness of deficits Awareness: Emergent Problem Solving: Slow processing;Difficulty sequencing;Requires verbal cues;Requires tactile cues General Comments: L sided neglect  Extremity/Trunk Assessment               Exercises Other Exercises Other Exercises: educated on using L UE during functional task when ever possible and several ADL task used during session to help with reinforcement of education ( teach back)   Shoulder Instructions       General Comments      Pertinent Vitals/ Pain       Pain Assessment: No/denies pain  Home Living                                          Prior Functioning/Environment              Frequency Min 3X/week     Progress  Toward Goals  OT Goals(current goals can now be found in the care plan section)  Progress towards OT goals: Progressing toward goals  Acute Rehab OT Goals Patient Stated Goal: rehab then home OT Goal Formulation: With patient Time For Goal Achievement: 08/03/14 Potential to Achieve Goals: Good ADL Goals Pt Will Perform Grooming: with min guard assist;sitting Pt Will Perform Upper Body Bathing: with min guard assist;sitting Pt Will Perform Lower Body Bathing: with mod assist Pt Will Perform Upper Body Dressing: with min assist;sitting Pt Will Perform Lower Body Dressing: with mod assist Pt Will Transfer to Toilet: with min assist;ambulating Pt Will Perform Toileting - Clothing Manipulation and hygiene: with mod assist Additional ADL Goal #1: Pt will be able to complete saccadic eye activities with S Additional ADL Goal #2: Pt will be min A to come up to sit EOB with HOB flat and no rail Additional ADL Goal #3: Pt will be independent with LUE exercises  Plan Discharge plan remains appropriate    Co-evaluation                 End of Session Equipment Utilized During Treatment: Gait belt;Rolling walker   Activity Tolerance Patient tolerated treatment well   Patient Left in chair;with call bell/phone within reach;with family/visitor present   Nurse Communication Mobility status;Precautions        Time: 9163-8466 OT Time Calculation (min): 23 min  Charges: OT General Charges $OT Visit: 1 Procedure OT Treatments $Self Care/Home Management : 23-37 mins  Parke Poisson B 07/30/2014, 2:25 PM Pager: [phone]

## 2014-07-30 NOTE — Progress Notes (Signed)
Speech Language Pathology Treatment: Dysphagia  Patient Details Name: Sylvi Rybolt MRN: [id number] DOB: 08-04-1950 Today's Date: 07/30/2014 Time: 2010-0712 SLP Time Calculation (min) (ACUTE ONLY): 14 min  Assessment / Plan / Recommendation Clinical Impression  Treatment focused on readiness to advance diet. Patient appearing stronger and with less dysarthria, improving left sided oral weakness. Trials of both regular texture solids and thin liquids provided with ability to consume with good appearing airway protection, min cues for use of lingual sweep to clear trace left sided buccal pocketing. Patient appears ready to advance diet. Will advance and f/u to monitor for tolerance.    HPI Other Pertinent Information: 64 year old female adm with left sided weakness and dysarthria.  Brain CT reveals a Right MCA CVA.  Incidental findings of pulmonary nodules (on CT).  PMH:  HTN   Pertinent Vitals    SLP Plan  Continue with current plan of care    Recommendations Diet recommendations: Regular;Thin liquid Liquids provided via: Cup;Straw Medication Administration: Whole meds with liquid Supervision: Patient able to self feed;Intermittent supervision to cue for compensatory strategies Compensations: Slow rate;Small sips/bites;Check for pocketing;Check for anterior loss Postural Changes and/or Swallow Maneuvers: Seated upright 90 degrees              Plan: Continue with current plan of care    [place], [place] [phone]   Jaquila Santelli Meryl 07/30/2014, 10:29 AM

## 2014-07-31 ENCOUNTER — Inpatient Hospital Stay (HOSPITAL_COMMUNITY): Payer: BLUE CROSS/BLUE SHIELD | Admitting: Occupational Therapy

## 2014-07-31 ENCOUNTER — Inpatient Hospital Stay (HOSPITAL_COMMUNITY): Payer: BLUE CROSS/BLUE SHIELD

## 2014-07-31 ENCOUNTER — Inpatient Hospital Stay (HOSPITAL_COMMUNITY): Payer: BLUE CROSS/BLUE SHIELD | Admitting: Speech Pathology

## 2014-07-31 DIAGNOSIS — I63311 Cerebral infarction due to thrombosis of right middle cerebral artery: Secondary | ICD-10-CM

## 2014-07-31 DIAGNOSIS — M6289 Other specified disorders of muscle: Secondary | ICD-10-CM

## 2014-07-31 DIAGNOSIS — I10 Essential (primary) hypertension: Secondary | ICD-10-CM

## 2014-07-31 LAB — COMPREHENSIVE METABOLIC PANEL
ALT: 24 U/L (ref 14–54)
ANION GAP: 12 (ref 5–15)
AST: 25 U/L (ref 15–41)
Albumin: 3.2 g/dL — ABNORMAL LOW (ref 3.5–5.0)
Alkaline Phosphatase: 77 U/L (ref 38–126)
BUN: 17 mg/dL (ref 6–20)
CALCIUM: 9.1 mg/dL (ref 8.9–10.3)
CO2: 23 mmol/L (ref 22–32)
CREATININE: 0.9 mg/dL (ref 0.44–1.00)
Chloride: 103 mmol/L (ref 101–111)
GFR calc Af Amer: >60 mL/min (ref >60)
GFR calc non Af Amer: >60 mL/min (ref >60)
Glucose, Bld: 168 mg/dL — ABNORMAL HIGH (ref 70–99)
Potassium: 4 mmol/L (ref 3.5–5.1)
Sodium: 138 mmol/L (ref 135–145)
Total Bilirubin: 0.8 mg/dL (ref 0.3–1.2)
Total Protein: 6.2 g/dL — ABNORMAL LOW (ref 6.5–8.1)

## 2014-07-31 LAB — CBC WITH DIFFERENTIAL/PLATELET
BASOS ABS: 0.1 10*3/uL (ref 0.0–0.1)
BASOS PCT: 1 % (ref 0–1)
EOS PCT: 5 % (ref 0–5)
Eosinophils Absolute: 0.6 10*3/uL (ref 0.0–0.7)
HCT: 45.5 % (ref 36.0–46.0)
Hemoglobin: 15.2 g/dL — ABNORMAL HIGH (ref 12.0–15.0)
LYMPHS PCT: 21 % (ref 12–46)
Lymphs Abs: 2.6 10*3/uL (ref 0.7–4.0)
MCH: 29 pg (ref 26.0–34.0)
MCHC: 33.4 g/dL (ref 30.0–36.0)
MCV: 86.7 fL (ref 78.0–100.0)
Monocytes Absolute: 1 10*3/uL (ref 0.1–1.0)
Monocytes Relative: 8 % (ref 3–12)
Neutro Abs: 8 10*3/uL — ABNORMAL HIGH (ref 1.7–7.7)
Neutrophils Relative %: 65 % (ref 43–77)
Platelets: 257 10*3/uL (ref 150–400)
RBC: 5.25 MIL/uL — ABNORMAL HIGH (ref 3.87–5.11)
RDW: 15.5 % (ref 11.5–15.5)
WBC: 12.1 10*3/uL — ABNORMAL HIGH (ref 4.0–10.5)

## 2014-07-31 LAB — GLUCOSE, CAPILLARY
Glucose-Capillary: 158 mg/dL — ABNORMAL HIGH (ref 70–99)
Glucose-Capillary: 147 mg/dL — ABNORMAL HIGH (ref 70–99)
Glucose-Capillary: 136 mg/dL — ABNORMAL HIGH (ref 70–99)
Glucose-Capillary: 155 mg/dL — ABNORMAL HIGH (ref 70–99)

## 2014-07-31 MED ORDER — DEXTROSE 5 % IV SOLN
1.5000 g | INTRAVENOUS | Status: DC
  Filled 2014-07-31: qty 1.5

## 2014-07-31 NOTE — Evaluation (Signed)
Speech Language Pathology Assessment and Plan  Patient Details  Name: Carla Hopkins MRN: [id number] Date of Birth: [birth year]  SLP Diagnosis: Dysarthria;Dysphagia;Cognitive Impairments  Rehab Potential: Good ELOS: 14-21 days     Today's Date: 07/31/2014 SLP Individual Time: 1330-1430 SLP Individual Time Calculation (min): 60 min   Problem List:  Patient Active Problem List   Diagnosis Date Noted  . Acute right MCA stroke 07/30/2014  . Cerebral infarction due to thrombosis of right middle cerebral artery   . Thyroid activity decreased   . Left-sided weakness   . Stroke   . Essential hypertension   . Carotid artery stenosis   . CVA (cerebral infarction) 07/24/2014  . Hyperlipidemia 07/24/2014  . Diabetes 07/24/2014  . Elevated troponin 07/24/2014  . Tobacco abuse 07/24/2014  . Benign essential HTN 07/23/2014  . Obstructive sleep apnea 07/23/2014  . Hypothyroidism 07/03/2014  . Type II or unspecified type diabetes mellitus without mention of complication, not stated as uncontrolled 09/25/2013  . BMI 38.0-38.9,adult 05/19/2013  . Nicotine addiction 05/19/2013  . Right atrial enlargement 05/19/2013   Past Medical History:  Past Medical History  Diagnosis Date  . Hypothyroidism   . Hypertension    Past Surgical History:  Past Surgical History  Procedure Laterality Date  . Tubal ligation      Assessment / Plan / Recommendation Clinical Impression   Carla Hopkins is a 64 y.o. right handed female with history of tobacco abuse, hypertension. Patient lives alone independently prior to admission and working full time. Admitted 07/24/2014 after a fall noting left-sided weakness.  MRI of the brain shows multifocal patchy acute right MCA territory infarcts. Additional 8 mm cortical infarct within the parasagittal cortical gray matter of the anterior right frontal lobe, likely right ACA distribution. Patient was admitted for a comprehensive rehabilitation program on 07/30/2014.  SLP  evaluation was completed on 07/31/2014 with the following results: Pt presents with mild-moderate cognitive deficits characterized by decreased emergent awareness of deficits, left inattention, and decreased functional problem solving.  Pt also exhibits a mild dysarthria due to left sided labial and lingual weakness though she is completely intelligible in conversations.  Pt demonstrates a mild orally based dysphagia characterized by left sided buccal residue left in the oral cavity post swallow due to the abovementioned oral motor deficits.  No overt s/s of aspiration were noted with solids or liquids and pt verbalized 3 compensatory strategies to monitor and correct oral residue.   Pt was completely independent prior to admission and has experienced a significant loss of function as a result of her acute CVA.  Therefore, pt would benefit from skilled ST while inpatient in order to maximize functional independence and reduce burden of care prior to discharge.  At this point, SLP recommends that pt have 24/7 supervision due to cognitive deficits; however, pt reports that she only has intermittent supervision available at discharge.  Pt and pt's daughter aware of SLP's recommendations.    Skilled Therapeutic Interventions          Cognitive-linguistic and bedside swallowing evaluation completed with results and recommendations reviewed with patient and family.     SLP Assessment  Patient will need skilled Speech Lanaguage Pathology Services during CIR admission    Recommendations  SLP Diet Recommendations: Age appropriate regular solids;Thin Liquid Administration via: Cup;Straw Medication Administration: Whole meds with liquid Supervision: Patient able to self feed;Intermittent supervision to cue for compensatory strategies Compensations: Slow rate;Small sips/bites;Check for pocketing;Check for anterior loss Postural Changes and/or Swallow Maneuvers: Seated upright  90 degrees Oral Care Recommendations:  Oral care BID Patient destination: Home Follow up Recommendations: Home Health SLP;24 hour supervision/assistance Equipment Recommended: None recommended by SLP    SLP Frequency 3 to 5 out of 7 days   SLP Treatment/Interventions Cognitive remediation/compensation;Cueing hierarchy;Dysphagia/aspiration precaution training;Patient/family education;Speech/Language facilitation;Oral motor exercises;Internal/external aids;Environmental controls    Pain Pain Assessment Pain Assessment: No/denies pain Prior Functioning Cognitive/Linguistic Baseline: Within functional limits Type of Home: House  Lives With: Alone Available Help at Discharge: Friend(s);Available PRN/intermittently Vocation: Full time employment  Short Term Goals: Week 1: SLP Short Term Goal 1 (Week 1): Pt will attend to left body/environment during semi-complex functional tasks with min assist verbal cues SLP Short Term Goal 2 (Week 1): Pt will complete semi-complex tasks with min assist verbal cues for functional problem solving.   SLP Short Term Goal 3 (Week 1): Pt will return demonstration of oral motor strengthening exercises targeting left sided weakness with supervision.  SLP Short Term Goal 4 (Week 1): Pt will utilize slow rate and overarticulation to achieve intelligibility in conversations with supervision.  SLP Short Term Goal 5 (Week 1): Pt will consume regular textures and thin liquids with mod I self monitoring and correction of left sided buccal residue.    See FIM for current functional status Refer to Care Plan for Long Term Goals  Recommendations for other services: None  Discharge Criteria: Patient will be discharged from SLP if patient refuses treatment 3 consecutive times without medical reason, if treatment goals not met, if there is a change in medical status, if patient makes no progress towards goals or if patient is discharged from hospital.  The above assessment, treatment plan, treatment alternatives  and goals were discussed and mutually agreed upon: by patient and by family  Deryk Bozman, Selinda Orion 07/31/2014, 4:22 PM

## 2014-07-31 NOTE — Progress Notes (Signed)
64 y.o. right handed female with history of tobacco abuse, hypertension. Patient lives alone independently prior to admission and working full time. Admitted 07/24/2014 after a fall noting left-sided weakness. No head trauma no loss of consciousness. MRI of the brain shows multifocal patchy acute right MCA territory infarcts. Additional 8 mm cortical infarct within the parasagittal cortical gray matter of the anterior right frontal lobe, likely right ACA distribution. MRA of the head with right MCA M2 inferior division occlusion. CT angiogram chest negative except for a 6 mm pulmonary nodule on the right with recommendation follow-up CT the chest in 6-12 months. Elevated troponin 4.17. Echocardiogram with ejection fraction of 70% and grade 1 diastolic dysfunction. Carotid Doppler with left 50-60% and right 80% ICA stenosis. Patient did not receive TPA. Vascular surgery Dr. Oneida Alar consulted in relation to ICA stenosis plan follow-up as an outpatient to proceed with possible CEA. Follow-up cardiology for elevated troponin not felt to be due to ACS per Dr. Burt Knack and no further workup was indicated. Neurology consulted presently on aspirin and Plavix for CVA prophylaxis for 3 months then Plavix alone   Subjective/Complaints: Movements left leg pretty well, left arm is still weak, took shower with OT today  Review of systems positive for left upper extremity weakness, no bowel or bladder difficulties, no breathing problems, otherwise negative  Objective: Vital Signs: Blood pressure 107/69, pulse 63, temperature 98.4 F (36.9 C), temperature source Axillary, resp. rate 20, height 5' 2.5" (1.588 m), weight 94 kg (207 lb 3.7 oz), SpO2 96 %. No results found. Results for orders placed or performed during the hospital encounter of 07/30/14 (from the past 72 hour(s))  CBC     Status: Abnormal   Collection Time: 07/30/14  4:05 PM  Result Value Ref Range   WBC 13.1 (H) 4.0 - 10.5 K/uL   RBC 5.32 (H) 3.87 -  5.11 MIL/uL   Hemoglobin 15.4 (H) 12.0 - 15.0 g/dL   HCT 45.9 36.0 - 46.0 %   MCV 86.3 78.0 - 100.0 fL   MCH 28.9 26.0 - 34.0 pg   MCHC 33.6 30.0 - 36.0 g/dL   RDW 15.5 11.5 - 15.5 %   Platelets 291 150 - 400 K/uL  Creatinine, serum     Status: None   Collection Time: 07/30/14  4:05 PM  Result Value Ref Range   Creatinine, Ser 0.92 0.44 - 1.00 mg/dL   GFR calc non Af Amer >60 >60 mL/min   GFR calc Af Amer >60 >60 mL/min    Comment: (NOTE) The eGFR has been calculated using the CKD EPI equation. This calculation has not been validated in all clinical situations. eGFR's persistently <90 mL/min signify possible Chronic Kidney Disease.   Glucose, capillary     Status: Abnormal   Collection Time: 07/30/14  5:15 PM  Result Value Ref Range   Glucose-Capillary 149 (H) 70 - 99 mg/dL  Glucose, capillary     Status: Abnormal   Collection Time: 07/30/14  9:03 PM  Result Value Ref Range   Glucose-Capillary 134 (H) 70 - 99 mg/dL  Glucose, capillary     Status: Abnormal   Collection Time: 07/31/14  6:57 AM  Result Value Ref Range   Glucose-Capillary 158 (H) 70 - 99 mg/dL  CBC WITH DIFFERENTIAL     Status: Abnormal   Collection Time: 07/31/14  7:16 AM  Result Value Ref Range   WBC 12.1 (H) 4.0 - 10.5 K/uL   RBC 5.25 (H) 3.87 - 5.11 MIL/uL  Hemoglobin 15.2 (H) 12.0 - 15.0 g/dL   HCT 45.5 36.0 - 46.0 %   MCV 86.7 78.0 - 100.0 fL   MCH 29.0 26.0 - 34.0 pg   MCHC 33.4 30.0 - 36.0 g/dL   RDW 15.5 11.5 - 15.5 %   Platelets 257 150 - 400 K/uL   Neutrophils Relative % 65 43 - 77 %   Neutro Abs 8.0 (H) 1.7 - 7.7 K/uL   Lymphocytes Relative 21 12 - 46 %   Lymphs Abs 2.6 0.7 - 4.0 K/uL   Monocytes Relative 8 3 - 12 %   Monocytes Absolute 1.0 0.1 - 1.0 K/uL   Eosinophils Relative 5 0 - 5 %   Eosinophils Absolute 0.6 0.0 - 0.7 K/uL   Basophils Relative 1 0 - 1 %   Basophils Absolute 0.1 0.0 - 0.1 K/uL     HEENT: normal Cardio: RRR and no murmurs Resp: CTA B/L and unlabored GI: BS  positive and nontender nondistended Extremity:  Pulses positive and No Edema Skin:   Intact Neuro: Alert/Oriented, Cranial Nerve II-XII normal, Abnormal Sensory reduced left upper extremity,  does withdraw weakly to pinch, Abnormal Motor 2 minus biceps as well as shoulder retraction, trace abduction, 0 at the wrist and fingers,  left lower extremity 4/5 and hip flexor and extensor ankle dorsi flexor and Other , there is no evidence of neglect Musc/Skel:  Other no pain with left shoulder range of motion Gen. no acute distress   Assessment/Plan: 1. Functional deficits secondary to right MCA infarct causing left hemiparesis upper greater than lower which require 3+ hours per day of interdisciplinary therapy in a comprehensive inpatient rehab setting. Physiatrist is providing close team supervision and 24 hour management of active medical problems listed below. Physiatrist and rehab team continue to assess barriers to discharge/monitor patient progress toward functional and medical goals. FIM:                   Comprehension Comprehension Mode: Auditory Comprehension: 7-Follows complex conversation/direction: With no assist  Expression Expression Mode: Verbal Expression: 7-Expresses complex ideas: With no assist  Social Interaction Social Interaction: 7-Interacts appropriately with others - No medications needed.  Problem Solving Problem Solving: 6-Solves complex problems: With extra time  Memory Memory: 7-Complete Independence: No helper  Medical Problem List and Plan: 1. Functional deficits secondary to right MCA infarct 2.  DVT Prophylaxis/Anticoagulation: Subcutaneous heparin. Monitor platelet counts and any signs of bleeding 3. Pain Management: Tylenol as needed 4. Dysphagia. Dysphagia #2 nectar liquids. Monitor for any aspiration. Follow-up speech therapy 5. Neuropsych: This patient is capable of making decisions on her own behalf. 6. Skin/Wound Care: Routine skin  checks 7. Fluids/Electrolytes/Nutrition: Strict I and O's with follow-up chemistries 8. Hypertension. Hydrochlorothiazide 12.5 mg daily, lisinopril 5 mg daily. Monitor with increased mobility 9. Right ICA stenosis. Follow-up vascular surgery Dr. Oneida Alar to address CEA in 2-3 weeks 10. 6 mm pulmonary nodule identified on CT angiogram of chest. Recommendations follow-up CT of the chest 6-12 months 11. New findings diabetes mellitus. Hemoglobin A1c 8.5. Glucophage 500 mg twice a day. Check blood sugars before meals and at bedtime. Provide diabetic teaching 12. Hypothyroidism. Synthroid. TSH 2.151 13. Hyperlipidemia. Lipitor   LOS (Days) 1 A FACE TO FACE EVALUATION WAS PERFORMED  KIRSTEINS,ANDREW E 07/31/2014, 8:28 AM

## 2014-07-31 NOTE — Progress Notes (Signed)
Social Work Assessment and Plan Social Work Assessment and Plan  Patient Details  Name: Carla Hopkins MRN: [id number] Date of Birth: [birth year]  Today's Date: 07/31/2014  Problem List:  Patient Active Problem List   Diagnosis Date Noted  . Acute right MCA stroke 07/30/2014  . Cerebral infarction due to thrombosis of right middle cerebral artery   . Thyroid activity decreased   . Left-sided weakness   . Stroke   . Essential hypertension   . Carotid artery stenosis   . CVA (cerebral infarction) 07/24/2014  . Hyperlipidemia 07/24/2014  . Diabetes 07/24/2014  . Elevated troponin 07/24/2014  . Tobacco abuse 07/24/2014  . Benign essential HTN 07/23/2014  . Obstructive sleep apnea 07/23/2014  . Hypothyroidism 07/03/2014  . Type II or unspecified type diabetes mellitus without mention of complication, not stated as uncontrolled 09/25/2013  . BMI 38.0-38.9,adult 05/19/2013  . Nicotine addiction 05/19/2013  . Right atrial enlargement 05/19/2013   Past Medical History:  Past Medical History  Diagnosis Date  . Hypothyroidism   . Hypertension    Past Surgical History:  Past Surgical History  Procedure Laterality Date  . Tubal ligation     Social History:  reports that she has been smoking Cigarettes.  She has been smoking about 20.00 packs per day. She does not have any smokeless tobacco history on file. She reports that she drinks alcohol. She reports that she does not use illicit drugs.  Family / Support Systems Marital Status: Divorced Patient Roles: Parent, Other (Comment) [facility]) Children: Carla Hopkins-daughter  [phone] Other Supports: Carla Hopkins  [phone]-cell    Carla Hopkins-neighbor-[phone]-cell Anticipated Caregiver: daughter hopes to return for a few days upon discharge.  Neighbors can check on her Ability/Limitations of Caregiver: See above-daughter to hopefully come back for a few days upon discharge Caregiver Availability:  Intermittent Family Dynamics: Close with daughter just lives so far away.  Daughter tries to see Mom yearly and has offered for Mom to come out to [place] to visit them.  Social History Preferred language: English Religion:  Cultural Background: No issues Education: Some college Read: Yes Write: Yes Employment Status: Employed Name of Employer: [employer] Return to Work Plans: Plans to return once recovered from stroke Legal Hisotry/Current Legal Issues: No issues Guardian/Conservator: None-according to MD pt is capable of making her own decisions while here.   Abuse/Neglect Physical Abuse: Denies Verbal Abuse: Denies Sexual Abuse: Denies Exploitation of patient/patient's resources: Denies Self-Neglect: Denies  Emotional Status Pt's affect, behavior adn adjustment status: Pt is motivated to improve and regain her independence.  She needs to return to work due to financial worries if she can't. She is encouraged by the progress she has made already but wishes her arm would show some improvements. Recent Psychosocial Issues: other health issues-thought they were being managed until this stroke. Pyschiatric History: No history-deferred depression screen due to feels she is doing well with all of this.  She does worry about having another stroke due to she needs CEA surgery.  Will monitor through therapies and see if would benefit from Neuro-psych seeing especially if she doesn't reach the goals she has set for herself. Substance Abuse History: Tobacco-realizes she needs to quit and plans too.  Would like to speak to a cessation counselor  Patient / Family Perceptions, Expectations & Goals Pt/Family understanding of illness & functional limitations: Pt and daughter have a good understanding of her stroke and deficits.  They both have spoken with the MD's involved and feel their questions are being  answered.  Daughter does have questions whether Mom's other CEA is blocked.  Referred to RN for  information regarding this. 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, employee, home owner, neighbor, friend, etc Anticipated changes in roles/activities/participation: resume Pt/family expectations/goals: Pt states: " I need to be independent before I leave here, I have no caregiver."  Daughter states: " I am hopeful she can stay by herself, due to I have to go back to [place]."  [employer]: None Premorbid Home Care/DME Agencies: None Transportation available at discharge: Friends Resource referrals recommended: Neuropsychology, Support group (specify)  Discharge Planning Living Arrangements: Alone Support Systems: Children, Friends/neighbors Type of Residence: Private residence Administrator, sports: Multimedia programmer (specify) Nurse, mental health) Financial Resources: Employment Financial Screen Referred: No Living Expenses: Own Money Management: Patient Does the patient have any problems obtaining your medications?: No Home Management: patient Patient/Family Preliminary Plans: Return home with intermittent assist from neighbors and friends.  Daughter is hopeful she can return for a few days when pt is discharged from here. Pt needs to be mod/i levle before she can return home she does not have a caregiver.  Will see what team thinks and if reachable goals. Social Work Anticipated Follow Up Needs: HH/OP, Support Group  Clinical Impression Pleasant motivated patient who is willing to do what she needs to do to get home. Daughter is here for a few days from [place] but plans to leave and come back when Mom discharged. Hope pt can reach mod/i level so she is safe to return home with neighbors assisting at discharge.  Will await team's evaluations.  Elease Hashimoto 07/31/2014, 1:28 PM

## 2014-07-31 NOTE — Evaluation (Addendum)
Physical Therapy Assessment and Plan  Patient Details  Name: Carla Hopkins MRN: [id number] Date of Birth: December 15, 1950  PT Diagnosis: Abnormality of gait, Cognitive deficits, Hemiparesis non-dominant, Hypertonia and Impaired sensation Rehab Potential: Good ELOS: 18-21 days   Today's Date: 07/31/2014 PT Individual Time:1005-1105; 1500-1535 60 min, 35 min  -       Problem List:  Patient Active Problem List   Diagnosis Date Noted  . Acute right MCA stroke 07/30/2014  . Cerebral infarction due to thrombosis of right middle cerebral artery   . Thyroid activity decreased   . Left-sided weakness   . Stroke   . Essential hypertension   . Carotid artery stenosis   . CVA (cerebral infarction) 07/24/2014  . Hyperlipidemia 07/24/2014  . Diabetes 07/24/2014  . Elevated troponin 07/24/2014  . Tobacco abuse 07/24/2014  . Benign essential HTN 07/23/2014  . Obstructive sleep apnea 07/23/2014  . Hypothyroidism 07/03/2014  . Type II or unspecified type diabetes mellitus without mention of complication, not stated as uncontrolled 09/25/2013  . BMI 38.0-38.9,adult 05/19/2013  . Nicotine addiction 05/19/2013  . Right atrial enlargement 05/19/2013    Past Medical History:  Past Medical History  Diagnosis Date  . Hypothyroidism   . Hypertension    Past Surgical History:  Past Surgical History  Procedure Laterality Date  . Tubal ligation      Assessment & Plan Clinical Impression:  Carla Hopkins is a 64 y.o. right handed female with history of tobacco abuse, hypertension. Patient lives alone independently prior to admission and working full time. Admitted 07/24/2014 after a fall noting left-sided weakness. No head trauma no loss of consciousness. MRI of the brain shows multifocal patchy acute right MCA territory infarcts. Additional 8 mm cortical infarct within the parasagittal cortical gray matter of the anterior right frontal lobe, likely right ACA distribution. MRA of the head with right  MCA M2 inferior division occlusion. CT angiogram chest negative except for a 6 mm pulmonary nodule on the right with recommendation follow-up CT the chest in 6-12 months. Elevated troponin 4.17. Echocardiogram with ejection fraction of 70% and grade 1 diastolic dysfunction. Carotid Doppler with left 50-60% and right 80% ICA stenosis. Patient did not receive TPA. Vascular surgery Dr. Oneida Alar consulted in relation to ICA stenosis plan follow-up as an outpatient to proceed with possible CEA. Follow-up cardiology for elevated troponin not felt to be due to ACS per Dr. Burt Knack and no further workup was indicated. Neurology consulted presently on aspirin and Plavix for CVA prophylaxis for 3 months then Plavix alone. Subcutaneous heparin for DVT prophylaxis. Findings of elevated hemoglobin A1c of 8.5 and placed on Glucophage with sliding scale insulin. Dysphagia 2 nectar thick liquid diet with follow per speech therapy.  Patient transferred to CIR on 07/30/2014 .   Patient currently requires total with mobility secondary to decreased cardiorespiratoy endurance, impaired timing and sequencing and abnormal tone and decreased attention, decreased awareness, decreased problem solving and decreased safety awareness.  Prior to hospitalization, patient was independent  with mobility and lived with Alone in a House home.  Home access is 3Stairs to enter, no rails.  Pt says she is having rails installed.  Patient will benefit from skilled PT intervention to maximize safe functional mobility, minimize fall risk and decrease caregiver burden for planned discharge home alone.  Anticipate patient will benefit from follow up [place] at discharge.  Pt plans to d/c to home alone, with neighbors looking in on her and providing meals, but this may not be a  viable, safe d/c depending upon pt's progress in attention, impulsivity, L awareness, etc.  Her dtr will be returning to [place] in a couple of days. PT - End of Session Activity Tolerance:  Tolerates 10 - 20 min activity with multiple rests Endurance Deficit: Yes Endurance Deficit Description: DOE 2/4 PT Assessment Rehab Potential (ACUTE/IP ONLY): Good Barriers to Discharge: Decreased caregiver support PT Patient demonstrates impairments in the following area(s): Balance;Behavior;Endurance;Motor;Sensory PT Transfers Functional Problem(s): Bed Mobility;Bed to Chair;Car;Furniture PT Locomotion Functional Problem(s): Stairs;Wheelchair Mobility;Ambulation PT Plan PT Intensity: Minimum of 1-2 x/day ,45 to 90 minutes PT Frequency: 5 out of 7 days PT Duration Estimated Length of Stay: 18-21 days PT Treatment/Interventions: Ambulation/gait training;Balance/vestibular training;Cognitive remediation/compensation;Discharge planning;Community reintegration;DME/adaptive equipment instruction;Functional mobility training;Patient/family education;Neuromuscular re-education;Psychosocial support;Splinting/orthotics;Therapeutic Exercise;Therapeutic Activities;Stair training;UE/LE Strength taining/ROM;UE/LE Coordination activities;Wheelchair propulsion/positioning;Visual/perceptual remediation/compensation PT Transfers Anticipated Outcome(s): modified independent basic and furniture; Bowlus car PT Locomotion Anticipated Outcome(s): modified independent gait 50' home setting, superviison 150' controlled and community; supervision w/c 150' controlled PT Recommendation Follow Up Recommendations: Home health PT;Outpatient PT Patient destination: Home Equipment Recommended: To be determined Skilled Therapeutic Intervention-   tx 1: eval completed.  neuromuscular re-education via demo, forced use, VCs for trunk shortening/lengthening/rotating in unsupported sitting to facilitate trunk righting reactions.  Blocked practice sit>< stand from low surface, working on LLE awareness/position to decrease chance of ankle sprain.    tx 2: neuromuscular re-education as above , and also with visual feedback,  for L hip stability in sitting on the Nustep at level 3, bil LEs only x 5 min, and L hip stance stability and swing phase components during pre-gait and gait x 10'  in hallway using R railing.  Pt demonstrates decreased LLE awareness, decreased visual attention to L, impulsivity, and distractibility. Pt fatigued easily and required 3 seated rest breaks during standing activities.  Pt returned to room, transferred to recliner with min/mod assist and mod cues for safety, to L.  Dtr in room; PT advised pt and dtr again about the risks of falls, and that we would continually assess need for a quick release belt.   PT Evaluation Precautions/Restrictions- falls, impulsive   General   Vital SignsTherapy Vitals Temp: 98.5 F (36.9 C) Temp Source: Oral Pulse Rate: 70 Resp: 18 BP: 111/72 mmHg Patient Position (if appropriate): Sitting Oxygen Therapy SpO2: 98 % O2 Device: Not Delivered Pain Pain Assessment Pain Assessment: No/denies pain Home Living/Prior Functioning Home Living Type of Home: House Home Access: Stairs to enter Technical brewer of Steps: 3 Entrance Stairs-Rails: None (pt stated someone is going to build her a ramp with bil rails) Home Layout: One level  Lives With: Alone Prior Function Level of Independence: Independent with gait  Able to Take Stairs?: Yes Vocation: Full time employment Comments: works for [employer] Vision/Perception - L visual field deficit; to be further evaluated by OT    Cognition Overall Cognitive Status: Impaired/Different from baseline Arousal/Alertness: Awake/alert Orientation Level: Oriented X4 Sensation Sensation Light Touch: Appears Intact Proprioception: Impaired Detail Additional Comments: kinesthia impaired; position sense absent L knee and ankle Coordination Heel Shin Test: accuracy and speed impaired L Motor  Motor Motor: Hemiplegia;Abnormal tone Motor - Skilled Clinical Observations: L hamstrings  Mobility Bed  Mobility Bed Mobility: Not assessed Transfers Transfers: Yes Stand Pivot Transfers: 3: Mod assist Stand Pivot Transfer Details: Verbal cues for precautions/safety;Verbal cues for sequencing;Verbal cues for technique Locomotion  Ambulation Ambulation: Yes Ambulation/Gait Assistance: 1: +2 Total assist Ambulation Distance (Feet): 45 Feet Assistive device: None Ambulation/Gait Assistance Details:  Verbal cues for precautions/safety;Verbal cues for technique;Manual facilitation for weight shifting;Tactile cues for weight shifting Gait Gait: Yes Gait Pattern: Impaired Gait Pattern: Step-through pattern;Decreased hip/knee flexion - left;Decreased dorsiflexion - left;Left flexed knee in stance;Narrow base of support;Lateral hip instability;Poor foot clearance - left Gait velocity: impulsively quick Stairs / Additional Locomotion Stairs: Yes Stairs Assistance: 2: Max Industrial/product designer Assistance Details: Manual facilitation for placement;Manual facilitation for weight bearing;Manual facilitation for weight shifting;Verbal cues for precautions/safety;Verbal cues for technique;Verbal cues for sequencing Stair Management Technique: One rail Right;Step to pattern;Forwards Number of Stairs: 5 Height of Stairs: 7 Wheelchair Mobility Wheelchair Mobility: Yes Wheelchair Assistance: 3: Mod assist (poor reach with RLE to floor in regular ht w/c) Wheelchair Propulsion: Right lower extremity;Right upper extremity Wheelchair Parts Management: Needs assistance Distance: 50  Trunk/Postural Assessment  Cervical Assessment Cervical Assessment: Within Functional Limits Thoracic Assessment Thoracic Assessment: Within Functional Limits Lumbar Assessment Lumbar Assessment: Within Functional Limits Postural Control Postural Control: Deficits on evaluation Postural Limitations: leans L in standing  Balance Balance Balance Assessed: Yes Static Sitting Balance Static Sitting - Level of Assistance: 5: Stand by  assistance Dynamic Sitting Balance Dynamic Sitting - Level of Assistance: 5: Stand by assistance Static Standing Balance Static Standing - Level of Assistance: 4: Min assist Dynamic Standing Balance Dynamic Standing - Level of Assistance: 4: Min assist (reaching within BOS) Extremity Assessment        LLE Assessment LLE Assessment: Exceptions to [facility] LLE Strength LLE Overall Strength Comments: grossly in sitting: hip flex (with hip ER) 4-/5, hip abd/add 4/5, knee ext /flex  and ankle DF4+/5 LLE Tone LLE Tone Comments: hypertonus L hamstrings in sitting; pt stated she felt she had hurt her thigh during fall/crawling aftter CVA  FIM:  FIM - Bed/Chair Transfer Bed/Chair Transfer: 3: Chair or W/C > Bed: Mod A (lift or lower assist);3: Bed > Chair or W/C: Mod A (lift or lower assist) FIM - Locomotion: Wheelchair Distance: 50 Locomotion: Wheelchair: 1: Travels less than 50 ft with moderate assistance (Pt: 50 - 74%) FIM - Locomotion: Ambulation Locomotion: Ambulation Assistive Devices: Other (comment) (none) Ambulation/Gait Assistance: 1: +2 Total assist Locomotion: Ambulation: 1: Two helpers FIM - Locomotion: Stairs Locomotion: Scientist, physiological: Insurance account manager - 1 Locomotion: Stairs: 2: Up and Down 4 - 11 stairs with maximal assistance (Pt: 25 - 49%)   Refer to Care Plan for Long Term Goals  Recommendations for other services: None  Discharge Criteria: Patient will be discharged from PT if patient refuses treatment 3 consecutive times without medical reason, if treatment goals not met, if there is a change in medical status, if patient makes no progress towards goals or if patient is discharged from hospital.  The above assessment, treatment plan, treatment alternatives and goals were discussed and mutually agreed upon: by patient  Catori Panozzo,Jeniya 07/31/2014, 4:55 PM

## 2014-07-31 NOTE — Evaluation (Signed)
Occupational Therapy Assessment and Plan  Patient Details  Name: Carla Hopkins MRN: [id number] Date of Birth: 08-Oct-1950  OT Diagnosis: cognitive deficits, disturbance of vision and hemiplegia affecting non-dominant side Rehab Potential: Rehab Potential (ACUTE ONLY): Good ELOS: 3 weeks   Today's Date: 07/31/2014 OT Individual Time: 0800-0900 OT Individual Time Calculation (min): 60 min     Problem List:  Patient Active Problem List   Diagnosis Date Noted  . Acute right MCA stroke 07/30/2014  . Cerebral infarction due to thrombosis of right middle cerebral artery   . Thyroid activity decreased   . Left-sided weakness   . Stroke   . Essential hypertension   . Carotid artery stenosis   . CVA (cerebral infarction) 07/24/2014  . Hyperlipidemia 07/24/2014  . Diabetes 07/24/2014  . Elevated troponin 07/24/2014  . Tobacco abuse 07/24/2014  . Benign essential HTN 07/23/2014  . Obstructive sleep apnea 07/23/2014  . Hypothyroidism 07/03/2014  . Type II or unspecified type diabetes mellitus without mention of complication, not stated as uncontrolled 09/25/2013  . BMI 38.0-38.9,adult 05/19/2013  . Nicotine addiction 05/19/2013  . Right atrial enlargement 05/19/2013    Past Medical History:  Past Medical History  Diagnosis Date  . Hypothyroidism   . Hypertension    Past Surgical History:  Past Surgical History  Procedure Laterality Date  . Tubal ligation      Assessment & Plan Clinical Impression:Carla Hopkins is a 64 y.o. right handed female with history of tobacco abuse, hypertension. Patient lives alone independently prior to admission and working full time. Admitted 07/24/2014 after a fall noting left-sided weakness. No head trauma no loss of consciousness. MRI of the brain shows multifocal patchy acute right MCA territory infarcts. Additional 8 mm cortical infarct within the parasagittal cortical gray matter of the anterior right frontal lobe, likely right ACA distribution.  MRA of the head with right MCA M2 inferior division occlusion. CT angiogram chest negative except for a 6 mm pulmonary nodule on the right with recommendation follow-up CT the chest in 6-12 months. Elevated troponin 4.17. Echocardiogram with ejection fraction of 70% and grade 1 diastolic dysfunction. Carotid Doppler with left 50-60% and right 80% ICA stenosis. Patient did not receive TPA. Vascular surgery Dr. Oneida Alar consulted in relation to ICA stenosis plan follow-up as an outpatient to proceed with possible CEA. Follow-up cardiology for elevated troponin not felt to be due to ACS per Dr. Burt Knack and no further workup was indicated. Neurology consulted presently on aspirin and Plavix for CVA prophylaxis for 3 months then Plavix alone. Subcutaneous heparin for DVT prophylaxis. Findings of elevated hemoglobin A1c of 8.5 and placed on Glucophage with sliding scale insulin. Dysphagia 2 nectar thick liquid diet with follow per speech therapy. Physica and occupational l therapy evaluations completed with recommendations of physical medicine rehabilitation consult. Patient was admitted for a comprehensive rehabilitation program Patient transferred to CIR on 07/30/2014 .    Patient currently requires max with basic self-care skills secondary to muscle weakness, decreased cardiorespiratoy endurance, abnormal tone, decreased visual perceptual skills and field cut, left side neglect and decreased problem solving.  Prior to hospitalization, patient was fully independent and working full time.  Patient will benefit from skilled intervention to increase independence with basic self-care skills and increase level of independence with iADL prior to discharge home with care partner.  Anticipate patient will require 24 hour supervision and follow up home health.  OT - End of Session Activity Tolerance: Tolerates 10 - 20 min activity with multiple rests OT  Assessment Rehab Potential (ACUTE ONLY): Good Barriers to Discharge:  Decreased caregiver support Barriers to Discharge Comments: daughter lives in [place] OT Patient demonstrates impairments in the following area(s): Balance;Cognition;Endurance;Motor;Perception;Sensory;Vision OT Basic ADL's Functional Problem(s): Eating;Grooming;Dressing;Bathing;Toileting OT Advanced ADL's Functional Problem(s): Simple Meal Preparation;Light Housekeeping OT Transfers Functional Problem(s): Toilet;Tub/Shower OT Additional Impairment(s): Fuctional Use of Upper Extremity OT Plan OT Intensity: Minimum of 1-2 x/day, 45 to 90 minutes OT Frequency: 5 out of 7 days OT Duration/Estimated Length of Stay: 3 weeks OT Treatment/Interventions: Balance/vestibular training;Discharge planning;DME/adaptive equipment instruction;Cognitive remediation/compensation;Community reintegration;Functional mobility training;Functional electrical stimulation;Neuromuscular re-education;Self Care/advanced ADL retraining;Psychosocial support;Patient/family education;Therapeutic Activities;Therapeutic Exercise;UE/LE Strength taining/ROM;UE/LE Coordination activities;Visual/perceptual remediation/compensation OT Self Feeding Anticipated Outcome(s): mod I OT Basic Self-Care Anticipated Outcome(s): mod I OT Toileting Anticipated Outcome(s): mod I OT Bathroom Transfers Anticipated Outcome(s): mod I OT Recommendation Patient destination: Home Follow Up Recommendations: Home health OT Equipment Recommended: Tub/shower bench   Skilled Therapeutic Intervention Pt seen for initial evaluation and ADL retraining with a focus on safe functional mobility and L side awareness. Pt completed toileting, shower, dressing with mod-max assist and mod cues.  Pt very motivated and followed directions well, but did need cues due to decreased complex problem solving.  Pt's daughter present at start and end of session and OT POC, goals and D/C recommendations reviewed with pt and her family.  OT  Evaluation Precautions/Restrictions  Precautions Precautions: Fall Precaution Comments: severe L sided neglect Restrictions Weight Bearing Restrictions: No   Pain Pain Assessment Pain Assessment: No/denies pain Home Living/Prior Functioning Home Living Type of Home: House Home Access: Stairs to enter Entrance Stairs-Number of Steps: 3 Entrance Stairs-Rails: None (pt stated someone is going to build her a ramp with bil rails) Home Layout: One level  Lives With: Alone Prior Function Level of Independence: Independent with basic ADLs, Independent with homemaking with ambulation, Independent with gait, Independent with transfers  Able to Take Stairs?: Yes Driving: Yes Vocation: Full time employment Comments: works for [employer] ADL ADL ADL Comments: Refer to FIM Vision/Perception  Vision- History Baseline Vision/History: Wears glasses Wears Glasses: At all times Patient Visual Report: No change from baseline Vision- Assessment Eye Alignment: Within Functional Limits Ocular Range of Motion: Within Functional Limits Alignment/Gaze Preference: Gaze right Tracking/Visual Pursuits: Able to track stimulus in all quads without difficulty Saccades: Additional eye shifts occurred during testing;Undershoots;Decreased speed of saccadic movement Convergence: Within functional limits Visual Fields: Left visual field deficit;Impaired-to be further tested in functional context  Cognition Overall Cognitive Status: Impaired/Different from baseline Arousal/Alertness: Awake/alert Orientation Level: Oriented X4 Problem Solving: Impaired Problem Solving Impairment: Functional complex Behaviors: Impulsive (slightly impulsive with mobility) Safety/Judgment: Appears intact Sensation Sensation Light Touch: Impaired Detail Light Touch Impaired Details: Impaired LUE Stereognosis: Impaired by gross assessment Hot/Cold: Not tested Proprioception: Impaired Detail Proprioception  Impaired Details: Impaired LUE;Impaired RLE Additional Comments: kinesthia impaired; position sense absent L knee and ankle Coordination Gross Motor Movements are Fluid and Coordinated: No Fine Motor Movements are Fluid and Coordinated: No Finger Nose Finger Test: unable to raise hand to mouth Heel Shin Test: accuracy and speed impaired L Motor  Motor Motor: Hemiplegia;Abnormal tone Motor - Skilled Clinical Observations: L hamstrings Mobility  Bed Mobility Bed Mobility: Not assessed  Trunk/Postural Assessment  Cervical Assessment Cervical Assessment: Within Functional Limits Thoracic Assessment Thoracic Assessment: Within Functional Limits Lumbar Assessment Lumbar Assessment: Within Functional Limits Postural Control Postural Control: Deficits on evaluation Postural Limitations: leans L in standing  Balance Balance Balance Assessed: Yes Static Sitting Balance Static Sitting - Level  of Assistance: 5: Stand by assistance Dynamic Sitting Balance Dynamic Sitting - Level of Assistance: 5: Stand by assistance Static Standing Balance Static Standing - Level of Assistance: 4: Min assist Dynamic Standing Balance Dynamic Standing - Level of Assistance: 4: Min assist (reaching within BOS) Extremity/Trunk Assessment RUE Assessment RUE Assessment: Within Functional Limits LUE Assessment LUE Assessment: Exceptions to [facility] (no active distal movement) LUE AROM (degrees) Left Shoulder Flexion: 30 Degrees Left Wrist Flexion: 30 Degrees  FIM:  FIM - Grooming Grooming Steps: Wash, rinse, dry face;Wash, rinse, dry hands;Brush, comb hair Grooming: 4: Patient completes 3 of 4 or 4 of 5 steps FIM - Bathing Bathing Steps Patient Completed: Chest;Left Arm;Abdomen;Front perineal area;Buttocks;Right upper leg Bathing: 3: Mod-Patient completes 5-7 [id number] parts or 50-74% FIM - Upper Body Dressing/Undressing Upper body dressing/undressing steps patient completed: Thread/unthread right sleeve of  pullover shirt/dresss;Put head through opening of pull over shirt/dress Upper body dressing/undressing: 2: Max-Patient completed 25-49% of tasks FIM - Lower Body Dressing/Undressing Lower body dressing/undressing: 1: Total-Patient completed less than 25% of tasks FIM - Toileting Toileting steps completed by patient: Performs perineal hygiene Toileting: 2: Max-Patient completed 1 of 3 steps FIM - Bed/Chair Transfer Bed/Chair Transfer: 3: Chair or W/C > Bed: Mod A (lift or lower assist);3: Bed > Chair or W/C: Mod A (lift or lower assist) FIM - TRadio producerDevices: Grab bars Toilet Transfers: 3-To toilet/BSC: Mod A (lift or lower assist);3-From toilet/BSC: Mod A (lift or lower assist) FIM - Tub/Shower Transfers Tub/Shower Assistive Devices: Tub transfer bench;Grab bars;Walk in shower Tub/shower Transfers: 3-Into Tub/Shower: Mod A (lift or lower/lift 2 legs);3-Out of Tub/Shower: Mod A (lift or lower/lift 2 legs)   Refer to Care Plan for Long Term Goals  Recommendations for other services: None  Discharge Criteria: Patient will be discharged from OT if patient refuses treatment 3 consecutive times without medical reason, if treatment goals not met, if there is a change in medical status, if patient makes no progress towards goals or if patient is discharged from hospital.  The above assessment, treatment plan, treatment alternatives and goals were discussed and mutually agreed upon: by patient and by family  Tyce Delcid 07/31/2014, 12:57 PM

## 2014-07-31 NOTE — Progress Notes (Signed)
Patient information reviewed and entered into e[facility] system by Ceana Fiala, RN, CRRN, PPS Coordinator.  Information including medical coding and functional independence measure will be reviewed and updated through discharge.    

## 2014-07-31 NOTE — Care Management Note (Signed)
[facility] Individual Statement of Services  Patient Name:  Carla Hopkins  Date:  07/31/2014  Welcome to the [facility].  Our goal is to provide you with an individualized program based on your diagnosis and situation, designed to meet your specific needs.  With this comprehensive rehabilitation program, you will be expected to participate in at least 3 hours of rehabilitation therapies Monday-Friday, with modified therapy programming on the weekends.  Your rehabilitation program will include the following services:  Physical Therapy (PT), Occupational Therapy (OT), Speech Therapy (ST), 24 hour per day rehabilitation nursing, Therapeutic Recreaction (TR), Case Management (Social Worker), Rehabilitation Medicine, Nutrition Services and Pharmacy Services  Weekly team conferences will be held on Wednesday to discuss your progress.  Your Social Worker will talk with you frequently to get your input and to update you on team discussions.  Team conferences with you and your family in attendance may also be held.  Expected length of stay: 14-21 days Overall anticipated outcome: mod/i level-supervision in community and with stairs  Depending on your progress and recovery, your program may change. Your Social Worker will coordinate services and will keep you informed of any changes. Your Social Worker's name and contact numbers are listed  below.  The following services may also be recommended but are not provided by the [facility] will be made to provide these services after discharge if needed.  Arrangements include referral to agencies that provide these services.  Your insurance has been verified to be:  [employer] Your primary doctor is:  Ellsworth Lennox  Pertinent information will be shared with  your doctor and your insurance company.  Social Worker:  Ovidio Kin, [place] or [phone]  Information discussed with and copy given to patient by: Elease Hashimoto, 07/31/2014, 11:55 AM

## 2014-08-01 ENCOUNTER — Inpatient Hospital Stay (HOSPITAL_COMMUNITY): Payer: BLUE CROSS/BLUE SHIELD | Admitting: Occupational Therapy

## 2014-08-01 ENCOUNTER — Inpatient Hospital Stay (HOSPITAL_COMMUNITY): Payer: BLUE CROSS/BLUE SHIELD | Admitting: Speech Pathology

## 2014-08-01 ENCOUNTER — Inpatient Hospital Stay (HOSPITAL_COMMUNITY): Payer: BLUE CROSS/BLUE SHIELD

## 2014-08-01 LAB — GLUCOSE, CAPILLARY
GLUCOSE-CAPILLARY: 163 mg/dL — AB (ref 70–99)
GLUCOSE-CAPILLARY: 134 mg/dL — AB (ref 70–99)
Glucose-Capillary: 155 mg/dL — ABNORMAL HIGH (ref 70–99)
Glucose-Capillary: 142 mg/dL — ABNORMAL HIGH (ref 70–99)

## 2014-08-01 MED ORDER — OXYCODONE-ACETAMINOPHEN 5-325 MG PO TABS: 1.0000 | ORAL_TABLET | Freq: Four times a day (QID) | ORAL | Status: DC | PRN

## 2014-08-01 NOTE — Progress Notes (Addendum)
Occupational Therapy Session Note  Patient Details  Name: Carla Hopkins MRN: [id number] Date of Birth: Jul 22, 1950  Today's Date: 08/01/2014 OT Individual Time: 0800-0900 and 7014-1030 OT Individual Time Calculation (min): 60 min and 33 min   Short Term Goals: Week 1:  OT Short Term Goal 1 (Week 1): Pt will bathe with min A. OT Short Term Goal 2 (Week 1): Pt will toilet with min A. OT Short Term Goal 3 (Week 1): Pt will don shirt min A. OT Short Term Goal 4 (Week 1): Pt will don pants with mod A. OT Short Term Goal 5 (Week 1): Pt will be independent with self ROM of LUE.  Skilled Therapeutic Interventions/Progress Updates:    Visit 1: Pain: no c/o pain Tx:   Pt seen for BADL retraining of toileting, bathing, and dressing with a focus on left side awareness, use of LUE, dynamic balance and problem solving.  Pt ambulated from bed to toilet to shower to chair in bathroom to recliner in room will [place] with mod A and facilitation at L hip. Improved dressing and bathing skills today as pt had more trunk control in sitting to be able to lean forward further. Improved R shoulder AROM to lift arm for dressing. Continues to need mod Cues with attention to L as she has a strong R gaze preference. Focused on dynamic stand balance with donning clothing over hips with min A to complete over L hip. Pt is also limited by body habitus with dressing and does have premorbid knee pain.  Pt became very emotional at the end of the session, stating she felt hopeless about being able to recover enough to go home. Spent time counseling and encouraging pt. Later notified SW for a neuropsych referral.  Pt left sitting in recliner with L arm positioned on tray table to encourage self ROM.  Call light in reach.  Visit 2:  Pain: no c/o pain Tx:  Pt seen this session for LUE neuro re-ed with estim to finger and wrist extensors.  Intensity 25 at 35 pps, 10 sec on 5 off for 15 min.  No active movement afterwards even with  tapping, but pt did like the estim and worthwhile to keep trying this modality.   LUE wt bearing on mat in sitting with side and forward leaning. Pt is developing mod hypertone in elbow flexors.  Sit to stand initiated but her R knee was hurting.  Educated pt on self ROM for wrist and hand. Provided her a lap tray. Pt returned to room with all needs met.  Therapy Documentation Precautions:  Precautions Precautions: Fall Precaution Comments: severe L sided neglect Restrictions Weight Bearing Restrictions: No       ADL: ADL ADL Comments: Refer to FIM  See FIM for current functional status  Therapy/Group: Individual Therapy  [place] 08/01/2014, 11:23 AM

## 2014-08-01 NOTE — Progress Notes (Signed)
64 y.o. right handed female with history of tobacco abuse, hypertension. Patient lives alone independently prior to admission and working full time. Admitted 07/24/2014 after a fall noting left-sided weakness. No head trauma no loss of consciousness. MRI of the brain shows multifocal patchy acute right MCA territory infarcts. Additional 8 mm cortical infarct within the parasagittal cortical gray matter of the anterior right frontal lobe, likely right ACA distribution. MRA of the head with right MCA M2 inferior division occlusion. CT angiogram chest negative except for a 6 mm pulmonary nodule on the right with recommendation follow-up CT the chest in 6-12 months. Elevated troponin 4.17. Echocardiogram with ejection fraction of 70% and grade 1 diastolic dysfunction. Carotid Doppler with left 50-60% and right 80% ICA stenosis. Patient did not receive TPA. Vascular surgery Dr. Fields consulted in relation to ICA stenosis plan follow-up as an outpatient to proceed with possible CEA. Follow-up cardiology for elevated troponin not felt to be due to ACS per Dr. Cooper and no further workup was indicated. Neurology consulted presently on aspirin and Plavix for CVA prophylaxis for 3 months then Plavix alone   Subjective/Complaints:   Review of systems positive for left upper extremity weakness, no bowel or bladder difficulties, no breathing problems, otherwise negative  Objective: Vital Signs: Blood pressure 137/70, pulse 70, temperature 97.7 F (36.5 C), temperature source Oral, resp. rate 18, height 5' 2.5" (1.588 m), weight 94 kg (207 lb 3.7 oz), SpO2 95 %. No results found. Results for orders placed or performed during the hospital encounter of 07/30/14 (from the past 72 hour(s))  CBC     Status: Abnormal   Collection Time: 07/30/14  4:05 PM  Result Value Ref Range   WBC 13.1 (H) 4.0 - 10.5 K/uL   RBC 5.32 (H) 3.87 - 5.11 MIL/uL   Hemoglobin 15.4 (H) 12.0 - 15.0 g/dL   HCT 45.9 36.0 - 46.0 %   MCV  86.3 78.0 - 100.0 fL   MCH 28.9 26.0 - 34.0 pg   MCHC 33.6 30.0 - 36.0 g/dL   RDW 15.5 11.5 - 15.5 %   Platelets 291 150 - 400 K/uL  Creatinine, serum     Status: None   Collection Time: 07/30/14  4:05 PM  Result Value Ref Range   Creatinine, Ser 0.92 0.44 - 1.00 mg/dL   GFR calc non Af Amer >60 >60 mL/min   GFR calc Af Amer >60 >60 mL/min    Comment: (NOTE) The eGFR has been calculated using the CKD EPI equation. This calculation has not been validated in all clinical situations. eGFR's persistently <90 mL/min signify possible Chronic Kidney Disease.   Glucose, capillary     Status: Abnormal   Collection Time: 07/30/14  5:15 PM  Result Value Ref Range   Glucose-Capillary 149 (H) 70 - 99 mg/dL  Glucose, capillary     Status: Abnormal   Collection Time: 07/30/14  9:03 PM  Result Value Ref Range   Glucose-Capillary 134 (H) 70 - 99 mg/dL  Glucose, capillary     Status: Abnormal   Collection Time: 07/31/14  6:57 AM  Result Value Ref Range   Glucose-Capillary 158 (H) 70 - 99 mg/dL  CBC WITH DIFFERENTIAL     Status: Abnormal   Collection Time: 07/31/14  7:16 AM  Result Value Ref Range   WBC 12.1 (H) 4.0 - 10.5 K/uL   RBC 5.25 (H) 3.87 - 5.11 MIL/uL   Hemoglobin 15.2 (H) 12.0 - 15.0 g/dL   HCT 45.5 36.0 -   46.0 %   MCV 86.7 78.0 - 100.0 fL   MCH 29.0 26.0 - 34.0 pg   MCHC 33.4 30.0 - 36.0 g/dL   RDW 15.5 11.5 - 15.5 %   Platelets 257 150 - 400 K/uL   Neutrophils Relative % 65 43 - 77 %   Neutro Abs 8.0 (H) 1.7 - 7.7 K/uL   Lymphocytes Relative 21 12 - 46 %   Lymphs Abs 2.6 0.7 - 4.0 K/uL   Monocytes Relative 8 3 - 12 %   Monocytes Absolute 1.0 0.1 - 1.0 K/uL   Eosinophils Relative 5 0 - 5 %   Eosinophils Absolute 0.6 0.0 - 0.7 K/uL   Basophils Relative 1 0 - 1 %   Basophils Absolute 0.1 0.0 - 0.1 K/uL  Comprehensive metabolic panel     Status: Abnormal   Collection Time: 07/31/14  7:16 AM  Result Value Ref Range   Sodium 138 135 - 145 mmol/L   Potassium 4.0 3.5 - 5.1  mmol/L   Chloride 103 101 - 111 mmol/L   CO2 23 22 - 32 mmol/L   Glucose, Bld 168 (H) 70 - 99 mg/dL   BUN 17 6 - 20 mg/dL   Creatinine, Ser 0.90 0.44 - 1.00 mg/dL   Calcium 9.1 8.9 - 10.3 mg/dL   Total Protein 6.2 (L) 6.5 - 8.1 g/dL   Albumin 3.2 (L) 3.5 - 5.0 g/dL   AST 25 15 - 41 U/L   ALT 24 14 - 54 U/L   Alkaline Phosphatase 77 38 - 126 U/L   Total Bilirubin 0.8 0.3 - 1.2 mg/dL   GFR calc non Af Amer >60 >60 mL/min   GFR calc Af Amer >60 >60 mL/min    Comment: (NOTE) The eGFR has been calculated using the CKD EPI equation. This calculation has not been validated in all clinical situations. eGFR's persistently <90 mL/min signify possible Chronic Kidney Disease.    Anion gap 12 5 - 15  Glucose, capillary     Status: Abnormal   Collection Time: 07/31/14 11:20 AM  Result Value Ref Range   Glucose-Capillary 147 (H) 70 - 99 mg/dL   Comment 1 Notify RN   Glucose, capillary     Status: Abnormal   Collection Time: 07/31/14  4:35 PM  Result Value Ref Range   Glucose-Capillary 136 (H) 70 - 99 mg/dL   Comment 1 Notify RN   Glucose, capillary     Status: Abnormal   Collection Time: 07/31/14  9:27 PM  Result Value Ref Range   Glucose-Capillary 155 (H) 70 - 99 mg/dL  Glucose, capillary     Status: Abnormal   Collection Time: 08/01/14  6:55 AM  Result Value Ref Range   Glucose-Capillary 163 (H) 70 - 99 mg/dL     HEENT: normal Cardio: RRR and no murmurs Resp: CTA B/L and unlabored GI: BS positive and nontender nondistended Extremity:  Pulses positive and No Edema Skin:   Intact Neuro: Alert/Oriented, Cranial Nerve II-XII normal, Abnormal Sensory reduced left upper extremity,  does withdraw weakly to pinch, Abnormal Motor 2 minus biceps as well as shoulder retraction, 3- abduction, 0 at the wrist and trace fingers,  left lower extremity 4/5 and hip flexor and extensor ankle dorsi flexor and Other , there is evidence of neglect Musc/Skel:  Other no pain with left shoulder range of  motion Gen. no acute distress   Assessment/Plan: 1. Functional deficits secondary to right MCA infarct causing left hemiparesis upper greater   than lower which require 3+ hours per day of interdisciplinary therapy in a comprehensive inpatient rehab setting. Physiatrist is providing close team supervision and 24 hour management of active medical problems listed below. Physiatrist and rehab team continue to assess barriers to discharge/monitor patient progress toward functional and medical goals. FIM: FIM - Bathing Bathing Steps Patient Completed: Chest, Left Arm, Abdomen, Front perineal area, Buttocks, Right upper leg Bathing: 3: Mod-Patient completes 5-7 [id number] parts or 50-74%  FIM - Upper Body Dressing/Undressing Upper body dressing/undressing steps patient completed: Thread/unthread right sleeve of pullover shirt/dresss, Put head through opening of pull over shirt/dress Upper body dressing/undressing: 2: Max-Patient completed 25-49% of tasks FIM - Lower Body Dressing/Undressing Lower body dressing/undressing: 1: Total-Patient completed less than 25% of tasks  FIM - Toileting Toileting steps completed by patient: Performs perineal hygiene Toileting: 2: Max-Patient completed 1 of 3 steps  FIM - TRadio producerDevices: BRecruitment consultantTransfers: 4-To toilet/BSC: Min A (steadying Pt. > 75%), 4-From toilet/BSC: Min A (steadying Pt. > 75%)  FIM - Bed/Chair Transfer Bed/Chair Transfer: 3: Chair or W/C > Bed: Mod A (lift or lower assist), 3: Bed > Chair or W/C: Mod A (lift or lower assist)  FIM - Locomotion: Wheelchair Distance: 50 Locomotion: Wheelchair: 1: Travels less than 50 ft with moderate assistance (Pt: 50 - 74%) FIM - Locomotion: Ambulation Locomotion: Ambulation Assistive Devices: Other (comment) (none) Ambulation/Gait Assistance: 1: +2 Total assist Locomotion: Ambulation: 1: Two helpers  Comprehension Comprehension Mode:  Auditory Comprehension: 6-Follows complex conversation/direction: With extra time/assistive device  Expression Expression Mode: Verbal Expression: 6-Expresses complex ideas: With extra time/assistive device  Social Interaction Social Interaction: 6-Interacts appropriately with others with medication or extra time (anti-anxiety, antidepressant).  Problem Solving Problem Solving: 4-Solves basic 75 - 89% of the time/requires cueing 10 - 24% of the time  Memory Memory: 4-Recognizes or recalls 75 - 89% of the time/requires cueing 10 - 24% of the time  Medical Problem List and Plan: 1. Functional deficits secondary to right MCA infarct causing left hemiparesis and Left hemisensory deficits 2.  DVT Prophylaxis/Anticoagulation: Subcutaneous heparin. Monitor platelet counts and any signs of bleeding 3. Pain Management: Tylenol as needed 4. Dysphagia. Dysphagia #2 nectar liquids. Monitor for any aspiration. Follow-up speech therapy 5. Neuropsych: This patient is capable of making decisions on her own behalf. 6. Skin/Wound Care: Routine skin checks 7. Fluids/Electrolytes/Nutrition: Strict I and O's with follow-up chemistries 8. Hypertension. Hydrochlorothiazide 12.5 mg daily, lisinopril 5 mg daily. Monitor with increased mobility 9. Right ICA stenosis. Follow-up vascular surgery Dr. FOneida Alarto address CEA in 2-3 weeks 10. 6 mm pulmonary nodule identified on CT angiogram of chest. Recommendations follow-up CT of the chest 6-12 months 11. New findings diabetes mellitus. Hemoglobin A1c 8.5. Glucophage 500 mg twice a day. Check blood sugars before meals and at bedtime. Provide diabetic teaching 12. Hypothyroidism. Synthroid. TSH 2.151 13. Hyperlipidemia. Lipitor   LOS (Days) 2 A FACE TO FACE EVALUATION WAS PERFORMED  Berley Gambrell E 08/01/2014, 8:08 AM

## 2014-08-01 NOTE — IPOC Note (Signed)
Overall Plan of Care [facility]) Patient Details Name: Carla Hopkins MRN: [id number] DOB: 03-13-1951  Admitting Diagnosis: CVA  Hospital Problems: Principal Problem:   Cerebral infarction due to thrombosis of right middle cerebral artery Active Problems:   Benign essential HTN   Left-sided weakness     Functional Problem List: Nursing Endurance, Medication Management, Nutrition, Pain, Perception, Safety, Sensory, Skin Integrity  PT Balance, Behavior, Endurance, Motor, Sensory  OT Balance, Cognition, Endurance, Motor, Perception, Sensory, Vision  SLP Cognition, Linguistic  TR         Basic ADL's: OT Eating, Grooming, Dressing, Bathing, Toileting     Advanced  ADL's: OT Simple Meal Preparation, Light Housekeeping     Transfers: PT Bed Mobility, Bed to Chair, Musician, Manufacturing systems engineer, Metallurgist: PT Stairs, Emergency planning/management officer, Ambulation     Additional Impairments: OT Fuctional Use of Upper Extremity  SLP Swallowing, Communication, Social Cognition expression Problem Solving, Memory, Attention, Awareness  TR      Anticipated Outcomes Item Anticipated Outcome  Self Feeding mod I  Swallowing  Mod I   Basic self-care  mod I  Toileting  mod I   Bathroom Transfers mod I  Bowel/Bladder  Supervision  Transfers  modified independent basic and furniture; superviison car  Locomotion  modified independent gait 50' home setting, superviison 150' controlled and community; supervision w/c 150' controlled  Communication  mod I   Cognition  Mod I  Pain  <2 on a 0-10 scale  Safety/Judgment  min assist   Therapy Plan: PT Intensity: Minimum of 1-2 x/day ,45 to 90 minutes PT Frequency: 5 out of 7 days PT Duration Estimated Length of Stay: 18-21 days OT Intensity: Minimum of 1-2 x/day, 45 to 90 minutes OT Frequency: 5 out of 7 days OT Duration/Estimated Length of Stay: 3 weeks SLP Intensity: Minumum of 1-2 x/day, 30 to 90 minutes SLP Frequency: 3 to 5  out of 7 days SLP Duration/Estimated Length of Stay: 14-21 days        Team Interventions: Nursing Interventions Patient/Family Education, Disease Management/Prevention, Pain Management, Medication Management, Skin Care/Wound Management, Discharge Planning, Psychosocial Support  PT interventions Ambulation/gait training, Training and development officer, Cognitive remediation/compensation, Discharge planning, Community reintegration, DME/adaptive equipment instruction, Functional mobility training, Patient/family education, Neuromuscular re-education, Psychosocial support, Splinting/orthotics, Therapeutic Exercise, Therapeutic Activities, Stair training, UE/LE Strength taining/ROM, UE/LE Coordination activities, Wheelchair propulsion/positioning, Visual/perceptual remediation/compensation  OT Interventions Training and development officer, Discharge planning, DME/adaptive equipment instruction, Cognitive remediation/compensation, Community reintegration, Functional mobility training, Functional electrical stimulation, Neuromuscular re-education, Self Care/advanced ADL retraining, Psychosocial support, Patient/family education, Therapeutic Activities, Therapeutic Exercise, UE/LE Strength taining/ROM, UE/LE Coordination activities, Visual/perceptual remediation/compensation  SLP Interventions Cognitive remediation/compensation, Cueing hierarchy, Dysphagia/aspiration precaution training, Patient/family education, Speech/Language facilitation, Oral motor exercises, Internal/external aids, Environmental controls  TR Interventions    SW/CM Interventions Discharge Planning, Psychosocial Support, Patient/Family Education    Team Discharge Planning: Destination: PT-Home ,OT- Home , SLP-Home Projected Follow-up: PT-Home health PT, Outpatient PT, OT-  Home health OT, SLP-Home Health SLP, 24 hour supervision/assistance Projected Equipment Needs: PT-To be determined, OT- Tub/shower bench, SLP-None recommended by  SLP Equipment Details: PT- , OT-  Patient/family involved in discharge planning: PT- Patient, Family member/caregiver,  OT-Patient, Family member/caregiver, SLP-Patient, Family member/caregiver  MD ELOS: 18-21 days Medical Rehab Prognosis:  Excellent Assessment: The patient has been admitted for CIR therapies with the diagnosis of right MCA infarct. The team will be addressing functional mobility, strength, stamina, balance, safety, adaptive techniques and equipment, self-care, bowel and bladder mgt, patient  and caregiver education, NMR, cognitive perceptual awareness, visual-perceptual awarenss, coping skills, community reintegration, speech, swallowing. Goals have been set at mod I for mobility, self-care, cognition, communication, swallowing. Meredith Staggers, MD, FAAPMR      See Team Conference Notes for weekly updates to the plan of care

## 2014-08-01 NOTE — Progress Notes (Signed)
Speech Language Pathology Daily Session Note  Patient Details  Name: Carla Hopkins MRN: [id number] Date of Birth: Jun 01, 1950  Today's Date: 08/01/2014 SLP Individual Time: 1500-1530 SLP Individual Time Calculation (min): 30 min  Short Term Goals: Week 1: SLP Short Term Goal 1 (Week 1): Pt will attend to left body/environment during semi-complex functional tasks with min assist verbal cues SLP Short Term Goal 2 (Week 1): Pt will complete semi-complex tasks with min assist verbal cues for functional problem solving.   SLP Short Term Goal 3 (Week 1): Pt will return demonstration of oral motor strengthening exercises targeting left sided weakness with supervision.  SLP Short Term Goal 4 (Week 1): Pt will utilize slow rate and overarticulation to achieve intelligibility in conversations with supervision.  SLP Short Term Goal 5 (Week 1): Pt will consume regular textures and thin liquids with mod I self monitoring and correction of left sided buccal residue.    Skilled Therapeutic Interventions:  Pt was seen for skilled ST targeting cognitive goals.  Upon arrival, pt was seated upright in recliner, awake, alert, and agreeable to participate in ST.  SLP facilitated the session with various portions of the ALFA standardized cognitive assessment for ongoing diagnostic treatment of cognition.  Pt required min assist verbal and visual cues to completely scan to the left when reading and answering word problems for functional math calculations.  Pt also required min assist verbal cues for organization and error awareness when utilizing a calculator to balance a checkbook.  Pt endorses intellectual awareness into task difficulty but required overall min assist to recognize and correct errors in the moment.  Continue per current plan of care.    FIM:  Comprehension Comprehension Mode: Auditory Comprehension: 6-Follows complex conversation/direction: With extra time/assistive device Expression Expression  Mode: Verbal Expression: 5-Expresses basic 90% of the time/requires cueing < 10% of the time. Social Interaction Social Interaction: 6-Interacts appropriately with others with medication or extra time (anti-anxiety, antidepressant). Problem Solving Problem Solving: 4-Solves basic 75 - 89% of the time/requires cueing 10 - 24% of the time Memory Memory: 4-Recognizes or recalls 75 - 89% of the time/requires cueing 10 - 24% of the time  Pain Pain Assessment Pain Assessment: No/denies pain  Therapy/Group: Individual Therapy  Keifer Habib, Selinda Orion 08/01/2014, 4:33 PM

## 2014-08-01 NOTE — Progress Notes (Signed)
Social Work Patient ID: Bevan Vu, female   DOB: 09/13/1950, 64 y.o.   MRN: [id number] Have informed [place] and Dan-PA and MD to contact [facility] and discuss if procedure will be Tuesday or put off to a later time.

## 2014-08-01 NOTE — Progress Notes (Signed)
Occupational Therapy Session Note  Patient Details  Name: Carla Hopkins MRN: [id number] Date of Birth: 1950/12/06  Today's Date: 08/01/2014 OT Individual Time: 1400-1430 OT Individual Time Calculation (min): 30 min    Short Term Goals: Week 1:  OT Short Term Goal 1 (Week 1): Pt will bathe with min A. OT Short Term Goal 2 (Week 1): Pt will toilet with min A. OT Short Term Goal 3 (Week 1): Pt will don shirt min A. OT Short Term Goal 4 (Week 1): Pt will don pants with mod A. OT Short Term Goal 5 (Week 1): Pt will be independent with self ROM of LUE.  Skilled Therapeutic Interventions/Progress Updates:    Addressed UE therapeutic activities, NMRE and education on skin integrity.  She engaged in UE activities using with emphasis on left elbow extension with increased weight bearing.  Educated pt on looking at arm to facilitate movement strategies.  Instructed pt on pressure relief for skin integrity.Practiced standing balance for 1- 2 minutes.   Left pt in wc with  call bell,phone within reach.    Therapy Documentation Precautions:  Precautions Precautions: Fall Precaution Comments: severe L sided neglect Restrictions Weight Bearing Restrictions: No      Pain: Pain Assessment Pain Assessment: No/denies pain ADL: ADL ADL Comments: Refer to FIM      See FIM for current functional status  Therapy/Group: Individual Therapy  Lisa Roca 08/01/2014, 6:20 PM

## 2014-08-01 NOTE — Progress Notes (Signed)
Social Work Patient ID: Carla Hopkins, female   DOB: Oct 02, 1950, 64 y.o.   MRN: [id number] Was informed by Stacy-RN who reports [facility] came in and scheduled a CEA for Tuesday am.  Have text MD and spoke with Lynn-[employer] Case Manager to inform She reports if pt stays on acute overnight the case needs to be closed and re-opened, otherwise if she does not stay on acute for 24 hr then will not have to re-open the case. Will find out how long pt would need to stay on acute to be monitored after procedure. Will work on this plan, pt would like to have done due to she is worried she will have another stroke.

## 2014-08-01 NOTE — Progress Notes (Signed)
Physical Therapy Session Note  Patient Details  Name: Carla Hopkins MRN: [id number] Date of Birth: [birth year]  Today's Date: 08/01/2014 PT Individual Time: 1000-1100 PT Individual Time Calculation (min): 60 min   Short Term Goals: Week 1:     Skilled Therapeutic Interventions/Progress Updates:  Patient sitting in recliner upon entering room. Patient transferred by stand pivot from recliner to wheelchair with min steady assist. Patient jumped up impulsively and was reminded to make sure helper is ready before moving. Patient reported needing to use restroom. Patient performed toilet transfer with min steady assist using grab bar. Patient again needed cueing to make sure helper is ready before standing. Patient managed clothes prior and performed hygiene but required some assist with pant on left following toileting. Patient ambulated with small base quad cane 100 feet x 2 with mod assist/cueing for cane placement, increase stride with right and increase hip and knee flexion to clear left foot during swing. Patient ambulated up and down 5 steps with 1 railing and mod assist/cueing for sequencing. Patient worked on increasing left hip and knee flexion and single leg stance by alternating stepping up and down 4 inch step, 6 inch step, and kicking ball with min assist for dynamic balance. Patient ambulated with quad cane and kicking yoga block with mod assist as this challenged balance. Patient left in gym with OT.  Therapy Documentation Precautions:  Precautions Precautions: Fall Precaution Comments: severe L sided neglect Restrictions Weight Bearing Restrictions: No  Pain: Pain Assessment Pain Assessment: No/denies pain  Locomotion : Ambulation Ambulation/Gait Assistance: 3: Mod assist    See FIM for current functional status  Therapy/Group: Individual Therapy  Elder Love M 08/01/2014, 11:35 AM

## 2014-08-02 ENCOUNTER — Inpatient Hospital Stay (HOSPITAL_COMMUNITY): Payer: BLUE CROSS/BLUE SHIELD | Admitting: Speech Pathology

## 2014-08-02 ENCOUNTER — Inpatient Hospital Stay (HOSPITAL_COMMUNITY): Payer: BLUE CROSS/BLUE SHIELD | Admitting: Occupational Therapy

## 2014-08-02 ENCOUNTER — Inpatient Hospital Stay (HOSPITAL_COMMUNITY): Payer: BLUE CROSS/BLUE SHIELD | Admitting: Physical Therapy

## 2014-08-02 LAB — GLUCOSE, CAPILLARY
GLUCOSE-CAPILLARY: 183 mg/dL — AB (ref 70–99)
Glucose-Capillary: 112 mg/dL — ABNORMAL HIGH (ref 70–99)
Glucose-Capillary: 138 mg/dL — ABNORMAL HIGH (ref 70–99)
Glucose-Capillary: 109 mg/dL — ABNORMAL HIGH (ref 70–99)

## 2014-08-02 NOTE — Progress Notes (Signed)
64 y.o. right handed female with history of tobacco abuse, hypertension. Patient lives alone independently prior to admission and working full time. Admitted 07/24/2014 after a fall noting left-sided weakness. No head trauma no loss of consciousness. MRI of the brain shows multifocal patchy acute right MCA territory infarcts. Additional 8 mm cortical infarct within the parasagittal cortical gray matter of the anterior right frontal lobe, likely right ACA distribution. MRA of the head with right MCA M2 inferior division occlusion. CT angiogram chest negative except for a 6 mm pulmonary nodule on the right with recommendation follow-up CT the chest in 6-12 months. Elevated troponin 4.17. Echocardiogram with ejection fraction of 70% and grade 1 diastolic dysfunction. Carotid Doppler with left 50-60% and right 80% ICA stenosis. Patient did not receive TPA. Vascular surgery Dr. Oneida Alar consulted in relation to ICA stenosis plan follow-up as an outpatient to proceed with possible CEA. Follow-up cardiology for elevated troponin not felt to be due to ACS per Dr. Burt Knack and no further workup was indicated. Neurology consulted presently on aspirin and Plavix for CVA prophylaxis for 3 months then Plavix alone   Subjective/Complaints:  Working with SLP, no new issues, left neglect is apparant Review of systems positive for left upper extremity weakness, no bowel or bladder difficulties, no breathing problems, otherwise negative  Objective: Vital Signs: Blood pressure 115/67, pulse 70, temperature 98.5 F (36.9 C), temperature source Oral, resp. rate 20, height 5' 2.5" (1.588 m), weight 94 kg (207 lb 3.7 oz), SpO2 96 %. No results found. Results for orders placed or performed during the hospital encounter of 07/30/14 (from the past 72 hour(s))  CBC     Status: Abnormal   Collection Time: 07/30/14  4:05 PM  Result Value Ref Range   WBC 13.1 (H) 4.0 - 10.5 K/uL   RBC 5.32 (H) 3.87 - 5.11 MIL/uL   Hemoglobin 15.4  (H) 12.0 - 15.0 g/dL   HCT 45.9 36.0 - 46.0 %   MCV 86.3 78.0 - 100.0 fL   MCH 28.9 26.0 - 34.0 pg   MCHC 33.6 30.0 - 36.0 g/dL   RDW 15.5 11.5 - 15.5 %   Platelets 291 150 - 400 K/uL  Creatinine, serum     Status: None   Collection Time: 07/30/14  4:05 PM  Result Value Ref Range   Creatinine, Ser 0.92 0.44 - 1.00 mg/dL   GFR calc non Af Amer >60 >60 mL/min   GFR calc Af Amer >60 >60 mL/min    Comment: (NOTE) The eGFR has been calculated using the CKD EPI equation. This calculation has not been validated in all clinical situations. eGFR's persistently <90 mL/min signify possible Chronic Kidney Disease.   Glucose, capillary     Status: Abnormal   Collection Time: 07/30/14  5:15 PM  Result Value Ref Range   Glucose-Capillary 149 (H) 70 - 99 mg/dL  Glucose, capillary     Status: Abnormal   Collection Time: 07/30/14  9:03 PM  Result Value Ref Range   Glucose-Capillary 134 (H) 70 - 99 mg/dL  Glucose, capillary     Status: Abnormal   Collection Time: 07/31/14  6:57 AM  Result Value Ref Range   Glucose-Capillary 158 (H) 70 - 99 mg/dL  CBC WITH DIFFERENTIAL     Status: Abnormal   Collection Time: 07/31/14  7:16 AM  Result Value Ref Range   WBC 12.1 (H) 4.0 - 10.5 K/uL   RBC 5.25 (H) 3.87 - 5.11 MIL/uL   Hemoglobin 15.2 (H) 12.0 -  15.0 g/dL   HCT 45.5 36.0 - 46.0 %   MCV 86.7 78.0 - 100.0 fL   MCH 29.0 26.0 - 34.0 pg   MCHC 33.4 30.0 - 36.0 g/dL   RDW 15.5 11.5 - 15.5 %   Platelets 257 150 - 400 K/uL   Neutrophils Relative % 65 43 - 77 %   Neutro Abs 8.0 (H) 1.7 - 7.7 K/uL   Lymphocytes Relative 21 12 - 46 %   Lymphs Abs 2.6 0.7 - 4.0 K/uL   Monocytes Relative 8 3 - 12 %   Monocytes Absolute 1.0 0.1 - 1.0 K/uL   Eosinophils Relative 5 0 - 5 %   Eosinophils Absolute 0.6 0.0 - 0.7 K/uL   Basophils Relative 1 0 - 1 %   Basophils Absolute 0.1 0.0 - 0.1 K/uL  Comprehensive metabolic panel     Status: Abnormal   Collection Time: 07/31/14  7:16 AM  Result Value Ref Range    Sodium 138 135 - 145 mmol/L   Potassium 4.0 3.5 - 5.1 mmol/L   Chloride 103 101 - 111 mmol/L   CO2 23 22 - 32 mmol/L   Glucose, Bld 168 (H) 70 - 99 mg/dL   BUN 17 6 - 20 mg/dL   Creatinine, Ser 0.90 0.44 - 1.00 mg/dL   Calcium 9.1 8.9 - 10.3 mg/dL   Total Protein 6.2 (L) 6.5 - 8.1 g/dL   Albumin 3.2 (L) 3.5 - 5.0 g/dL   AST 25 15 - 41 U/L   ALT 24 14 - 54 U/L   Alkaline Phosphatase 77 38 - 126 U/L   Total Bilirubin 0.8 0.3 - 1.2 mg/dL   GFR calc non Af Amer >60 >60 mL/min   GFR calc Af Amer >60 >60 mL/min    Comment: (NOTE) The eGFR has been calculated using the CKD EPI equation. This calculation has not been validated in all clinical situations. eGFR's persistently <90 mL/min signify possible Chronic Kidney Disease.    Anion gap 12 5 - 15  Glucose, capillary     Status: Abnormal   Collection Time: 07/31/14 11:20 AM  Result Value Ref Range   Glucose-Capillary 147 (H) 70 - 99 mg/dL   Comment 1 Notify RN   Glucose, capillary     Status: Abnormal   Collection Time: 07/31/14  4:35 PM  Result Value Ref Range   Glucose-Capillary 136 (H) 70 - 99 mg/dL   Comment 1 Notify RN   Glucose, capillary     Status: Abnormal   Collection Time: 07/31/14  9:27 PM  Result Value Ref Range   Glucose-Capillary 155 (H) 70 - 99 mg/dL  Glucose, capillary     Status: Abnormal   Collection Time: 08/01/14  6:55 AM  Result Value Ref Range   Glucose-Capillary 163 (H) 70 - 99 mg/dL  Glucose, capillary     Status: Abnormal   Collection Time: 08/01/14 12:11 PM  Result Value Ref Range   Glucose-Capillary 134 (H) 70 - 99 mg/dL  Glucose, capillary     Status: Abnormal   Collection Time: 08/01/14  4:35 PM  Result Value Ref Range   Glucose-Capillary 155 (H) 70 - 99 mg/dL  Glucose, capillary     Status: Abnormal   Collection Time: 08/01/14  9:41 PM  Result Value Ref Range   Glucose-Capillary 142 (H) 70 - 99 mg/dL   Comment 1 Notify RN    Comment 2 Document in Chart   Glucose, capillary     Status:  Abnormal   Collection Time: 08/02/14  7:03 AM  Result Value Ref Range   Glucose-Capillary 183 (H) 70 - 99 mg/dL   Comment 1 Notify RN    Comment 2 Document in Chart      HEENT: normal Cardio: RRR and no murmurs Resp: CTA B/L and unlabored GI: BS positive and nontender nondistended Extremity:  Pulses positive and No Edema Skin:   Intact Neuro: Alert/Oriented, Cranial Nerve II-XII normal, Abnormal Sensory reduced left upper extremity,  does withdraw weakly to pinch, Abnormal Motor 2 minus biceps as well as shoulder retraction, 3- abduction, 0 at the wrist and trace fingers,  left lower extremity 4/5 and hip flexor and extensor ankle dorsi flexor and Other , there is evidence of neglect Musc/Skel:  Other no pain with left shoulder range of motion Gen. no acute distress   Assessment/Plan: 1. Functional deficits secondary to right MCA infarct causing left hemiparesis upper greater than lower which require 3+ hours per day of interdisciplinary therapy in a comprehensive inpatient rehab setting. Physiatrist is providing close team supervision and 24 hour management of active medical problems listed below. Physiatrist and rehab team continue to assess barriers to discharge/monitor patient progress toward functional and medical goals. FIM: FIM - Bathing Bathing Steps Patient Completed: Chest, Left Arm, Abdomen, Front perineal area, Buttocks, Right upper leg, Left upper leg, Right lower leg (including foot), Left lower leg (including foot) Bathing: 4: Min-Patient completes 8-9 60f10 parts or 75+ percent  FIM - Upper Body Dressing/Undressing Upper body dressing/undressing steps patient completed: Thread/unthread right sleeve of pullover shirt/dresss, Put head through opening of pull over shirt/dress, Thread/unthread right bra strap, Thread/unthread left bra strap Upper body dressing/undressing: 3: Mod-Patient completed 50-74% of tasks FIM - Lower Body Dressing/Undressing Lower body  dressing/undressing steps patient completed: Thread/unthread right pants leg, Thread/unthread right underwear leg Lower body dressing/undressing: 2: Max-Patient completed 25-49% of tasks  FIM - Toileting Toileting steps completed by patient: Adjust clothing prior to toileting, Performs perineal hygiene Toileting Assistive Devices: Grab bar or rail for support Toileting: 3: Mod-Patient completed 2 of 3 steps  FIM - TRadio producerDevices: Grab bars Toilet Transfers: 4-To toilet/BSC: Min A (steadying Pt. > 75%), 4-From toilet/BSC: Min A (steadying Pt. > 75%)  FIM - Bed/Chair Transfer Bed/Chair Transfer: 4: Bed > Chair or W/C: Min A (steadying Pt. > 75%), 4: Chair or W/C > Bed: Min A (steadying Pt. > 75%)  FIM - Locomotion: Wheelchair Distance: 50 Locomotion: Wheelchair: 1: Travels less than 50 ft with moderate assistance (Pt: 50 - 74%) FIM - Locomotion: Ambulation Locomotion: Ambulation Assistive Devices: CNurse, adultAmbulation/Gait Assistance: 3: Mod assist Locomotion: Ambulation: 2: Travels 50 - 149 ft with moderate assistance (Pt: 50 - 74%)  Comprehension Comprehension Mode: Auditory Comprehension: 6-Follows complex conversation/direction: With extra time/assistive device  Expression Expression Mode: Verbal Expression: 5-Expresses basic 90% of the time/requires cueing < 10% of the time.  Social Interaction Social Interaction: 7-Interacts appropriately with others - No medications needed.  Problem Solving Problem Solving: 4-Solves basic 75 - 89% of the time/requires cueing 10 - 24% of the time  Memory Memory: 4-Recognizes or recalls 75 - 89% of the time/requires cueing 10 - 24% of the time  Medical Problem List and Plan: 1. Functional deficits secondary to right MCA infarct causing left hemiparesis and Left hemisensory deficits 2.  DVT Prophylaxis/Anticoagulation: Subcutaneous heparin. Monitor platelet counts and any signs of bleeding 3. Pain  Management: Tylenol as needed 4. Dysphagia. Dysphagia #  2 nectar liquids. Monitor for any aspiration. Follow-up speech therapy 5. Neuropsych: This patient is capable of making decisions on her own behalf. 6. Skin/Wound Care: Routine skin checks 7. Fluids/Electrolytes/Nutrition: Strict I and O's with follow-up chemistries 8. Hypertension. Hydrochlorothiazide 12.5 mg daily, lisinopril 5 mg daily. Monitor with increased mobility 9. Right ICA stenosis. Follow-up vascular surgery Dr. Oneida Alar to address CEA in 2-3 weeks 10. 6 mm pulmonary nodule identified on CT angiogram of chest. Recommendations follow-up CT of the chest 6-12 months 11. New findings diabetes mellitus. Hemoglobin A1c 8.5. Glucophage 500 mg twice a day. Check blood sugars before meals and at bedtime. Provide diabetic teaching 12. Hypothyroidism. Synthroid. TSH 2.151 13. Hyperlipidemia. Lipitor   LOS (Days) 3 A FACE TO FACE EVALUATION WAS PERFORMED  KIRSTEINS,ANDREW E 08/02/2014, 9:10 AM

## 2014-08-02 NOTE — Progress Notes (Signed)
Occupational Therapy Session Note  Patient Details  Name: Carla Hopkins MRN: [id number] Date of Birth: 24-Jul-1950  Today's Date: 08/02/2014 OT Individual Time: 1330-1415 OT Individual Time Calculation (min): 45 min    Short Term Goals: Week 1:  OT Short Term Goal 1 (Week 1): Pt will bathe with min A. OT Short Term Goal 2 (Week 1): Pt will toilet with min A. OT Short Term Goal 3 (Week 1): Pt will don shirt min A. OT Short Term Goal 4 (Week 1): Pt will don pants with mod A. OT Short Term Goal 5 (Week 1): Pt will be independent with self ROM of LUE.  Skilled Therapeutic Interventions/Progress Updates:   Patient seen this afternoon for continued neuromuscular re-education with emphasis on functional mobility, postural control, activity tolerance, strength, ROM, and overall endurance.  Patient upright in recliner in hospital room upon therapist arrival.  Transfers chair to/from wheelchair with min assist stand step transfer without device.  Patient completes transfer wheelchair to/from therapy mat with min assist stand pivot transfer.  Patient completes edge of mat weightbearing activities through L forearm through elbow/shoulder, as well as through L wrist with facilitation through wrist/elbow.  Patient educated on purpose of WB during session.  Patient completes RUE ROM/attention task while weightbearing through L forearm.  Mirror used for Warden/ranger.  Patient completes transfer EOB to sidelying with min assist for LLE onto mat.  Transfers sidelying to supine and then to prone with CGA to SBA.  Patient completes prone on mat with WB through B forearms x 3 minutes.  Patient defers attempt at quadriped position.  Patient returns to [facility] with min assist for trunk.   Patient completes LUE AAROM chest press, 3 sets x 10 reps seated EOM.  Patient attempts wrist extension against gravity with therapist blocking arm to isolate movement.  Trace movement noted x 3 trials of multiple attempts.  Patient  also with ring finger movement x 1 trial.  Muscle tapping attempted to elicit contraction; poor motor planning noted during task.  Patient highly distractible in therapy gym.  Patient completes transfer back to wheelchair and then to recliner at end of session.  Good tolerance for session and good participation.     Therapy Documentation Precautions:  Precautions Precautions: Fall Precaution Comments: severe L sided inattention Restrictions Weight Bearing Restrictions: No Vital Signs: Therapy Vitals Temp: 98.1 F (36.7 C) Temp Source: Oral Pulse Rate: 74 Resp: 18 BP: (!) 94/57 mmHg Patient Position (if appropriate): Sitting Oxygen Therapy SpO2: 94 % O2 Device: Not Delivered Pain: Pain Assessment Pain Assessment: No/denies pain ADL: ADL ADL Comments: Refer to FIM  See FIM for current functional status  Therapy/Group: Individual Therapy  Osa Craver 08/02/2014, 3:43 PM

## 2014-08-02 NOTE — Progress Notes (Signed)
Occupational Therapy Session Note  Patient Details  Name: Carla Hopkins MRN: [id number] Date of Birth: [birth year]  Today's Date: 08/02/2014 OT Individual Time: 1100-1200 OT Individual Time Calculation (min): 60 min    Short Term Goals: Week 1:  OT Short Term Goal 1 (Week 1): Pt will bathe with min A. OT Short Term Goal 2 (Week 1): Pt will toilet with min A. OT Short Term Goal 3 (Week 1): Pt will don shirt min A. OT Short Term Goal 4 (Week 1): Pt will don pants with mod A. OT Short Term Goal 5 (Week 1): Pt will be independent with self ROM of LUE.  Skilled Therapeutic Interventions/Progress Updates:   Patient seen for ADL retraining this morning with emphasis on functional use of LUE, neuromuscular re-education, hemibathing/hemidressing techniques, standing balance, standing tolerance, postural control, safety and education/discharge planning.  Patient agreeable to treatment.  Patient transfers wheelchair to/from transfer tub bench in shower with min assist stand pivot transfer with verbal cues for attention to LUE during transfer and use of grab bars for safety.  Patient completes bathing with minimum assistance for standing balance for washing peri area/buttocks.  Patient instructed in use of washcloth on R upper leg for stabilization and washing RUE on washcloth in seated position.  Patient with good participation and performance.  Patient completes dressing wheelchair level at the sink.  Max assist upper body dressing with overhead sports bra and overhead shirt.  Patient able to perform hemidressing techniques with max verbal cues.  Patient completes LB dressing with max assist.  Requires verbal cues for donning clothing on affected limb initially and then non-affected limb.  Patient sit to/from stand min assist.  Max assist for clothing management over hips with max assist verbal cues for attending to L side.  Patient grooms at sink with setup to open containers and for safety.   Patient  completes LUE neuromuscular re-education in therapy gym for LUE strengthening.  1# towel glides completed for horizontal abduction/adduction and scapular protraction/retraction, 3 sets x 10 reps.  Poor motor planning, organization and sequencing noted during ADL and neuromuscular re-education tasks.  Patient motivated and reports feeling fearful of upcoming surgery on Tuesday.  Good participation.   Therapy Documentation Precautions:  Precautions Precautions: Fall Precaution Comments: severe L sided neglect Restrictions Weight Bearing Restrictions: No Pain: Pain Assessment Pain Assessment: No/denies pain ADL: ADL ADL Comments: Refer to FIM  See FIM for current functional status  Therapy/Group: Individual Therapy  Osa Craver 08/02/2014, 12:48 PM

## 2014-08-02 NOTE — Progress Notes (Signed)
Speech Language Pathology Daily Session Note  Patient Details  Name: Carla Hopkins MRN: [id number] Date of Birth: 06/18/1950  Today's Date: 08/02/2014 SLP Individual Time: 4034-7425 SLP Individual Time Calculation (min): 45 min  Short Term Goals: Week 1: SLP Short Term Goal 1 (Week 1): Pt will attend to left body/environment during semi-complex functional tasks with min assist verbal cues SLP Short Term Goal 2 (Week 1): Pt will complete semi-complex tasks with min assist verbal cues for functional problem solving.   SLP Short Term Goal 3 (Week 1): Pt will return demonstration of oral motor strengthening exercises targeting left sided weakness with supervision.  SLP Short Term Goal 4 (Week 1): Pt will utilize slow rate and overarticulation to achieve intelligibility in conversations with supervision.  SLP Short Term Goal 5 (Week 1): Pt will consume regular textures and thin liquids with mod I self monitoring and correction of left sided buccal residue.    Skilled Therapeutic Interventions:  Pt was seen for skilled ST targeting cognitive goals.  Upon arrival, pt was seated upright in wheelchair, awake, alert, and agreeable to participate in ST.  SLP administered remaining portions of the ALFA standardized assessment of cognition.  Pt continues to demonstrate impulsivity and left inattention during tasks which impact her ability to complete home management tasks independently.  Pt required max assist verbal cues to recognize and correct errors when completing a medication management task for a 4x daily med (pt initially placed all 4 pills into one time slot; when cued to correct error, she then placed 1 pill into 2 time slots and 2 pills in one time slot).  SLP was able to fade cues to supervision on consecutive trials with skilled compensatory strategy instruction for organization.  Pt read and followed directions with mod assist verbal and visual cues for use of finger tracking when reading to  facilitate consistent visual scanning to the left of midline.  Pt and pt's daughter presented with questions regarding rationale behind SLP goals and intervention.  SLP provided skilled RE-education about how her cognitive deficits will impact her ability to complete home management tasks successfully and independently.  SLP continued to recommend 24/7 supervision, which pt endorses that she does not have.  If pt were to go home with only intermittent supervision, SLP recommends that pt have assistance at least for medication and financial management.  Pt and daughter aware and in agreement with recommendation.  Continue per current plan of care.    FIM:  Comprehension Comprehension Mode: Auditory Comprehension: 6-Follows complex conversation/direction: With extra time/assistive device Expression Expression Mode: Verbal Expression: 5-Expresses basic 90% of the time/requires cueing < 10% of the time. Social Interaction Social Interaction: 7-Interacts appropriately with others - No medications needed. Problem Solving Problem Solving: 4-Solves basic 75 - 89% of the time/requires cueing 10 - 24% of the time Memory Memory: 4-Recognizes or recalls 75 - 89% of the time/requires cueing 10 - 24% of the time  Pain Pain Assessment Pain Assessment: No/denies pain  Therapy/Group: Individual Therapy  Trudee Chirino, Selinda Orion 08/02/2014, 12:28 PM

## 2014-08-02 NOTE — Progress Notes (Signed)
Physical Therapy Session Note  Patient Details  Name: Carla Hopkins MRN: [id number] Date of Birth: April 04, 1950  Today's Date: 08/02/2014 PT Individual Time: 1445-1530 PT Individual Time Calculation (min): 45 min   Skilled Therapeutic Interventions/Progress Updates:   Pt limited by decreased L sided attention and safety attention throughout session. Pt demonstrates improvement initiating gait without AD. Improved L lateral L/S flexor activation noted post L/S soft tissue stretch. Pt would continue to benefit from skilled PT services to increase functional mobility.  Therapy Documentation Precautions:  Precautions Precautions: Fall Precaution Comments: severe L sided inattention Restrictions Weight Bearing Restrictions: No Vital Signs: Therapy Vitals Temp: 98.1 F (36.7 C) Temp Source: Oral Pulse Rate: 74 Resp: 18 BP: (!) 94/57 mmHg Patient Position (if appropriate): Sitting Oxygen Therapy SpO2: 94 % O2 Device: Not Delivered Pain: Pain Assessment Pain Assessment: No/denies pain Mobility:  Pt performs transfers with Min A with cues for hand/LE placement, safety, and attention Locomotion : Ambulation Ambulation/Gait Assistance: 3: Mod assist with no AD 102' with cues posture, pacing Other Treatments:  Pt educated on rehab plan, safety in mobility, L side attention, and forced use. Pt performs standing balance static 1'x3. Pt performs transfer training x20 in session. Toe taps on 2x10 BLE on 4 inch stairs. Pre-gait BLE advancement 2x10. Pt performs forced use of LUE in sitting with lateral and superior reaching 2x10 each. Seated rest unsupported with LUE forced use. L lateral flexion soft tissue stretch 3x30". L lateral L/S flexion AROM 2x10  See FIM for current functional status  Therapy/Group: Individual Therapy  Monia Pouch 08/02/2014, 3:08 PM

## 2014-08-03 ENCOUNTER — Inpatient Hospital Stay (HOSPITAL_COMMUNITY): Payer: BLUE CROSS/BLUE SHIELD | Admitting: Occupational Therapy

## 2014-08-03 DIAGNOSIS — R414 Neurologic neglect syndrome: Secondary | ICD-10-CM

## 2014-08-03 LAB — GLUCOSE, CAPILLARY
GLUCOSE-CAPILLARY: 148 mg/dL — AB (ref 70–99)
Glucose-Capillary: 167 mg/dL — ABNORMAL HIGH (ref 70–99)
Glucose-Capillary: 116 mg/dL — ABNORMAL HIGH (ref 70–99)
Glucose-Capillary: 114 mg/dL — ABNORMAL HIGH (ref 70–99)

## 2014-08-03 MED ORDER — CITALOPRAM HYDROBROMIDE 10 MG PO TABS
10.0000 mg | ORAL_TABLET | Freq: Every day | ORAL | Status: DC
  Administered 2014-08-03 – 2014-08-04 (×2): 10 mg via ORAL
  Filled 2014-08-03 (×4): qty 1

## 2014-08-03 NOTE — Progress Notes (Signed)
Occupational Therapy Session Note  Patient Details  Name: Carla Hopkins MRN: [id number] Date of Birth: Aug 05, 1950  Today's Date: 08/03/2014 OT Individual Time: 1715-1755 OT Individual Time Calculation (min): 40 min    Short Term Goals: Week 1:  OT Short Term Goal 1 (Week 1): Pt will bathe with min A. OT Short Term Goal 2 (Week 1): Pt will toilet with min A. OT Short Term Goal 3 (Week 1): Pt will don shirt min A. OT Short Term Goal 4 (Week 1): Pt will don pants with mod A. OT Short Term Goal 5 (Week 1): Pt will be independent with self ROM of LUE. Week 2:     Skilled Therapeutic Interventions/Progress Updates:      Addressed LUE with AAROM, positioning and exercises for pt to carry out when not in therapy.  Perfomed AAROM to all quadrants in comfortable range.  Pt tolerated with no pain.  Has some bicep activation with gravity eliminated.  Pt concerned about her procedure on Tues.  Provided encouragement and comfort.  Provided rolling table for positioning LUE with education to slide arm forward and backward when not in therapy instead of it resting on her thigh.  Pt. Concurred.  Left pt in wc with  call bell,phone within reach.     Therapy Documentation Precautions:  Precautions Precautions: Fall Precaution Comments: severe L sided inattention Restrictions Weight Bearing Restrictions: No      Pain:  none   ADL: ADL ADL Comments: Refer to FIM     See FIM for current functional status  Therapy/Group: Individual Therapy  Lisa Roca 08/03/2014, 6:07 PM

## 2014-08-03 NOTE — Progress Notes (Signed)
64 y.o. right handed female with history of tobacco abuse, hypertension. Patient lives alone independently prior to admission and working full time. Admitted 07/24/2014 after a fall noting left-sided weakness. No head trauma no loss of consciousness. MRI of the brain shows multifocal patchy acute right MCA territory infarcts. Additional 8 mm cortical infarct within the parasagittal cortical gray matter of the anterior right frontal lobe, likely right ACA distribution. MRA of the head with right MCA M2 inferior division occlusion. CT angiogram chest negative except for a 6 mm pulmonary nodule on the right with recommendation follow-up CT the chest in 6-12 months. Elevated troponin 4.17. Echocardiogram with ejection fraction of 70% and grade 1 diastolic dysfunction. Carotid Doppler with left 50-60% and right 80% ICA stenosis. Patient did not receive TPA. Vascular surgery Dr. Oneida Alar consulted in relation to ICA stenosis plan follow-up as an outpatient to proceed with possible CEA. Follow-up cardiology for elevated troponin not felt to be due to ACS per Dr. Burt Knack and no further workup was indicated. Neurology consulted presently on aspirin and Plavix for CVA prophylaxis for 3 months then Plavix alone   Subjective/Complaints:  One therapy today, would like grounds pass Review of systems positive for left upper extremity weakness, no bowel or bladder difficulties, no breathing problems, otherwise negative  Objective: Vital Signs: Blood pressure 104/67, pulse 75, temperature 98.2 F (36.8 C), temperature source Oral, resp. rate 18, height 5' 2.5" (1.588 m), weight 94 kg (207 lb 3.7 oz), SpO2 96 %. No results found. Results for orders placed or performed during the hospital encounter of 07/30/14 (from the past 72 hour(s))  Glucose, capillary     Status: Abnormal   Collection Time: 07/31/14 11:20 AM  Result Value Ref Range   Glucose-Capillary 147 (H) 70 - 99 mg/dL   Comment 1 Notify RN   Glucose,  capillary     Status: Abnormal   Collection Time: 07/31/14  4:35 PM  Result Value Ref Range   Glucose-Capillary 136 (H) 70 - 99 mg/dL   Comment 1 Notify RN   Glucose, capillary     Status: Abnormal   Collection Time: 07/31/14  9:27 PM  Result Value Ref Range   Glucose-Capillary 155 (H) 70 - 99 mg/dL  Glucose, capillary     Status: Abnormal   Collection Time: 08/01/14  6:55 AM  Result Value Ref Range   Glucose-Capillary 163 (H) 70 - 99 mg/dL  Glucose, capillary     Status: Abnormal   Collection Time: 08/01/14 12:11 PM  Result Value Ref Range   Glucose-Capillary 134 (H) 70 - 99 mg/dL  Glucose, capillary     Status: Abnormal   Collection Time: 08/01/14  4:35 PM  Result Value Ref Range   Glucose-Capillary 155 (H) 70 - 99 mg/dL  Glucose, capillary     Status: Abnormal   Collection Time: 08/01/14  9:41 PM  Result Value Ref Range   Glucose-Capillary 142 (H) 70 - 99 mg/dL   Comment 1 Notify RN    Comment 2 Document in Chart   Glucose, capillary     Status: Abnormal   Collection Time: 08/02/14  7:03 AM  Result Value Ref Range   Glucose-Capillary 183 (H) 70 - 99 mg/dL   Comment 1 Notify RN    Comment 2 Document in Chart   Glucose, capillary     Status: Abnormal   Collection Time: 08/02/14 12:01 PM  Result Value Ref Range   Glucose-Capillary 112 (H) 70 - 99 mg/dL  Glucose, capillary  Status: Abnormal   Collection Time: 08/02/14  4:31 PM  Result Value Ref Range   Glucose-Capillary 138 (H) 70 - 99 mg/dL   Comment 1 Notify RN   Glucose, capillary     Status: Abnormal   Collection Time: 08/02/14  8:47 PM  Result Value Ref Range   Glucose-Capillary 109 (H) 70 - 99 mg/dL  Glucose, capillary     Status: Abnormal   Collection Time: 08/03/14  6:58 AM  Result Value Ref Range   Glucose-Capillary 167 (H) 70 - 99 mg/dL     HEENT: normal Cardio: RRR and no murmurs Resp: CTA B/L and unlabored GI: BS positive and nontender nondistended Extremity:  Pulses positive and No Edema Skin:    Intact Neuro: Alert/Oriented, Cranial Nerve II-XII normal, Abnormal Sensory reduced left upper extremity,  does withdraw weakly to pinch, Abnormal Motor 2 minus biceps as well as shoulder retraction, 3- abduction, 0 at the wrist and trace fingers,  left lower extremity 4/5 and hip flexor and extensor ankle dorsi flexor and Other , there is evidence of neglect Musc/Skel:  Other no pain with left shoulder range of motion Gen. no acute distress   Assessment/Plan: 1. Functional deficits secondary to right MCA infarct causing left hemiparesis upper greater than lower which require 3+ hours per day of interdisciplinary therapy in a comprehensive inpatient rehab setting. Physiatrist is providing close team supervision and 24 hour management of active medical problems listed below. Physiatrist and rehab team continue to assess barriers to discharge/monitor patient progress toward functional and medical goals. FIM: FIM - Bathing Bathing Steps Patient Completed: Chest, Right Arm, Left Arm, Abdomen, Buttocks, Right upper leg, Right lower leg (including foot), Left upper leg, Left lower leg (including foot), Front perineal area Bathing: 4: Steadying assist  FIM - Upper Body Dressing/Undressing Upper body dressing/undressing steps patient completed: Thread/unthread right sleeve of front closure shirt/dress Upper body dressing/undressing: 2: Max-Patient completed 25-49% of tasks FIM - Lower Body Dressing/Undressing Lower body dressing/undressing steps patient completed: Thread/unthread right pants leg, Thread/unthread right underwear leg Lower body dressing/undressing: 2: Max-Patient completed 25-49% of tasks  FIM - Toileting Toileting steps completed by patient: Adjust clothing prior to toileting, Performs perineal hygiene Toileting Assistive Devices: Grab bar or rail for support Toileting: 3: Mod-Patient completed 2 of 3 steps  FIM - Radio producer Devices: Grab  bars Toilet Transfers: 4-To toilet/BSC: Min A (steadying Pt. > 75%), 4-From toilet/BSC: Min A (steadying Pt. > 75%)  FIM - Bed/Chair Transfer Bed/Chair Transfer: 4: Bed > Chair or W/C: Min A (steadying Pt. > 75%), 4: Chair or W/C > Bed: Min A (steadying Pt. > 75%)  FIM - Locomotion: Wheelchair Distance: 50 Locomotion: Wheelchair: 0: Activity did not occur FIM - Locomotion: Ambulation Locomotion: Ambulation Assistive Devices:  (no AD) Ambulation/Gait Assistance: 3: Mod assist Locomotion: Ambulation: 1: Travels less than 50 ft with moderate assistance (Pt: 50 - 74%)  Comprehension Comprehension Mode: Auditory Comprehension: 6-Follows complex conversation/direction: With extra time/assistive device  Expression Expression Mode: Verbal Expression: 6-Expresses complex ideas: With extra time/assistive device  Social Interaction Social Interaction: 6-Interacts appropriately with others with medication or extra time (anti-anxiety, antidepressant).  Problem Solving Problem Solving: 4-Solves basic 75 - 89% of the time/requires cueing 10 - 24% of the time  Memory Memory: 4-Recognizes or recalls 75 - 89% of the time/requires cueing 10 - 24% of the time  Medical Problem List and Plan: 1. Functional deficits secondary to right MCA infarct causing left hemiparesis and Left  hemisensory deficits 2.  DVT Prophylaxis/Anticoagulation: Subcutaneous heparin. Monitor platelet counts and any signs of bleeding 3. Pain Management: Tylenol as needed 4. Dysphagia. Dysphagia #2 nectar liquids. Monitor for any aspiration. Follow-up speech therapy 5. Neuropsych: This patient is capable of making decisions on her own behalf. 6. Skin/Wound Care: Routine skin checks 7. Fluids/Electrolytes/Nutrition: Strict I and O's with follow-up chemistries 8. Hypertension. Hydrochlorothiazide 12.5 mg daily, lisinopril 5 mg daily. Monitor with increased mobility 9. Right ICA stenosis. Follow-up vascular surgery Dr. Oneida Alar to  address CEA in 2-3 weeks 10. 6 mm pulmonary nodule identified on CT angiogram of chest. Recommendations follow-up CT of the chest 6-12 months 11. New findings diabetes mellitus. Hemoglobin A1c 8.5. Glucophage 500 mg twice a day. Check blood sugars before meals and at bedtime. Provide diabetic teaching 12. Hypothyroidism. Synthroid. TSH 2.151 13. Hyperlipidemia. Lipitor   LOS (Days) 4 A FACE TO FACE EVALUATION WAS PERFORMED  KIRSTEINS,ANDREW E 08/03/2014, 9:17 AM

## 2014-08-04 ENCOUNTER — Encounter (HOSPITAL_COMMUNITY): Payer: BLUE CROSS/BLUE SHIELD | Admitting: Occupational Therapy

## 2014-08-04 ENCOUNTER — Inpatient Hospital Stay (HOSPITAL_COMMUNITY): Payer: BLUE CROSS/BLUE SHIELD

## 2014-08-04 ENCOUNTER — Inpatient Hospital Stay (HOSPITAL_COMMUNITY): Payer: BLUE CROSS/BLUE SHIELD | Admitting: Speech Pathology

## 2014-08-04 LAB — GLUCOSE, CAPILLARY
GLUCOSE-CAPILLARY: 116 mg/dL — AB (ref 70–99)
GLUCOSE-CAPILLARY: 160 mg/dL — AB (ref 70–99)
Glucose-Capillary: 146 mg/dL — ABNORMAL HIGH (ref 70–99)
Glucose-Capillary: 116 mg/dL — ABNORMAL HIGH (ref 70–99)

## 2014-08-04 MED ORDER — CHLORHEXIDINE GLUCONATE CLOTH 2 % EX PADS
6.0000 | MEDICATED_PAD | Freq: Once | CUTANEOUS | Status: AC
Start: 2014-08-05 — End: 2014-08-05
  Administered 2014-08-05: 6 via TOPICAL

## 2014-08-04 NOTE — Discharge Summary (Signed)
Discharge summary job [id number]

## 2014-08-04 NOTE — Progress Notes (Signed)
Occupational Therapy Session Note  Patient Details  Name: Carla Hopkins MRN: [id number] Date of Birth: [birth year]  Today's Date: 08/04/2014 OT Individual Time: 0903-1001 OT Individual Time Calculation (min): 58 min    Short Term Goals: Week 1:  OT Short Term Goal 1 (Week 1): Pt will bathe with min A. OT Short Term Goal 2 (Week 1): Pt will toilet with min A. OT Short Term Goal 3 (Week 1): Pt will don shirt min A. OT Short Term Goal 4 (Week 1): Pt will don pants with mod A. OT Short Term Goal 5 (Week 1): Pt will be independent with self ROM of LUE.  Skilled Therapeutic Interventions/Progress Updates:    Pt began session by donning shorts, socks, and shoes.  She reported not being comfortable with a man assisting with bathing and dressing so this was passed on to nursing.  Pt unaware that she had already donned underpants and attempted to donn a second pair.  Along with this she needed mod instructional cueing to begin dressing with the LLE first.  Mod demonstrational cueing to cross the LLE over the right knee and donn sock and shoe.  She was able to donn the right shoe but not able to tie either one.  Once finished transitioned to the therapy gym for further work on LUE function.  Performed weightbearing tasks in sitting with the LUE positioned beside of her on the mat.  She as able to initiate some elbow extension in the LUE to assist with reciprical scooting with min assist.  However, at times pt reported increased bicep pain as she does exhibit some increased flexor tone in the biceps.  Educated pt to work on moving the LUE with slow controlled movements of internal and external rotation without shoulder hike.  Pt transferred back to the wheelchair with min assist stand pivot and returned to room with daughter present.   Therapy Documentation Precautions:  Precautions Precautions: Fall Precaution Comments: severe L sided inattention Restrictions Weight Bearing Restrictions:  No  Pain: Pain Assessment Pain Assessment: No/denies pain ADL: ADL ADL Comments: Refer to FIM  See FIM for current functional status  Therapy/Group: Individual Therapy  Iosefa Weintraub OTR/L 08/04/2014, 12:27 PM

## 2014-08-04 NOTE — Progress Notes (Signed)
Social Work Patient ID: Carla Hopkins, female   DOB: [birth year], 64 y.o.   MRN: [id number] Met with daughter and pt to discuss the plan for tomorrow, aware will stay on acute to be monitored after procedure and then be re-evaluated for re-admission to CIR. Daughter feels pt will need to return to CIR to complete her rehab program and reach her goals.  Will have notorary in pt's room at 12;15 to notorize her durable POA. Daughter has HC-POA, but needs to speak with pt's employer and her bank.  [employer] aware pt having procedure tomorrow.

## 2014-08-04 NOTE — Progress Notes (Signed)
Social Work Patient ID: Carla Hopkins, female   DOB: 09/10/1950, 64 y.o.   MRN: [id number] Informed by MD pt going to surgery tomorrow and not returning to CIR.  Have contacted [employer] to inform of plan.  She reports if more than 24 hr on acute will close and need to reopen case, To be able to return to CIR if needs rehab. Pt and daughter aware of plan.

## 2014-08-04 NOTE — Progress Notes (Signed)
Speech Language Pathology Daily Session Note  Patient Details  Name: Carla Hopkins MRN: [id number] Date of Birth: 17-Feb-1951  Today's Date: 08/04/2014 SLP Individual Time: 0800-0900 SLP Individual Time Calculation (min): 60 min  Short Term Goals: Week 1: SLP Short Term Goal 1 (Week 1): Pt will attend to left body/environment during semi-complex functional tasks with min assist verbal cues SLP Short Term Goal 2 (Week 1): Pt will complete semi-complex tasks with min assist verbal cues for functional problem solving.   SLP Short Term Goal 3 (Week 1): Pt will return demonstration of oral motor strengthening exercises targeting left sided weakness with supervision.  SLP Short Term Goal 4 (Week 1): Pt will utilize slow rate and overarticulation to achieve intelligibility in conversations with supervision.  SLP Short Term Goal 5 (Week 1): Pt will consume regular textures and thin liquids with mod I self monitoring and correction of left sided buccal residue.    Skilled Therapeutic Interventions: Skilled treatment session focused on dysphagia and cognitive goals. SLP facilitated session by providing skilled observation with breakfast meal of regular textures with thin liquids. Patient consumed limited amounts of meal without overt s/s of aspiration and required verbal cue X 2 to self-monitor and correct left buccal pocketing. SLP also facilitated session with medication management task. Patient required Min A verbal and question cues for recall of her current medications and their functions and Max A multimodal cues for functional problem solving and attention to left field of environment while organizing a 2 time per day pill box.  Patient also self-monitored and corrected her rate of speech with Mod I for intelligibility of 100% at the sentence level. Patient left in recliner with all needs within reach. Continue with current plan of care.    FIM:  Comprehension Comprehension Mode:  Auditory Comprehension: 6-Follows complex conversation/direction: With extra time/assistive device Expression Expression Mode: Verbal Social Interaction Social Interaction: 6-Interacts appropriately with others with medication or extra time (anti-anxiety, antidepressant). Problem Solving Problem Solving: 4-Solves basic 75 - 89% of the time/requires cueing 10 - 24% of the time Memory Memory: 4-Recognizes or recalls 75 - 89% of the time/requires cueing 10 - 24% of the time FIM - Eating Eating Activity: 6: More than reasonable amount of time  Pain Pain Assessment Pain Assessment: No/denies pain  Therapy/Group: Individual Therapy  Carla Hopkins 08/04/2014, 3:55 PM

## 2014-08-04 NOTE — Progress Notes (Signed)
Subjective/Complaints: Per pt vascular surgery has scheduled surgery for tomorrow  Review of systems positive for left upper extremity weakness, no bowel or bladder difficulties, no breathing problems, otherwise negative  Objective: Vital Signs: Blood pressure 127/59, pulse 68, temperature 98.2 F (36.8 C), temperature source Oral, resp. rate 18, height 5' 2.5" (1.588 m), weight 94 kg (207 lb 3.7 oz), SpO2 97 %. No results found. Results for orders placed or performed during the hospital encounter of 07/30/14 (from the past 72 hour(s))  Glucose, capillary     Status: Abnormal   Collection Time: 08/01/14 12:11 PM  Result Value Ref Range   Glucose-Capillary 134 (H) 70 - 99 mg/dL  Glucose, capillary     Status: Abnormal   Collection Time: 08/01/14  4:35 PM  Result Value Ref Range   Glucose-Capillary 155 (H) 70 - 99 mg/dL  Glucose, capillary     Status: Abnormal   Collection Time: 08/01/14  9:41 PM  Result Value Ref Range   Glucose-Capillary 142 (H) 70 - 99 mg/dL   Comment 1 Notify RN    Comment 2 Document in Chart   Glucose, capillary     Status: Abnormal   Collection Time: 08/02/14  7:03 AM  Result Value Ref Range   Glucose-Capillary 183 (H) 70 - 99 mg/dL   Comment 1 Notify RN    Comment 2 Document in Chart   Glucose, capillary     Status: Abnormal   Collection Time: 08/02/14 12:01 PM  Result Value Ref Range   Glucose-Capillary 112 (H) 70 - 99 mg/dL  Glucose, capillary     Status: Abnormal   Collection Time: 08/02/14  4:31 PM  Result Value Ref Range   Glucose-Capillary 138 (H) 70 - 99 mg/dL   Comment 1 Notify RN   Glucose, capillary     Status: Abnormal   Collection Time: 08/02/14  8:47 PM  Result Value Ref Range   Glucose-Capillary 109 (H) 70 - 99 mg/dL  Glucose, capillary     Status: Abnormal   Collection Time: 08/03/14  6:58 AM  Result Value Ref Range   Glucose-Capillary 167 (H) 70 - 99 mg/dL  Glucose, capillary     Status: Abnormal   Collection Time: 08/03/14  11:26 AM  Result Value Ref Range   Glucose-Capillary 116 (H) 70 - 99 mg/dL   Comment 1 Notify RN   Glucose, capillary     Status: Abnormal   Collection Time: 08/03/14  4:30 PM  Result Value Ref Range   Glucose-Capillary 148 (H) 70 - 99 mg/dL   Comment 1 Notify RN   Glucose, capillary     Status: Abnormal   Collection Time: 08/03/14  9:02 PM  Result Value Ref Range   Glucose-Capillary 114 (H) 70 - 99 mg/dL  Glucose, capillary     Status: Abnormal   Collection Time: 08/04/14  6:48 AM  Result Value Ref Range   Glucose-Capillary 146 (H) 70 - 99 mg/dL     HEENT: normal Cardio: RRR and no murmurs Resp: CTA B/L and unlabored GI: BS positive and nontender nondistended Extremity:  Pulses positive and No Edema Skin:   Intact Neuro: Alert/Oriented, Cranial Nerve II-XII normal, Abnormal Sensory reduced left upper extremity,  does withdraw weakly to pinch, Abnormal Motor 2 minus biceps as well as shoulder retraction, 3- abduction, 0 at the wrist and trace fingers,  left lower extremity 4/5 and hip flexor and extensor ankle dorsi flexor and Other , there is evidence of neglect Musc/Skel:  Other no pain with left shoulder range of motion Gen. no acute distress   Assessment/Plan: 1. Functional deficits secondary to right MCA infarct causing left hemiparesis upper greater than lower which require 3+ hours per day of interdisciplinary therapy in a comprehensive inpatient rehab setting. Physiatrist is providing close team supervision and 24 hour management of active medical problems listed below. Physiatrist and rehab team continue to assess barriers to discharge/monitor patient progress toward functional and medical goals. Orders per [facility] for NPO p midnoc ? If plavix and metformin and heparin need to be held Still requiring physical assist for ADLs adn mobility Would need to D/C to [facility] for surgery and post op, probable readmit to CIR once stable post op FIM: FIM - Bathing Bathing Steps Patient  Completed: Chest, Right Arm, Left Arm, Abdomen, Buttocks, Right upper leg, Right lower leg (including foot), Left upper leg, Left lower leg (including foot), Front perineal area Bathing: 4: Steadying assist  FIM - Upper Body Dressing/Undressing Upper body dressing/undressing steps patient completed: Thread/unthread right sleeve of front closure shirt/dress Upper body dressing/undressing: 2: Max-Patient completed 25-49% of tasks FIM - Lower Body Dressing/Undressing Lower body dressing/undressing steps patient completed: Thread/unthread right pants leg, Thread/unthread right underwear leg Lower body dressing/undressing: 2: Max-Patient completed 25-49% of tasks  FIM - Toileting Toileting steps completed by patient: Performs perineal hygiene, Adjust clothing prior to toileting, Adjust clothing after toileting Toileting Assistive Devices: Grab bar or rail for support Toileting: 5: Supervision: Safety issues/verbal cues  FIM - Radio producer Devices: Grab bars Toilet Transfers: 4-From toilet/BSC: Min A (steadying Pt. > 75%), 4-To toilet/BSC: Min A (steadying Pt. > 75%)  FIM - Bed/Chair Transfer Bed/Chair Transfer: 4: Bed > Chair or W/C: Min A (steadying Pt. > 75%), 4: Chair or W/C > Bed: Min A (steadying Pt. > 75%)  FIM - Locomotion: Wheelchair Distance: 50 Locomotion: Wheelchair: 0: Activity did not occur FIM - Locomotion: Ambulation Locomotion: Ambulation Assistive Devices:  (no AD) Ambulation/Gait Assistance: 3: Mod assist Locomotion: Ambulation: 1: Travels less than 50 ft with moderate assistance (Pt: 50 - 74%)  Comprehension Comprehension Mode: Auditory Comprehension: 6-Follows complex conversation/direction: With extra time/assistive device  Expression Expression Mode: Verbal Expression: 6-Expresses complex ideas: With extra time/assistive device  Social Interaction Social Interaction: 6-Interacts appropriately with others with medication or extra  time (anti-anxiety, antidepressant).  Problem Solving Problem Solving: 4-Solves basic 75 - 89% of the time/requires cueing 10 - 24% of the time  Memory Memory: 4-Recognizes or recalls 75 - 89% of the time/requires cueing 10 - 24% of the time  Medical Problem List and Plan: 1. Functional deficits secondary to right MCA infarct causing left hemiparesis and Left hemisensory deficits 2.  DVT Prophylaxis/Anticoagulation: Subcutaneous heparin. Monitor platelet counts and any signs of bleeding 3. Pain Management: Tylenol as needed 4. Dysphagia. Dysphagia #2 nectar liquids. Monitor for any aspiration. Follow-up speech therapy 5. Neuropsych: This patient is capable of making decisions on her own behalf. 6. Skin/Wound Care: Routine skin checks 7. Fluids/Electrolytes/Nutrition: Strict I and O's with follow-up chemistries 8. Hypertension. Hydrochlorothiazide 12.5 mg daily, lisinopril 5 mg daily. Monitor with increased mobility 9. Right ICA stenosis. Follow-up vascular surgery Dr. Oneida Alar to address CEA in 2-3 weeks 10. 6 mm pulmonary nodule identified on CT angiogram of chest. Recommendations follow-up CT of the chest 6-12 months 11. New findings diabetes mellitus. Hemoglobin A1c 8.5. Glucophage 500 mg twice a day. Check blood sugars before meals and at bedtime. Provide diabetic teaching 12. Hypothyroidism. Synthroid.  TSH 2.151 13. Hyperlipidemia. Lipitor   LOS (Days) 5 A FACE TO FACE EVALUATION WAS PERFORMED  Deklan Minar E 08/04/2014, 8:30 AM

## 2014-08-04 NOTE — Progress Notes (Signed)
Physical Therapy Session Note  Patient Details  Name: Carla Hopkins MRN: [id number] Date of Birth: 11-30-1950  Today's Date: 08/04/2014 PT Individual Time: 1105-1205 PT Individual Time Calculation (min): 60 min  PT concurrent time: 1425-1505, 40 min  Short Term Goals: Week 1:  PT Short Term Goal 1 (Week 1): pt will transfer bed>< w/c with min assist consistently PT Short Term Goal 2 (Week 1): pt will lock L brake with L hand consistently after 1 cue PT Short Term Goal 3 (Week 1): pt will perform gait x 50' with assistance of 1 person PT Short Term Goal 4 (Week 1): pt will ascend/descend 4 steps 2 rails with mod assist  Skilled Therapeutic Interventions/Progress Updates:  tx1  focused on neuromuscular re-education during functional activities via demo, forced use, visual cues, VCs, manual cues. Unsupprted Sitting: trunk shortening/lengthening/rotating to L and R while reaching within BOS  Sit>< stand biased to L , blocked practice without use of UEs and without pressing LEs against mat Supported sitting: L hand gross grasp during Rx bottle lids removal, LUE forearm wt bearing and L visual attention Dynamic standing: trunk rotating with transitional RLe stepping, to match playing card onto board in front and to L Gait on steps: ascending and descending with step- to method x 1, min assist, with step- through method x 1, min assist, mod cues throughout for L attention for L foot placement, clearing L foot, L visual attention    Max assist needed x 1 during LOB L during dynamic standing activity, to prevent fall. L inattention impacts safety in standing.  Tx 2 concurrent therapy, focused on therapeutic activities and neuro re-ed via forced use, visual feedback, VCs, manual cues, demo.  W/c> NuStep x 3 minutes using bil LEs and RUE, x 4 minutes using bil LEs, focusing on L neutral hip rotation Supported sitting: R hand task stacking cups, requiring L rotation and visual attention to L; bil  knee extension to hold feet symmetricallly high, while PT pushed pt in w/c Gait without AD, x 60' mod assist.including turn to L, x 2 focusing on L foot clearance, slowing down, L visual attention keeping cones on left, and weaving in/out of cone mod/max assist  Pt requires mod/max assist for L attention during all functional activities. L Trendelenburg is marked with L SLS during gait.   PT returned pt to room; all needs left within reach.  Therapy Documentation Precautions:  Precautions Precautions: Fall Precaution Comments: severe L sided inattention Restrictions Weight Bearing Restrictions: No   Pain: none reported Pain Assessment Pain Assessment: No/denies pain    See FIM for current functional status  Therapy/Group: Individual Therapy and concurrent therapy  Caitlynn Ju,[place] 08/04/2014, 3:49 PM

## 2014-08-05 ENCOUNTER — Inpatient Hospital Stay (HOSPITAL_COMMUNITY)
Admission: RE | Admit: 2014-08-05 | Discharge: 2014-08-06 | DRG: 038 | Disposition: A | Discharge status: Rehabilitation Facility | Payer: BLUE CROSS/BLUE SHIELD | Source: Ambulatory Visit | Attending: Vascular Surgery | Admitting: Vascular Surgery

## 2014-08-05 ENCOUNTER — Encounter (HOSPITAL_COMMUNITY)
Admission: RE | Disposition: A | Discharge status: Rehabilitation Facility | Payer: BLUE CROSS/BLUE SHIELD | Source: Ambulatory Visit | Attending: Vascular Surgery

## 2014-08-05 ENCOUNTER — Inpatient Hospital Stay (HOSPITAL_COMMUNITY): Payer: BLUE CROSS/BLUE SHIELD

## 2014-08-05 ENCOUNTER — Inpatient Hospital Stay (HOSPITAL_COMMUNITY): Payer: BLUE CROSS/BLUE SHIELD | Admitting: Anesthesiology

## 2014-08-05 DIAGNOSIS — I63311 Cerebral infarction due to thrombosis of right middle cerebral artery: Secondary | ICD-10-CM | POA: Diagnosis not present

## 2014-08-05 DIAGNOSIS — F17210 Nicotine dependence, cigarettes, uncomplicated: Secondary | ICD-10-CM | POA: Diagnosis present

## 2014-08-05 DIAGNOSIS — G8194 Hemiplegia, unspecified affecting left nondominant side: Secondary | ICD-10-CM | POA: Diagnosis present

## 2014-08-05 DIAGNOSIS — I6521 Occlusion and stenosis of right carotid artery: Secondary | ICD-10-CM | POA: Diagnosis present

## 2014-08-05 DIAGNOSIS — R1310 Dysphagia, unspecified: Secondary | ICD-10-CM | POA: Diagnosis present

## 2014-08-05 DIAGNOSIS — S60222A Contusion of left hand, initial encounter: Secondary | ICD-10-CM | POA: Diagnosis not present

## 2014-08-05 DIAGNOSIS — E780 Pure hypercholesterolemia: Secondary | ICD-10-CM | POA: Diagnosis present

## 2014-08-05 DIAGNOSIS — Z7902 Long term (current) use of antithrombotics/antiplatelets: Secondary | ICD-10-CM | POA: Diagnosis not present

## 2014-08-05 DIAGNOSIS — R4781 Slurred speech: Secondary | ICD-10-CM | POA: Diagnosis present

## 2014-08-05 DIAGNOSIS — Z7982 Long term (current) use of aspirin: Secondary | ICD-10-CM

## 2014-08-05 DIAGNOSIS — I6529 Occlusion and stenosis of unspecified carotid artery: Secondary | ICD-10-CM | POA: Diagnosis present

## 2014-08-05 DIAGNOSIS — E039 Hypothyroidism, unspecified: Secondary | ICD-10-CM | POA: Diagnosis present

## 2014-08-05 DIAGNOSIS — T801XXA Vascular complications following infusion, transfusion and therapeutic injection, initial encounter: Secondary | ICD-10-CM | POA: Diagnosis not present

## 2014-08-05 DIAGNOSIS — T80818A Extravasation of other vesicant agent, initial encounter: Secondary | ICD-10-CM | POA: Diagnosis not present

## 2014-08-05 DIAGNOSIS — Z452 Encounter for adjustment and management of vascular access device: Secondary | ICD-10-CM

## 2014-08-05 DIAGNOSIS — R911 Solitary pulmonary nodule: Secondary | ICD-10-CM | POA: Diagnosis present

## 2014-08-05 DIAGNOSIS — Z8673 Personal history of transient ischemic attack (TIA), and cerebral infarction without residual deficits: Secondary | ICD-10-CM | POA: Diagnosis not present

## 2014-08-05 DIAGNOSIS — M6289 Other specified disorders of muscle: Secondary | ICD-10-CM | POA: Diagnosis not present

## 2014-08-05 DIAGNOSIS — I10 Essential (primary) hypertension: Secondary | ICD-10-CM | POA: Diagnosis present

## 2014-08-05 DIAGNOSIS — E119 Type 2 diabetes mellitus without complications: Secondary | ICD-10-CM | POA: Diagnosis present

## 2014-08-05 DIAGNOSIS — I63511 Cerebral infarction due to unspecified occlusion or stenosis of right middle cerebral artery: Secondary | ICD-10-CM | POA: Diagnosis present

## 2014-08-05 DIAGNOSIS — E1165 Type 2 diabetes mellitus with hyperglycemia: Secondary | ICD-10-CM | POA: Diagnosis not present

## 2014-08-05 HISTORY — PX: ENDARTERECTOMY: SHX5162

## 2014-08-05 HISTORY — PX: PATCH ANGIOPLASTY: SHX6230

## 2014-08-05 LAB — CBC
HCT: 43.9 % (ref 36.0–46.0)
Hemoglobin: 14.6 g/dL (ref 12.0–15.0)
MCH: 28.5 pg (ref 26.0–34.0)
MCHC: 33.3 g/dL (ref 30.0–36.0)
MCV: 85.6 fL (ref 78.0–100.0)
PLATELETS: 250 10*3/uL (ref 150–400)
RBC: 5.13 MIL/uL — ABNORMAL HIGH (ref 3.87–5.11)
RDW: 15.2 % (ref 11.5–15.5)
WBC: 13.9 10*3/uL — AB (ref 4.0–10.5)

## 2014-08-05 LAB — PROTIME-INR
INR: 0.98 (ref 0.00–1.49)
Prothrombin Time: 13.1 seconds (ref 11.6–15.2)

## 2014-08-05 LAB — BASIC METABOLIC PANEL
ANION GAP: 12 (ref 5–15)
BUN: 31 mg/dL — ABNORMAL HIGH (ref 6–20)
CO2: 22 mmol/L (ref 22–32)
CREATININE: 1.01 mg/dL — AB (ref 0.44–1.00)
Calcium: 9.3 mg/dL (ref 8.9–10.3)
Chloride: 100 mmol/L — ABNORMAL LOW (ref 101–111)
GFR calc non Af Amer: 58 mL/min — ABNORMAL LOW (ref >60)
Glucose, Bld: 154 mg/dL — ABNORMAL HIGH (ref 70–99)
Potassium: 4 mmol/L (ref 3.5–5.1)
Sodium: 134 mmol/L — ABNORMAL LOW (ref 135–145)

## 2014-08-05 LAB — GLUCOSE, CAPILLARY
GLUCOSE-CAPILLARY: 165 mg/dL — AB (ref 70–99)
GLUCOSE-CAPILLARY: 143 mg/dL — AB (ref 70–99)
Glucose-Capillary: 163 mg/dL — ABNORMAL HIGH (ref 70–99)
Glucose-Capillary: 165 mg/dL — ABNORMAL HIGH (ref 70–99)
Glucose-Capillary: 162 mg/dL — ABNORMAL HIGH (ref 70–99)

## 2014-08-05 LAB — TYPE AND SCREEN
ABO/RH(D): O POS
ANTIBODY SCREEN: NEGATIVE

## 2014-08-05 LAB — ABO/RH: ABO/RH(D): O POS

## 2014-08-05 SURGERY — ENDARTERECTOMY, CAROTID
Anesthesia: General | Site: Neck | Laterality: Right

## 2014-08-05 MED ORDER — CEFUROXIME SODIUM 1.5 G IJ SOLR
1.5000 g | Freq: Two times a day (BID) | INTRAMUSCULAR | Status: DC
  Administered 2014-08-06: 1.5 g via INTRAVENOUS
  Filled 2014-08-05 (×2): qty 1.5

## 2014-08-05 MED ORDER — LISINOPRIL 5 MG PO TABS
5.0000 mg | ORAL_TABLET | Freq: Every day | ORAL | Status: DC
  Filled 2014-08-05 (×2): qty 1

## 2014-08-05 MED ORDER — LIDOCAINE HCL (CARDIAC) 20 MG/ML IV SOLN
INTRAVENOUS | Status: AC
  Filled 2014-08-05: qty 5

## 2014-08-05 MED ORDER — 0.9 % SODIUM CHLORIDE (POUR BTL) OPTIME
TOPICAL | Status: DC | PRN
  Administered 2014-08-05 (×2): 1000 mL

## 2014-08-05 MED ORDER — THROMBIN 20000 UNITS EX SOLR
CUTANEOUS | Status: AC
  Filled 2014-08-05: qty 20000

## 2014-08-05 MED ORDER — GLYCOPYRROLATE 0.2 MG/ML IJ SOLN
INTRAMUSCULAR | Status: AC
  Filled 2014-08-05: qty 4

## 2014-08-05 MED ORDER — NEOSTIGMINE METHYLSULFATE 10 MG/10ML IV SOLN
INTRAVENOUS | Status: AC
  Filled 2014-08-05: qty 1

## 2014-08-05 MED ORDER — SODIUM CHLORIDE 0.9 % IV SOLN
0.0125 ug/kg/min | INTRAVENOUS | Status: AC
  Administered 2014-08-05: .1 ug/kg/min via INTRAVENOUS
  Filled 2014-08-05: qty 1000

## 2014-08-05 MED ORDER — NEOSTIGMINE METHYLSULFATE 10 MG/10ML IV SOLN
INTRAVENOUS | Status: DC | PRN
  Administered 2014-08-05: 3 mg via INTRAVENOUS

## 2014-08-05 MED ORDER — GLYCOPYRROLATE 0.2 MG/ML IJ SOLN
INTRAMUSCULAR | Status: AC
  Filled 2014-08-05: qty 2

## 2014-08-05 MED ORDER — ALUM & MAG HYDROXIDE-SIMETH 200-200-20 MG/5ML PO SUSP: 15.0000 mL | ORAL | Status: DC | PRN

## 2014-08-05 MED ORDER — BISACODYL 10 MG RE SUPP: 10.0000 mg | Freq: Every day | RECTAL | Status: DC | PRN

## 2014-08-05 MED ORDER — PANTOPRAZOLE SODIUM 40 MG PO TBEC
40.0000 mg | DELAYED_RELEASE_TABLET | Freq: Every day | ORAL | Status: DC
  Administered 2014-08-05 – 2014-08-06 (×2): 40 mg via ORAL
  Filled 2014-08-05 (×2): qty 1

## 2014-08-05 MED ORDER — ATORVASTATIN CALCIUM 80 MG PO TABS
80.0000 mg | ORAL_TABLET | Freq: Every day | ORAL | Status: DC
  Filled 2014-08-05: qty 1

## 2014-08-05 MED ORDER — PROTAMINE SULFATE 10 MG/ML IV SOLN
INTRAVENOUS | Status: DC | PRN
  Administered 2014-08-05: 10 mg via INTRAVENOUS
  Administered 2014-08-05: 20 mg via INTRAVENOUS
  Administered 2014-08-05: 10 mg via INTRAVENOUS
  Administered 2014-08-05 (×3): 20 mg via INTRAVENOUS

## 2014-08-05 MED ORDER — THROMBIN 20000 UNITS EX SOLR
CUTANEOUS | Status: DC | PRN
  Administered 2014-08-05: 18:00:00 via TOPICAL

## 2014-08-05 MED ORDER — HYDROMORPHONE HCL 1 MG/ML IJ SOLN
0.2500 mg | INTRAMUSCULAR | Status: DC | PRN
  Administered 2014-08-05 (×2): 0.5 mg via INTRAVENOUS

## 2014-08-05 MED ORDER — GLYCOPYRROLATE 0.2 MG/ML IJ SOLN
INTRAMUSCULAR | Status: DC | PRN
  Administered 2014-08-05 (×2): 0.2 mg via INTRAVENOUS
  Administered 2014-08-05: 0.4 mg via INTRAVENOUS

## 2014-08-05 MED ORDER — ACETAMINOPHEN 325 MG PO TABS: 325.0000 mg | ORAL_TABLET | ORAL | Status: DC | PRN

## 2014-08-05 MED ORDER — ONDANSETRON HCL 4 MG/2ML IJ SOLN
INTRAMUSCULAR | Status: AC
  Filled 2014-08-05: qty 2

## 2014-08-05 MED ORDER — ROCURONIUM BROMIDE 100 MG/10ML IV SOLN
INTRAVENOUS | Status: DC | PRN
  Administered 2014-08-05: 50 mg via INTRAVENOUS

## 2014-08-05 MED ORDER — PHENYLEPHRINE HCL 10 MG/ML IJ SOLN
10.0000 mg | INTRAVENOUS | Status: DC | PRN
  Administered 2014-08-05: 17:00:00 via INTRAVENOUS
  Administered 2014-08-05: 30 ug/min via INTRAVENOUS

## 2014-08-05 MED ORDER — METOPROLOL TARTRATE 1 MG/ML IV SOLN: 2.0000 mg | INTRAVENOUS | Status: DC | PRN

## 2014-08-05 MED ORDER — MAGNESIUM SULFATE 2 GM/50ML IV SOLN: 2.0000 g | Freq: Every day | INTRAVENOUS | Status: DC | PRN

## 2014-08-05 MED ORDER — SODIUM CHLORIDE 0.9 % IV SOLN
500.0000 mL | Freq: Once | INTRAVENOUS | Status: AC | PRN
  Administered 2014-08-05: 500 mL via INTRAVENOUS

## 2014-08-05 MED ORDER — DOCUSATE SODIUM 100 MG PO CAPS
100.0000 mg | ORAL_CAPSULE | Freq: Every day | ORAL | Status: DC
  Administered 2014-08-06: 100 mg via ORAL
  Filled 2014-08-05: qty 1

## 2014-08-05 MED ORDER — ENOXAPARIN SODIUM 30 MG/0.3ML ~~LOC~~ SOLN
30.0000 mg | SUBCUTANEOUS | Status: DC
  Filled 2014-08-05 (×2): qty 0.3

## 2014-08-05 MED ORDER — PHENOL 1.4 % MT LIQD: 1.0000 | OROMUCOSAL | Status: DC | PRN

## 2014-08-05 MED ORDER — MORPHINE SULFATE 2 MG/ML IJ SOLN
2.0000 mg | INTRAMUSCULAR | Status: DC | PRN
  Administered 2014-08-05: 2 mg via INTRAVENOUS
  Filled 2014-08-05: qty 1

## 2014-08-05 MED ORDER — HEPARIN SODIUM (PORCINE) 1000 UNIT/ML IJ SOLN
INTRAMUSCULAR | Status: DC | PRN
  Administered 2014-08-05: 10000 [IU] via INTRAVENOUS

## 2014-08-05 MED ORDER — FENTANYL CITRATE (PF) 100 MCG/2ML IJ SOLN
INTRAMUSCULAR | Status: DC | PRN
  Administered 2014-08-05 (×2): 50 ug via INTRAVENOUS

## 2014-08-05 MED ORDER — ASPIRIN 81 MG PO CHEW
81.0000 mg | CHEWABLE_TABLET | Freq: Every day | ORAL | Status: DC
  Administered 2014-08-05 – 2014-08-06 (×2): 81 mg via ORAL
  Filled 2014-08-05 (×2): qty 1

## 2014-08-05 MED ORDER — PROTAMINE SULFATE 10 MG/ML IV SOLN
INTRAVENOUS | Status: AC
  Filled 2014-08-05: qty 20

## 2014-08-05 MED ORDER — POTASSIUM CHLORIDE CRYS ER 20 MEQ PO TBCR: 20.0000 meq | EXTENDED_RELEASE_TABLET | Freq: Every day | ORAL | Status: DC | PRN

## 2014-08-05 MED ORDER — LEVOTHYROXINE SODIUM 88 MCG PO TABS
88.0000 ug | ORAL_TABLET | Freq: Every day | ORAL | Status: DC
  Administered 2014-08-06: 88 ug via ORAL
  Filled 2014-08-05 (×2): qty 1

## 2014-08-05 MED ORDER — PROPOFOL 10 MG/ML IV BOLUS
INTRAVENOUS | Status: AC
  Filled 2014-08-05: qty 20

## 2014-08-05 MED ORDER — LIDOCAINE HCL (CARDIAC) 20 MG/ML IV SOLN
INTRAVENOUS | Status: DC | PRN
  Administered 2014-08-05: 60 mg via INTRAVENOUS

## 2014-08-05 MED ORDER — LABETALOL HCL 5 MG/ML IV SOLN: 10.0000 mg | INTRAVENOUS | Status: DC | PRN

## 2014-08-05 MED ORDER — SODIUM CHLORIDE 0.9 % IR SOLN
Status: DC | PRN
  Administered 2014-08-05: 500 mL

## 2014-08-05 MED ORDER — PHENYLEPHRINE HCL 10 MG/ML IJ SOLN
INTRAMUSCULAR | Status: AC
  Filled 2014-08-05: qty 2

## 2014-08-05 MED ORDER — IBUPROFEN 200 MG PO TABS: 400.0000 mg | ORAL_TABLET | Freq: Four times a day (QID) | ORAL | Status: DC | PRN

## 2014-08-05 MED ORDER — ONDANSETRON HCL 4 MG/2ML IJ SOLN
INTRAMUSCULAR | Status: DC | PRN
  Administered 2014-08-05: 4 mg via INTRAVENOUS

## 2014-08-05 MED ORDER — METFORMIN HCL 500 MG PO TABS
500.0000 mg | ORAL_TABLET | Freq: Two times a day (BID) | ORAL | Status: DC
  Administered 2014-08-06: 500 mg via ORAL
  Filled 2014-08-05 (×3): qty 1

## 2014-08-05 MED ORDER — SODIUM CHLORIDE 0.9 % IV SOLN
INTRAVENOUS | Status: DC
  Administered 2014-08-05: 21:00:00 via INTRAVENOUS

## 2014-08-05 MED ORDER — HEPARIN SODIUM (PORCINE) 1000 UNIT/ML IJ SOLN
INTRAMUSCULAR | Status: AC
  Filled 2014-08-05: qty 2

## 2014-08-05 MED ORDER — GUAIFENESIN-DM 100-10 MG/5ML PO SYRP
15.0000 mL | ORAL_SOLUTION | ORAL | Status: DC | PRN
Start: 2014-08-05 — End: 2014-08-06

## 2014-08-05 MED ORDER — PROPOFOL 10 MG/ML IV BOLUS
INTRAVENOUS | Status: DC | PRN
  Administered 2014-08-05: 200 mg via INTRAVENOUS

## 2014-08-05 MED ORDER — DEXTROSE 5 % IV SOLN
1.5000 g | INTRAVENOUS | Status: AC
  Administered 2014-08-05: 1.5 g via INTRAVENOUS
  Filled 2014-08-05: qty 1.5

## 2014-08-05 MED ORDER — FENTANYL CITRATE (PF) 250 MCG/5ML IJ SOLN
INTRAMUSCULAR | Status: AC
  Filled 2014-08-05: qty 5

## 2014-08-05 MED ORDER — SENNOSIDES-DOCUSATE SODIUM 8.6-50 MG PO TABS
1.0000 | ORAL_TABLET | Freq: Every evening | ORAL | Status: DC | PRN
  Filled 2014-08-05: qty 1

## 2014-08-05 MED ORDER — ROCURONIUM BROMIDE 50 MG/5ML IV SOLN
INTRAVENOUS | Status: AC
  Filled 2014-08-05: qty 1

## 2014-08-05 MED ORDER — ONDANSETRON HCL 4 MG/2ML IJ SOLN
4.0000 mg | Freq: Four times a day (QID) | INTRAMUSCULAR | Status: DC | PRN
  Administered 2014-08-05: 4 mg via INTRAVENOUS

## 2014-08-05 MED ORDER — ACETAMINOPHEN 650 MG RE SUPP: 325.0000 mg | RECTAL | Status: DC | PRN

## 2014-08-05 MED ORDER — SUCCINYLCHOLINE CHLORIDE 20 MG/ML IJ SOLN
INTRAMUSCULAR | Status: AC
  Filled 2014-08-05: qty 1

## 2014-08-05 MED ORDER — OXYCODONE-ACETAMINOPHEN 5-325 MG PO TABS
1.0000 | ORAL_TABLET | ORAL | Status: DC | PRN
  Administered 2014-08-06: 1 via ORAL
  Filled 2014-08-05: qty 1

## 2014-08-05 MED ORDER — CALCIUM CHLORIDE 10 % IV SOLN
INTRAVENOUS | Status: AC
  Filled 2014-08-05: qty 10

## 2014-08-05 MED ORDER — HYDRALAZINE HCL 20 MG/ML IJ SOLN: 5.0000 mg | INTRAMUSCULAR | Status: DC | PRN

## 2014-08-05 MED ORDER — VITAMIN D (ERGOCALCIFEROL) 1.25 MG (50000 UNIT) PO CAPS
50000.0000 [IU] | ORAL_CAPSULE | ORAL | Status: DC
  Administered 2014-08-06: 50000 [IU] via ORAL
  Filled 2014-08-05: qty 1

## 2014-08-05 MED ORDER — PROMETHAZINE HCL 25 MG/ML IJ SOLN: 6.2500 mg | INTRAMUSCULAR | Status: DC | PRN

## 2014-08-05 MED ORDER — LACTATED RINGERS IV SOLN
INTRAVENOUS | Status: DC
  Administered 2014-08-05 (×4): via INTRAVENOUS

## 2014-08-05 MED ORDER — HYDROMORPHONE HCL 1 MG/ML IJ SOLN
INTRAMUSCULAR | Status: AC
  Filled 2014-08-05: qty 1

## 2014-08-05 MED ORDER — CALCIUM CHLORIDE 10 % IV SOLN
INTRAVENOUS | Status: DC | PRN
  Administered 2014-08-05 (×5): 100 mg via INTRAVENOUS

## 2014-08-05 MED ORDER — INSULIN ASPART 100 UNIT/ML ~~LOC~~ SOLN
0.0000 [IU] | Freq: Three times a day (TID) | SUBCUTANEOUS | Status: DC
  Administered 2014-08-06: 1 [IU] via SUBCUTANEOUS

## 2014-08-05 MED ORDER — MEPERIDINE HCL 25 MG/ML IJ SOLN: 6.2500 mg | INTRAMUSCULAR | Status: DC | PRN

## 2014-08-05 MED ORDER — LIDOCAINE HCL (PF) 1 % IJ SOLN
INTRAMUSCULAR | Status: AC
  Filled 2014-08-05: qty 30

## 2014-08-05 SURGICAL SUPPLY — 49 items
CANISTER SUCTION 2500CC (MISCELLANEOUS) ×2 IMPLANT
CANNULA VESSEL 3MM 2 BLNT TIP (CANNULA) ×2 IMPLANT
CATH ROBINSON RED A/P 18FR (CATHETERS) ×2 IMPLANT
CLIP TI MEDIUM 6 (CLIP) ×2 IMPLANT
CLIP TI WIDE RED SMALL 6 (CLIP) ×2 IMPLANT
CRADLE DONUT ADULT HEAD (MISCELLANEOUS) ×2 IMPLANT
DECANTER SPIKE VIAL GLASS SM (MISCELLANEOUS) IMPLANT
DRAIN HEMOVAC 1/8 X 5 (WOUND CARE) IMPLANT
ELECT REM PT RETURN 9FT ADLT (ELECTROSURGICAL) ×2
ELECTRODE REM PT RTRN 9FT ADLT (ELECTROSURGICAL) ×1 IMPLANT
EVACUATOR SILICONE 100CC (DRAIN) IMPLANT
GAUZE SPONGE 4X4 12PLY STRL (GAUZE/BANDAGES/DRESSINGS) ×2 IMPLANT
GEL ULTRASOUND 20GR AQUASONIC (MISCELLANEOUS) IMPLANT
GLOVE BIO SURGEON STRL SZ7.5 (GLOVE) ×4 IMPLANT
GLOVE BIOGEL PI IND STRL 6.5 (GLOVE) ×3 IMPLANT
GLOVE BIOGEL PI IND STRL 7.5 (GLOVE) ×1 IMPLANT
GLOVE BIOGEL PI IND STRL 8 (GLOVE) ×1 IMPLANT
GLOVE BIOGEL PI INDICATOR 6.5 (GLOVE) ×3
GLOVE BIOGEL PI INDICATOR 7.5 (GLOVE) ×1
GLOVE BIOGEL PI INDICATOR 8 (GLOVE) ×1
GLOVE ECLIPSE 6.5 STRL STRAW (GLOVE) ×2 IMPLANT
GLOVE ECLIPSE 7.0 STRL STRAW (GLOVE) ×2 IMPLANT
GLOVE SKINSENSE NS SZ7.0 (GLOVE) ×1
GLOVE SKINSENSE STRL SZ7.0 (GLOVE) ×1 IMPLANT
GOWN STRL REUS W/ TWL LRG LVL3 (GOWN DISPOSABLE) ×5 IMPLANT
GOWN STRL REUS W/TWL LRG LVL3 (GOWN DISPOSABLE) ×5
KIT BASIN OR (CUSTOM PROCEDURE TRAY) ×2 IMPLANT
KIT ROOM TURNOVER OR (KITS) ×2 IMPLANT
LIQUID BAND (GAUZE/BANDAGES/DRESSINGS) ×2 IMPLANT
LOOP VESSEL MINI RED (MISCELLANEOUS) IMPLANT
NEEDLE HYPO 25GX1X1/2 BEV (NEEDLE) IMPLANT
NS IRRIG 1000ML POUR BTL (IV SOLUTION) ×4 IMPLANT
PACK CAROTID (CUSTOM PROCEDURE TRAY) ×2 IMPLANT
PAD ARMBOARD 7.5X6 YLW CONV (MISCELLANEOUS) ×4 IMPLANT
PATCH HEMASHIELD 8X75 (Vascular Products) ×2 IMPLANT
SHUNT CAROTID BYPASS 10 (VASCULAR PRODUCTS) ×2 IMPLANT
SHUNT CAROTID BYPASS 12FRX15.5 (VASCULAR PRODUCTS) IMPLANT
SPONGE INTESTINAL PEANUT (DISPOSABLE) ×2 IMPLANT
SPONGE SURGIFOAM ABS GEL 100 (HEMOSTASIS) IMPLANT
SUT ETHILON 3 0 PS 1 (SUTURE) IMPLANT
SUT PROLENE 6 0 CC (SUTURE) ×2 IMPLANT
SUT PROLENE 7 0 BV 1 (SUTURE) IMPLANT
SUT SILK 3 0 TIES 17X18 (SUTURE)
SUT SILK 3-0 18XBRD TIE BLK (SUTURE) IMPLANT
SUT VIC AB 3-0 SH 27 (SUTURE) ×1
SUT VIC AB 3-0 SH 27X BRD (SUTURE) ×1 IMPLANT
SUT VICRYL 4-0 PS2 18IN ABS (SUTURE) ×2 IMPLANT
SYR CONTROL 10ML LL (SYRINGE) IMPLANT
WATER STERILE IRR 1000ML POUR (IV SOLUTION) ×2 IMPLANT

## 2014-08-05 NOTE — Anesthesia Procedure Notes (Signed)
Procedure Name: Intubation Date/Time: 08/05/2014 2:58 PM Performed by: Clearnce Sorrel Pre-anesthesia Checklist: Patient identified, Emergency Drugs available, Suction available and Patient being monitored Patient Re-evaluated:Patient Re-evaluated prior to inductionOxygen Delivery Method: Circle system utilized Preoxygenation: Pre-oxygenation with 100% oxygen Intubation Type: IV induction Ventilation: Mask ventilation without difficulty Laryngoscope Size: Mac and 3 Grade View: Grade II Tube type: Oral Tube size: 7.5 mm Number of attempts: 1 Airway Equipment and Method: Stylet Placement Confirmation: ETT inserted through vocal cords under direct vision,  positive ETCO2 and breath sounds checked- equal and bilateral Secured at: 22 cm Tube secured with: Tape Dental Injury: Teeth and Oropharynx as per pre-operative assessment

## 2014-08-05 NOTE — Interval H&P Note (Signed)
History and Physical Interval Note:  08/05/2014 7:26 AM  Carla Hopkins  has presented today for surgery, with the diagnosis of Right carotid artery stenosis I65.21  The various methods of treatment have been discussed with the patient and family. After consideration of risks, benefits and other options for treatment, the patient has consented to  Procedure(s): ENDARTERECTOMY CAROTID (Right) as a surgical intervention .  The patient's history has been reviewed, patient examined, no change in status, stable for surgery.  I have reviewed the patient's chart and labs.  Questions were answered to the patient's satisfaction.    Pt has had some recovery of her speech although still has to pause to gather her words.  She is able to now lift her left arm and left leg against gravity but not much more than that.  She also still has left facial droop.  Plan is to do right CEA today for prevention of recurrent stroke.  CVA was approximately 2 weeks ago.  Pt is on Plavix.  Risks benefits procedure details discussed with pt and daughter.  Stroke risk 2-5 % cranial nerve injury 10-15% small risk of bleeding, infection, myocardial events.  She wishes to proceed.   Ruta Hinds

## 2014-08-05 NOTE — Anesthesia Preprocedure Evaluation (Addendum)
Anesthesia Evaluation  Patient identified by MRN, date of birth, ID band Patient awake    Reviewed: Allergy & Precautions, NPO status , Patient's Chart, lab work & pertinent test results  Airway Mallampati: II       Dental  (+) Teeth Intact   Pulmonary neg pulmonary ROS, sleep apnea , Current Smoker,  breath sounds clear to auscultation        Cardiovascular hypertension, + Peripheral Vascular Disease negative cardio ROS  Rhythm:Regular Rate:Normal     Neuro/Psych CVA negative psych ROS   GI/Hepatic negative GI ROS, Neg liver ROS,   Endo/Other  diabetes, Type 2Hypothyroidism   Renal/GU negative Renal ROS     Musculoskeletal negative musculoskeletal ROS (+)   Abdominal   Peds  Hematology negative hematology ROS (+)   Anesthesia Other Findings   Reproductive/Obstetrics negative OB ROS                            Anesthesia Physical Anesthesia Plan  ASA: III  Anesthesia Plan: General   Post-op Pain Management:    Induction: Intravenous  Airway Management Planned: Oral ETT  Additional Equipment: Arterial line  Intra-op Plan:   Post-operative Plan: Extubation in OR  Informed Consent: I have reviewed the patients History and Physical, chart, labs and discussed the procedure including the risks, benefits and alternatives for the proposed anesthesia with the patient or authorized representative who has indicated his/her understanding and acceptance.   Dental advisory given  Plan Discussed with: CRNA  Anesthesia Plan Comments: (Remi gtt)        Anesthesia Quick Evaluation

## 2014-08-05 NOTE — Discharge Summary (Signed)
Carla Hopkins, Carla Hopkins              ACCOUNT NO.:  [id number]  MEDICAL RECORD NO.:  [id number]  LOCATION:  [id number]                        FACILITY:  [facility]  PHYSICIAN:  Charlett Blake, M.D.DATE OF BIRTH:  [birth year]  DATE OF ADMISSION:  07/30/2014 DATE OF DISCHARGE:  08/05/2014                              DISCHARGE SUMMARY   DISCHARGE DIAGNOSES: 1. Functional deficits secondary to right middle cerebral artery     infarct. 2. Subcutaneous heparin for deep venous thrombosis prophylaxis. 3. Dysphagia. 4. Hypertension. 5. Right internal carotid artery stenosis, 6-mm nodule identified on     CT angio of the chest.  NEW FINDINGS:  Diabetes mellitus, hypothyroidism, hyperlipidemia.  HISTORY OF PRESENT ILLNESS:  This is a 65 year old right-handed female with history of tobacco abuse, hypertension, who lives alone independent prior to admission, working full time.  Admitted on July 24, 2014, after a fall with left-sided weakness, no head trauma.  MRI of the brain shows multifocal patchy acute right MCA territory infarcts.  Additional cortical infarcts within parasagittal cortical gray matter.  CT angio of the chest, negative except for incidental finding with a 6-mm pulmonary nodule on the right with advisement of CT of the chest in 6-12 months. Elevated troponin at 4.17.  Echocardiogram; ejection fraction of 88%, grade 1 diastolic dysfunction.  Carotid Dopplers with left 50-60 and right 80% ICA stenosis.  Vascular Surgery followup for plan carotid surgery.  Follow up Cardiology Services for troponin, felt no further workup indicated.  Neurology followup, maintained on aspirin and Plavix therapy x3 months, then Plavix alone.  Subcutaneous heparin for DVT prophylaxis.  Findings of elevated hemoglobin A1c 8.5, placed on Glucophage with sliding scale insulin.  Dysphagia 2, nectar thick liquid diet.  The patient was admitted for comprehensive rehab program.  PAST MEDICAL HISTORY:  See  discharge diagnoses.  SOCIAL HISTORY:  Lives alone, independent prior to admission.  FUNCTIONAL STATUS:  Upon admission to [place], moderate assist to ambulate 100 feet, handheld assistance, minimal assist and pivot transfers; min-to-mod assist, activities of daily living.  PHYSICAL EXAMINATION:  VITAL SIGNS:  Blood pressure 101/65, pulse 79, temperature 98, respirations 20. GENERAL:  This was an alert female, follows commands, mild left inattention, left facial droop.  Speech was dysarthric, but intelligible. LUNGS:  Clear to auscultation. CARDIAC:  Regular rate and rhythm. ABDOMEN:  Soft, nontender.  Good bowel sounds.  REHABILITATION HOSPITAL COURSE:  The patient was admitted to Inpatient Rehab Services with therapies initiated on a 3-hour daily basis consisting of physical therapy, occupational therapy, speech therapy, and rehabilitation nursing.  The following issues were addressed during the patient's rehabilitation stay.  Pertaining to Ms. Klaas's right MCA infarct, remained on aspirin and Plavix therapy.  She would follow up Neurology Services.  Subcutaneous heparin for DVT prophylaxis.  No bleeding episodes.  Currently, on a dysphagia 2 nectar thick liquid diet.  No signs of aspiration.  Blood pressure is well controlled.  She received follow up Vascular Surgery for right ICA stenosis, plan for carotid surgery per Dr. Oneida Alar.  Incidental findings; 6-mm pulmonary nodule, identified on CT angio of the chest.  Advised CT of the chest in 6-12 months.  New findings; diabetes mellitus, hemoglobin  A1c.  She remained on Glucophage.  The patient received weekly collaborative interdisciplinary team conferences to discuss estimated length of stay, family teaching, and any barriers to discharge.  Noted some left-sided attention, demonstrates improvement, initiating gait without assistive device, perform transfer trainings with therapies, engage an upper extremity activity using  emphasis on left elbow extension.  Ambulated a 100 feet, small base quad cane, moderate assist; up and down 5 steps, moderate assist.  Plan was discharged to acute care services on Aug 05, 2014, for ICA carotid surgery.  DISCHARGE MEDICATIONS:  As per Vascular Surgery.  SPECIAL INSTRUCTIONS:  Follow up CT angio of the chest in 6-12 months for incidental finding of pulmonary nodule.     Lauraine Rinne, P.A.   ______________________________ Charlett Blake, M.D.    DA/MEDQ  D:  08/04/2014  T:  08/05/2014  Job:  [id number]

## 2014-08-05 NOTE — Transfer of Care (Signed)
Immediate Anesthesia Transfer of Care Note  Patient: Carla Hopkins  Procedure(s) Performed: Procedure(s): Right Carotid ENDARTERECTOMY  (Right) PATCH ANGIOPLASTY (Right)  Patient Location: PACU  Anesthesia Type:General  Level of Consciousness: awake, alert , oriented and patient cooperative  Airway & Oxygen Therapy: Patient Spontanous Breathing and Patient connected to nasal cannula oxygen  Post-op Assessment: Report given to RN, Post -op Vital signs reviewed and stable and Patient moving all extremities  Post vital signs: Reviewed and stable  Last Vitals: There were no vitals filed for this visit.  Complications: No apparent anesthesia complications

## 2014-08-05 NOTE — Op Note (Signed)
Procedure Right carotid endarterectomy  Preoperative diagnosis: High-grade symptomatic right internal carotid artery stenosis  Postoperative diagnosis: Same  Anesthesia General  Asst.: Gae Gallop MD, Gerri Lins, [facility]  Operative findings: #1 greater than 80% right internal carotid stenosis, small internal                                                              #2 Dacron patch           #3 10 Fr shunt  Operative details: After obtaining informed consent, the patient was taken to the operating room. The patient was placed in a supine position on the operating room table. After induction of general anesthesia and endotracheal intubation a Foley catheter was placed. Next the patient's entire neck and chest was prepped and draped in the usual sterile fashion. An oblique incision was made on the right aspect of the patient's neck anterior to the border the right sternocleidomastoid muscle. The incision was carried into the subcutaneous tissues and through the platysma. The sternocleidomastoid muscle was identified and reflected laterally. The omohyoid muscle was identified and this was divided with cautery. The common carotid artery was then found at the base of the incision this was dissected free circumferentially. It was fairly soft on palpation.  The vagus nerve was identified and protected. Dissection was then carried up to the level carotid bifurcation.   The hyperglossal nerve was draped over the diseased portion of the internal carotid so this was mobilized by first dividing the ansa cervicalis and a tethering arterial and venous branch. The internal carotid artery was dissected free circumferentially just below the level of the hypoglossal nerve and it was soft in character at this location and above any palpable disease. A vessel loop was placed around this. The vessel was fairly small. Next the external carotid and superior thyroid arteries were dissected free circumferentially and  vessel loops were placed around these. The patient was given 10000 units of intravenous heparin.  After 2 minutes of circulation time and raising the mean arterial pressure to 90 mm mercury, the distal internal carotid artery was controlled with small bulldog clamp. The external carotid and superior thyroid arteries were controlled with vessel loops. The common carotid artery was controlled with a peripheral DeBakey clamp. A longitudinal opening was made in the common carotid artery just below the bifurcation. The arteriotomy was extended distally up into the internal carotid with Potts scissors. There was a large calcified plaque with greater than 80% stenosis in the internal carotid and a tail of plaque extending several centimeters into the internal carotid.  This was a small vessel. But a 10 Fr shunt was brought onto the field and fashioned to fit the patient's artery.  This was threaded into the distal internal carotid artery and allowed to backbleed thoroughly. It was snug but did fit easily. There was pulsatile backbleeding.  This was then threaded into the common carotid and secured with a Rummel tourniquet.   There was no air at this point and flow was restored to the brain.  Attention was then turned to the common carotid artery once again. A suitable endarterectomy plane was obtained and endarterectomy was begun in the common carotid artery and a good proximal endpoint was obtained. An eversion endarterectomy was performed on the  external carotid artery and a good endpoint was obtained. The plaque was then elevated in the internal carotid artery and a nice feathered distal endpoint was also obtained.  The plaque was passed off the table. All loose debris was then removed from the carotid bed and everything was thoroughly irrigated with heparinized saline. A Dacron patch was then brought on to the operative field and this was sewn on as a patch angioplasty using a running 6-0 Prolene suture. Prior to  completion of the anastomosis, the shunt was reclamped and brought down out of the distal internal carotid artery.   The internal carotid artery was thoroughly backbled. This was then controlled again with a fine bulldog clamp.  The common carotid was thoroughly flushed forward. The external carotid was also thoroughly backbled.  The remainder of the patch was completed and the anastomosis was secured. Flow was then restored first retrograde from the external carotid into the carotid bed then antegrade from the common carotid to the external carotid artery and after approximately 5 cardiac cycles to the internal carotid artery. Doppler was used to evaluate the external/internal and common carotid arteries and these all had good Doppler flow. The patient was also given 100 mg of Protamine.      The platysma muscle was reapproximated using a running 3-0 Vicryl suture. The skin was closed with 4 0 Vicryl subcuticular stitch.  The patient was awakened in the operating room and was at her baseline neuro function with minimal left arm movement, left leg weakness, left facial droop and following commands.  The patient was stable on arrival to the PACU.  Ruta Hinds, MD [facility] [place] Office: [phone] Pager: [phone]

## 2014-08-05 NOTE — Progress Notes (Signed)
Social Work Discharge Note Discharge Note  The overall goal for the admission was met for:   Discharge location: No-ACUTE FOR SURGERY  Length of Stay: No-6 DAYS  Discharge activity level: No-MIN/MOD LEVEL  Home/community participation: Yes  Services provided included: MD, RD, PT, OT, SLP, RN, CM, TR, Pharmacy and SW  Financial Services: Private Insurance: [place]  Follow-up services arranged: Other: TRANSFERRING TO ACUTE FOR SURGERY  Comments (or additional information):GOING FOR CEA TODAY HOPEFULLY WILL RETURN TO REHAB TO FINISH WHEN MEDICALLY STABLE  Patient/Family verbalized understanding of follow-up arrangements: Yes  Individual responsible for coordination of the follow-up plan: PT & DAUGHTER  Confirmed correct DME delivered: Elease Hashimoto 08/05/2014    Elease Hashimoto

## 2014-08-05 NOTE — Anesthesia Postprocedure Evaluation (Signed)
  Anesthesia Post-op Note  Patient: Carla Hopkins  Procedure(s) Performed: Procedure(s): Right Carotid ENDARTERECTOMY  (Right) PATCH ANGIOPLASTY (Right)  Patient Location: PACU  Anesthesia Type:General  Level of Consciousness: awake, alert  and oriented   Airway and Oxygen Therapy: Patient Spontanous Breathing and Patient connected to nasal cannula oxygen  Post-op Pain: mild  Post-op Assessment: Post-op Vital signs reviewed, Patient's Cardiovascular Status Stable, Respiratory Function Stable, Patent Airway and Pain level controlled  Post-op Vital Signs: stable  Last Vitals:  Filed Vitals:   08/05/14 2015  BP:   Pulse: 68  Temp:   Resp: 9    Complications: No apparent anesthesia complications

## 2014-08-05 NOTE — H&P (View-Only) (Signed)
[facility] [place] HISTORY AND PHYSICAL  Requesting: Dr Jerline Pain [facility] Reason for consult: acute stroke with right carotid stenosis History of Present Illness:  Carla Hopkins is a 64 y.o. female who presents for evaluation of symptomatic right internal carotid stenosis with stroke.  Pt admitted yesterday with acute onset slurred speech, left arm profound and left leg weakness.  CT showed greater than 80% stenosis right ICA, minimal left carotid stenosis.  No prior stroke or TIA or amaurosis.  She has had no improvement in speech or left arm at this point.  Other medical problems include hypertension, borderline diabetes, ? Elevated cholesterol, tobacco abuse.  She is currently on ASA, lipitor, sliding scale insulin.  She is supposed to be starting Plavix as well.  Past Medical History  Diagnosis Date  . Hypothyroidism   . Hypertension     Past Surgical History  Procedure Laterality Date  . Tubal ligation      Social History History  Substance Use Topics  . Smoking status: Current Some Day Smoker -- 20.00 packs/day    Types: Cigarettes  . Smokeless tobacco: Not on file  . Alcohol Use: Yes     Comment: ocassionally - wine    Family History Family History  Problem Relation Age of Onset  . Adopted: Yes    Allergies  No Known Allergies   Current Facility-Administered Medications  Medication Dose Route Frequency Provider Last Rate Last Dose  . antiseptic oral rinse (CPC / CETYLPYRIDINIUM CHLORIDE 0.05%) solution 7 mL  7 mL Mouth Rinse BID Kinnie Feil, MD   7 mL at 07/25/14 1235  . aspirin tablet 325 mg  325 mg Oral Daily Vivi Barrack, MD   325 mg at 07/25/14 1234   Or  . aspirin suppository 300 mg  300 mg Rectal Daily Vivi Barrack, MD      . atorvastatin (LIPITOR) tablet 40 mg  40 mg Oral q1800 Alyssa A Haney, MD      . heparin injection 5,000 Units  5,000 Units Subcutaneous 3 times per day Vivi Barrack, MD   5,000 Units at 07/25/14 1303  .  insulin aspart (novoLOG) injection 0-5 Units  0-5 Units Subcutaneous QHS Vivi Barrack, MD      . insulin aspart (novoLOG) injection 0-9 Units  0-9 Units Subcutaneous TID WC Vivi Barrack, MD   2 Units at 07/25/14 1248  . levothyroxine (SYNTHROID, LEVOTHROID) tablet 88 mcg  88 mcg Oral QAC breakfast Leone Haven, MD   88 mcg at 07/25/14 1235  . senna-docusate (Senokot-S) tablet 1 tablet  1 tablet Oral QHS PRN Leone Haven, MD        ROS:   General:  No weight loss, Fever, chills  HEENT: + recent headaches, no nasal bleeding, no visual changes, no sore throat  Neurologic: + dizziness, blackouts, seizures. No recent symptoms of stroke or mini- stroke. No recent episodes of slurred speech, or temporary blindness.  Cardiac: No recent episodes of chest pain/pressure, no shortness of breath at rest.  + shortness of breath with exertion.  Denies history of atrial fibrillation or irregular heartbeat  Vascular: No history of rest pain in feet.  No history of claudication.  No history of non-healing ulcer, No history of DVT   Pulmonary: No home oxygen, no productive cough, no hemoptysis,  No asthma or wheezing  Musculoskeletal:  [ ]  Arthritis, [ ]  Low back pain,  [ ]  Joint pain  Hematologic:No history of  hypercoagulable state.  No history of easy bleeding.  No history of anemia  Gastrointestinal: No hematochezia or melena,  No gastroesophageal reflux, no trouble swallowing  Urinary: [ ]  chronic Kidney disease, [ ]  on HD - [ ]  MWF or [ ]  TTHS, [ ]  Burning with urination, [ ]  Frequent urination, [ ]  Difficulty urinating;   Skin: No rashes  Psychological: No history of anxiety,  No history of depression   Physical Examination  Filed Vitals:   07/25/14 0312 07/25/14 0334 07/25/14 0739 07/25/14 1244  BP: 117/69 117/69 104/67 111/58  Pulse: 89 82 95   Temp: 98 F (36.7 C)  98.7 F (37.1 C) 98.3 F (36.8 C)  TempSrc: Oral  Oral Oral  Resp: 24 18 21 21   Height:      Weight:       SpO2: 95% 95% 95% 94%    Body mass index is 37.83 kg/(m^2).  General:  Alert and oriented, no acute distress HEENT: Normal Neck: No bruit or JVD Pulmonary: Clear to auscultation bilaterally Cardiac: Regular Rate and Rhythm without murmur Abdomen: Soft, non-tender, non-distended, no mass, obese Skin: diffuse macular rash bilateral lower extremities Extremity Pulses:  2+ radial, brachial, femoral, dorsalis pedis pulses bilaterally Musculoskeletal: No deformity or edema  Neurologic: No facial asymmetry left upper extremity 0/5, left lower extremity 4/5, right upper and lower extremity 5/5  DATA: CTA reviewed mid hyoid right ICA stenosis 80%, 50-60% left ICA, great vessels no real stenosis    ASSESSMENT:  Symptomatic right ICA stenosis with stroke   PLAN:  Will allow 2 weeks recovery with plan to proceed with CEA in 2 weeks if no decline in condition or new events.  Ruta Hinds, MD [facility] [place] Office: [phone] Pager: [phone]

## 2014-08-05 NOTE — Progress Notes (Signed)
Dr Linna Caprice injected left double lumen catheter site and sutured stiches to stop oozing. Patient tolerated well. Oozing stopped, sterile dressing applied per Dr Linna Caprice.

## 2014-08-05 NOTE — Progress Notes (Signed)
Pt in PACU, awake and alert Small hematoma right neck, overall soft, no airway compromise Oozing form left IJ catheter being addressed by Dr Linna Caprice from anesthesia  Stable post op Will stop Plavix and do aspirin alone starting tomorrow with her oozing at neck IV site and now hematoma of left hand  Ruta Hinds, MD [facility] [place]: [phone] Pager: [phone]

## 2014-08-05 NOTE — Progress Notes (Signed)
Subjective/Complaints: Appreciate vasc surgery note  Review of systems positive for left upper extremity weakness, no bowel or bladder difficulties, no breathing problems, otherwise negative  Objective: Vital Signs: Blood pressure 98/52, pulse 73, temperature 97.6 F (36.4 C), temperature source Oral, resp. rate 18, height 5' 2.5" (1.588 m), weight 94 kg (207 lb 3.7 oz), SpO2 96 %. No results found. Results for orders placed or performed during the hospital encounter of 07/30/14 (from the past 72 hour(s))  Glucose, capillary     Status: Abnormal   Collection Time: 08/02/14 12:01 PM  Result Value Ref Range   Glucose-Capillary 112 (H) 70 - 99 mg/dL  Glucose, capillary     Status: Abnormal   Collection Time: 08/02/14  4:31 PM  Result Value Ref Range   Glucose-Capillary 138 (H) 70 - 99 mg/dL   Comment 1 Notify RN   Glucose, capillary     Status: Abnormal   Collection Time: 08/02/14  8:47 PM  Result Value Ref Range   Glucose-Capillary 109 (H) 70 - 99 mg/dL  Glucose, capillary     Status: Abnormal   Collection Time: 08/03/14  6:58 AM  Result Value Ref Range   Glucose-Capillary 167 (H) 70 - 99 mg/dL  Glucose, capillary     Status: Abnormal   Collection Time: 08/03/14 11:26 AM  Result Value Ref Range   Glucose-Capillary 116 (H) 70 - 99 mg/dL   Comment 1 Notify RN   Glucose, capillary     Status: Abnormal   Collection Time: 08/03/14  4:30 PM  Result Value Ref Range   Glucose-Capillary 148 (H) 70 - 99 mg/dL   Comment 1 Notify RN   Glucose, capillary     Status: Abnormal   Collection Time: 08/03/14  9:02 PM  Result Value Ref Range   Glucose-Capillary 114 (H) 70 - 99 mg/dL  Glucose, capillary     Status: Abnormal   Collection Time: 08/04/14  6:48 AM  Result Value Ref Range   Glucose-Capillary 146 (H) 70 - 99 mg/dL  Glucose, capillary     Status: Abnormal   Collection Time: 08/04/14 12:06 PM  Result Value Ref Range   Glucose-Capillary 116 (H) 70 - 99 mg/dL   Comment 1  Notify RN   Glucose, capillary     Status: Abnormal   Collection Time: 08/04/14  4:45 PM  Result Value Ref Range   Glucose-Capillary 116 (H) 70 - 99 mg/dL   Comment 1 Notify RN   Glucose, capillary     Status: Abnormal   Collection Time: 08/04/14  8:59 PM  Result Value Ref Range   Glucose-Capillary 160 (H) 70 - 99 mg/dL  Protime-INR     Status: None   Collection Time: 08/05/14  6:10 AM  Result Value Ref Range   Prothrombin Time 13.1 11.6 - 15.2 seconds   INR 0.98 0.00 - 1.49  CBC     Status: Abnormal   Collection Time: 08/05/14  6:10 AM  Result Value Ref Range   WBC 13.9 (H) 4.0 - 10.5 K/uL   RBC 5.13 (H) 3.87 - 5.11 MIL/uL   Hemoglobin 14.6 12.0 - 15.0 g/dL   HCT 43.9 36.0 - 46.0 %   MCV 85.6 78.0 - 100.0 fL   MCH 28.5 26.0 - 34.0 pg   MCHC 33.3 30.0 - 36.0 g/dL   RDW 15.2 11.5 - 15.5 %   Platelets 250 150 - 400 K/uL  Glucose, capillary     Status: Abnormal   Collection  Time: 08/05/14  6:48 AM  Result Value Ref Range   Glucose-Capillary 165 (H) 70 - 99 mg/dL     HEENT: normal Cardio: RRR and no murmurs Resp: CTA B/L and unlabored GI: BS positive and nontender nondistended Extremity:  Pulses positive and No Edema Skin:   Intact Neuro: Alert/Oriented, Cranial Nerve II-XII normal,  Abnormal Motor 3- triceps/  biceps as well as shoulder retraction, 3- abduction, 0 at the wrist and trace fingers,  left lower extremity 4/5 and hip flexor and extensor ankle dorsi flexor and Other , there is evidence of neglect Musc/Skel:  Other no pain with left shoulder range of motion Gen. no acute distress   Assessment/Plan: 1. Functional deficits secondary to right MCA infarct causing left hemiparesis Would need to D/C to [facility] for surgery and post op, probable readmit to CIR once stable post op FIM: FIM - Bathing Bathing Steps Patient Completed: Chest, Right Arm, Left Arm, Abdomen, Buttocks, Right upper leg, Right lower leg (including foot), Left upper leg, Left lower leg (including  foot), Front perineal area Bathing: 4: Steadying assist  FIM - Upper Body Dressing/Undressing Upper body dressing/undressing steps patient completed: Thread/unthread right sleeve of front closure shirt/dress Upper body dressing/undressing: 2: Max-Patient completed 25-49% of tasks FIM - Lower Body Dressing/Undressing Lower body dressing/undressing steps patient completed: Thread/unthread right pants leg Lower body dressing/undressing: 2: Max-Patient completed 25-49% of tasks  FIM - Toileting Toileting steps completed by patient: Adjust clothing prior to toileting, Performs perineal hygiene Toileting Assistive Devices: Grab bar or rail for support Toileting: 3: Mod-Patient completed 2 of 3 steps  FIM - Radio producer Devices: Product manager Transfers: 7-Independent: No helper, 5-To toilet/BSC: Supervision (verbal cues/safety issues), 5-From toilet/BSC: Supervision (verbal cues/safety issues)  FIM - IT sales professional Transfer: 4: Chair or W/C > Bed: Min A (steadying Pt. > 75%), 5: Bed > Chair or W/C: Supervision (verbal cues/safety issues)  FIM - Locomotion: Wheelchair Distance: 50 Locomotion: Wheelchair: 1: Total Assistance/staff pushes wheelchair (Pt<25%) FIM - Locomotion: Ambulation Locomotion: Ambulation Assistive Devices:  (no AD) Ambulation/Gait Assistance: 3: Mod assist Locomotion: Ambulation: 2: Travels 50 - 149 ft with moderate assistance (Pt: 50 - 74%)  Comprehension Comprehension Mode: Auditory Comprehension: 6-Follows complex conversation/direction: With extra time/assistive device  Expression Expression Mode: Verbal Expression: 6-Expresses complex ideas: With extra time/assistive device  Social Interaction Social Interaction: 6-Interacts appropriately with others with medication or extra time (anti-anxiety, antidepressant).  Problem Solving Problem Solving: 4-Solves basic 75 - 89% of the time/requires cueing 10 - 24% of the  time  Memory Memory: 4-Recognizes or recalls 75 - 89% of the time/requires cueing 10 - 24% of the time  Medical Problem List and Plan: 1. Functional deficits secondary to right MCA infarct causing left hemiparesis and Left hemisensory deficits For RIght  CEA today, discharge to Dr Malva Cogan for CIR post op   LOS (Days) 6 A FACE TO FACE EVALUATION WAS PERFORMED  Carla Hopkins 08/05/2014, 7:47 AM

## 2014-08-05 NOTE — Progress Notes (Signed)
Dr Linna Caprice at bedside evaluating oozing left IJ double lumen catheter. Dr Oneida Alar at bedside evaluating small hematoma right carotid incision site.

## 2014-08-06 ENCOUNTER — Telehealth: Payer: Self-pay | Admitting: Vascular Surgery

## 2014-08-06 ENCOUNTER — Encounter (HOSPITAL_COMMUNITY): Payer: Self-pay | Admitting: *Deleted

## 2014-08-06 ENCOUNTER — Inpatient Hospital Stay (HOSPITAL_COMMUNITY)
Admission: RE | Admit: 2014-08-06 | Discharge: 2014-08-22 | DRG: 065 | Disposition: A | Discharge status: Home Health | Payer: BLUE CROSS/BLUE SHIELD | Source: Intra-hospital | Attending: Physical Medicine & Rehabilitation | Admitting: Physical Medicine & Rehabilitation

## 2014-08-06 DIAGNOSIS — F4321 Adjustment disorder with depressed mood: Secondary | ICD-10-CM | POA: Diagnosis present

## 2014-08-06 DIAGNOSIS — E1142 Type 2 diabetes mellitus with diabetic polyneuropathy: Secondary | ICD-10-CM | POA: Diagnosis present

## 2014-08-06 DIAGNOSIS — R911 Solitary pulmonary nodule: Secondary | ICD-10-CM | POA: Diagnosis present

## 2014-08-06 DIAGNOSIS — R1310 Dysphagia, unspecified: Secondary | ICD-10-CM | POA: Diagnosis present

## 2014-08-06 DIAGNOSIS — R414 Neurologic neglect syndrome: Secondary | ICD-10-CM | POA: Diagnosis present

## 2014-08-06 DIAGNOSIS — E785 Hyperlipidemia, unspecified: Secondary | ICD-10-CM | POA: Diagnosis present

## 2014-08-06 DIAGNOSIS — E039 Hypothyroidism, unspecified: Secondary | ICD-10-CM | POA: Diagnosis present

## 2014-08-06 DIAGNOSIS — M6289 Other specified disorders of muscle: Secondary | ICD-10-CM | POA: Diagnosis not present

## 2014-08-06 DIAGNOSIS — G8194 Hemiplegia, unspecified affecting left nondominant side: Secondary | ICD-10-CM | POA: Diagnosis present

## 2014-08-06 DIAGNOSIS — R531 Weakness: Secondary | ICD-10-CM | POA: Diagnosis present

## 2014-08-06 DIAGNOSIS — S60222S Contusion of left hand, sequela: Secondary | ICD-10-CM | POA: Diagnosis not present

## 2014-08-06 DIAGNOSIS — E119 Type 2 diabetes mellitus without complications: Secondary | ICD-10-CM | POA: Diagnosis present

## 2014-08-06 DIAGNOSIS — M79642 Pain in left hand: Secondary | ICD-10-CM | POA: Diagnosis not present

## 2014-08-06 DIAGNOSIS — E1165 Type 2 diabetes mellitus with hyperglycemia: Secondary | ICD-10-CM | POA: Diagnosis not present

## 2014-08-06 DIAGNOSIS — I63511 Cerebral infarction due to unspecified occlusion or stenosis of right middle cerebral artery: Principal | ICD-10-CM | POA: Diagnosis present

## 2014-08-06 DIAGNOSIS — I10 Essential (primary) hypertension: Secondary | ICD-10-CM | POA: Diagnosis present

## 2014-08-06 DIAGNOSIS — Z8673 Personal history of transient ischemic attack (TIA), and cerebral infarction without residual deficits: Secondary | ICD-10-CM | POA: Diagnosis not present

## 2014-08-06 DIAGNOSIS — J449 Chronic obstructive pulmonary disease, unspecified: Secondary | ICD-10-CM | POA: Diagnosis present

## 2014-08-06 DIAGNOSIS — I6340 Cerebral infarction due to embolism of unspecified cerebral artery: Secondary | ICD-10-CM | POA: Diagnosis not present

## 2014-08-06 LAB — CBC
HCT: 41.7 % (ref 36.0–46.0)
HEMATOCRIT: 38.1 % (ref 36.0–46.0)
Hemoglobin: 12.7 g/dL (ref 12.0–15.0)
Hemoglobin: 13.6 g/dL (ref 12.0–15.0)
MCH: 28.8 pg (ref 26.0–34.0)
MCH: 28.6 pg (ref 26.0–34.0)
MCHC: 33.3 g/dL (ref 30.0–36.0)
MCHC: 32.6 g/dL (ref 30.0–36.0)
MCV: 86.4 fL (ref 78.0–100.0)
MCV: 87.6 fL (ref 78.0–100.0)
PLATELETS: 215 10*3/uL (ref 150–400)
Platelets: 206 10*3/uL (ref 150–400)
RBC: 4.41 MIL/uL (ref 3.87–5.11)
RBC: 4.76 MIL/uL (ref 3.87–5.11)
RDW: 15.1 % (ref 11.5–15.5)
RDW: 15.4 % (ref 11.5–15.5)
WBC: 16.5 10*3/uL — AB (ref 4.0–10.5)
WBC: 14.9 10*3/uL — ABNORMAL HIGH (ref 4.0–10.5)

## 2014-08-06 LAB — BASIC METABOLIC PANEL
ANION GAP: 6 (ref 5–15)
BUN: 15 mg/dL (ref 6–20)
CHLORIDE: 103 mmol/L (ref 101–111)
CO2: 27 mmol/L (ref 22–32)
CREATININE: 0.81 mg/dL (ref 0.44–1.00)
Calcium: 8.9 mg/dL (ref 8.9–10.3)
GFR calc Af Amer: >60 mL/min (ref >60)
GFR calc non Af Amer: >60 mL/min (ref >60)
GLUCOSE: 160 mg/dL — AB (ref 70–99)
Potassium: 4.4 mmol/L (ref 3.5–5.1)
Sodium: 136 mmol/L (ref 135–145)

## 2014-08-06 LAB — GLUCOSE, CAPILLARY
Glucose-Capillary: 147 mg/dL — ABNORMAL HIGH (ref 70–99)
Glucose-Capillary: 134 mg/dL — ABNORMAL HIGH (ref 70–99)
Glucose-Capillary: 136 mg/dL — ABNORMAL HIGH (ref 70–99)
Glucose-Capillary: 145 mg/dL — ABNORMAL HIGH (ref 70–99)

## 2014-08-06 LAB — CREATININE, SERUM
Creatinine, Ser: 0.88 mg/dL (ref 0.44–1.00)
GFR calc Af Amer: >60 mL/min (ref >60)
GFR calc non Af Amer: >60 mL/min (ref >60)

## 2014-08-06 MED ORDER — SORBITOL 70 % SOLN
30.0000 mL | Freq: Every day | Status: DC | PRN
  Administered 2014-08-07 – 2014-08-16 (×2): 30 mL via ORAL
  Filled 2014-08-06 (×2): qty 30

## 2014-08-06 MED ORDER — ASPIRIN 81 MG PO CHEW
81.0000 mg | CHEWABLE_TABLET | Freq: Every day | ORAL | Status: DC
  Administered 2014-08-07 – 2014-08-22 (×16): 81 mg via ORAL
  Filled 2014-08-06 (×18): qty 1

## 2014-08-06 MED ORDER — ONDANSETRON HCL 4 MG PO TABS: 4.0000 mg | ORAL_TABLET | Freq: Four times a day (QID) | ORAL | Status: DC | PRN

## 2014-08-06 MED ORDER — ENOXAPARIN SODIUM 30 MG/0.3ML ~~LOC~~ SOLN
30.0000 mg | SUBCUTANEOUS | Status: DC
  Administered 2014-08-07 – 2014-08-20 (×13): 30 mg via SUBCUTANEOUS
  Filled 2014-08-06 (×15): qty 0.3

## 2014-08-06 MED ORDER — PANTOPRAZOLE SODIUM 40 MG PO TBEC
40.0000 mg | DELAYED_RELEASE_TABLET | Freq: Every day | ORAL | Status: DC
  Administered 2014-08-07 – 2014-08-22 (×16): 40 mg via ORAL
  Filled 2014-08-06 (×18): qty 1

## 2014-08-06 MED ORDER — ONDANSETRON HCL 4 MG/2ML IJ SOLN: 4.0000 mg | Freq: Four times a day (QID) | INTRAMUSCULAR | Status: DC | PRN

## 2014-08-06 MED ORDER — SENNOSIDES-DOCUSATE SODIUM 8.6-50 MG PO TABS
1.0000 | ORAL_TABLET | Freq: Every evening | ORAL | Status: DC | PRN
  Administered 2014-08-16: 1 via ORAL
  Filled 2014-08-06: qty 1

## 2014-08-06 MED ORDER — ACETAMINOPHEN 325 MG PO TABS: 325.0000 mg | ORAL_TABLET | ORAL | Status: DC | PRN

## 2014-08-06 MED ORDER — INSULIN ASPART 100 UNIT/ML ~~LOC~~ SOLN
0.0000 [IU] | Freq: Three times a day (TID) | SUBCUTANEOUS | Status: DC
  Administered 2014-08-06 – 2014-08-21 (×9): 1 [IU] via SUBCUTANEOUS

## 2014-08-06 MED ORDER — DOCUSATE SODIUM 100 MG PO CAPS
100.0000 mg | ORAL_CAPSULE | Freq: Every day | ORAL | Status: DC
  Administered 2014-08-07 – 2014-08-14 (×7): 100 mg via ORAL
  Filled 2014-08-06 (×17): qty 1

## 2014-08-06 MED ORDER — LISINOPRIL 5 MG PO TABS
5.0000 mg | ORAL_TABLET | Freq: Every day | ORAL | Status: DC
  Administered 2014-08-07 – 2014-08-22 (×16): 5 mg via ORAL
  Filled 2014-08-06 (×17): qty 1

## 2014-08-06 MED ORDER — ACETAMINOPHEN 650 MG RE SUPP: 325.0000 mg | RECTAL | Status: DC | PRN

## 2014-08-06 MED ORDER — VITAMIN D (ERGOCALCIFEROL) 1.25 MG (50000 UNIT) PO CAPS
50000.0000 [IU] | ORAL_CAPSULE | ORAL | Status: DC
  Administered 2014-08-13 – 2014-08-20 (×2): 50000 [IU] via ORAL
  Filled 2014-08-06 (×2): qty 1

## 2014-08-06 MED ORDER — ATORVASTATIN CALCIUM 80 MG PO TABS
80.0000 mg | ORAL_TABLET | Freq: Every day | ORAL | Status: DC
  Administered 2014-08-06 – 2014-08-21 (×16): 80 mg via ORAL
  Filled 2014-08-06 (×17): qty 1

## 2014-08-06 MED ORDER — BISACODYL 10 MG RE SUPP: 10.0000 mg | Freq: Every day | RECTAL | Status: DC | PRN

## 2014-08-06 MED ORDER — METFORMIN HCL 500 MG PO TABS
500.0000 mg | ORAL_TABLET | Freq: Two times a day (BID) | ORAL | Status: DC
  Administered 2014-08-06 – 2014-08-22 (×32): 500 mg via ORAL
  Filled 2014-08-06 (×34): qty 1

## 2014-08-06 MED ORDER — LEVOTHYROXINE SODIUM 88 MCG PO TABS
88.0000 ug | ORAL_TABLET | Freq: Every day | ORAL | Status: DC
  Administered 2014-08-07 – 2014-08-22 (×16): 88 ug via ORAL
  Filled 2014-08-06 (×17): qty 1

## 2014-08-06 MED ORDER — ENOXAPARIN SODIUM 30 MG/0.3ML ~~LOC~~ SOLN: 30.0000 mg | SUBCUTANEOUS | Status: DC

## 2014-08-06 MED ORDER — OXYCODONE-ACETAMINOPHEN 5-325 MG PO TABS
1.0000 | ORAL_TABLET | ORAL | Status: DC | PRN
  Administered 2014-08-07 (×2): 2 via ORAL
  Administered 2014-08-11 (×2): 1 via ORAL
  Filled 2014-08-06 (×2): qty 2
  Filled 2014-08-06 (×2): qty 1

## 2014-08-06 MED ORDER — PHENOL 1.4 % MT LIQD
1.0000 | OROMUCOSAL | Status: DC | PRN
  Filled 2014-08-06: qty 177

## 2014-08-06 MED ORDER — OXYCODONE-ACETAMINOPHEN 5-325 MG PO TABS: 1.0000 | ORAL_TABLET | ORAL | Status: DC | PRN

## 2014-08-06 MED ORDER — GUAIFENESIN-DM 100-10 MG/5ML PO SYRP: 15.0000 mL | ORAL_SOLUTION | ORAL | Status: DC | PRN

## 2014-08-06 MED FILL — Thrombin For Soln 20000 Unit: CUTANEOUS | Qty: 1 | Status: AC

## 2014-08-06 NOTE — Discharge Summary (Signed)
[facility] Discharge Summary   Patient ID:  Carla Hopkins MRN: [id number] DOB/AGE: 1951-02-24 64 y.o.  Admit date: 08/05/2014 Discharge date: 08/06/2014 Date of Surgery: 08/05/2014 Surgeon: Surgeon(s): Angelia Mould, MD Elam Dutch, MD  Admission Diagnosis: Right carotid artery stenosis I65.21  Discharge Diagnoses:  Right carotid artery stenosis I65.21  Secondary Diagnoses: Past Medical History  Diagnosis Date  . Hypothyroidism   . Hypertension     Procedure(s): Right Carotid ENDARTERECTOMY  PATCH ANGIOPLASTY  Discharged Condition: stable  HPI: Reason for consult: acute stroke with right carotid stenosis History of Present Illness: Patient is a 64 y.o. female who presents for evaluation of symptomatic right internal carotid stenosis with stroke. Pt admitted yesterday with acute onset slurred speech, left arm profound and left leg weakness. CT showed greater than 80% stenosis right ICA, minimal left carotid stenosis. No prior stroke or TIA or amaurosis. She has had  improvement in speech, but little improvement in the left arm at this point. Other medical problems include hypertension, borderline diabetes, ? Elevated cholesterol, tobacco abuse. She is currently on ASA, lipitor, sliding scale insulin. She was started Plavix as well.   Hospital Course:  Carla Hopkins is a 64 y.o. female is S/P Right Procedure(s): Right Carotid ENDARTERECTOMY  PATCH ANGIOPLASTY Post -op stable.  Left tongue deviation, smile is symmetrical, left arm no active range of motion below the shoulder.  Significant left hand edema and ecchymosis from prior to surgery.  Right neck incision soft, no frank hematoma and healing well.  Disposition stable will await transfer back to CIR for continued rehab post CVA.    Significant Diagnostic Studies: CBC Lab Results  Component Value Date   WBC 16.5* 08/06/2014   HGB 12.7 08/06/2014   HCT 38.1 08/06/2014   MCV  86.4 08/06/2014   PLT 206 08/06/2014    BMET    Component Value Date/Time   NA 136 08/06/2014 0230   K 4.4 08/06/2014 0230   CL 103 08/06/2014 0230   CO2 27 08/06/2014 0230   GLUCOSE 160* 08/06/2014 0230   BUN 15 08/06/2014 0230   CREATININE 0.81 08/06/2014 0230   CREATININE 0.75 07/03/2014 1705   CALCIUM 8.9 08/06/2014 0230   GFRNONAA >60 08/06/2014 0230   GFRAA >60 08/06/2014 0230   COAG Lab Results  Component Value Date   INR 0.98 08/05/2014   INR 0.97 07/24/2014     Disposition:  Discharge to :Rehab    Medication List    ASK your doctor about these medications        albuterol 108 (90 BASE) MCG/ACT inhaler  Commonly known as:  PROVENTIL HFA;VENTOLIN HFA  Inhale 2 puffs into the lungs every 4 (four) hours as needed for wheezing or shortness of breath (cough, shortness of breath or wheezing.).     aspirin 81 MG chewable tablet  Chew 1 tablet (81 mg total) by mouth daily.     atorvastatin 80 MG tablet  Commonly known as:  LIPITOR  Take 1 tablet (80 mg total) by mouth daily at 6 PM.     clopidogrel 75 MG tablet  Commonly known as:  PLAVIX  Take 1 tablet (75 mg total) by mouth daily.     ergocalciferol 50000 UNITS capsule  Commonly known as:  DRISDOL  Take 1 capsule (50,000 Units total) by mouth once a week.     ibuprofen 200 MG tablet  Commonly known as:  ADVIL,MOTRIN  Take 400 mg by mouth every 6 (six)  hours as needed for mild pain or moderate pain.     levothyroxine 88 MCG tablet  Commonly known as:  SYNTHROID, LEVOTHROID  Take 1 tablet (88 mcg total) by mouth daily.     lisinopril 5 MG tablet  Commonly known as:  PRINIVIL,ZESTRIL  Take 1 tablet (5 mg total) by mouth daily.     metFORMIN 500 MG tablet  Commonly known as:  GLUCOPHAGE  Take 1 tablet (500 mg total) by mouth 2 (two) times daily with a meal.       Verbal and written Discharge instructions given to the patient. Wound care per Discharge AVS     Follow-up Information    Follow up  with Ruta Hinds, MD In 2 weeks.   Specialties:  Vascular Surgery, Cardiology   Why:  sent message to office   Contact information:   [address] [place] [address] [phone]       Signed: Laurence Slate [facility] 08/06/2014, 7:46 AM  --- For VQI Registry use --- Instructions: Press F2 to tab through selections.  Delete question if not applicable.   Modified Rankin score at D/C (0-6): Rankin Score=4  IV medication needed for:  1. Hypertension: No 2. Hypotension: No  Post-op Complications: No  1. Post-op CVA or TIA: No  If yes: Event classification (right eye, left eye, right cortical, left cortical, verterobasilar, other):   If yes: Timing of event (intra-op, <6 hrs post-op, >=6 hrs post-op, unknown):   2. CN injury: No  If yes: CN  injuried   3. Myocardial infarction: No  If yes: Dx by (EKG or clinical, Troponin):   4.  CHF: No  5.  Dysrhythmia (new): No  6. Wound infection: No  7. Reperfusion symptoms: No  8. Return to OR: No  If yes: return to OR for (bleeding, neurologic, other CEA incision, other):   Discharge medications: Statin use:  Yes ASA use:  Yes Beta blocker use:  No  for medical reason   ACE-Inhibitor use:  yes P2Y12 Antagonist use: [x ] None, [ ]  Plavix, [ ]  Plasugrel, [ ]  Ticlopinine, [ ]  Ticagrelor, [ ]  Other, [ ]  No for medical reason, [ ]  Non-compliant, [x ] Not-indicated Anti-coagulant use:  [x ] None, [ ]  Warfarin, [ ]  Rivaroxaban, [ ]  Dabigatran, [ ]  Other, [ ]  No for medical reason, [ ]  Non-compliant, [ ]  Not-indicated

## 2014-08-06 NOTE — Progress Notes (Addendum)
[facility] [place]  Subjective  - Patient is sitting up in good spirits.  She has had 1 percocet this am for mild pain.     Objective 141/55 76 98.6 F (37 C) (Oral) 15 96%  Intake/Output Summary (Last 24 hours) at 08/06/14 0723 Last data filed at 08/06/14 0600  Gross per 24 hour  Intake 3716.67 ml  Output    825 ml  Net 2891.67 ml    Grip right hand 5/5 left 1/5.  Ecchymosis  and edema in left hand s/p blood draws/infiltrate prior to surgery. Left tongue deviation, smile is symmetric and speech is clear.   Heart RRR Lungs non labored breathing    Assessment/Planning: POD # 1 right CEA  Stable disposition s/p surgery Discharge to CIR for continue rehab Cr improved now 0.81 WBC elevated and has been may need UA, patient Carla Hopkins symptoms of UA and lungs are clear. WBC has been elevated since admission. Plavix was stopped and we will continue aspirin daily 81 mg   Laurence Slate [facility] 08/06/2014 7:23 AM -- Agree with above.  Minimal right neck swelling.  Neuro at baseline. Swallow intact. D/c central line when dc'd ok to return to rehab today if they will take her. Check UA for leukocytosis  Ruta Hinds, MD [facility] [place]: [phone] Pager: [phone]  Laboratory Lab Results:  Recent Labs  08/05/14 0610 08/06/14 0230  WBC 13.9* 16.5*  HGB 14.6 12.7  HCT 43.9 38.1  PLT 250 206   BMET  Recent Labs  08/05/14 0610 08/06/14 0230  NA 134* 136  K 4.0 4.4  CL 100* 103  CO2 22 27  GLUCOSE 154* 160*  BUN 31* 15  CREATININE 1.01* 0.81  CALCIUM 9.3 8.9    COAG Lab Results  Component Value Date   INR 0.98 08/05/2014   INR 0.97 07/24/2014   No results found for: PTT

## 2014-08-06 NOTE — H&P (View-Only) (Signed)
Physical Medicine and Rehabilitation Admission H&P    Chief complaint:Neck pain  HPI: Carla Hopkins is a 64 y.o. right handed female with history of tobacco abuse, hypertension. Patient lives alone independently prior to admission and working full time. Admitted 07/24/2014 after a fall noting left-sided weakness. No head trauma no loss of consciousness. MRI of the brain shows multifocal patchy acute right MCA territory infarcts. Additional 8 mm cortical infarct within the parasagittal cortical gray matter of the anterior right frontal lobe, likely right ACA distribution. MRA of the head with right MCA M2 inferior division occlusion. CT angiogram chest negative except for a 6 mm pulmonary nodule on the right with recommendation follow-up CT the chest in 6-12 months. Elevated troponin 4.17. Echocardiogram with ejection fraction of 70% and grade 1 diastolic dysfunction. Carotid Doppler with left 50-60% and right 80% ICA stenosis. Patient did not receive TPA. Vascular surgery Dr. Oneida Alar consulted in relation to ICA stenosis plan follow-up as an outpatient to proceed with possible CEA. Follow-up cardiology for elevated troponin not felt to be due to ACS per Dr. Burt Knack and no further workup was indicated. Neurology consulted presently on aspirin and Plavix for CVA prophylaxis for 3 months then Plavix alone. Subcutaneous heparin for DVT prophylaxis. Findings of elevated hemoglobin A1c of 8.5 and placed on Glucophage with sliding scale insulin. Dysphagia 2 nectar thick liquid diet with follow per speech therapy. Physica and occupational l therapy evaluations completed with recommendations of physical medicine rehabilitation consult. Patient was admitted for a comprehensive rehabilitation program 07/30/2014. She was requiring mod max assist for mobility and transfers some visual attention issues on the left. Arrangements were made by vascular surgery for right CEA she was discharged from acute care services  underwent right carotid surgery 08/05/2014 per Dr. Oneida Alar. She tolerated the procedure well. Her Plavix was discontinued currently maintained on aspirin 81 mg daily. She remains on Lovenox for DVT prophylaxis. Recent infiltration of left hand from IV that has been monitored closely with some increased swelling and bruising. She is to be read admitted back to inpatient rehabilitation services 08/06/2014.  ROS ROS Review of Systems  Respiratory: Positive for cough.   Occasional shortness of breath on exertion  All other systems reviewed and are negative   Past Medical History  Diagnosis Date  . Hypothyroidism   . Hypertension    Past Surgical History  Procedure Laterality Date  . Tubal ligation     Family History  Problem Relation Age of Onset  . Adopted: Yes   Social History:  reports that she has been smoking Cigarettes.  She has been smoking about 20.00 packs per day. She does not have any smokeless tobacco history on file. She reports that she drinks alcohol. She reports that she does not use illicit drugs. Allergies: No Known Allergies Medications Prior to Admission  Medication Sig Dispense Refill  . aspirin 81 MG chewable tablet Chew 1 tablet (81 mg total) by mouth daily.    Marland Kitchen atorvastatin (LIPITOR) 80 MG tablet Take 1 tablet (80 mg total) by mouth daily at 6 PM.    . clopidogrel (PLAVIX) 75 MG tablet Take 1 tablet (75 mg total) by mouth daily.    . ergocalciferol (DRISDOL) 50000 UNITS capsule Take 1 capsule (50,000 Units total) by mouth once a week. 12 capsule 1  . ibuprofen (ADVIL,MOTRIN) 200 MG tablet Take 400 mg by mouth every 6 (six) hours as needed for mild pain or moderate pain.     Marland Kitchen levothyroxine (SYNTHROID,  LEVOTHROID) 88 MCG tablet Take 1 tablet (88 mcg total) by mouth daily. 90 tablet 1  . lisinopril (PRINIVIL,ZESTRIL) 5 MG tablet Take 1 tablet (5 mg total) by mouth daily. 30 tablet   . metFORMIN (GLUCOPHAGE) 500 MG tablet Take 1 tablet (500 mg total) by mouth 2  (two) times daily with a meal. 60 tablet 0  . albuterol (PROVENTIL HFA;VENTOLIN HFA) 108 (90 BASE) MCG/ACT inhaler Inhale 2 puffs into the lungs every 4 (four) hours as needed for wheezing or shortness of breath (cough, shortness of breath or wheezing.). (Patient not taking: Reported on 07/03/2014) 1 Inhaler 12    Home: Home Living Family/patient expects to be discharged to:: Private residence Living Arrangements: Alone   Functional History:    Functional Status:  Mobility:   Skilled Therapeutic Interventions/Progress Updates:  tx1 focused on neuromuscular re-education during functional activities via demo, forced use, visual cues, VCs, manual cues. Unsupprted Sitting: trunk shortening/lengthening/rotating to L and R while reaching within BOS  Sit>< stand biased to L , blocked practice without use of UEs and without pressing LEs against mat Supported sitting: L hand gross grasp during Rx bottle lids removal, LUE forearm wt bearing and L visual attention Dynamic standing: trunk rotating with transitional RLe stepping, to match playing card onto board in front and to L Gait on steps: ascending and descending with step- to method x 1, min assist, with step- through method x 1, min assist, mod cues throughout for L attention for L foot placement, clearing L foot, L visual attention    Max assist needed x 1 during LOB L during dynamic standing activity, to prevent fall. L inattention impacts safety in standing.  Tx 2 concurrent therapy, focused on therapeutic activities and neuro re-ed via forced use, visual feedback, VCs, manual cues, demo.  W/c> NuStep x 3 minutes using bil LEs and RUE, x 4 minutes using bil LEs, focusing on L neutral hip rotation Supported sitting: R hand task stacking cups, requiring L rotation and visual attention to L; bil knee extension to hold feet symmetricallly high, while PT pushed pt in w/c Gait without AD, x 60' mod assist.including turn to L, x 2 focusing on L  foot clearance, slowing down, L visual attention keeping cones on left, and weaving in/out of cone mod/max assist        ADL:   Skilled Therapeutic Interventions/Progress Updates:    Pt began session by donning shorts, socks, and shoes. She reported not being comfortable with a man assisting with bathing and dressing so this was passed on to nursing. Pt unaware that she had already donned underpants and attempted to donn a second pair. Along with this she needed mod instructional cueing to begin dressing with the LLE first. Mod demonstrational cueing to cross the LLE over the right knee and donn sock and shoe. She was able to donn the right shoe but not able to tie either one. Once finished transitioned to the therapy gym for further work on LUE function. Performed weightbearing tasks in sitting with the LUE positioned beside of her on the mat. She as able to initiate some elbow extension in the LUE to assist with reciprical scooting with min assist. However, at times pt reported increased bicep pain as she does exhibit some increased flexor tone in the biceps. Educated pt to work on moving the LUE with slow controlled movements of internal and external rotation without shoulder hike. Pt transferred back to the wheelchair with min assist stand pivot and  returned to room with daughter present  Cognition: Cognition Orientation Level: Oriented X4    Physical Exam: Blood pressure 98/62, pulse 67, temperature 97.9 F (36.6 C), temperature source Oral, resp. rate 12, height '5\' 2"'  (1.575 m), weight 92.7 kg (204 lb 5.9 oz), SpO2 97 %. Physical Exam Constitutional: She is oriented to person, place, and time. NAD HENT: oral mucosa pink and moist Head: Normocephalic.  Eyes: EOM are normal.  Neck: Normal range of motion. Neck supple. No thyromegaly present. Carotid surgery site with moderate to mild swelling and redness small hematoma Cardiovascular: Normal rate and regular rhythm. no  murmur Respiratory: Effort normal and breath sounds normal. No respiratory distress. No wheezes or rales  GI: Soft. Bowel sounds are normal. She exhibits no distension.  Neurological: She is alert and oriented to person, place, and time. Quick in conversation and to process information.Follows commands. mild left inattention, left facial droop and tongue deviation. Speech dysarthric. Good insight and awareness. LUE: Remains 1+ to 2- delt,bic,ticep, 0 to trace/5 hi,wrist. LLE: 3+ HF/KE to 4/5 with ADF/APF. Sensation 1+/2 LUE--senses gross pain but lacks fine touch and proprioception. Marland Kitchen LLE with minimal sensory deficits. No resting tone. DTR's 1+.  Skin:. infiltration site left hand wrist with increased swelling and tenderness to touch and bruising with palpable pulses Psychiatric: She has a normal mood and affect. Her behavior is normal   Results for orders placed or performed during the hospital encounter of 08/05/14 (from the past 48 hour(s))  Type and screen     Status: None   Collection Time: 08/05/14  2:35 PM  Result Value Ref Range   ABO/RH(D) O POS    Antibody Screen NEG    Sample Expiration 08/08/2014   ABO/Rh     Status: None   Collection Time: 08/05/14  2:35 PM  Result Value Ref Range   ABO/RH(D) O POS   Glucose, capillary     Status: Abnormal   Collection Time: 08/05/14  7:03 PM  Result Value Ref Range   Glucose-Capillary 165 (H) 70 - 99 mg/dL  Glucose, capillary     Status: Abnormal   Collection Time: 08/05/14  8:49 PM  Result Value Ref Range   Glucose-Capillary 162 (H) 70 - 99 mg/dL  CBC     Status: Abnormal   Collection Time: 08/06/14  2:30 AM  Result Value Ref Range   WBC 16.5 (H) 4.0 - 10.5 K/uL   RBC 4.41 3.87 - 5.11 MIL/uL   Hemoglobin 12.7 12.0 - 15.0 g/dL   HCT 38.1 36.0 - 46.0 %   MCV 86.4 78.0 - 100.0 fL   MCH 28.8 26.0 - 34.0 pg   MCHC 33.3 30.0 - 36.0 g/dL   RDW 15.1 11.5 - 15.5 %   Platelets 206 150 - 400 K/uL  Basic metabolic panel     Status:  Abnormal   Collection Time: 08/06/14  2:30 AM  Result Value Ref Range   Sodium 136 135 - 145 mmol/L   Potassium 4.4 3.5 - 5.1 mmol/L   Chloride 103 101 - 111 mmol/L   CO2 27 22 - 32 mmol/L   Glucose, Bld 160 (H) 70 - 99 mg/dL   BUN 15 6 - 20 mg/dL   Creatinine, Ser 0.81 0.44 - 1.00 mg/dL   Calcium 8.9 8.9 - 10.3 mg/dL   GFR calc non Af Amer >60 >60 mL/min   GFR calc Af Amer >60 >60 mL/min    Comment: (NOTE) The eGFR has been calculated using  the CKD EPI equation. This calculation has not been validated in all clinical situations. eGFR's persistently <60 mL/min signify possible Chronic Kidney Disease.    Anion gap 6 5 - 15  Glucose, capillary     Status: Abnormal   Collection Time: 08/06/14  7:58 AM  Result Value Ref Range   Glucose-Capillary 147 (H) 70 - 99 mg/dL   Dg Chest Port 1 View  08/05/2014   CLINICAL DATA:  Central line placement.  EXAM: PORTABLE CHEST - 1 VIEW  COMPARISON:  Chest CT 07/24/2014.  Chest radiograph of 05/19/2013  FINDINGS: A left internal jugular line terminates at the confluence of the brachiocephalic veins.  Midline trachea. Cardiomegaly accentuated by AP portable technique. No pleural effusion or pneumothorax. Low lung volumes with resultant pulmonary interstitial prominence. No lobar consolidation.  IMPRESSION: Left internal jugular line terminating at the confluence of the brachiocephalic veins. If low SVC position is desired, consider advancement approximately 5 cm with repeat imaging.  Cardiomegaly and low lung volumes, without pneumothorax or other acute finding.   Electronically Signed   By: Abigail Miyamoto M.D.   On: 08/05/2014 21:03       Medical Problem List and Plan: 1. Functional deficits secondary to right MCA infarct 2.  DVT Prophylaxis/Anticoagulation: Subcutaneous Lovenox for DVT prophylaxis. Monitor for any bleeding episodes 3. Pain Management: Percocet as needed. Monitor with increased mobility 4. Right carotid stenosis. Status post carotid  endarterectomy 08/05/2014 5. Neuropsych: This patient is capable of making decisions on her own behalf. 6. Skin/Wound Care: Routine skin checks 7. Fluids/Electrolytes/Nutrition: Strict I and O's with follow-up chemistries 8. Dysphagia. Dysphagia #2 sent liquids follow-up speech therapy 9. Hypertension. Lisinopril 5 mg daily. Monitor with increased mobility 10. Diabetes mellitus. Hemoglobin A1c 8.5. Glucophage 500 mg twice a day. Check blood sugars before meals and at bedtime with diabetic teaching 11. Hypothyroidism. Synthroid 12. Hyperlipidemia. Lipitor 13. IV infiltration left hand. Conservative care he denies as directed 14. 6 mm pulmonary nodule identified on CT and GL chest. Recommendations follow-up CT of the chest in 6-12 months   Post Admission Physician Evaluation: 1. Functional deficits secondary  to right MCA, now s/p right CEA. 2. Patient is admitted to receive collaborative, interdisciplinary care between the physiatrist, rehab nursing staff, and therapy team. 3. Patient's level of medical complexity and substantial therapy needs in context of that medical necessity cannot be provided at a lesser intensity of care such as a SNF. 4. Patient has experienced substantial functional loss from his/her baseline which was documented above under the "Functional History" and "Functional Status" headings.  Judging by the patient's diagnosis, physical exam, and functional history, the patient has potential for functional progress which will result in measurable gains while on inpatient rehab.  These gains will be of substantial and practical use upon discharge  in facilitating mobility and self-care at the household level. 5. Physiatrist will provide 24 hour management of medical needs as well as oversight of the therapy plan/treatment and provide guidance as appropriate regarding the interaction of the two. 6. 24 hour rehab nursing will assist with bladder management, bowel management, safety,  skin/wound care, disease management, medication administration, pain management and patient education  and help integrate therapy concepts, techniques,education, etc. 7. PT will assess and treat for/with: Lower extremity strength, range of motion, stamina, balance, functional mobility, safety, adaptive techniques and equipment, NMR, visual-perceptual awareness, education, pain control.   Goals are: supervision to mod I. 8. OT will assess and treat for/with: ADL's, functional mobility, safety,  upper extremity strength, adaptive techniques and equipment, NMR, visual-perceptual awareness, .   Goals are: min assist to mod I. Therapy may not yet proceed with showering this patient. 9. SLP will assess and treat for/with: cognition, communication, swallowing.  Goals are: mod I. 10. Case Management and Social Worker will assess and treat for psychological issues and discharge planning. 11. Team conference will be held weekly to assess progress toward goals and to determine barriers to discharge. 12. Patient will receive at least 3 hours of therapy per day at least 5 days per week. 13. ELOS: 18-24 days       14. Prognosis:  good     Meredith Staggers, MD, [facility] 08/06/2014   08/06/2014

## 2014-08-06 NOTE — H&P (Signed)
Physical Medicine and Rehabilitation Admission H&P    Chief complaint:Neck pain  HPI: Carla Hopkins is a 64 y.o. right handed female with history of tobacco abuse, hypertension. Patient lives alone independently prior to admission and working full time. Admitted 07/24/2014 after a fall noting left-sided weakness. No head trauma no loss of consciousness. MRI of the brain shows multifocal patchy acute right MCA territory infarcts. Additional 8 mm cortical infarct within the parasagittal cortical gray matter of the anterior right frontal lobe, likely right ACA distribution. MRA of the head with right MCA M2 inferior division occlusion. CT angiogram chest negative except for a 6 mm pulmonary nodule on the right with recommendation follow-up CT the chest in 6-12 months. Elevated troponin 4.17. Echocardiogram with ejection fraction of 70% and grade 1 diastolic dysfunction. Carotid Doppler with left 50-60% and right 80% ICA stenosis. Patient did not receive TPA. Vascular surgery Dr. Oneida Alar consulted in relation to ICA stenosis plan follow-up as an outpatient to proceed with possible CEA. Follow-up cardiology for elevated troponin not felt to be due to ACS per Dr. Burt Knack and no further workup was indicated. Neurology consulted presently on aspirin and Plavix for CVA prophylaxis for 3 months then Plavix alone. Subcutaneous heparin for DVT prophylaxis. Findings of elevated hemoglobin A1c of 8.5 and placed on Glucophage with sliding scale insulin. Dysphagia 2 nectar thick liquid diet with follow per speech therapy. Physica and occupational l therapy evaluations completed with recommendations of physical medicine rehabilitation consult. Patient was admitted for a comprehensive rehabilitation program 07/30/2014. She was requiring mod max assist for mobility and transfers some visual attention issues on the left. Arrangements were made by vascular surgery for right CEA she was discharged from acute care services  underwent right carotid surgery 08/05/2014 per Dr. Oneida Alar. She tolerated the procedure well. Her Plavix was discontinued currently maintained on aspirin 81 mg daily. She remains on Lovenox for DVT prophylaxis. Recent infiltration of left hand from IV that has been monitored closely with some increased swelling and bruising. She is to be read admitted back to inpatient rehabilitation services 08/06/2014.  ROS ROS Review of Systems  Respiratory: Positive for cough.   Occasional shortness of breath on exertion  All other systems reviewed and are negative   Past Medical History  Diagnosis Date  . Hypothyroidism   . Hypertension    Past Surgical History  Procedure Laterality Date  . Tubal ligation     Family History  Problem Relation Age of Onset  . Adopted: Yes   Social History:  reports that she has been smoking Cigarettes.  She has been smoking about 20.00 packs per day. She does not have any smokeless tobacco history on file. She reports that she drinks alcohol. She reports that she does not use illicit drugs. Allergies: No Known Allergies Medications Prior to Admission  Medication Sig Dispense Refill  . aspirin 81 MG chewable tablet Chew 1 tablet (81 mg total) by mouth daily.    Marland Kitchen atorvastatin (LIPITOR) 80 MG tablet Take 1 tablet (80 mg total) by mouth daily at 6 PM.    . clopidogrel (PLAVIX) 75 MG tablet Take 1 tablet (75 mg total) by mouth daily.    . ergocalciferol (DRISDOL) 50000 UNITS capsule Take 1 capsule (50,000 Units total) by mouth once a week. 12 capsule 1  . ibuprofen (ADVIL,MOTRIN) 200 MG tablet Take 400 mg by mouth every 6 (six) hours as needed for mild pain or moderate pain.     Marland Kitchen levothyroxine (SYNTHROID,  LEVOTHROID) 88 MCG tablet Take 1 tablet (88 mcg total) by mouth daily. 90 tablet 1  . lisinopril (PRINIVIL,ZESTRIL) 5 MG tablet Take 1 tablet (5 mg total) by mouth daily. 30 tablet   . metFORMIN (GLUCOPHAGE) 500 MG tablet Take 1 tablet (500 mg total) by mouth 2  (two) times daily with a meal. 60 tablet 0  . albuterol (PROVENTIL HFA;VENTOLIN HFA) 108 (90 BASE) MCG/ACT inhaler Inhale 2 puffs into the lungs every 4 (four) hours as needed for wheezing or shortness of breath (cough, shortness of breath or wheezing.). (Patient not taking: Reported on 07/03/2014) 1 Inhaler 12    Home: Home Living Family/patient expects to be discharged to:: Private residence Living Arrangements: Alone   Functional History:    Functional Status:  Mobility:   Skilled Therapeutic Interventions/Progress Updates:  tx1 focused on neuromuscular re-education during functional activities via demo, forced use, visual cues, VCs, manual cues. Unsupprted Sitting: trunk shortening/lengthening/rotating to L and R while reaching within BOS  Sit>< stand biased to L , blocked practice without use of UEs and without pressing LEs against mat Supported sitting: L hand gross grasp during Rx bottle lids removal, LUE forearm wt bearing and L visual attention Dynamic standing: trunk rotating with transitional RLe stepping, to match playing card onto board in front and to L Gait on steps: ascending and descending with step- to method x 1, min assist, with step- through method x 1, min assist, mod cues throughout for L attention for L foot placement, clearing L foot, L visual attention    Max assist needed x 1 during LOB L during dynamic standing activity, to prevent fall. L inattention impacts safety in standing.  Tx 2 concurrent therapy, focused on therapeutic activities and neuro re-ed via forced use, visual feedback, VCs, manual cues, demo.  W/c> NuStep x 3 minutes using bil LEs and RUE, x 4 minutes using bil LEs, focusing on L neutral hip rotation Supported sitting: R hand task stacking cups, requiring L rotation and visual attention to L; bil knee extension to hold feet symmetricallly high, while PT pushed pt in w/c Gait without AD, x 60' mod assist.including turn to L, x 2 focusing on L  foot clearance, slowing down, L visual attention keeping cones on left, and weaving in/out of cone mod/max assist        ADL:   Skilled Therapeutic Interventions/Progress Updates:    Pt began session by donning shorts, socks, and shoes. She reported not being comfortable with a man assisting with bathing and dressing so this was passed on to nursing. Pt unaware that she had already donned underpants and attempted to donn a second pair. Along with this she needed mod instructional cueing to begin dressing with the LLE first. Mod demonstrational cueing to cross the LLE over the right knee and donn sock and shoe. She was able to donn the right shoe but not able to tie either one. Once finished transitioned to the therapy gym for further work on LUE function. Performed weightbearing tasks in sitting with the LUE positioned beside of her on the mat. She as able to initiate some elbow extension in the LUE to assist with reciprical scooting with min assist. However, at times pt reported increased bicep pain as she does exhibit some increased flexor tone in the biceps. Educated pt to work on moving the LUE with slow controlled movements of internal and external rotation without shoulder hike. Pt transferred back to the wheelchair with min assist stand pivot and  returned to room with daughter present  Cognition: Cognition Orientation Level: Oriented X4    Physical Exam: Blood pressure 98/62, pulse 67, temperature 97.9 F (36.6 C), temperature source Oral, resp. rate 12, height '5\' 2"'  (1.575 m), weight 92.7 kg (204 lb 5.9 oz), SpO2 97 %. Physical Exam Constitutional: She is oriented to person, place, and time. NAD HENT: oral mucosa pink and moist Head: Normocephalic.  Eyes: EOM are normal.  Neck: Normal range of motion. Neck supple. No thyromegaly present. Carotid surgery site with moderate to mild swelling and redness small hematoma Cardiovascular: Normal rate and regular rhythm. no  murmur Respiratory: Effort normal and breath sounds normal. No respiratory distress. No wheezes or rales  GI: Soft. Bowel sounds are normal. She exhibits no distension.  Neurological: She is alert and oriented to person, place, and time. Quick in conversation and to process information.Follows commands. mild left inattention, left facial droop and tongue deviation. Speech dysarthric. Good insight and awareness. LUE: Remains 1+ to 2- delt,bic,ticep, 0 to trace/5 hi,wrist. LLE: 3+ HF/KE to 4/5 with ADF/APF. Sensation 1+/2 LUE--senses gross pain but lacks fine touch and proprioception. Marland Kitchen LLE with minimal sensory deficits. No resting tone. DTR's 1+.  Skin:. infiltration site left hand wrist with increased swelling and tenderness to touch and bruising with palpable pulses Psychiatric: She has a normal mood and affect. Her behavior is normal   Results for orders placed or performed during the hospital encounter of 08/05/14 (from the past 48 hour(s))  Type and screen     Status: None   Collection Time: 08/05/14  2:35 PM  Result Value Ref Range   ABO/RH(D) O POS    Antibody Screen NEG    Sample Expiration 08/08/2014   ABO/Rh     Status: None   Collection Time: 08/05/14  2:35 PM  Result Value Ref Range   ABO/RH(D) O POS   Glucose, capillary     Status: Abnormal   Collection Time: 08/05/14  7:03 PM  Result Value Ref Range   Glucose-Capillary 165 (H) 70 - 99 mg/dL  Glucose, capillary     Status: Abnormal   Collection Time: 08/05/14  8:49 PM  Result Value Ref Range   Glucose-Capillary 162 (H) 70 - 99 mg/dL  CBC     Status: Abnormal   Collection Time: 08/06/14  2:30 AM  Result Value Ref Range   WBC 16.5 (H) 4.0 - 10.5 K/uL   RBC 4.41 3.87 - 5.11 MIL/uL   Hemoglobin 12.7 12.0 - 15.0 g/dL   HCT 38.1 36.0 - 46.0 %   MCV 86.4 78.0 - 100.0 fL   MCH 28.8 26.0 - 34.0 pg   MCHC 33.3 30.0 - 36.0 g/dL   RDW 15.1 11.5 - 15.5 %   Platelets 206 150 - 400 K/uL  Basic metabolic panel     Status:  Abnormal   Collection Time: 08/06/14  2:30 AM  Result Value Ref Range   Sodium 136 135 - 145 mmol/L   Potassium 4.4 3.5 - 5.1 mmol/L   Chloride 103 101 - 111 mmol/L   CO2 27 22 - 32 mmol/L   Glucose, Bld 160 (H) 70 - 99 mg/dL   BUN 15 6 - 20 mg/dL   Creatinine, Ser 0.81 0.44 - 1.00 mg/dL   Calcium 8.9 8.9 - 10.3 mg/dL   GFR calc non Af Amer >60 >60 mL/min   GFR calc Af Amer >60 >60 mL/min    Comment: (NOTE) The eGFR has been calculated using  the CKD EPI equation. This calculation has not been validated in all clinical situations. eGFR's persistently <60 mL/min signify possible Chronic Kidney Disease.    Anion gap 6 5 - 15  Glucose, capillary     Status: Abnormal   Collection Time: 08/06/14  7:58 AM  Result Value Ref Range   Glucose-Capillary 147 (H) 70 - 99 mg/dL   Dg Chest Port 1 View  08/05/2014   CLINICAL DATA:  Central line placement.  EXAM: PORTABLE CHEST - 1 VIEW  COMPARISON:  Chest CT 07/24/2014.  Chest radiograph of 05/19/2013  FINDINGS: A left internal jugular line terminates at the confluence of the brachiocephalic veins.  Midline trachea. Cardiomegaly accentuated by AP portable technique. No pleural effusion or pneumothorax. Low lung volumes with resultant pulmonary interstitial prominence. No lobar consolidation.  IMPRESSION: Left internal jugular line terminating at the confluence of the brachiocephalic veins. If low SVC position is desired, consider advancement approximately 5 cm with repeat imaging.  Cardiomegaly and low lung volumes, without pneumothorax or other acute finding.   Electronically Signed   By: Abigail Miyamoto M.D.   On: 08/05/2014 21:03       Medical Problem List and Plan: 1. Functional deficits secondary to right MCA infarct 2.  DVT Prophylaxis/Anticoagulation: Subcutaneous Lovenox for DVT prophylaxis. Monitor for any bleeding episodes 3. Pain Management: Percocet as needed. Monitor with increased mobility 4. Right carotid stenosis. Status post carotid  endarterectomy 08/05/2014 5. Neuropsych: This patient is capable of making decisions on her own behalf. 6. Skin/Wound Care: Routine skin checks 7. Fluids/Electrolytes/Nutrition: Strict I and O's with follow-up chemistries 8. Dysphagia. Dysphagia #2 sent liquids follow-up speech therapy 9. Hypertension. Lisinopril 5 mg daily. Monitor with increased mobility 10. Diabetes mellitus. Hemoglobin A1c 8.5. Glucophage 500 mg twice a day. Check blood sugars before meals and at bedtime with diabetic teaching 11. Hypothyroidism. Synthroid 12. Hyperlipidemia. Lipitor 13. IV infiltration left hand. Conservative care he denies as directed 14. 6 mm pulmonary nodule identified on CT and GL chest. Recommendations follow-up CT of the chest in 6-12 months   Post Admission Physician Evaluation: 1. Functional deficits secondary  to right MCA, now s/p right CEA. 2. Patient is admitted to receive collaborative, interdisciplinary care between the physiatrist, rehab nursing staff, and therapy team. 3. Patient's level of medical complexity and substantial therapy needs in context of that medical necessity cannot be provided at a lesser intensity of care such as a SNF. 4. Patient has experienced substantial functional loss from his/her baseline which was documented above under the "Functional History" and "Functional Status" headings.  Judging by the patient's diagnosis, physical exam, and functional history, the patient has potential for functional progress which will result in measurable gains while on inpatient rehab.  These gains will be of substantial and practical use upon discharge  in facilitating mobility and self-care at the household level. 5. Physiatrist will provide 24 hour management of medical needs as well as oversight of the therapy plan/treatment and provide guidance as appropriate regarding the interaction of the two. 6. 24 hour rehab nursing will assist with bladder management, bowel management, safety,  skin/wound care, disease management, medication administration, pain management and patient education  and help integrate therapy concepts, techniques,education, etc. 7. PT will assess and treat for/with: Lower extremity strength, range of motion, stamina, balance, functional mobility, safety, adaptive techniques and equipment, NMR, visual-perceptual awareness, education, pain control.   Goals are: supervision to mod I. 8. OT will assess and treat for/with: ADL's, functional mobility, safety,  upper extremity strength, adaptive techniques and equipment, NMR, visual-perceptual awareness, .   Goals are: min assist to mod I. Therapy may not yet proceed with showering this patient. 9. SLP will assess and treat for/with: cognition, communication, swallowing.  Goals are: mod I. 10. Case Management and Social Worker will assess and treat for psychological issues and discharge planning. 11. Team conference will be held weekly to assess progress toward goals and to determine barriers to discharge. 12. Patient will receive at least 3 hours of therapy per day at least 5 days per week. 13. ELOS: 18-24 days       14. Prognosis:  good     Meredith Staggers, MD, [place] [facility] 08/06/2014   08/06/2014

## 2014-08-06 NOTE — Interval H&P Note (Signed)
Carla Hopkins was admitted today to Inpatient Rehabilitation with the diagnosis of Right MCA infarct.  The patient's history has been reviewed, patient examined, and there is no change in status.  Patient continues to be appropriate for intensive inpatient rehabilitation.  I have reviewed the patient's chart and labs.  Questions were answered to the patient's satisfaction.  [name] T 08/06/2014, 9:54 PM

## 2014-08-06 NOTE — Telephone Encounter (Signed)
-----   Message from Denman George, RN sent at 08/06/2014  9:15 AM EDT ----- Regarding: Zigmund Daniel log; also needs 2 wk. f/u with [facility]   ----- Message -----    From: Ulyses Amor, PA-C    Sent: 08/06/2014   7:45 AM      To: [facility] Charge Pool  S/P Dr.  Oneida Alar CEA right f/u in 2weeks

## 2014-08-06 NOTE — Progress Notes (Signed)
Report called to [facility] in [place]. AVS discussed with pt, all personal belongings with pt. VSS, central line removed, IV remained in. CCMD and Elink notified of discharge.

## 2014-08-06 NOTE — Progress Notes (Signed)
Patient arrived onto Rehab via wheelchair with belongings and assisted by nurse. Patient able to transfer from wheelchair to recliner with +1 minimal assistance. Patient reoriented to rehab. Patient verbalized understanding of safety plan and agreement. Patient oriented to her room and call bell. Will continue to monitor patient.

## 2014-08-06 NOTE — Telephone Encounter (Signed)
Left message for pt ,dpm

## 2014-08-06 NOTE — PMR Pre-admission (Signed)
PMR Admission Coordinator Pre-Admission Assessment  Patient: Carla Hopkins is an 64 y.o., female MRN: [id number] DOB: 17-Aug-1950 Height: _0  (157.5 cm) Weight: 92.7 kg (204 lb 5.9 oz)   Insurance Information HMO:     PPO:      PCP:      IPA:      80/20:      OTHER: [employer] PRIMARY: [employer] [place]      Policy#: [id number]      Subscriber: self CM Name: Delight Stare, RN      Phone#: [phone], ext. [phone]     Fax#: [phone] Verbal approval given on 08-06-14 for seven days from 08-06-14 through 08-13-14 with updates due to [facility] on 08-13-14 at above fax   Pre-Cert#: [id number]      Employer: [employer] Benefits:  Phone #: [phone]     Name: per [place]. Date: 05-27-14     Deduct: $2000 (met $325)      Out of Pocket Max: $5350 (met $325)      Life Max: none CIR: 80/20%, pre-auth needed      SNF: 80/20% (120 days visit limits) Outpatient: 80/20%     Co-Pay:  no copay, no visit limits Home Health: 80/20%      Co-Pay: none, 100 visit limits DME: 80/20%     Co-Pay: none Providers: in network              Emergency Contact Information Contact Information    Name [place] Daughter [place] [phone]  [phone]   [facility] Neighbor [phone]  [phone]   [place] [phone]  [phone]     Current Medical History  Patient Admitting Diagnosis: Multifocal patchy acute right MCA territory infarct, now s/p right CEA  History of Present Illness: Carla Hopkins is a 64 y.o. right handed female with history of tobacco abuse, hypertension. Patient lives alone independently prior to admission and working full time. Admitted 07/24/2014 after a fall noting left-sided weakness. No head trauma no loss of consciousness. MRI of the brain shows multifocal patchy acute right MCA territory infarcts. Additional 8 mm cortical infarct within the parasagittal cortical gray matter of the anterior right  frontal lobe, likely right ACA distribution. MRA of the head with right MCA M2 inferior division occlusion. CT angiogram chest negative except for a 6 mm pulmonary nodule on the right with recommendation follow-up CT the chest in 6-12 months. Elevated troponin 4.17. Echocardiogram with ejection fraction of 70% and grade 1 diastolic dysfunction. Carotid Doppler with left 50-60% and right 80% ICA stenosis. Patient did not receive TPA. Vascular surgery Dr. Oneida Alar consulted in relation to ICA stenosis plan follow-up as an outpatient to proceed with possible CEA. Follow-up cardiology for elevated troponin not felt to be due to ACS per Dr. Burt Knack and no further workup was indicated. Neurology consulted presently on aspirin and Plavix for CVA prophylaxis for 3 months then Plavix alone. Subcutaneous heparin for DVT prophylaxis. Findings of elevated hemoglobin A1c of 8.5 and placed on Glucophage with sliding scale insulin. Dysphagia 2 nectar thick liquid diet with follow per speech therapy. Physica and occupational therapy evaluations completed with recommendations of physical medicine rehabilitation consult. Patient was admitted for a comprehensive rehabilitation program 07/30/2014. She was requiring mod max assist for mobility and transfers some visual attention issues on the left. Arrangements were made by vascular surgery for right CEA she was discharged from acute care services underwent right carotid  surgery 08/05/2014 per Dr. Oneida Alar. She tolerated the procedure well. Her Plavix was discontinued currently maintained on aspirin 81 mg daily. She remains on Lovenox for DVT prophylaxis. Recent infiltration of left hand from IV that has been monitored closely with some increased swelling and bruising. She is to be admitted back to inpatient rehabilitation services 08/06/2014.   Past Medical History  Past Medical History  Diagnosis Date  . Hypothyroidism   . Hypertension     Family History  family history is not  on file. She was adopted.  Prior Rehab/Hospitalizations: none   Current Medications   Current facility-administered medications:  .  0.9 %  sodium chloride infusion, , Intravenous, Continuous, Ulyses Amor, PA-C, Stopped at 08/06/14 0600 .  acetaminophen (TYLENOL) tablet 325-650 mg, 325-650 mg, Oral, Q4H PRN **OR** acetaminophen (TYLENOL) suppository 325-650 mg, 325-650 mg, Rectal, Q4H PRN, Ulyses Amor, PA-C .  alum & mag hydroxide-simeth (MAALOX/MYLANTA) 200-200-20 MG/5ML suspension 15-30 mL, 15-30 mL, Oral, Q2H PRN, Ulyses Amor, PA-C .  aspirin chewable tablet 81 mg, 81 mg, Oral, Daily, Ulyses Amor, PA-C, 81 mg at 08/06/14 1014 .  atorvastatin (LIPITOR) tablet 80 mg, 80 mg, Oral, q1800, Ulyses Amor, PA-C .  bisacodyl (DULCOLAX) suppository 10 mg, 10 mg, Rectal, Daily PRN, Ulyses Amor, PA-C .  cefUROXime (ZINACEF) 1.5 g in dextrose 5 % 50 mL IVPB, 1.5 g, Intravenous, Q12H, Emma M Collins, PA-C, 1.5 g at 08/06/14 0300 .  docusate sodium (COLACE) capsule 100 mg, 100 mg, Oral, Daily, Ulyses Amor, PA-C, 100 mg at 08/06/14 1013 .  enoxaparin (LOVENOX) injection 30 mg, 30 mg, Subcutaneous, Q24H, Emma M Collins, PA-C, 30 mg at 08/06/14 1015 .  guaiFENesin-dextromethorphan (ROBITUSSIN DM) 100-10 MG/5ML syrup 15 mL, 15 mL, Oral, Q4H PRN, Ulyses Amor, PA-C .  hydrALAZINE (APRESOLINE) injection 5 mg, 5 mg, Intravenous, Q20 Min PRN, Ulyses Amor, PA-C .  ibuprofen (ADVIL,MOTRIN) tablet 400 mg, 400 mg, Oral, Q6H PRN, Ulyses Amor, PA-C .  insulin aspart (novoLOG) injection 0-9 Units, 0-9 Units, Subcutaneous, TID WC, Ulyses Amor, PA-C, 1 Units at 08/06/14 1013 .  labetalol (NORMODYNE,TRANDATE) injection 10 mg, 10 mg, Intravenous, Q10 min PRN, Ulyses Amor, PA-C .  levothyroxine (SYNTHROID, LEVOTHROID) tablet 88 mcg, 88 mcg, Oral, QAC breakfast, Ulyses Amor, PA-C, 88 mcg at 08/06/14 1013 .  lisinopril (PRINIVIL,ZESTRIL) tablet 5 mg, 5 mg, Oral, Daily, Ulyses Amor, PA-C,  5 mg at 08/05/14 2200 .  magnesium sulfate IVPB 2 g 50 mL, 2 g, Intravenous, Daily PRN, Ulyses Amor, PA-C .  metFORMIN (GLUCOPHAGE) tablet 500 mg, 500 mg, Oral, BID WC, Ulyses Amor, PA-C, 500 mg at 08/06/14 1014 .  metoprolol (LOPRESSOR) injection 2-5 mg, 2-5 mg, Intravenous, Q2H PRN, Ulyses Amor, PA-C .  morphine 2 MG/ML injection 2-5 mg, 2-5 mg, Intravenous, Q1H PRN, Ulyses Amor, PA-C, 2 mg at 08/05/14 2218 .  ondansetron [facility]) injection 4 mg, 4 mg, Intravenous, Q6H PRN, Ulyses Amor, PA-C, 4 mg at 08/05/14 2111 .  oxyCODONE-acetaminophen (PERCOCET/ROXICET) 5-325 MG per tablet 1-2 tablet, 1-2 tablet, Oral, Q4H PRN, Ulyses Amor, PA-C, 1 tablet at 08/06/14 0719 .  pantoprazole (PROTONIX) EC tablet 40 mg, 40 mg, Oral, Daily, Ulyses Amor, PA-C, 40 mg at 08/06/14 1014 .  phenol (CHLORASEPTIC) mouth spray 1 spray, 1 spray, Mouth/Throat, PRN, Ulyses Amor, PA-C .  potassium chloride SA (K-DUR,KLOR-CON) CR tablet 20-40 mEq, 20-40 mEq, Oral, Daily PRN,  Ulyses Amor, PA-C .  senna-docusate (Senokot-S) tablet 1 tablet, 1 tablet, Oral, QHS PRN, Ulyses Amor, PA-C .  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 She enjoys her four cats and has very supportive neighbors. Her dtr is also very supportive and lives in [place].   Home Assistive Devices / Equipment Home Assistive Devices/Equipment: Eyeglasses, CPAP Home Equipment: None  Prior Functional Level Prior Function Level of Independence: Independent Comments: works for [employer]  Current Functional Level Cognition  Orientation Level: Oriented X4    Extremity Assessment (includes Sensation/Coordination)  LUE Deficits / Details: Brunstrum 0 forearm and distally; Brunstrum 1-elbow and proximally LUE Sensation: decreased light touch, decreased proprioception (can feel pain (pinch), but still not as strong as is on right arm--and pt is aware of this) LUE Coordination: decreased fine motor, decreased gross motor  RLE Deficits / Details: AROM WFL, strength hip flexion 4-/5 due to pain, knee extension 4/5, ankle DF 4+/5 LLE Deficits / Details: AAROM WFL, strength hip flexion 3/5, knee extension 4-/5 ankle DF 3+/5    ADLs    See OT evaluation on 07-31-14 while on CIR. Pt is overall moderate assistance with ADL tasks and has L UE inattention and sensory issues.   Mobility    See PT evaluation completed on 07-31-14 while on CIR. Pt is overall minimal to moderate assistance with mobility and needs moderate to maximal assistance for L attention during all functional activities.    Transfers   Minimal to moderate assist with stand pivot transfers with L sided inattention.     Ambulation / Gait / Stairs / Wheelchair Mobility  Ambulation/Gait General Gait Details: pt with noted L sided neglect, L LE decreased DF, decreased step height/dragging foot, L knee buckling/limp. pt with no proprioceptive input to self correct. max v/c's to make L knee straight and pick up foot to clear floor. PT provided tactile cueing at buttocks to promote stability to allow for better L LE gait kinematics while rehab tech provide physical support on R side. pt unable to use RW due to inability to grip with L hand. pt also impulsive requiring max v/c's to slow down to normal pace Gait velocity: impulsively quick    Posture / Balance Static Sitting Balance Static Sitting - Level of Assistance: 5: Stand by assistance Dynamic Sitting Balance Dynamic Sitting - Level of Assistance: 5: Stand by  assistance Sitting balance - Comments: accepts challenges, but reaching to left minguard for safety when outside base of support Static Standing Balance Static Standing - Level of Assistance: 4: Min assist Dynamic Standing Balance Dynamic Standing - Level of Assistance: 4: Min assist, 3: Mod assist (depends on L inattention) Balance Sitting balance - Comments: accepts challenges, but reaching to left minguard for safety when outside base of support    Special needs/care consideration BiPAP/CPAP - yes, uses CPAP at home  CPM no Continuous Drip IV no   Dialysis no           Life Vest no   Oxygen no   Special Bed no   Trach Size no   Wound Vac (area) no  Skin - pt has a rash on bilateral heels and bruise on L foot per pt report                             Bowel mgmt: last BM on 08-04-14 Bladder mgmt: currently using bedpan    Diabetic mgmt - newly diabetic, using insulin     Previous Home Environment Living Arrangements: Alone  Lives With: Alone Available Help at Discharge: Friend(s), Available PRN/intermittently Type of Home: House Home Layout: One level Home Access: Stairs to enter Entrance Stairs-Rails: None (pt stated someone is going to build her a ramp with bil rails) Entrance Stairs-Number of Steps: 3 Bathroom Shower/Tub: Tub/shower unit, Architectural technologist: [place]: No  Discharge Living Setting Plans for Discharge Living Setting: Patient's home, Alone Type of Home at Discharge: House Discharge Home Layout: One level Discharge Home Access: Stairs to enter Entrance Stairs-Rails:  (hand rail is being built per pt) Entrance Stairs-Number of Steps: 3 Does the patient have any problems obtaining your medications?: No  Social/Family/Support Systems Patient Roles: Parent, Other (Comment) (employee-[employer]) Contact Information: dtr [name] is primary contact Anticipated Caregiver: daughter hopes to return for a few days upon discharge.  Neighbors  can check on her Ability/Limitations of Caregiver: See above-daughter to hopefully come back for a few days upon discharge Caregiver Availability: Intermittent Discharge Plan Discussed with Primary Caregiver: Yes Is Caregiver In Agreement with Plan?: Yes Does Caregiver/Family have Issues with Lodging/Transportation while Pt is in Rehab?: No  Goals/Additional Needs Patient/Family Goal for Rehab: Mod Ind and Supervision with PT/OT; Mod Ind with SLP Expected length of stay: 15-18 days (Pt was at CIR from 5-4 through 5-10, then had CEA sx.) Cultural Considerations: none Dietary Needs: heart healthy, carb modified Equipment Needs: to be determined Pt/Family Agrees to Admission and willing to participate: Yes (spoke with pt and her dtr) Program Orientation Provided & Reviewed with Pt/Caregiver Including Roles  & Responsibilities: Yes   Decrease burden of Care through IP rehab admission: NA   Possible need for SNF placement upon discharge: not anticipated   Patient Condition: This patient's medical and functional status has changed since the consult dated: 07-29-14 in which the Rehabilitation Physician determined and documented that the patient's condition is appropriate for intensive rehabilitative care in an inpatient rehabilitation facility. See "History of Present Illness" (above) for medical update. Functional changes are: moderate assistance with mobility and self care skills, demonstrating L sided inattention. Patient's medical and functional status update has been discussed with the Rehabilitation physician and patient remains appropriate for inpatient rehabilitation. Will admit to inpatient rehab today.  Preadmission Screen Completed By:  Nanetta Batty, PT, 08/06/2014 11:46 AM ______________________________________________________________________   Discussed status with Dr. Naaman Plummer on 08-06-14 at 1058 and received telephone approval for admission today.  Admission Coordinator:  Nanetta Batty, PT, time1058/Date 08-06-14

## 2014-08-06 NOTE — Progress Notes (Signed)
UR COMPLETED  

## 2014-08-06 NOTE — Progress Notes (Signed)
Rehab admissions - I have been following pt's case and pt is known to me from prior CIR stay last week. Pt was admitted to CIR on 07-30-14 through 08-05-14.  Pt was transferred from CIR to acute for CEA surgery on 08-05-14. Pt has had CEA and is now medically cleared to return to CIR. I spoke with Terrence Dupont, [place] with vascular sx who confirmed pt's medical clearance. I also called and spoke with Jeani Hawking, RN with  [employer] [place]. We received verbal authorization for inpatient rehab from [place] for pt to return to CIR today.  We have an available rehab bed and will admit to CIR today. I called and updated pt and her dtr. Both are pleased with the plan to return inpatient rehab. I will complete admission paperwork with them shortly.  I called and updated Levada Dy, case Freight forwarder and Dys[place]ka, Education officer, museum. Please call me with any questions. Thanks.  Nanetta Batty, PT Rehabilitation Admissions Coordinator [phone]

## 2014-08-07 ENCOUNTER — Inpatient Hospital Stay (HOSPITAL_COMMUNITY): Payer: BLUE CROSS/BLUE SHIELD | Admitting: Speech Pathology

## 2014-08-07 ENCOUNTER — Inpatient Hospital Stay (HOSPITAL_COMMUNITY): Payer: BLUE CROSS/BLUE SHIELD

## 2014-08-07 ENCOUNTER — Inpatient Hospital Stay (HOSPITAL_COMMUNITY): Payer: BLUE CROSS/BLUE SHIELD | Admitting: Occupational Therapy

## 2014-08-07 ENCOUNTER — Ambulatory Visit: Payer: BLUE CROSS/BLUE SHIELD | Admitting: Family Medicine

## 2014-08-07 DIAGNOSIS — Z9889 Other specified postprocedural states: Secondary | ICD-10-CM

## 2014-08-07 DIAGNOSIS — I10 Essential (primary) hypertension: Secondary | ICD-10-CM

## 2014-08-07 DIAGNOSIS — M6289 Other specified disorders of muscle: Secondary | ICD-10-CM

## 2014-08-07 DIAGNOSIS — Z8673 Personal history of transient ischemic attack (TIA), and cerebral infarction without residual deficits: Secondary | ICD-10-CM

## 2014-08-07 DIAGNOSIS — R414 Neurologic neglect syndrome: Secondary | ICD-10-CM

## 2014-08-07 LAB — COMPREHENSIVE METABOLIC PANEL
ALT: 24 U/L (ref 14–54)
AST: 21 U/L (ref 15–41)
Albumin: 3 g/dL — ABNORMAL LOW (ref 3.5–5.0)
Alkaline Phosphatase: 66 U/L (ref 38–126)
Anion gap: 8 (ref 5–15)
BILIRUBIN TOTAL: 0.8 mg/dL (ref 0.3–1.2)
BUN: 13 mg/dL (ref 6–20)
CALCIUM: 9.1 mg/dL (ref 8.9–10.3)
CO2: 25 mmol/L (ref 22–32)
CREATININE: 0.81 mg/dL (ref 0.44–1.00)
Chloride: 105 mmol/L (ref 101–111)
GFR calc non Af Amer: >60 mL/min (ref >60)
Glucose, Bld: 150 mg/dL — ABNORMAL HIGH (ref 65–99)
Potassium: 4.3 mmol/L (ref 3.5–5.1)
Sodium: 138 mmol/L (ref 135–145)
Total Protein: 5.6 g/dL — ABNORMAL LOW (ref 6.5–8.1)

## 2014-08-07 LAB — GLUCOSE, CAPILLARY
GLUCOSE-CAPILLARY: 111 mg/dL — AB (ref 65–99)
Glucose-Capillary: 174 mg/dL — ABNORMAL HIGH (ref 65–99)
Glucose-Capillary: 122 mg/dL — ABNORMAL HIGH (ref 65–99)
Glucose-Capillary: 129 mg/dL — ABNORMAL HIGH (ref 65–99)

## 2014-08-07 LAB — CBC WITH DIFFERENTIAL/PLATELET
Basophils Absolute: 0.1 10*3/uL (ref 0.0–0.1)
Basophils Relative: 1 % (ref 0–1)
Eosinophils Absolute: 0.3 10*3/uL (ref 0.0–0.7)
Eosinophils Relative: 2 % (ref 0–5)
HCT: 38.6 % (ref 36.0–46.0)
HEMOGLOBIN: 12.5 g/dL (ref 12.0–15.0)
LYMPHS ABS: 2.3 10*3/uL (ref 0.7–4.0)
Lymphocytes Relative: 19 % (ref 12–46)
MCH: 28.4 pg (ref 26.0–34.0)
MCHC: 32.4 g/dL (ref 30.0–36.0)
MCV: 87.7 fL (ref 78.0–100.0)
MONOS PCT: 8 % (ref 3–12)
Monocytes Absolute: 1 10*3/uL (ref 0.1–1.0)
NEUTROS PCT: 70 % (ref 43–77)
Neutro Abs: 8.3 10*3/uL — ABNORMAL HIGH (ref 1.7–7.7)
Platelets: 226 10*3/uL (ref 150–400)
RBC: 4.4 MIL/uL (ref 3.87–5.11)
RDW: 15.5 % (ref 11.5–15.5)
WBC: 12 10*3/uL — ABNORMAL HIGH (ref 4.0–10.5)

## 2014-08-07 NOTE — Progress Notes (Addendum)
[facility]  Subjective  - feels ok   Objective 157/59 94 98.4 F (36.9 C) (Oral) 19 96%  Intake/Output Summary (Last 24 hours) at 08/07/14 6389 Last data filed at 08/07/14 0800  Gross per 24 hour  Intake    480 ml  Output      0 ml  Net    480 ml   Right neck incision healing, no hematoma Neuro tongue midline left arm still weak at baseline  Assessment/Planning: Overall doing well Continuing with rehab Will follow up with her next week ASA only.  No Plavix Leukocytosis trending down afebrile  Ruta Hinds 08/07/2014 9:22 AM --  Laboratory Lab Results:  Recent Labs  08/06/14 1639 08/07/14 0625  WBC 14.9* 12.0*  HGB 13.6 12.5  HCT 41.7 38.6  PLT 215 226   BMET  Recent Labs  08/06/14 0230 08/06/14 1639 08/07/14 0625  NA 136  --  138  K 4.4  --  4.3  CL 103  --  105  CO2 27  --  25  GLUCOSE 160*  --  150*  BUN 15  --  13  CREATININE 0.81 0.88 0.81  CALCIUM 8.9  --  9.1    COAG Lab Results  Component Value Date   INR 0.98 08/05/2014   INR 0.97 07/24/2014   No results found for: PTT

## 2014-08-07 NOTE — Progress Notes (Signed)
Patient information reviewed and entered into eRehab system by Daiva Nakayama, RN, CRRN, [place] Coordinator.  Information including medical coding and functional independence measure will be reviewed and updated through discharge.  Patient meets criteria for IRF interrupted stay and all documentation from 07/30/14 through 08/05/14 should be reviewed as part of the patients overall IRF stay.

## 2014-08-07 NOTE — Progress Notes (Signed)
64 y.o. right handed female with history of tobacco abuse, hypertension. Patient lives alone independently prior to admission and working full time. Admitted 07/24/2014 after a fall noting left-sided weakness. No head trauma no loss of consciousness. MRI of the brain shows multifocal patchy acute right MCA territory infarcts. Additional 8 mm cortical infarct within the parasagittal cortical gray matter of the anterior right frontal lobe, likely right ACA distribution. MRA of the head with right MCA M2 inferior division occlusion. CT angiogram chest negative except for a 6 mm pulmonary nodule on the right with recommendation follow-up CT the chest in 6-12 months. Elevated troponin 4.17. Echocardiogram with ejection fraction of 70% and grade 1 diastolic dysfunction. Carotid Doppler with left 50-60% and right 80% ICA stenosis. Patient did not receive TPA. Vascular surgery Dr. Oneida Alar consulted in relation to ICA stenosis plan follow-up as an outpatient to proceed with possible CEA. Follow-up cardiology for elevated troponin not felt to be due to ACS per Dr. Burt Knack and no further workup was indicated. Neurology consulted presently on aspirin and Plavix for CVA prophylaxis for 3 months then Plavix alone. Subcutaneous heparin for DVT prophylaxis. Findings of elevated hemoglobin A1c of 8.5 and placed on Glucophage with sliding scale insulin. Dysphagia 2 nectar thick liquid diet with follow per speech therapy. Physica and occupational l therapy evaluations completed with recommendations of physical medicine rehabilitation consult. Patient was admitted for a comprehensive rehabilitation program 07/30/2014. She was requiring mod max assist for mobility and transfers some visual attention issues on the left. Arrangements were made by vascular surgery for right CEA she was discharged from acute care services underwent right carotid surgery 08/05/2014 per Dr. Oneida Alar  Subjective/Complaints:   Objective: Vital Signs: Blood  pressure 157/59, pulse 94, temperature 98.4 F (36.9 C), temperature source Oral, resp. rate 19, height _0  (1.575 m), weight 94.303 kg (207 lb 14.4 oz), SpO2 96 %. Dg Chest Port 1 View  08/05/2014   CLINICAL DATA:  Central line placement.  EXAM: PORTABLE CHEST - 1 VIEW  COMPARISON:  Chest CT 07/24/2014.  Chest radiograph of 05/19/2013  FINDINGS: A left internal jugular line terminates at the confluence of the brachiocephalic veins.  Midline trachea. Cardiomegaly accentuated by AP portable technique. No pleural effusion or pneumothorax. Low lung volumes with resultant pulmonary interstitial prominence. No lobar consolidation.  IMPRESSION: Left internal jugular line terminating at the confluence of the brachiocephalic veins. If low SVC position is desired, consider advancement approximately 5 cm with repeat imaging.  Cardiomegaly and low lung volumes, without pneumothorax or other acute finding.   Electronically Signed   By: Abigail Miyamoto M.D.   On: 08/05/2014 21:03   Results for orders placed or performed during the hospital encounter of 08/06/14 (from the past 72 hour(s))  Glucose, capillary     Status: Abnormal   Collection Time: 08/06/14  4:13 PM  Result Value Ref Range   Glucose-Capillary 136 (H) 70 - 99 mg/dL   Comment 1 Notify RN   CBC     Status: Abnormal   Collection Time: 08/06/14  4:39 PM  Result Value Ref Range   WBC 14.9 (H) 4.0 - 10.5 K/uL   RBC 4.76 3.87 - 5.11 MIL/uL   Hemoglobin 13.6 12.0 - 15.0 g/dL   HCT 41.7 36.0 - 46.0 %   MCV 87.6 78.0 - 100.0 fL   MCH 28.6 26.0 - 34.0 pg   MCHC 32.6 30.0 - 36.0 g/dL   RDW 15.4 11.5 - 15.5 %   Platelets 215  150 - 400 K/uL  Creatinine, serum     Status: None   Collection Time: 08/06/14  4:39 PM  Result Value Ref Range   Creatinine, Ser 0.88 0.44 - 1.00 mg/dL   GFR calc non Af Amer >60 >60 mL/min   GFR calc Af Amer >60 >60 mL/min    Comment: (NOTE) The eGFR has been calculated using the CKD EPI equation. This calculation has not  been validated in all clinical situations. eGFR's persistently <60 mL/min signify possible Chronic Kidney Disease.   Glucose, capillary     Status: Abnormal   Collection Time: 08/06/14  9:13 PM  Result Value Ref Range   Glucose-Capillary 145 (H) 70 - 99 mg/dL      HEENT: Left sided neck incision with gauze dressing no swelling, minimal eccymosis Cardio: RRR and no murmur Resp: CTA B/L and unlabored GI: BS positive and NT, ND Extremity:  Pulses positive and No Edema Skin:   Bruise ecchymosis , left dorsum of hand Neuro: Alert/Oriented, Abnormal Sensory absent LT in LUE, Abnormal Motor 2- Left delt ,3- bi, 2- tri, 3-HF KE, Inattention and Other Left field cut Musc/Skel:  Swelling dorsum LEft hand Gen NAD   Assessment/Plan: 1. Functional deficits secondary to Right MCA infarct with Left hemiparesis, left neglect and anosognsia which require 3+ hours per day of interdisciplinary therapy in a comprehensive inpatient rehab setting. Physiatrist is providing close team supervision and 24 hour management of active medical problems listed below. Physiatrist and rehab team continue to assess barriers to discharge/monitor patient progress toward functional and medical goals. FIM:                   Comprehension Comprehension Mode: Auditory Comprehension: 6-Follows complex conversation/direction: With extra time/assistive device  Expression Expression Mode: Verbal Expression: 5-Expresses complex 90% of the time/cues < 10% of the time  Social Interaction Social Interaction: 6-Interacts appropriately with others with medication or extra time (anti-anxiety, antidepressant).  Problem Solving Problem Solving: 4-Solves basic 75 - 89% of the time/requires cueing 10 - 24% of the time  Memory Memory: 4-Recognizes or recalls 75 - 89% of the time/requires cueing 10 - 24% of the time  Medical Problem List and Plan: 1. Functional deficits secondary to right MCA infarct 2.  DVT  Prophylaxis/Anticoagulation: Subcutaneous Lovenox for DVT prophylaxis. Monitor for any bleeding episodes 3. Pain Management: Percocet as needed. Monitor with increased mobility 4. Right carotid stenosis. Status post carotid endarterectomy 08/05/2014 5. Neuropsych: This patient is capable of making decisions on her own behalf. 6. Skin/Wound Care: Routine skin checks 7. Fluids/Electrolytes/Nutrition: Strict I and O's with follow-up chemistries 8. Dysphagia. Dysphagia #2 sent liquids follow-up speech therapy, adequate po fluids d/c IV 9. Hypertension. Lisinopril 5 mg daily. Monitor with increased mobility 10. Diabetes mellitus. Hemoglobin A1c 8.5. Glucophage 500 mg twice a day. Check blood sugars before meals and at bedtime with diabetic teaching 11. Hypothyroidism. Synthroid 12. Hyperlipidemia. Lipitor 13. IV infiltration left hand. Conservative care 14. 6 mm pulmonary nodule identified on CT and GL chest. Recommendations follow-up CT of the chest in 6-12 months  LOS (Days) 1 A FACE TO FACE EVALUATION WAS PERFORMED  KIRSTEINS,ANDREW E 08/07/2014, 6:45 AM

## 2014-08-07 NOTE — Progress Notes (Signed)
Social Work Assessment and Plan Social Work Assessment and Plan  Patient Details  Name: Carla Hopkins MRN: [id number] Date of Birth: 08-26-1950  Today's Date: 08/07/2014  Problem List:  Patient Active Problem List   Diagnosis Date Noted  . History of right MCA stroke 08/06/2014  . Carotid stenosis 08/05/2014  . Acute right MCA stroke 07/30/2014  . Cerebral infarction due to thrombosis of right middle cerebral artery   . Thyroid activity decreased   . Left-sided weakness   . Stroke   . Essential hypertension   . Carotid artery stenosis   . CVA (cerebral infarction) 07/24/2014  . Hyperlipidemia 07/24/2014  . Diabetes 07/24/2014  . Elevated troponin 07/24/2014  . Tobacco abuse 07/24/2014  . Benign essential HTN 07/23/2014  . Obstructive sleep apnea 07/23/2014  . Hypothyroidism 07/03/2014  . Type II or unspecified type diabetes mellitus without mention of complication, not stated as uncontrolled 09/25/2013  . BMI 38.0-38.9,adult 05/19/2013  . Nicotine addiction 05/19/2013  . Right atrial enlargement 05/19/2013   Past Medical History:  Past Medical History  Diagnosis Date  . Hypothyroidism   . Hypertension    Past Surgical History:  Past Surgical History  Procedure Laterality Date  . Tubal ligation    . Endarterectomy Right 08/05/2014    Procedure: Right Carotid ENDARTERECTOMY ;  Surgeon: Elam Dutch, MD;  Location: [facility];  Service: Vascular;  Laterality: Right;  . Patch angioplasty Right 08/05/2014    Procedure: PATCH ANGIOPLASTY;  Surgeon: Elam Dutch, MD;  Location: [facility];  Service: Vascular;  Laterality: Right;   Social History:  reports that she has been smoking Cigarettes.  She has been smoking about 20.00 packs per day. She does not have any smokeless tobacco history on file. She reports that she drinks alcohol. She reports that she does not use illicit drugs.  Family / Support Systems Marital Status: Divorced Patient Roles: Parent, Other (Comment)  (Employee at [employer]) Children: [name] [place]-daughter  [phone] Other Supports: Faye Ramsay [phone]-cell  Cecille Rubin Mock-neighbor [phone]-cell Anticipated Caregiver: daughter plans to return to assist with pt;s transition home, neighbors can check and assist with home management Ability/Limitations of Caregiver: Daughter will only be here a short time-then retrun to [employer] Caregiver Availability: Intermittent Family Dynamics: Close with daughter but she lives such a far distance away.  She does try to see Mom yearly and offers MOm to visit out in [place].  Social History Preferred language: English Religion: Episcopalian Cultural Background: No issues Education: Some College Read: Yes Write: Yes Employment Status: Employed Name of Employer: [employer] Return to Work Plans: Plans to return if able Freight forwarder Issues: No issues Guardian/Conservator: MD feels pt is capable of making her own decisions while here, but daugther has gotten HC-POA and Durable POA while pt has been here.   Abuse/Neglect Physical Abuse: Denies Verbal Abuse: Denies Sexual Abuse: Denies Exploitation of patient/patient's resources: Denies Self-Neglect: Denies  Emotional Status Pt's affect, behavior adn adjustment status: Pt is motivated to improve and is hopeful she will reach independent again.  She is glad the CEA is iover and can now focus on her recovery and worry less about having another stroke. Will see how she progresses, but worry about her lack of awareness with her affected side. Recent Psychosocial Issues: Thought her health issues were managed until this stroke. Pyschiatric History: None-feel would benefit from seeing neuro-psych while here due to deficits and question her ability to reach mod/i levle while here.  She is coping appropriately at  this time and motivated to attend therapies. Substance Abuse History: Tobacco-realizes she needs to quit and plans too.  Can connect her  with cessation counselor  Patient / Family Perceptions, Expectations & Goals Pt/Family understanding of illness & functional limitations: Both pt and daughter have a good understanding of her stroke and deficits.  She is not always awar eof her affected side and has some cognitive issues.  She and daughter have spoken to the MD who rounds daily and feel their questions are being addressed. 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, employee, home owner, neighbor, friend, etc Anticipated changes in roles/activities/participation: resume Pt/family expectations/goals: Pt states: " I want to recover and be independent again, I need to go back to work."  Daughter states: " I hope she progresses and can return home, I have to go back to [place]."  [employer]: None Premorbid Home Care/DME Agencies: None Transportation available at discharge: Friends Resource referrals recommended: Neuropsychology, Support group (specify)  Discharge Planning Living Arrangements: Alone Support Systems: Children, Friends/neighbors Type of Residence: Private residence Administrator, sports: Multimedia programmer (specify) Nurse, mental health) Financial Resources: Employment Financial Screen Referred: No Living Expenses: Own Money Management: Patient Does the patient have any problems obtaining your medications?: No Home Management: Patient Patient/Family Preliminary Plans: return home with daughter coming back for a short time and then her neighbors providing intermittent assist.  Pt needs ot be mod/i level to return home safely, since she does not have a caregiver.  Will see what team feels goals wise and come up with a safe discharge plan Social Work Anticipated Follow Up Needs: HH/OP, Support Group  Clinical Impression Very pleasant motivated female who is willing to work hard and recover from this stroke. Question if pt will reach mod/i level with her awareness and cognition deficits.  Daughter  plans to come back for a few days only and this may Not be enough time for pt. Will need to come up with a safe discharge plan for home based on the team's goals. Provide support and discharge plan.  Elease Hashimoto 08/07/2014, 1:00 PM

## 2014-08-07 NOTE — Progress Notes (Signed)
[facility] Admission Coordinator Signed Physical Medicine and Rehabilitation PMR Pre-admission 08/06/2014 10:21 AM  Related encounter: Admission (Discharged) from 08/05/2014 in [facility] Collapse All   PMR Admission Coordinator Pre-Admission Assessment  Patient: Carla Hopkins is an 64 y.o., female MRN: [id number] DOB: 10-14-1950 Height: '5\' 2"'  (157.5 cm) Weight: 92.7 kg (204 lb 5.9 oz)   Insurance Information HMO: PPO: PCP: IPA: 80/20: OTHER: Blue Options PRIMARY: BCBS of [place] Policy#: [id number] Subscriber: self CM Name: Delight Stare, RN Phone#: [phone], ext. [phone] Fax#: [phone] Verbal approval given on 08-06-14 for seven days from 08-06-14 through 08-13-14 with updates due to [facility] on 08-13-14 at above fax  Pre-Cert#: [id number] Employer: [employer] Benefits: Phone #: [phone] Name: per [place]. Date: 05-27-14 Deduct: $2000 (met $325) Out of Pocket Max: $5350 (met $325) Life Max: none CIR: 80/20%, pre-auth needed SNF: 80/20% (120 days visit limits) Outpatient: 80/20% Co-Pay: no copay, no visit limits Home Health: 80/20% Co-Pay: none, 100 visit limits DME: 80/20% Co-Pay: none Providers: in network  Emergency Contact Information Contact Information    Name [place] Daughter [place] [phone]  [phone]   [facility] Neighbor [phone]  [phone]   [place] [phone]  [phone]     Current Medical History  Patient Admitting Diagnosis: Multifocal patchy acute right MCA territory infarct, now s/p right CEA  History of Present Illness: Carla Hopkins is a 64  y.o. right handed female with history of tobacco abuse, hypertension. Patient lives alone independently prior to admission and working full time. Admitted 07/24/2014 after a fall noting left-sided weakness. No head trauma no loss of consciousness. MRI of the brain shows multifocal patchy acute right MCA territory infarcts. Additional 8 mm cortical infarct within the parasagittal cortical gray matter of the anterior right frontal lobe, likely right ACA distribution. MRA of the head with right MCA M2 inferior division occlusion. CT angiogram chest negative except for a 6 mm pulmonary nodule on the right with recommendation follow-up CT the chest in 6-12 months. Elevated troponin 4.17. Echocardiogram with ejection fraction of 70% and grade 1 diastolic dysfunction. Carotid Doppler with left 50-60% and right 80% ICA stenosis. Patient did not receive TPA. Vascular surgery Dr. Oneida Alar consulted in relation to ICA stenosis plan follow-up as an outpatient to proceed with possible CEA. Follow-up cardiology for elevated troponin not felt to be due to ACS per Dr. Burt Knack and no further workup was indicated. Neurology consulted presently on aspirin and Plavix for CVA prophylaxis for 3 months then Plavix alone. Subcutaneous heparin for DVT prophylaxis. Findings of elevated hemoglobin A1c of 8.5 and placed on Glucophage with sliding scale insulin. Dysphagia 2 nectar thick liquid diet with follow per speech therapy. Physica and occupational therapy evaluations completed with recommendations of physical medicine rehabilitation consult. Patient was admitted for a comprehensive rehabilitation program 07/30/2014. She was requiring mod max assist for mobility and transfers some visual attention issues on the left. Arrangements were made by vascular surgery for right CEA she was discharged from acute care services underwent right carotid surgery 08/05/2014 per Dr. Oneida Alar. She tolerated the procedure well. Her Plavix was discontinued  currently maintained on aspirin 81 mg daily. She remains on Lovenox for DVT prophylaxis. Recent infiltration of left hand from IV that has been monitored closely with some increased swelling and bruising. She is to be admitted back to inpatient rehabilitation services 08/06/2014.  Past Medical History  Past Medical History  Diagnosis Date  . Hypothyroidism   . Hypertension     Family History  family history is not on file. She was adopted.  Prior Rehab/Hospitalizations: none  Current Medications   Current facility-administered medications:  . 0.9 % sodium chloride infusion, , Intravenous, Continuous, Ulyses Amor, PA-C, Stopped at 08/06/14 0600 . acetaminophen (TYLENOL) tablet 325-650 mg, 325-650 mg, Oral, Q4H PRN **OR** acetaminophen (TYLENOL) suppository 325-650 mg, 325-650 mg, Rectal, Q4H PRN, Ulyses Amor, PA-C . alum & mag hydroxide-simeth (MAALOX/MYLANTA) 200-200-20 MG/5ML suspension 15-30 mL, 15-30 mL, Oral, Q2H PRN, Ulyses Amor, PA-C . aspirin chewable tablet 81 mg, 81 mg, Oral, Daily, Ulyses Amor, PA-C, 81 mg at 08/06/14 1014 . atorvastatin (LIPITOR) tablet 80 mg, 80 mg, Oral, q1800, Ulyses Amor, PA-C . bisacodyl (DULCOLAX) suppository 10 mg, 10 mg, Rectal, Daily PRN, Ulyses Amor, PA-C . cefUROXime (ZINACEF) 1.5 g in dextrose 5 % 50 mL IVPB, 1.5 g, Intravenous, Q12H, Emma M Collins, PA-C, 1.5 g at 08/06/14 0300 . docusate sodium (COLACE) capsule 100 mg, 100 mg, Oral, Daily, Ulyses Amor, PA-C, 100 mg at 08/06/14 1013 . enoxaparin (LOVENOX) injection 30 mg, 30 mg, Subcutaneous, Q24H, Emma M Collins, PA-C, 30 mg at 08/06/14 1015 . guaiFENesin-dextromethorphan (ROBITUSSIN DM) 100-10 MG/5ML syrup 15 mL, 15 mL, Oral, Q4H PRN, Ulyses Amor, PA-C . hydrALAZINE (APRESOLINE) injection 5 mg, 5 mg, Intravenous, Q20 Min PRN, Ulyses Amor, PA-C . ibuprofen (ADVIL,MOTRIN) tablet 400 mg, 400 mg, Oral, Q6H PRN, Ulyses Amor, PA-C .  insulin aspart (novoLOG) injection 0-9 Units, 0-9 Units, Subcutaneous, TID WC, Ulyses Amor, PA-C, 1 Units at 08/06/14 1013 . labetalol (NORMODYNE,TRANDATE) injection 10 mg, 10 mg, Intravenous, Q10 min PRN, Ulyses Amor, PA-C . levothyroxine (SYNTHROID, LEVOTHROID) tablet 88 mcg, 88 mcg, Oral, QAC breakfast, Ulyses Amor, PA-C, 88 mcg at 08/06/14 1013 . lisinopril (PRINIVIL,ZESTRIL) tablet 5 mg, 5 mg, Oral, Daily, Ulyses Amor, PA-C, 5 mg at 08/05/14 2200 . magnesium sulfate IVPB 2 g 50 mL, 2 g, Intravenous, Daily PRN, Ulyses Amor, PA-C . metFORMIN (GLUCOPHAGE) tablet 500 mg, 500 mg, Oral, BID WC, Ulyses Amor, PA-C, 500 mg at 08/06/14 1014 . metoprolol (LOPRESSOR) injection 2-5 mg, 2-5 mg, Intravenous, Q2H PRN, Ulyses Amor, PA-C . morphine 2 MG/ML injection 2-5 mg, 2-5 mg, Intravenous, Q1H PRN, Ulyses Amor, PA-C, 2 mg at 08/05/14 2218 . ondansetron [facility]) injection 4 mg, 4 mg, Intravenous, Q6H PRN, Ulyses Amor, PA-C, 4 mg at 08/05/14 2111 . oxyCODONE-acetaminophen (PERCOCET/ROXICET) 5-325 MG per tablet 1-2 tablet, 1-2 tablet, Oral, Q4H PRN, Ulyses Amor, PA-C, 1 tablet at 08/06/14 0719 . pantoprazole (PROTONIX) EC tablet 40 mg, 40 mg, Oral, Daily, Ulyses Amor, PA-C, 40 mg at 08/06/14 1014 . phenol (CHLORASEPTIC) mouth spray 1 spray, 1 spray, Mouth/Throat, PRN, Ulyses Amor, PA-C . potassium chloride SA (K-DUR,KLOR-CON) CR tablet 20-40 mEq, 20-40 mEq, Oral, Daily PRN, Ulyses Amor, PA-C . senna-docusate (Senokot-S) tablet 1 tablet, 1 tablet, Oral, QHS PRN, Ulyses Amor, PA-C . 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 She enjoys her four cats and has very supportive neighbors. Her dtr is also very supportive and lives in [place].   Home Assistive Devices / Equipment Home Assistive Devices/Equipment: Eyeglasses, CPAP Home Equipment: None  Prior Functional Level Prior Function Level of Independence: Independent Comments: works for [employer]  Current Functional Level Cognition  Orientation Level: Oriented X4   Extremity Assessment (includes Sensation/Coordination)  LUE Deficits / Details: Brunstrum 0 forearm and distally; Brunstrum 1-elbow and proximally LUE Sensation: decreased light touch, decreased proprioception (can feel pain (pinch), but still not as strong as is on right arm--and pt is aware of this) LUE Coordination: decreased fine motor, decreased gross motor  RLE Deficits / Details: AROM WFL, strength hip flexion 4-/5 due to pain, knee extension 4/5, ankle DF 4+/5 LLE Deficits / Details: AAROM WFL, strength hip flexion 3/5, knee extension 4-/5 ankle DF 3+/5    ADLs    See OT evaluation on 07-31-14 while on CIR. Pt is overall moderate assistance with ADL tasks and has L UE inattention and sensory issues.   Mobility    See PT evaluation completed on 07-31-14 while on CIR. Pt is overall minimal to moderate assistance with mobility and needs moderate to maximal assistance for L attention during all functional activities.    Transfers   Minimal to moderate assist with stand pivot transfers with L sided inattention.    Ambulation / Gait / Stairs / Wheelchair Mobility  Ambulation/Gait General Gait Details: pt with noted L sided neglect, L LE decreased DF, decreased step height/dragging foot, L knee buckling/limp. pt with no proprioceptive input to self correct. max v/c's to make L knee straight and pick up foot to clear floor. PT provided tactile cueing at  buttocks to promote stability to allow for better L LE gait kinematics while rehab tech provide physical support on R side. pt unable to use RW due to inability to grip with L hand. pt also impulsive requiring max v/c's to slow down to normal pace Gait velocity: impulsively quick    Posture / Balance Static Sitting Balance Static Sitting - Level of Assistance: 5: Stand by assistance Dynamic Sitting Balance Dynamic Sitting - Level of Assistance: 5: Stand by assistance Sitting balance - Comments: accepts challenges, but reaching to left minguard for safety when outside base of support Static Standing Balance Static Standing - Level of Assistance: 4: Min assist Dynamic Standing Balance Dynamic Standing - Level of Assistance: 4: Min assist, 3: Mod assist (depends on L inattention) Balance Sitting balance - Comments: accepts challenges, but reaching to left minguard for safety when outside base of support    Special needs/care consideration BiPAP/CPAP - yes, uses CPAP at home  CPM no Continuous Drip IV no  Dialysis no  Life Vest no  Oxygen no  Special Bed no  Trach Size no  Wound Vac (area) no  Skin - pt has a rash on bilateral heels and bruise on L foot per pt report Bowel mgmt: last BM on 08-04-14 Bladder mgmt: currently using bedpan  Diabetic mgmt - newly diabetic, using insulin    Previous Home Environment Living Arrangements: Alone Lives With: Alone Available Help at Discharge: Friend(s), Available PRN/intermittently Type of Home: House Home Layout: One level Home Access: Stairs to enter Entrance Stairs-Rails: None (pt stated someone is going to build her a ramp with bil rails) Entrance Stairs-Number of Steps: 3 Bathroom Shower/Tub: Tub/shower unit, Architectural technologist: Standard Home Care Services: No  Discharge Living Setting Plans  for Discharge Living Setting: Patient's home, Alone Type of Home at Discharge:  House Discharge Home Layout: One level Discharge Home Access: Stairs to enter Entrance Stairs-Rails: (hand rail is being built per pt) Entrance Stairs-Number of Steps: 3 Does the patient have any problems obtaining your medications?: No  Social/Family/Support Systems Patient Roles: Parent, Other (Comment) (employee-[employer]) Contact Information: dtr [name] is primary contact Anticipated Caregiver: daughter hopes to return for a few days upon discharge. Neighbors can check on her Ability/Limitations of Caregiver: See above-daughter to hopefully come back for a few days upon discharge Caregiver Availability: Intermittent Discharge Plan Discussed with Primary Caregiver: Yes Is Caregiver In Agreement with Plan?: Yes Does Caregiver/Family have Issues with Lodging/Transportation while Pt is in Rehab?: No  Goals/Additional Needs Patient/Family Goal for Rehab: Mod Ind and Supervision with PT/OT; Mod Ind with SLP Expected length of stay: 15-18 days (Pt was at CIR from 5-4 through 5-10, then had CEA sx.) Cultural Considerations: none Dietary Needs: heart healthy, carb modified Equipment Needs: to be determined Pt/Family Agrees to Admission and willing to participate: Yes (spoke with pt and her dtr) Program Orientation Provided & Reviewed with Pt/Caregiver Including Roles & Responsibilities: Yes   Decrease burden of Care through IP rehab admission: NA   Possible need for SNF placement upon discharge: not anticipated   Patient Condition: This patient's medical and functional status has changed since the consult dated: 07-29-14 in which the Rehabilitation Physician determined and documented that the patient's condition is appropriate for intensive rehabilitative care in an inpatient rehabilitation facility. See "History of Present Illness" (above) for medical update. Functional changes are: moderate assistance with mobility and self care skills, demonstrating L sided inattention. Patient's medical  and functional status update has been discussed with the Rehabilitation physician and patient remains appropriate for inpatient rehabilitation. Will admit to inpatient rehab today.  Preadmission Screen Completed By: Nanetta Batty, PT, 08/06/2014 11:46 AM ______________________________________________________________________  Discussed status with Dr. Naaman Plummer on 08-06-14 at 1058 and received telephone approval for admission today.  Admission Coordinator: Nanetta Batty, PT, time1058/Date 08-06-14          Cosigned by: Meredith Staggers, MD at 08/06/2014 12:19 PM  Revision History     Date/Time User Provider Type Action   08/06/2014 12:19 PM Meredith Staggers, MD Physician Cosign   08/06/2014 11:46 AM [facility] Admission Coordinator Sign   08/06/2014 11:44 AM [facility] Admission Coordinator Sign   08/06/2014 11:02 AM [facility] Admission Coordinator Sign   View Details Report

## 2014-08-07 NOTE — Progress Notes (Signed)
Physical Therapy Weekly Progress Note  Patient Details  Name: Carla Hopkins MRN: [id number] Date of Birth: [birth year]  Beginning of progress report period: 07/31/10 End of progress report period: 08/07/14  Today's Date: 08/07/2014 PT Individual Time: 0800-0900 PT Individual Time Calculation (min): 60 min   Patient has met 2 of 4 short term goals.  L w/c brake goal not met due to pt's tender, swollen and bruised L hand due to IV infiltrate; gait distance not met due to pt's lack of activity tolerance at present.  Patient continues to demonstrate the following deficits: awareness, sensory deficits, strength, balance, activity tolerance,  and therefore will continue to benefit from skilled PT intervention to enhance overall performance with activity tolerance, balance, postural control, ability to compensate for deficits, functional use of  left upper extremity and left lower extremity, attention and awareness.  Patient progressing toward long term goals.  Continue plan of care.  PT Short Term Goals Week 1:  PT Short Term Goal 1 - Progress (Week 1): pt will perform w/c>< bed transfer with min assist consistently ; Met PT Short Term Goal 2 - Progress (Week 1): pt will lock L w/c brake using L hand with 1 cue;  Not met PT Short Term Goal 3 - Progress (Week 1): pt will perform gait x 50' with assistance of 1 person;  Partly met ;Progressing toward goal PT Short Term Goal 4 - Progress (Week 1): Met  Skilled Therapeutic Interventions/Progress Updates:   Pt had an interrupted stay in CIR.  She returned to unit after R endarertectomy without complications.  Please refer to PT evaluation dated 07/31/14.  Pt. was tearful at times due to adjustment to the idea that she will not be able to go home alone at d/c.  Pt stated that her dtr plans to stay with her at d/c; duration unknown.  Brief assessment of sensation, strength, balance demonstrates no change in function.  Pt continues to demonstrate marked L  inattention.  Pt stood x 2 minutes during assisted dressing in room in preparation for leaving room.   W/c propulsion with cushion removed, hemi technique with RUE and RLE, x 100' with supervision for scanning to L.  neuromuscular re-education via demo, manual assist, auditory and VCs for: unsupported sitting facilitating trunk lengthening/shortening/rotating while reaching within BOS, use of L hand for gross grasp assist, gait on level tile without AD, and up/down steps using step to and step through techniques.  Gait x 40' with mod/max assist for balance due to L hip instability, L inattention to L foot placement, unsafe speed, and L inattention to environment.  Up/down 8 (3") steps, and 4 (7") steps with min assist for L foot placment, max cues for techniques. Step through on 3" high steps, step to on 7" high steps.   W/c>< mat with min assist for stand pivot.  Pt fatigued after gait x 40' and stairs.  Pt returned to room; resting in w/c with all needs in place.    Therapy Documentation Precautions:  Precautions Precautions: Fall Precaution Comments: severe L sided inattention Restrictions Weight Bearing Restrictions: No   Pain: Pain Assessment Pain Assessment: Faces Pain Score: 5 Faces Pain Scale: Hurts a little bit Pain Type: Acute pain Pain Location: Hand Pain Orientation: Left Pain Descriptors / Indicators: Aching;Constant;Tender;Throbbing Pain Onset: With Activity Patients Stated Pain Goal: 2 Pain Intervention(s): Medication (See eMAR);Repositioned Multiple Pain Sites: No       See FIM for current functional status  Therapy/Group: Individual Therapy  Paysen Goza,[place]  08/07/2014, 4:48 PM

## 2014-08-07 NOTE — Progress Notes (Signed)
Speech Language Pathology Daily Session Note  Patient Details  Name: Carla Hopkins MRN: [id number] Date of Birth: 08-02-1950  Today's Date: 08/07/2014 SLP Individual Time: 0915-1000 SLP Individual Time Calculation (min): 45 min  Short Term Goals: Week 1: SLP Short Term Goal 1 (Week 1): Pt will attend to left body/environment during semi-complex functional tasks with min assist verbal cues SLP Short Term Goal 2 (Week 1): Pt will complete semi-complex tasks with min assist verbal cues for functional problem solving.   SLP Short Term Goal 3 (Week 1): Pt will return demonstration of oral motor strengthening exercises targeting left sided weakness with supervision.  SLP Short Term Goal 4 (Week 1): Pt will utilize slow rate and overarticulation to achieve intelligibility in conversations with supervision.  SLP Short Term Goal 5 (Week 1): Pt will consume regular textures and thin liquids with mod I self monitoring and correction of left sided buccal residue.    Skilled Therapeutic Interventions:  Pt was seen for skilled ST treatment session in addition to BSE follow re-admission after interrupted stay.  See separate BSE report. Treatment targeted education and use of dysarthria strategies.  SLP provided with with 5 OMEs to complete targeting left sided labial weakness.  Pt returned demonstration of exercises with min instructional cues.  SLP also provided skilled education regarding use of dysarthria strategies to achieve articulatory precision in conversations.  Pt utilized the abovementioned strategies with supervision cues during a structured categorical naming task.  Pt returned to room and left upright in wheelchair, all needs left within reach.  Continue per current plan of care.    FIM:  Comprehension Comprehension Mode: Auditory Comprehension: 6-Follows complex conversation/direction: With extra time/assistive device Expression Expression Mode: Verbal Expression: 5-Expresses complex 90% of  the time/cues < 10% of the time Social Interaction Social Interaction: 6-Interacts appropriately with others with medication or extra time (anti-anxiety, antidepressant). Problem Solving Problem Solving: 4-Solves basic 75 - 89% of the time/requires cueing 10 - 24% of the time Memory Memory: 4-Recognizes or recalls 75 - 89% of the time/requires cueing 10 - 24% of the time FIM - Eating Eating Activity: 5: Set-up assist for open containers  Pain Pain Assessment Pain Assessment: No/denies pain   Therapy/Group: Individual Therapy  Tacia Hindley, Selinda Orion 08/07/2014, 1:27 PM

## 2014-08-07 NOTE — Progress Notes (Signed)
Occupational Therapy Session Note  Patient Details  Name: Carla Hopkins MRN: [id number] Date of Birth: 12-26-1950  Today's Date: 08/07/2014 OT Individual Time: 1330-1403 OT Individual Time Calculation (min): 33 min    Skilled Therapeutic Interventions/Progress Updates:    Pt worked on Research scientist (life sciences) for the LUE during session.  She was able to transfer stand pivot to the therapy mat with min assist.  Utilized UE Ranger to focus on  LUE movement.  Applied velcro strap over fingers as pt with increased swelling and hematoma on the left hand.  Focused on active arm movements with shoulder flexion, internal and external rotation, and elbow extension.  Pt with stronger movements in internal rotation at this time.  Emphasis on balancing the device without letting it fall into internal rotation.  Pt needs mod assist to perform this.  In addition, focused on maintaining selective visual attention to the left arm when attempting movement.  Pt externally distracted by noises in the gym.  Returned to room at end of session via wheelchair.  Pt set up for lunch tray with nursing notified as she currently needs intermittent supervision.    Therapy Documentation Precautions:  Precautions Precautions: Fall Precaution Comments: severe L sided inattention Restrictions Weight Bearing Restrictions: No  Pain: Pain Assessment Pain Assessment: Faces Pain Score: 8  Faces Pain Scale: Hurts a little bit Pain Type: Acute pain Pain Location: Hand Pain Orientation: Left Pain Descriptors / Indicators: Aching;Constant;Tender;Throbbing Pain Onset: With Activity Patients Stated Pain Goal: 2 Pain Intervention(s): Medication (See eMAR);Repositioned Multiple Pain Sites: No ADL: See FIM for current functional status  Therapy/Group: Individual Therapy  Gladis Soley OTR/L 08/07/2014, 3:43 PM

## 2014-08-07 NOTE — Care Management Note (Signed)
[facility] Individual Statement of Services  Patient Name:  Carla Hopkins  Date:  08/07/2014  Welcome to the [facility].  Our goal is to provide you with an individualized program based on your diagnosis and situation, designed to meet your specific needs.  With this comprehensive rehabilitation program, you will be expected to participate in at least 3 hours of rehabilitation therapies Monday-Friday, with modified therapy programming on the weekends.  Your rehabilitation program will include the following services:  Physical Therapy (PT), Occupational Therapy (OT), Speech Therapy (ST), 24 hour per day rehabilitation nursing, Therapeutic Recreaction (TR), Neuropsychology, Case Management (Social Worker), Rehabilitation Medicine, Nutrition Services and Pharmacy Services  Weekly team conferences will be held on Wednesday to discuss your progress.  Your Social Worker will talk with you frequently to get your input and to update you on team discussions.  Team conferences with you and your family in attendance may also be held.  Expected length of stay: 14-21 days  Overall anticipated outcome: supervision level  Depending on your progress and recovery, your program may change. Your Social Worker will coordinate services and will keep you informed of any changes. Your Social Worker's name and contact numbers are listed  below.  The following services may also be recommended but are not provided by the [facility] will be made to provide these services after discharge if needed.  Arrangements include referral to agencies that provide these services.  Your insurance has been verified to be:  [place] Your primary doctor is:  Ellsworth Lennox  Pertinent information will be shared with your doctor and  your insurance company.  Social Worker:  Ovidio Kin, [place] or [phone]  Information discussed with and copy given to patient by: Elease Hashimoto, 08/07/2014, 9:33 AM

## 2014-08-07 NOTE — Progress Notes (Signed)
Carla Staggers, Carla Hopkins Physician Signed Physical Medicine and Rehabilitation Consult Note 07/28/2014 12:04 PM  Related encounter: ED to Hosp-Admission (Discharged) from 07/24/2014 in [facility] Collapse All        Physical Medicine and Rehabilitation Consult Reason for Consult: Multifocal patchy acute right MCA territory infarct Referring Physician: Internal medicine   HPI: Carla Hopkins is a 64 y.o. right handed female with history of tobacco abuse, hypertension. Patient lives alone independently prior to admission and working full time. Admitted 07/24/2014 after a fall noting left-sided weakness. No head trauma no loss of consciousness. MRI of the brain shows multifocal patchy acute right MCA territory infarcts. Additional 8 mm cortical infarct within the parasagittal cortical gray matter of the anterior right frontal lobe, likely right ACA distribution. MRA of the head with right MCA M2 inferior division occlusion. CT angiogram chest negative except for a 6 mm pulmonary nodule on the right with recommendation follow-up CT the chest in 6-12 months. Echocardiogram with ejection fraction of 70% and grade 1 diastolic dysfunction. Carotid Doppler with left 50-60% and right 80% ICA stenosis. Patient did not receive TPA. Vascular surgery Dr. Oneida Alar consulted in relation to ICA stenosis plan follow-up as an outpatient to proceed with possible CEA Neurology consulted presently on aspirin and Plavix for CVA prophylaxis. Subcutaneous heparin for DVT prophylaxis. Dysphagia 2 nectar thick liquid diet. Physical therapy evaluation completed with recommendations of physical medicine rehabilitation consult.   Review of Systems  Respiratory: Positive for cough.   Occasional shortness of breath on exertion  All other systems reviewed and are negative.  Past Medical History  Diagnosis Date  . Hypothyroidism   . Hypertension    Past Surgical  History  Procedure Laterality Date  . Tubal ligation     Family History  Problem Relation Age of Onset  . Adopted: Yes   Social History:  reports that she has been smoking Cigarettes. She has been smoking about 20.00 packs per day. She does not have any smokeless tobacco history on file. She reports that she drinks alcohol. She reports that she does not use illicit drugs. Allergies: No Known Allergies Medications Prior to Admission  Medication Sig Dispense Refill  . ergocalciferol (DRISDOL) 50000 UNITS capsule Take 1 capsule (50,000 Units total) by mouth once a week. 12 capsule 1  . ibuprofen (ADVIL,MOTRIN) 200 MG tablet Take 400 mg by mouth every 6 (six) hours as needed.    Marland Kitchen levothyroxine (SYNTHROID, LEVOTHROID) 88 MCG tablet Take 1 tablet (88 mcg total) by mouth daily. 90 tablet 1  . lisinopril-hydrochlorothiazide (ZESTORETIC) 20-12.5 MG per tablet Take 1 tablet by mouth daily. 90 tablet 1  . albuterol (PROVENTIL HFA;VENTOLIN HFA) 108 (90 BASE) MCG/ACT inhaler Inhale 2 puffs into the lungs every 4 (four) hours as needed for wheezing or shortness of breath (cough, shortness of breath or wheezing.). (Patient not taking: Reported on 07/03/2014) 1 Inhaler 12  . metoprolol succinate (TOPROL-XL) 25 MG 24 hr tablet Take 1/2 - 1 tablet by mouth once a day. 30 tablet 3    Home: Home Living Family/patient expects to be discharged to:: Inpatient rehab Living Arrangements: Alone (daughter lives in [place]) Type of Home: House Home Access: Stairs to enter Technical brewer of Steps: 3 Entrance Stairs-Rails: None Home Layout: One level Home Equipment: None Lives With: Alone  Functional History: Prior Function Level of Independence: Independent Comments: works for [employer] Functional Status:  Mobility: Bed Mobility Overal bed  mobility: Needs Assistance Bed Mobility: Supine to Sit Supine to sit: Mod assist, HOB elevated General  bed mobility comments: Pt up in recliner upon my arrival Transfers Overall transfer level: Needs assistance Equipment used: (1 person Bil forearm A in front of her) Transfers: Sit to/from Stand Sit to Stand: Min assist Stand pivot transfers: Min assist General transfer comment: cues for technique, to slow down and for proper positioning in chair prior to sitting       ADL: ADL Overall ADL's : Needs assistance/impaired Eating/Feeding: Set up, Supervision/ safety (swallowing precautions) Grooming: Moderate assistance, Sitting Upper Body Bathing: Moderate assistance, Sitting Lower Body Bathing: Maximal assistance (with Min A sit<>stand, but Mod A for dynamic balance) Upper Body Dressing : Moderate assistance, Sitting Lower Body Dressing: Total assistance (with Min A sit<>stand, but Mod A for dynamic balance) Toilet Transfer: Moderate assistance, Ambulation (Bil HHA with me infront of her holding onto gait belt with both UEs; recliner >bed>back to recliner with VC and tactile cues for shifting weight) Toileting- Clothing Manipulation and Hygiene: Maximal assistance (with Min A sit<>stand, but Mod A for dynamic balance)  Cognition: Cognition Overall Cognitive Status: Impaired/Different from baseline Arousal/Alertness: Awake/alert Orientation Level: Oriented X4 Attention: Sustained Sustained Attention: Appears intact Memory: Appears intact Awareness: Appears intact Problem Solving: Appears intact (see clinical impression statement) Safety/Judgment: Appears intact Cognition Arousal/Alertness: Awake/alert Behavior During Therapy: Impulsive (when getting ready to stand up and work on ambulation) Overall Cognitive Status: Impaired/Different from baseline Area of Impairment: Attention, Safety/judgement, Awareness, Problem solving Current Attention Level: Selective Safety/Judgement: Decreased awareness of safety (decreased awareness of how her deficits affect her safety) Awareness:  Emergent Problem Solving: Difficulty sequencing, Requires verbal cues, Requires tactile cues (for shifting weight when taking steps, especially for LLE) General Comments: decreasd left side awareness/attention  Blood pressure 141/108, pulse 78, temperature 97.9 F (36.6 C), temperature source Oral, resp. rate 20, height 5' 2.5" (1.588 m), weight 95.4 kg (210 lb 5.1 oz), SpO2 95 %. Physical Exam  Vitals reviewed. Constitutional: She is oriented to person, place, and time.  HENT:  Head: Normocephalic.  Eyes: EOM are normal.  Neck: Normal range of motion. Neck supple. No thyromegaly present.  Cardiovascular: Normal rate and regular rhythm.  Respiratory: Effort normal and breath sounds normal. No respiratory distress.  GI: Soft. Bowel sounds are normal. She exhibits no distension.  Neurological: She is alert and oriented to person, place, and time.  Follows commands. ?mild left inattention, left facial droop. Speech dysarthric. Good insight and awareness. LUE: 1+ to 2- delt,bic,ticep, 0/5 hi,wrist. LLE: 3+ to 4/5 prox to disatl. Sensation 1/2 LUE--can sense general pain. LLE with minimal sensory deficits  Skin: Skin is warm and dry.  Psychiatric: She has a normal mood and affect. Her behavior is normal.     Lab Results Last 24 Hours    Results for orders placed or performed during the hospital encounter of 07/24/14 (from the past 24 hour(s))  Glucose, capillary Status: Abnormal   Collection Time: 07/27/14 4:14 PM  Result Value Ref Range   Glucose-Capillary 286 (H) 70 - 99 mg/dL  Glucose, capillary Status: Abnormal   Collection Time: 07/27/14 9:12 PM  Result Value Ref Range   Glucose-Capillary 111 (H) 70 - 99 mg/dL  Glucose, capillary Status: Abnormal   Collection Time: 07/28/14 6:24 AM  Result Value Ref Range   Glucose-Capillary 177 (H) 70 - 99 mg/dL   Comment 1 Repeat Test    Comment 2 Document in Chart   Glucose,  capillary  Status: Abnormal   Collection Time: 07/28/14 11:34 AM  Result Value Ref Range   Glucose-Capillary 183 (H) 70 - 99 mg/dL   Comment 1 Notify RN    Comment 2 Document in Chart       Imaging Results (Last 48 hours)    No results found.    Assessment/Plan: Diagnosis: right MCA infarct 1. Does the need for close, 24 hr/day medical supervision in concert with the patient's rehab needs make it unreasonable for this patient to be served in a less intensive setting? Yes 2. Co-Morbidities requiring supervision/potential complications: dm, htn,  3. Due to bladder management, bowel management, safety, skin/wound care, disease management, medication administration, pain management and patient education, does the patient require 24 hr/day rehab nursing? Yes 4. Does the patient require coordinated care of a physician, rehab nurse, PT (1-2 hrs/day, 5 days/week), OT (1-2 hrs/day, 5 days/week) and SLP (1-2 hrs/day, 5 days/week) to address physical and functional deficits in the context of the above medical diagnosis(es)? Yes Addressing deficits in the following areas: balance, endurance, locomotion, strength, transferring, bowel/bladder control, bathing, dressing, feeding, grooming, toileting, cognition, speech, swallowing and psychosocial support 5. Can the patient actively participate in an intensive therapy program of at least 3 hrs of therapy per day at least 5 days per week? Yes 6. The potential for patient to make measurable gains while on inpatient rehab is excellent 7. Anticipated functional outcomes upon discharge from inpatient rehab are modified independent and supervision with PT, modified independent and supervision with OT, modified independent with SLP. 8. Estimated rehab length of stay to reach the above functional goals is: 15-21 days 9. Does the patient have adequate social supports and living environment to accommodate these discharge functional goals?  Yes 10. Anticipated D/C setting: Home 11. Anticipated post D/C treatments: HH therapy and Outpatient therapy 12. Overall Rehab/Functional Prognosis: excellent  RECOMMENDATIONS: This patient's condition is appropriate for continued rehabilitative care in the following setting: CIR Patient has agreed to participate in recommended program. Yes Note that insurance prior authorization may be required for reimbursement for recommended care.  Comment: Rehab Admissions Coordinator to follow up.  Thanks,  Carla Staggers, Carla Hopkins, Mellody Drown     07/28/2014       Revision History     Date/Time User Provider Type Action   07/29/2014 9:51 AM Carla Staggers, Carla Hopkins Physician Sign   07/28/2014 12:27 PM Cathlyn Parsons, PA-C Physician Assistant [place]   View Details Report       Routing History     Date/Time From To Method   07/29/2014 9:51 AM Carla Staggers, Carla Hopkins Carla Staggers, Carla Hopkins In Basket   07/29/2014 9:51 AM Carla Staggers, Carla Hopkins Barton Fanny, Carla Hopkins In Basket

## 2014-08-07 NOTE — Progress Notes (Signed)
Occupational Therapy Weekly Progress Note  Patient Details  Name: Carla Hopkins MRN: [id number] Date of Birth: 03-02-1951  Beginning of progress report period: Jul 31, 2014 End of progress report period: Aug 07, 2014  Today's Date: 08/07/2014 OT Individual Time: 1005-1103 OT Individual Time Calculation (min): 58 min    Patient has met 2 of 3 short term goals.  Pt had a brief interruption of her stay in inpatient rehab of 2 days for a medical procedure. Pt was evaluated on Jul 31, 2014. She was not able to meet all of her goals due to missing a few days of rehab.  Pt's balance had improved to min A versus mod A on admission. She is also more I with bathing and donning pants.  Patient continues to demonstrate the following deficits: L hemiparesis (flaccid in hand and wrist, partial active movement in shoulder), L inattention, decreased problem solving, decreased balance and therefore will continue to benefit from skilled OT intervention to enhance overall performance with BADL and iADL.  Patient not progressing toward long term goals.  See goal revision..  Plan of care revisions: Initially LTGs set at mod I for basic self care but continuing to require 24 hr S in her home..  Due to sensory, cognitive, and visual impairments, LTGs set at overall S with self care and min A with simple meal prep and house keeping.  OT Short Term Goals Week 1:  OT Short Term Goal 1 (Week 1): Pt will bathe with min A. OT Short Term Goal 1 - Progress (Week 1): Met OT Short Term Goal 2 (Week 1): Pt will toilet with min A. OT Short Term Goal 2 - Progress (Week 1): Progressing toward goal OT Short Term Goal 3 (Week 1): Pt will don shirt min A. OT Short Term Goal 3 - Progress (Week 1): Progressing toward goal OT Short Term Goal 4 (Week 1): Pt will don pants with mod A. OT Short Term Goal 4 - Progress (Week 1): Met OT Short Term Goal 5 (Week 1): Pt will be independent with self ROM of LUE. OT Short Term Goal 5 -  Progress (Week 1): Progressing toward goal Week 2:  OT Short Term Goal 1 (Week 2): Pt will complete a stand pivot transfer to toilet with steadying A. OT Short Term Goal 2 (Week 2): Pt will don a tshirt with S. OT Short Term Goal 3 (Week 2): Pt will be able to don B feet into underwear and pants without A from a seated position. OT Short Term Goal 4 (Week 2): Pt will bathe with S using a long sponge to reach R arm. OT Short Term Goal 5 (Week 2): Pt will stand at sink with steadying A to brush teeth.  Skilled Therapeutic Interventions/Progress Updates:    Pt seen for BADL retraining with a brief reassessment of skills.  Pt transferred w/c>< tub bench with grab bars with min A. In shower, needs A to wash R arm and was able to wash R foot with A from therapist to cross her leg. Pt stood with min A. She is demonstrating improved awareness of LLE and trunk control.  Pt was focused on treatment and not emotional this session.  Pt is able to lift R arm 30-40 degrees but has significant edema in L hand from medical procedure.   Provided pt with arm trough as lap tray not fitting well. Sign posted above bed to keep arm elevated on pillows in bed. Pt resting in w/c in  room with call light in reach.  Therapy Documentation Precautions:  Precautions Precautions: Fall Precaution Comments: severe L sided inattention Restrictions Weight Bearing Restrictions: No  Pain: Pain Assessment Pain Assessment: 0-10 Pain Score: 5  Pain Type: Acute pain Pain Location: Hand Pain Orientation: Left Pain Descriptors / Indicators: Aching;Constant;Grimacing;Sharp;Shooting;Sore;Stabbing;Tender;Throbbing Pain Frequency: Constant Pain Onset: On-going Patients Stated Pain Goal: 2 Pain Intervention(s): Medication (See eMAR);Repositioned;Elevated extremity Multiple Pain Sites: No ADL:  See FIM for current functional status  Therapy/Group: Individual Therapy  [facility] 08/07/2014, 11:54 AM

## 2014-08-07 NOTE — Evaluation (Signed)
Speech Language Pathology Assessment and Plan  Patient Details  Name: Carla Hopkins MRN: [id number] Date of Birth: 08-04-1950  SLP Diagnosis: Dysarthria;Dysphagia;Cognitive Impairments  Rehab Potential: Good ELOS: 14-21 days     Today's Date: 08/07/2014 SLP Individual Time: 3893-7342 SLP Individual Time Calculation (min): 15 min   Problem List:  Patient Active Problem List   Diagnosis Date Noted  . History of right MCA stroke 08/06/2014  . Carotid stenosis 08/05/2014  . Acute right MCA stroke 07/30/2014  . Cerebral infarction due to thrombosis of right middle cerebral artery   . Thyroid activity decreased   . Left-sided weakness   . Stroke   . Essential hypertension   . Carotid artery stenosis   . CVA (cerebral infarction) 07/24/2014  . Hyperlipidemia 07/24/2014  . Diabetes 07/24/2014  . Elevated troponin 07/24/2014  . Tobacco abuse 07/24/2014  . Benign essential HTN 07/23/2014  . Obstructive sleep apnea 07/23/2014  . Hypothyroidism 07/03/2014  . Type II or unspecified type diabetes mellitus without mention of complication, not stated as uncontrolled 09/25/2013  . BMI 38.0-38.9,adult 05/19/2013  . Nicotine addiction 05/19/2013  . Right atrial enlargement 05/19/2013   Past Medical History:  Past Medical History  Diagnosis Date  . Hypothyroidism   . Hypertension    Past Surgical History:  Past Surgical History  Procedure Laterality Date  . Tubal ligation    . Endarterectomy Right 08/05/2014    Procedure: Right Carotid ENDARTERECTOMY ;  Surgeon: Elam Dutch, MD;  Location: [place];  Service: Vascular;  Laterality: Right;  . Patch angioplasty Right 08/05/2014    Procedure: PATCH ANGIOPLASTY;  Surgeon: Elam Dutch, MD;  Location: [facility] OR;  Service: Vascular;  Laterality: Right;    Assessment / Plan / Recommendation Clinical Impression   Carla Hopkins is a 64 y.o. right handed female with history of tobacco abuse, hypertension. Patient lives alone independently  prior to admission and working full time. Admitted 07/24/2014 after a fall noting left-sided weakness. MRI of the brain shows multifocal patchy acute right MCA territory infarcts. Additional 8 mm cortical infarct within the parasagittal cortical gray matter of the anterior right frontal lobe, likely right ACA distribution. Patient was admitted for a comprehensive rehabilitation program on 07/30/2014. SLP evaluation was completed on 07/31/2014.  Pt readmitted to acute services for procedure.  Re-admitted to [facility] 08/06/2014.  SLP RE-evaluation completed on 08/07/2014 with the following results:  Pt presents with no significant cognitive changes since initial SLP admission on CIR.  Pt continues to present with mild cognitive deficits characterized by decreased emergent awareness of deficits, left inattention, and decreased functional problem solving.  As a result, no formal cognitive evaluation was completed on this date.  A bedside swallowing evaluation was completed as pt's diet had been downgraded to dys 2 textures from regular while on acute care.  Pt presents with a mild orally based dysphagia resulting from left sided labial weakness and decreased lingual range of motion resulting in mild, diffuse oral residual solids noted in the oral cavity post swallow.  Pt was sensate to residuals and self initiated use of thin liquid wash and lingual sweep to clear.  As a result, recommend that pt be upgraded back to regular textures with continue thin liquids and intermittent supervision for use of swallowing precautions.  Pt would continue to benefit from skilled ST while inpatient in order to maximize functional independence and reduce burden of care prior to discharge.  Continue to recommend 24/7 supervision at discharge due to cognitive deficits.  Pt aware of recommendation.    Skilled Therapeutic Interventions          Bedside swallow evaluation completed with results and recommendations reviewed with family.     SLP  Assessment  Patient will need skilled Speech Lanaguage Pathology Services during CIR admission    Recommendations  SLP Diet Recommendations: Age appropriate regular solids;Thin Liquid Administration via: Cup;Straw Medication Administration: Whole meds with liquid Supervision: Patient able to self feed;Intermittent supervision to cue for compensatory strategies Compensations: Slow rate;Small sips/bites;Check for pocketing;Check for anterior loss Postural Changes and/or Swallow Maneuvers: Seated upright 90 degrees Oral Care Recommendations: Oral care BID Patient destination: Home Follow up Recommendations: Home Health SLP;24 hour supervision/assistance Equipment Recommended: None recommended by SLP    SLP Frequency 3 to 5 out of 7 days   SLP Treatment/Interventions Cognitive remediation/compensation;Cueing hierarchy;Dysphagia/aspiration precaution training;Patient/family education;Speech/Language facilitation;Oral motor exercises;Internal/external aids;Environmental controls    Pain Pain Assessment Pain Assessment: No/denies pain Prior Functioning Cognitive/Linguistic Baseline: Within functional limits Type of Home: House  Lives With: Alone Available Help at Discharge: Friend(s);Available PRN/intermittently Vocation: Full time employment  Short Term Goals: Week 1: SLP Short Term Goal 1 (Week 1): Pt will attend to left body/environment during semi-complex functional tasks with min assist verbal cues SLP Short Term Goal 2 (Week 1): Pt will complete semi-complex tasks with min assist verbal cues for functional problem solving.   SLP Short Term Goal 3 (Week 1): Pt will return demonstration of oral motor strengthening exercises targeting left sided weakness with supervision.  SLP Short Term Goal 4 (Week 1): Pt will utilize slow rate and overarticulation to achieve intelligibility in conversations with supervision.  SLP Short Term Goal 5 (Week 1): Pt will consume regular textures and thin  liquids with mod I self monitoring and correction of left sided buccal residue.    See FIM for current functional status Refer to Care Plan for Long Term Goals  Recommendations for other services: None  Discharge Criteria: Patient will be discharged from SLP if patient refuses treatment 3 consecutive times without medical reason, if treatment goals not met, if there is a change in medical status, if patient makes no progress towards goals or if patient is discharged from hospital.  The above assessment, treatment plan, treatment alternatives and goals were discussed and mutually agreed upon: by patient  Emilio Math 08/07/2014, 10:02 AM

## 2014-08-07 NOTE — Progress Notes (Signed)
Social Work Patient ID: Carla Hopkins, female   DOB: [birth year], 64 y.o.   MRN: [id number] Met with pt to discuss team recommends 24hr care upon discharge form rehab.  Pt wanted to know what options were out there for her.  Discussed hired assistance but not covered by insurance and NHP.  Pt doesn't want to go to a NH, she would rather take her chances at home. Gave her the list of private agencies and she will go over with her daughter.  Her daughter plans to come back for maybe one week.  She is concerned That she get home but also it is safe for her.  Will work with she and daughter on safe discharge plan, daughter has gone back to [employer].

## 2014-08-08 ENCOUNTER — Inpatient Hospital Stay (HOSPITAL_COMMUNITY): Payer: BLUE CROSS/BLUE SHIELD | Admitting: Occupational Therapy

## 2014-08-08 ENCOUNTER — Inpatient Hospital Stay (HOSPITAL_COMMUNITY): Payer: BLUE CROSS/BLUE SHIELD | Admitting: Speech Pathology

## 2014-08-08 ENCOUNTER — Inpatient Hospital Stay (HOSPITAL_COMMUNITY): Payer: BLUE CROSS/BLUE SHIELD

## 2014-08-08 LAB — GLUCOSE, CAPILLARY
GLUCOSE-CAPILLARY: 98 mg/dL (ref 65–99)
GLUCOSE-CAPILLARY: 96 mg/dL (ref 65–99)
Glucose-Capillary: 131 mg/dL — ABNORMAL HIGH (ref 65–99)
Glucose-Capillary: 125 mg/dL — ABNORMAL HIGH (ref 65–99)

## 2014-08-08 MED ORDER — CITALOPRAM HYDROBROMIDE 10 MG PO TABS
10.0000 mg | ORAL_TABLET | Freq: Every day | ORAL | Status: DC
  Administered 2014-08-08 – 2014-08-22 (×15): 10 mg via ORAL
  Filled 2014-08-08 (×17): qty 1

## 2014-08-08 NOTE — Progress Notes (Signed)
64 y.o. right handed female with history of tobacco abuse, hypertension. Patient lives alone independently prior to admission and working full time. Admitted 07/24/2014 after a fall noting left-sided weakness. No head trauma no loss of consciousness. MRI of the brain shows multifocal patchy acute right MCA territory infarcts. Additional 8 mm cortical infarct within the parasagittal cortical gray matter of the anterior right frontal lobe, likely right ACA distribution. MRA of the head with right MCA M2 inferior division occlusion. CT angiogram chest negative except for a 6 mm pulmonary nodule on the right with recommendation follow-up CT the chest in 6-12 months. Elevated troponin 4.17. Echocardiogram with ejection fraction of 70% and grade 1 diastolic dysfunction. Carotid Doppler with left 50-60% and right 80% ICA stenosis. Patient did not receive TPA. Vascular surgery Dr. Oneida Alar consulted in relation to ICA stenosis plan follow-up as an outpatient to proceed with possible CEA. Follow-up cardiology for elevated troponin not felt to be due to ACS per Dr. Burt Knack and no further workup was indicated. Neurology consulted presently on aspirin and Plavix for CVA prophylaxis for 3 months then Plavix alone. Subcutaneous heparin for DVT prophylaxis. Findings of elevated hemoglobin A1c of 8.5 and placed on Glucophage with sliding scale insulin. Dysphagia 2 nectar thick liquid diet with follow per speech therapy. Physica and occupational l therapy evaluations completed with recommendations of physical medicine rehabilitation consult. Patient was admitted for a comprehensive rehabilitation program 07/30/2014. She was requiring mod max assist for mobility and transfers some visual attention issues on the left. Arrangements were made by vascular surgery for right CEA she was discharged from acute care services underwent right carotid surgery 08/05/2014 per Dr. Oneida Alar  Subjective/Complaints: No new issues overnite, neck pain  improved, still having swelling of Left hand Review of Systems - Negative except Left arm weakObjective: Vital Signs: Blood pressure 105/56, pulse 75, temperature 97.5 F (36.4 C), temperature source Oral, resp. rate 17, height 5' 2" (1.575 m), weight 94.303 kg (207 lb 14.4 oz), SpO2 95 %. No results found. Results for orders placed or performed during the hospital encounter of 08/06/14 (from the past 72 hour(s))  Glucose, capillary     Status: Abnormal   Collection Time: 08/06/14  4:13 PM  Result Value Ref Range   Glucose-Capillary 136 (H) 70 - 99 mg/dL   Comment 1 Notify RN   CBC     Status: Abnormal   Collection Time: 08/06/14  4:39 PM  Result Value Ref Range   WBC 14.9 (H) 4.0 - 10.5 K/uL   RBC 4.76 3.87 - 5.11 MIL/uL   Hemoglobin 13.6 12.0 - 15.0 g/dL   HCT 41.7 36.0 - 46.0 %   MCV 87.6 78.0 - 100.0 fL   MCH 28.6 26.0 - 34.0 pg   MCHC 32.6 30.0 - 36.0 g/dL   RDW 15.4 11.5 - 15.5 %   Platelets 215 150 - 400 K/uL  Creatinine, serum     Status: None   Collection Time: 08/06/14  4:39 PM  Result Value Ref Range   Creatinine, Ser 0.88 0.44 - 1.00 mg/dL   GFR calc non Af Amer >60 >60 mL/min   GFR calc Af Amer >60 >60 mL/min    Comment: (NOTE) The eGFR has been calculated using the CKD EPI equation. This calculation has not been validated in all clinical situations. eGFR's persistently <60 mL/min signify possible Chronic Kidney Disease.   Glucose, capillary     Status: Abnormal   Collection Time: 08/06/14  9:13 PM  Result Value  Ref Range   Glucose-Capillary 145 (H) 70 - 99 mg/dL  CBC WITH DIFFERENTIAL     Status: Abnormal   Collection Time: 08/07/14  6:25 AM  Result Value Ref Range   WBC 12.0 (H) 4.0 - 10.5 K/uL   RBC 4.40 3.87 - 5.11 MIL/uL   Hemoglobin 12.5 12.0 - 15.0 g/dL   HCT 38.6 36.0 - 46.0 %   MCV 87.7 78.0 - 100.0 fL   MCH 28.4 26.0 - 34.0 pg   MCHC 32.4 30.0 - 36.0 g/dL   RDW 15.5 11.5 - 15.5 %   Platelets 226 150 - 400 K/uL   Neutrophils Relative % 70 43 -  77 %   Neutro Abs 8.3 (H) 1.7 - 7.7 K/uL   Lymphocytes Relative 19 12 - 46 %   Lymphs Abs 2.3 0.7 - 4.0 K/uL   Monocytes Relative 8 3 - 12 %   Monocytes Absolute 1.0 0.1 - 1.0 K/uL   Eosinophils Relative 2 0 - 5 %   Eosinophils Absolute 0.3 0.0 - 0.7 K/uL   Basophils Relative 1 0 - 1 %   Basophils Absolute 0.1 0.0 - 0.1 K/uL  Comprehensive metabolic panel     Status: Abnormal   Collection Time: 08/07/14  6:25 AM  Result Value Ref Range   Sodium 138 135 - 145 mmol/L   Potassium 4.3 3.5 - 5.1 mmol/L   Chloride 105 101 - 111 mmol/L   CO2 25 22 - 32 mmol/L   Glucose, Bld 150 (H) 65 - 99 mg/dL   BUN 13 6 - 20 mg/dL   Creatinine, Ser 0.81 0.44 - 1.00 mg/dL   Calcium 9.1 8.9 - 10.3 mg/dL   Total Protein 5.6 (L) 6.5 - 8.1 g/dL   Albumin 3.0 (L) 3.5 - 5.0 g/dL   AST 21 15 - 41 U/L   ALT 24 14 - 54 U/L   Alkaline Phosphatase 66 38 - 126 U/L   Total Bilirubin 0.8 0.3 - 1.2 mg/dL   GFR calc non Af Amer >60 >60 mL/min   GFR calc Af Amer >60 >60 mL/min    Comment: (NOTE) The eGFR has been calculated using the CKD EPI equation. This calculation has not been validated in all clinical situations. eGFR's persistently <60 mL/min signify possible Chronic Kidney Disease.    Anion gap 8 5 - 15  Glucose, capillary     Status: Abnormal   Collection Time: 08/07/14  6:45 AM  Result Value Ref Range   Glucose-Capillary 174 (H) 65 - 99 mg/dL  Glucose, capillary     Status: Abnormal   Collection Time: 08/07/14 11:31 AM  Result Value Ref Range   Glucose-Capillary 111 (H) 65 - 99 mg/dL   Comment 1 Notify RN   Glucose, capillary     Status: Abnormal   Collection Time: 08/07/14  4:53 PM  Result Value Ref Range   Glucose-Capillary 122 (H) 65 - 99 mg/dL  Glucose, capillary     Status: Abnormal   Collection Time: 08/07/14  8:56 PM  Result Value Ref Range   Glucose-Capillary 129 (H) 65 - 99 mg/dL      HEENT: Left sided neck incision with gauze dressing no swelling, minimal eccymosis Cardio: RRR and  no murmur Resp: CTA B/L and unlabored GI: BS positive and NT, ND Extremity:  Pulses positive and No Edema Skin:   Bruise ecchymosis , left dorsum of hand Neuro: Alert/Oriented, Abnormal Sensory absent LT in LUE, Abnormal Motor 2- Left delt ,3-  bi, 2- tri, 3-HF KE, Inattention and Other Left field cut Musc/Skel:  Swelling dorsum LEft hand, ecchymosis Gen NAD   Assessment/Plan: 1. Functional deficits secondary to Right MCA infarct with Left hemiparesis, left neglect and anosognsia which require 3+ hours per day of interdisciplinary therapy in a comprehensive inpatient rehab setting. Physiatrist is providing close team supervision and 24 hour management of active medical problems listed below. Physiatrist and rehab team continue to assess barriers to discharge/monitor patient progress toward functional and medical goals. FIM: FIM - Bathing Bathing Steps Patient Completed: Chest, Left Arm, Abdomen, Buttocks, Right upper leg, Right lower leg (including foot), Left upper leg, Left lower leg (including foot), Front perineal area Bathing: 4: Min-Patient completes 8-9 60f10 parts or 75+ percent  FIM - Upper Body Dressing/Undressing Upper body dressing/undressing steps patient completed: Thread/unthread right bra strap, Thread/unthread right sleeve of pullover shirt/dresss, Put head through opening of pull over shirt/dress Upper body dressing/undressing: 2: Max-Patient completed 25-49% of tasks FIM - Lower Body Dressing/Undressing Lower body dressing/undressing steps patient completed: Thread/unthread right underwear leg, Thread/unthread left underwear leg, Thread/unthread left pants leg Lower body dressing/undressing: 2: Max-Patient completed 25-49% of tasks              Comprehension Comprehension Mode: Auditory Comprehension: 6-Follows complex conversation/direction: With extra time/assistive device  Expression Expression Mode: Verbal Expression: 5-Expresses complex 90% of the  time/cues < 10% of the time  Social Interaction Social Interaction: 6-Interacts appropriately with others with medication or extra time (anti-anxiety, antidepressant).  Problem Solving Problem Solving: 4-Solves basic 75 - 89% of the time/requires cueing 10 - 24% of the time  Memory Memory: 4-Recognizes or recalls 75 - 89% of the time/requires cueing 10 - 24% of the time  Medical Problem List and Plan: 1. Functional deficits secondary to right MCA infarct 2.  DVT Prophylaxis/Anticoagulation: Subcutaneous Lovenox for DVT prophylaxis. Monitor for any bleeding episodes 3. Pain Management: Percocet as needed. Monitor with increased mobility 4. Right carotid stenosis. Status post carotid endarterectomy 08/05/2014 5. Neuropsych: This patient is capable of making decisions on her own behalf. 6. Skin/Wound Care: Routine skin checks 7. Fluids/Electrolytes/Nutrition: Strict I and O's with follow-up chemistries 8. Dysphagia. Dysphagia #2 sent liquids follow-up speech therapy, adequate po fluids d/c IV 9. Hypertension. Lisinopril 5 mg daily. Monitor with increased mobility 10. Diabetes mellitus. Hemoglobin A1c 8.5. Glucophage 500 mg twice a day. Check blood sugars before meals and at bedtime with diabetic teaching 11. Hypothyroidism. Synthroid 12. Hyperlipidemia. Lipitor 13. IV infiltration left hand. Conservative care 14. 6 mm pulmonary nodule identified on CT and GL chest. Recommendations follow-up CT of the chest in 6-12 months  LOS (Days) 2 A FACE TO FACE EVALUATION WAS PERFORMED  KIRSTEINS,ANDREW E 08/08/2014, 6:44 AM

## 2014-08-08 NOTE — Progress Notes (Signed)
Occupational Therapy Session Note  Patient Details  Name: Carla Hopkins MRN: [id number] Date of Birth: 1950/05/26  Today's Date: 08/08/2014 OT Individual Time:  -   1130-1200  (30 min)      Short Term Goals: Week 1:  OT Short Term Goal 1 (Week 1): Pt will bathe with min A. OT Short Term Goal 1 - Progress (Week 1): Met OT Short Term Goal 2 (Week 1): Pt will toilet with min A. OT Short Term Goal 2 - Progress (Week 1): Progressing toward goal OT Short Term Goal 3 (Week 1): Pt will don shirt min A. OT Short Term Goal 3 - Progress (Week 1): Progressing toward goal OT Short Term Goal 4 (Week 1): Pt will don pants with mod A. OT Short Term Goal 4 - Progress (Week 1): Met OT Short Term Goal 5 (Week 1): Pt will be independent with self ROM of LUE. OT Short Term Goal 5 - Progress (Week 1): Progressing toward goal Week 2:  OT Short Term Goal 1 (Week 2): Pt will complete a stand pivot transfer to toilet with steadying A. OT Short Term Goal 2 (Week 2): Pt will don a tshirt with S. OT Short Term Goal 3 (Week 2): Pt will be able to don B feet into underwear and pants without A from a seated position. OT Short Term Goal 4 (Week 2): Pt will bathe with S using a long sponge to reach R arm. OT Short Term Goal 5 (Week 2): Pt will stand at sink with steadying A to brush teeth.  Skilled Therapeutic Interventions/Progress Updates:    Pt worked on LUE neuromuscular re-education for the LUE, positioning, and retrograde massage.  Focus on LUE movement with scapular adduction, sho. Forward flexion and extension, bicep/tricep with elbow flexion and extension.  Pt did 2 sets of 25.  LUE with increased swelling and hematoma on the left hand. Focused on active arm movements with shoulder flexion, internal and external rotation, and elbow extension..  Educated pt on retrograde massage and using towel built up device on bedside table for increased elevation.  Pt attended to LUE with mod cues and physical assist.  Pt.  Interested in stoke support group.  Informed her of meeting on Sunday.  Left ppt in wc with food tray.  Set pt up for eating.  Therapy Documentation Precautions:  Precautions Precautions: Fall Precaution Comments: severe L sided inattention Restrictions Weight Bearing Restrictions: No      Pain: Pain Assessment Pain Assessment: No/denies pain          See FIM for current functional status  Therapy/Group: Individual Therapy  Lisa Roca 08/08/2014, 8:27 AM

## 2014-08-08 NOTE — Progress Notes (Signed)
Physical Therapy Session Note  Patient Details  Name: Carla Hopkins MRN: [id number] Date of Birth: 02/07/1951  Today's Date: 08/08/2014 PT Individual Time: 0920-1020 PT Individual Time Calculation (min): 60 min   Short Term Goals: Week 1:  PT Short Term Goal 1 - Progress (Week 1): Met PT Short Term Goal 2 - Progress (Week 1): Not met PT Short Term Goal 3 - Progress (Week 1): Progressing toward goal PT Short Term Goal 4 - Progress (Week 1): Met  Skilled Therapeutic Interventions/Progress Updates:  Patient sitting in wheelchair upon entering room. Session focused on standing balance, standing tolerance, ambulation, stairs, and LE strengthening exercises. Patient ambulated with small based quad cane 80 feet with min assist around obstacles. Patient demonstrating left inattention and needing cues to attend to left and to increase hip and knee flexion during swing on left. Patient performed Otaga exercises in standing x 10 reps each. Patient worked on Visual merchandiser LE on and off step, alternating 2 and 3 numbers, and ambulating sideways to facilitate left hip control during stance. Patient stood x 3 minutes working on UE task for LE endurance. Patient ambulated without assistive device 85 feet with min HHA on left. Patient propelled wheelchair 60 feet using left extremities-hemi technique with min assist and cues to attend to left. Patient left in wheelchair with all items in reach.  Therapy Documentation Precautions:  Precautions Precautions: Fall Precaution Comments: severe L sided inattention Restrictions Weight Bearing Restrictions: No  Pain: Pain Assessment Pain Assessment: No/denies pain Pain Score: 0-No pain  Locomotion : Ambulation Ambulation/Gait Assistance: 4: Min assist Wheelchair Mobility Distance: 60    See FIM for current functional status  Therapy/Group: Individual Therapy  Elder Love M 08/08/2014, 10:26 AM

## 2014-08-08 NOTE — Progress Notes (Signed)
Occupational Therapy Session Note  Patient Details  Name: Carla Hopkins MRN: [id number] Date of Birth: Dec 11, 1950  Today's Date: 08/08/2014 OT Individual Time: 0800-0900 OT Individual Time Calculation (min): 60 min    Short Term Goals: Week 1:  OT Short Term Goal 1 (Week 1): Pt will bathe with min A. OT Short Term Goal 1 - Progress (Week 1): Met OT Short Term Goal 2 (Week 1): Pt will toilet with min A. OT Short Term Goal 2 - Progress (Week 1): Progressing toward goal OT Short Term Goal 3 (Week 1): Pt will don shirt min A. OT Short Term Goal 3 - Progress (Week 1): Progressing toward goal OT Short Term Goal 4 (Week 1): Pt will don pants with mod A. OT Short Term Goal 4 - Progress (Week 1): Met OT Short Term Goal 5 (Week 1): Pt will be independent with self ROM of LUE. OT Short Term Goal 5 - Progress (Week 1): Progressing toward goal Week 2:  OT Short Term Goal 1 (Week 2): Pt will complete a stand pivot transfer to toilet with steadying A. OT Short Term Goal 2 (Week 2): Pt will don a tshirt with S. OT Short Term Goal 3 (Week 2): Pt will be able to don B feet into underwear and pants without A from a seated position. OT Short Term Goal 4 (Week 2): Pt will bathe with S using a long sponge to reach R arm. OT Short Term Goal 5 (Week 2): Pt will stand at sink with steadying A to brush teeth.  Skilled Therapeutic Interventions/Progress Updates:    Pt seen for BADL retraining to focus on dynamic standing balance and adaptive techniques.  Pt is demonstrating excellent improvement in her balance and LLE stability to transfer stand pivot with close S and to stand at sink for grooming and donning shirt.  Improved awareness of LUE during self care but continues to require cues for safe techniques, sequencing and spatial relations do don clothing. AROM exercises to R shoulder. Wrapped R fingers and hand in coban to reduce edema in hand. Reviewed positioning techniques with pt.  Pt resting in chair with  call light in reach.  Therapy Documentation Precautions:  Precautions Precautions: Fall Precaution Comments: severe L sided inattention Restrictions Weight Bearing Restrictions: No    Vital Signs: Therapy Vitals Temp: 97.5 F (36.4 C) Temp Source: Oral Pulse Rate: 75 Resp: 17 BP: (!) 105/56 mmHg Patient Position (if appropriate): Lying Oxygen Therapy SpO2: 95 % O2 Device: Not Delivered Pain:   ADL:  See FIM for current functional status  Therapy/Group: Individual Therapy  Reader 08/08/2014, 7:58 AM

## 2014-08-08 NOTE — Progress Notes (Signed)
Pt's. left hand/wrist remains swollen, bruised, warm to touch; painful and tender to touch. Serous filled blister present; Tegaderm applied. Pulses 2+. Required PRN pain medication x 2 today.  Lt arm elevated. PAC aware.

## 2014-08-08 NOTE — IPOC Note (Signed)
Overall Plan of Care [facility]) Patient Details Name: Carla Hopkins MRN: [id number] DOB: [birth year]  Admitting Diagnosis: R MCA CVA   Hospital Problems: Active Problems:   Left-sided weakness   Essential hypertension   History of right MCA stroke     Functional Problem List: Nursing Bowel, Medication Management, Skin Integrity, Safety  PT    OT    SLP    TR         Basic ADL's: OT       Advanced  ADL's: OT       Transfers: PT    OT       Locomotion: PT       Additional Impairments: OT    SLP        TR      Anticipated Outcomes Item Anticipated Outcome  Self Feeding    Swallowing  mod I   Basic self-care     Toileting      Bathroom Transfers    Bowel/Bladder  Continent of bowel and bladder. LBM 5/9  Transfers     Locomotion     Communication  Mod i  Cognition  Mod I  Pain  <3  Safety/Judgment  minimal assistance   Therapy Plan:     SLP Intensity: Minumum of 1-2 x/day, 30 to 90 minutes SLP Frequency: 3 to 5 out of 7 days SLP Duration/Estimated Length of Stay: 14-21 days        Team Interventions: Nursing Interventions Patient/Family Education, Pain Management, Disease Management/Prevention, Skin Care/Wound Management, Medication Management, Psychosocial Support, Discharge Planning  PT interventions    OT Interventions    SLP Interventions Cognitive remediation/compensation, Cueing hierarchy, Dysphagia/aspiration precaution training, Patient/family education, Speech/Language facilitation, Oral motor exercises, Internal/external aids, Environmental controls  TR Interventions    SW/CM Interventions Discharge Planning, Psychosocial Support, Patient/Family Education    Team Discharge Planning: Destination: PT-  ,OT-   , SLP-Home Projected Follow-up: PT- , OT-   , SLP-Home Health SLP, 24 hour supervision/assistance Projected Equipment Needs: PT- , OT-  , SLP-None recommended by SLP Equipment Details: PT- , OT-  Patient/family involved in  discharge planning: PT-  ,  OT- , SLP-Patient  MD ELOS: 17-20d Medical Rehab Prognosis:  Good Assessment: 64 y.o. right handed female with history of tobacco abuse, hypertension. Patient lives alone independently prior to admission and working full time. Admitted 07/24/2014 after a fall noting left-sided weakness. No head trauma no loss of consciousness. MRI of the brain shows multifocal patchy acute right MCA territory infarcts. Additional 8 mm cortical infarct within the parasagittal cortical gray matter of the anterior right frontal lobe, likely right ACA distribution. MRA of the head with right MCA M2 inferior division occlusion. CT angiogram chest negative except for a 6 mm pulmonary nodule on the right with recommendation follow-up CT the chest in 6-12 months. Elevated troponin 4.17. Echocardiogram with ejection fraction of 70% and grade 1 diastolic dysfunction. Carotid Doppler with left 50-60% and right 80% ICA stenosis. Patient did not receive TPA. Vascular surgery Dr. Oneida Alar consulted in relation to ICA stenosis plan follow-up as an outpatient to proceed with possible CEA. Follow-up cardiology for elevated troponin not felt to be due to ACS per Dr. Burt Knack and no further workup was indicated. Neurology consulted presently on aspirin and Plavix for CVA prophylaxis for 3 months then Plavix alone. Subcutaneous heparin for DVT prophylaxis. Findings of elevated hemoglobin A1c of 8.5 and placed on Glucophage with sliding scale insulin. Dysphagia 2 nectar thick liquid diet with follow  per speech therapy. Physica and occupational l therapy evaluations completed with recommendations of physical medicine rehabilitation consult. Patient was admitted for a comprehensive rehabilitation program 07/30/2014. She was requiring mod max assist for mobility and transfers some visual attention issues on the left. Arrangements were made by vascular surgery for right CEA she was discharged from acute care services underwent  right carotid surgery 08/05/2014 per Dr. Oneida Alar   Now requiring 24/7 Rehab RN,MD, as well as CIR level PT, OT and SLP.  Treatment team will focus on ADLs and mobility with goals set at Sup  See Team Conference Notes for weekly updates to the plan of care

## 2014-08-08 NOTE — Progress Notes (Signed)
Speech Language Pathology Daily Session Note  Patient Details  Name: Carla Hopkins MRN: [id number] Date of Birth: November 10, 1950  Today's Date: 08/08/2014 SLP Individual Time: 1400-1500 SLP Individual Time Calculation (min): 60 min  Short Term Goals: Week 1: SLP Short Term Goal 1 (Week 1): Pt will attend to left body/environment during semi-complex functional tasks with min assist verbal cues SLP Short Term Goal 2 (Week 1): Pt will complete semi-complex tasks with min assist verbal cues for functional problem solving.   SLP Short Term Goal 3 (Week 1): Pt will return demonstration of oral motor strengthening exercises targeting left sided weakness with supervision.  SLP Short Term Goal 4 (Week 1): Pt will utilize slow rate and overarticulation to achieve intelligibility in conversations with supervision.  SLP Short Term Goal 5 (Week 1): Pt will consume regular textures and thin liquids with mod I self monitoring and correction of left sided buccal residue.    Skilled Therapeutic Interventions: Skilled treatment session focused on cognitive goals. SLP facilitated session by providing Min A verbal cues for functional problem solving, organization and emergent awareness during a mildly complex task for organizing a 4 time per day pill box.  Patient recalled events from morning therapy sessions with Mod I and demonstrated selective attention with Min A verbal cues for 45 minutes to task  and attention to left field of environment throughout task with Min A verbal cues. Patient left upright in wheelchair with visitor present and all needs within reach. Continue with current plan of care.     FIM:  Comprehension Comprehension Mode: Auditory Expression Expression Mode: Verbal Expression: 5-Expresses complex 90% of the time/cues < 10% of the time Social Interaction Social Interaction: 6-Interacts appropriately with others with medication or extra time (anti-anxiety, antidepressant). Problem  Solving Problem Solving: 4-Solves basic 75 - 89% of the time/requires cueing 10 - 24% of the time Memory Memory: 4-Recognizes or recalls 75 - 89% of the time/requires cueing 10 - 24% of the time  Pain Pain Assessment Pain Assessment: No/denies pain  Therapy/Group: Individual Therapy  Julina Altmann 08/08/2014, 5:28 PM

## 2014-08-09 ENCOUNTER — Inpatient Hospital Stay (HOSPITAL_COMMUNITY): Payer: BLUE CROSS/BLUE SHIELD | Admitting: Speech Pathology

## 2014-08-09 ENCOUNTER — Inpatient Hospital Stay (HOSPITAL_COMMUNITY): Payer: BLUE CROSS/BLUE SHIELD | Admitting: Occupational Therapy

## 2014-08-09 ENCOUNTER — Inpatient Hospital Stay (HOSPITAL_COMMUNITY): Payer: BLUE CROSS/BLUE SHIELD | Admitting: *Deleted

## 2014-08-09 LAB — GLUCOSE, CAPILLARY
GLUCOSE-CAPILLARY: 113 mg/dL — AB (ref 65–99)
Glucose-Capillary: 133 mg/dL — ABNORMAL HIGH (ref 65–99)
Glucose-Capillary: 121 mg/dL — ABNORMAL HIGH (ref 65–99)
Glucose-Capillary: 107 mg/dL — ABNORMAL HIGH (ref 65–99)

## 2014-08-09 NOTE — Progress Notes (Signed)
Speech Language Pathology Daily Session Note  Patient Details  Name: Carla Hopkins MRN: [id number] Date of Birth: 1950-08-20  Today's Date: 08/09/2014 SLP Individual Time: 0800-0900 SLP Individual Time Calculation (min): 60 min  Short Term Goals: Week 1: SLP Short Term Goal 1 (Week 1): Pt will attend to left body/environment during semi-complex functional tasks with min assist verbal cues SLP Short Term Goal 2 (Week 1): Pt will complete semi-complex tasks with min assist verbal cues for functional problem solving.   SLP Short Term Goal 3 (Week 1): Pt will return demonstration of oral motor strengthening exercises targeting left sided weakness with supervision.  SLP Short Term Goal 4 (Week 1): Pt will utilize slow rate and overarticulation to achieve intelligibility in conversations with supervision.  SLP Short Term Goal 5 (Week 1): Pt will consume regular textures and thin liquids with mod I self monitoring and correction of left sided buccal residue.    Skilled Therapeutic Interventions: Skilled treatment session focused on cognitive goals. Upon arrival, patient was sitting upright in wheelchair and was agreeable to participate in treatment session. Patient independently requested to use the bathroom but reported to this clinician she could perform the task by herself. Patient reeducated on her current physical deficits and limitations and the need for assistance at this time. Patient was continent of bowel and bladder and required total A for perineal care. SLP facilitated session by providing Min-Mod A verbal cues for problem solving and attention to left field of environment during a mildly complex task in regards to navigating the internet to locate specific information about private duty agencies. Patient left upright in wheelchair with all needs within reach. Continue with current plan of care.     FIM:  Comprehension Comprehension Mode: Auditory Comprehension: 6-Follows complex  conversation/direction: With extra time/assistive device Expression Expression Mode: Verbal Expression: 5-Expresses complex 90% of the time/cues < 10% of the time Social Interaction Social Interaction: 6-Interacts appropriately with others with medication or extra time (anti-anxiety, antidepressant). Problem Solving Problem Solving: 4-Solves basic 75 - 89% of the time/requires cueing 10 - 24% of the time Memory Memory: 4-Recognizes or recalls 75 - 89% of the time/requires cueing 10 - 24% of the time  Pain Pain Assessment Pain Assessment: No/denies pain  Therapy/Group: Individual Therapy  Carla Hopkins 08/09/2014, 4:39 PM

## 2014-08-09 NOTE — Progress Notes (Signed)
Occupational Therapy Session Note  Patient Details  Name: Carla Hopkins MRN: [id number] Date of Birth: 1950-06-27  Today's Date: 08/09/2014 OT Individual Time:  -   0300-9233   (54 min)      Short Term Goals: Week 1:  OT Short Term Goal 1 (Week 1): Pt will bathe with min A. OT Short Term Goal 1 - Progress (Week 1): Met OT Short Term Goal 2 (Week 1): Pt will toilet with min A. OT Short Term Goal 2 - Progress (Week 1): Progressing toward goal OT Short Term Goal 3 (Week 1): Pt will don shirt min A. OT Short Term Goal 3 - Progress (Week 1): Progressing toward goal OT Short Term Goal 4 (Week 1): Pt will don pants with mod A. OT Short Term Goal 4 - Progress (Week 1): Met OT Short Term Goal 5 (Week 1): Pt will be independent with self ROM of LUE. OT Short Term Goal 5 - Progress (Week 1): Progressing toward goal  Skilled Therapeutic Interventions/Progress Updates:    Addressed LUE NMR, L attention, balance, problem solving.  Pt. Sitting in recliner with left UE on side of body with coban on hand as well as resting hand splint.    Pt stated she was unable to exercise her fingers with the splint on.  Educated her on wearing the splint at bedtime and placing LUE on the handmade wedge or rolling table during the day.  LUE sensation is diminished .  Instructed pt about being diligent about looking to the left.  Went over retrograde massage, elevation,positioning of LUE.  Ambullated with quad cane to bathroom and then out in hallway with minimal assist.  Pt ambulated to sink.  Stood and brushed teeth, combed hair.  Pt. Returned to recliner.  Positioned LUE on bedside table and left with all needs in reach.    Therapy Documentation Precautions:  Precautions Precautions: Fall Precaution Comments: severe L sided inattention Restrictions Weight Bearing Restrictions: No General:     Pain: Pain Assessment Pain Assessment: No/denies pain     See FIM for current functional  status  Therapy/Group: Individual Therapy  Lisa Roca 08/09/2014, 8:30 AM

## 2014-08-09 NOTE — Progress Notes (Signed)
Physical Therapy Session Note  Patient Details  Name: Carla Hopkins MRN: [id number] Date of Birth: May 20, 1950  Today's Date: 08/09/2014 PT Individual Time: 0277-4128 PT Individual Time Calculation (min): 30 min    Skilled Therapeutic Interventions/Progress Updates:  Patient in room finished with OT, agrees to therapy session ,no complains of pain or discomfort. Session focused on gait training with [facility] and with CGA to min A when maneuvering around objects on the L side. Gait training 4 x 75 feet .  Training in balance and attention ,manuvering obstacle courses,including stepping over obstacles, standing on foam board, and target stepping. Patient returned to room ,left sitting in a recliner all needs within reach.  Therapy Documentation Precautions:  Precautions Precautions: Fall Precaution Comments: severe L sided inattention Restrictions Weight Bearing Restrictions: No Vital Signs: Therapy Vitals Temp: 98.4 F (36.9 C) Temp Source: Oral Pulse Rate: 81 Resp: 16 BP: 109/63 mmHg Patient Position (if appropriate): Sitting Oxygen Therapy SpO2: 98 % O2 Device: Not Delivered  See FIM for current functional status  Therapy/Group: Individual Therapy  Carla Hopkins 08/09/2014, 3:43 PM

## 2014-08-09 NOTE — Progress Notes (Signed)
64 y.o. right handed female with history of tobacco abuse, hypertension. Patient lives alone independently prior to admission and working full time. Admitted 07/24/2014 after a fall noting left-sided weakness. No head trauma no loss of consciousness. MRI of the brain shows multifocal patchy acute right MCA territory infarcts. Additional 8 mm cortical infarct within the parasagittal cortical gray matter of the anterior right frontal lobe, likely right ACA distribution. MRA of the head with right MCA M2 inferior division occlusion. CT angiogram chest negative except for a 6 mm pulmonary nodule on the right with recommendation follow-up CT the chest in 6-12 months. Elevated troponin 4.17. Echocardiogram with ejection fraction of 70% and grade 1 diastolic dysfunction. Carotid Doppler with left 50-60% and right 80% ICA stenosis. Patient did not receive TPA. Vascular surgery Dr. Oneida Alar consulted in relation to ICA stenosis plan follow-up as an outpatient to proceed with possible CEA. Follow-up cardiology for elevated troponin not felt to be due to ACS per Dr. Burt Knack and no further workup was indicated. Neurology consulted presently on aspirin and Plavix for CVA prophylaxis for 3 months then Plavix alone. Subcutaneous heparin for DVT prophylaxis. Findings of elevated hemoglobin A1c of 8.5 and placed on Glucophage with sliding scale insulin. Dysphagia 2 nectar thick liquid diet with follow per speech therapy. Physica and occupational l therapy evaluations completed with recommendations of physical medicine rehabilitation consult. Patient was admitted for a comprehensive rehabilitation program 07/30/2014. She was requiring mod max assist for mobility and transfers some visual attention issues on the left. Arrangements were made by vascular surgery for right CEA she was discharged from acute care services underwent right carotid surgery 08/05/2014 per Dr. Oneida Alar  Subjective/Complaints: Swelling better with coban wrap.  Slept well. No dysphagia. Review of Systems - Negative except Left arm weakObjective: Vital Signs: Blood pressure 127/64, pulse 82, temperature 98.4 F (36.9 C), temperature source Oral, resp. rate 18, height _0  (1.575 m), weight 94.303 kg (207 lb 14.4 oz), SpO2 97 %. No results found. Results for orders placed or performed during the hospital encounter of 08/06/14 (from the past 72 hour(s))  Glucose, capillary     Status: Abnormal   Collection Time: 08/06/14  4:13 PM  Result Value Ref Range   Glucose-Capillary 136 (H) 70 - 99 mg/dL   Comment 1 Notify RN   CBC     Status: Abnormal   Collection Time: 08/06/14  4:39 PM  Result Value Ref Range   WBC 14.9 (H) 4.0 - 10.5 K/uL   RBC 4.76 3.87 - 5.11 MIL/uL   Hemoglobin 13.6 12.0 - 15.0 g/dL   HCT 41.7 36.0 - 46.0 %   MCV 87.6 78.0 - 100.0 fL   MCH 28.6 26.0 - 34.0 pg   MCHC 32.6 30.0 - 36.0 g/dL   RDW 15.4 11.5 - 15.5 %   Platelets 215 150 - 400 K/uL  Creatinine, serum     Status: None   Collection Time: 08/06/14  4:39 PM  Result Value Ref Range   Creatinine, Ser 0.88 0.44 - 1.00 mg/dL   GFR calc non Af Amer >60 >60 mL/min   GFR calc Af Amer >60 >60 mL/min    Comment: (NOTE) The eGFR has been calculated using the CKD EPI equation. This calculation has not been validated in all clinical situations. eGFR's persistently <60 mL/min signify possible Chronic Kidney Disease.   Glucose, capillary     Status: Abnormal   Collection Time: 08/06/14  9:13 PM  Result Value Ref Range  Glucose-Capillary 145 (H) 70 - 99 mg/dL  CBC WITH DIFFERENTIAL     Status: Abnormal   Collection Time: 08/07/14  6:25 AM  Result Value Ref Range   WBC 12.0 (H) 4.0 - 10.5 K/uL   RBC 4.40 3.87 - 5.11 MIL/uL   Hemoglobin 12.5 12.0 - 15.0 g/dL   HCT 38.6 36.0 - 46.0 %   MCV 87.7 78.0 - 100.0 fL   MCH 28.4 26.0 - 34.0 pg   MCHC 32.4 30.0 - 36.0 g/dL   RDW 15.5 11.5 - 15.5 %   Platelets 226 150 - 400 K/uL   Neutrophils Relative % 70 43 - 77 %   Neutro Abs  8.3 (H) 1.7 - 7.7 K/uL   Lymphocytes Relative 19 12 - 46 %   Lymphs Abs 2.3 0.7 - 4.0 K/uL   Monocytes Relative 8 3 - 12 %   Monocytes Absolute 1.0 0.1 - 1.0 K/uL   Eosinophils Relative 2 0 - 5 %   Eosinophils Absolute 0.3 0.0 - 0.7 K/uL   Basophils Relative 1 0 - 1 %   Basophils Absolute 0.1 0.0 - 0.1 K/uL  Comprehensive metabolic panel     Status: Abnormal   Collection Time: 08/07/14  6:25 AM  Result Value Ref Range   Sodium 138 135 - 145 mmol/L   Potassium 4.3 3.5 - 5.1 mmol/L   Chloride 105 101 - 111 mmol/L   CO2 25 22 - 32 mmol/L   Glucose, Bld 150 (H) 65 - 99 mg/dL   BUN 13 6 - 20 mg/dL   Creatinine, Ser 0.81 0.44 - 1.00 mg/dL   Calcium 9.1 8.9 - 10.3 mg/dL   Total Protein 5.6 (L) 6.5 - 8.1 g/dL   Albumin 3.0 (L) 3.5 - 5.0 g/dL   AST 21 15 - 41 U/L   ALT 24 14 - 54 U/L   Alkaline Phosphatase 66 38 - 126 U/L   Total Bilirubin 0.8 0.3 - 1.2 mg/dL   GFR calc non Af Amer >60 >60 mL/min   GFR calc Af Amer >60 >60 mL/min    Comment: (NOTE) The eGFR has been calculated using the CKD EPI equation. This calculation has not been validated in all clinical situations. eGFR's persistently <60 mL/min signify possible Chronic Kidney Disease.    Anion gap 8 5 - 15  Glucose, capillary     Status: Abnormal   Collection Time: 08/07/14  6:45 AM  Result Value Ref Range   Glucose-Capillary 174 (H) 65 - 99 mg/dL  Glucose, capillary     Status: Abnormal   Collection Time: 08/07/14 11:31 AM  Result Value Ref Range   Glucose-Capillary 111 (H) 65 - 99 mg/dL   Comment 1 Notify RN   Glucose, capillary     Status: Abnormal   Collection Time: 08/07/14  4:53 PM  Result Value Ref Range   Glucose-Capillary 122 (H) 65 - 99 mg/dL  Glucose, capillary     Status: Abnormal   Collection Time: 08/07/14  8:56 PM  Result Value Ref Range   Glucose-Capillary 129 (H) 65 - 99 mg/dL  Glucose, capillary     Status: Abnormal   Collection Time: 08/08/14  6:46 AM  Result Value Ref Range   Glucose-Capillary  131 (H) 65 - 99 mg/dL  Glucose, capillary     Status: None   Collection Time: 08/08/14 11:29 AM  Result Value Ref Range   Glucose-Capillary 98 65 - 99 mg/dL   Comment 1 Notify RN  Glucose, capillary     Status: Abnormal   Collection Time: 08/08/14  4:22 PM  Result Value Ref Range   Glucose-Capillary 125 (H) 65 - 99 mg/dL   Comment 1 Notify RN   Glucose, capillary     Status: None   Collection Time: 08/08/14  8:43 PM  Result Value Ref Range   Glucose-Capillary 96 65 - 99 mg/dL  Glucose, capillary     Status: Abnormal   Collection Time: 08/09/14  6:36 AM  Result Value Ref Range   Glucose-Capillary 133 (H) 65 - 99 mg/dL      HEENT: Left sided neck incision with gauze dressing no swelling, minimal eccymosis Cardio: RRR and no murmur Resp: CTA B/L and unlabored GI: BS positive and NT, ND Extremity:  Pulses positive and No Edema Skin:   Bruise ecchymosis , left dorsum of hand Neuro: Alert/Oriented, Abnormal Sensory absent LT in LUE, Abnormal Motor 2- Left delt ,3- bi, 2- tri, 3-HF KE, Inattention and Other Left field cut Musc/Skel:  Swelling dorsum LEft hand improving, ecchymosis Gen NAD   Assessment/Plan: 1. Functional deficits secondary to Right MCA infarct with Left hemiparesis, left neglect and anosognsia which require 3+ hours per day of interdisciplinary therapy in a comprehensive inpatient rehab setting. Physiatrist is providing close team supervision and 24 hour management of active medical problems listed below. Physiatrist and rehab team continue to assess barriers to discharge/monitor patient progress toward functional and medical goals. FIM: FIM - Bathing Bathing Steps Patient Completed: Chest, Left Arm, Abdomen, Buttocks, Right upper leg, Right lower leg (including foot), Left upper leg, Left lower leg (including foot), Front perineal area Bathing: 4: Min-Patient completes 8-9 [id number] parts or 75+ percent  FIM - Upper Body Dressing/Undressing Upper body  dressing/undressing steps patient completed: Thread/unthread right sleeve of pullover shirt/dresss, Thread/unthread left sleeve of pullover shirt/dress, Put head through opening of pull over shirt/dress, Pull shirt over trunk (4/4 steps) Upper body dressing/undressing: 5: Supervision: Safety issues/verbal cues FIM - Lower Body Dressing/Undressing Lower body dressing/undressing steps patient completed: Thread/unthread right underwear leg, Thread/unthread left underwear leg, Thread/unthread right pants leg, Thread/unthread left pants leg Lower body dressing/undressing: 2: Max-Patient completed 25-49% of tasks  FIM - Toileting Toileting steps completed by patient: Performs perineal hygiene, Adjust clothing after toileting Toileting Assistive Devices: Grab bar or rail for support Toileting: 3: Mod-Patient completed 2 of 3 steps  FIM - TRadio producerDevices: Grab bars Toilet Transfers: 5-To toilet/BSC: Supervision (verbal cues/safety issues), 5-From toilet/BSC: Supervision (verbal cues/safety issues)  FIM - BIT sales professionalTransfer: 4: Bed > Chair or W/C: Min A (steadying Pt. > 75%), 4: Chair or W/C > Bed: Min A (steadying Pt. > 75%)  FIM - Locomotion: Wheelchair Distance: 60 Locomotion: Wheelchair: 2: Travels 50 - 149 ft with minimal assistance (Pt.>75%) FIM - Locomotion: Ambulation Locomotion: Ambulation Assistive Devices: CNurse, adultAmbulation/Gait Assistance: 4: Min assist Locomotion: Ambulation: 2: Travels 50 - 149 ft with minimal assistance (Pt.>75%)  Comprehension Comprehension Mode: Auditory Comprehension: 6-Follows complex conversation/direction: With extra time/assistive device  Expression Expression Mode: Verbal Expression: 5-Expresses complex 90% of the time/cues < 10% of the time  Social Interaction Social Interaction: 6-Interacts appropriately with others with medication or extra time (anti-anxiety, antidepressant).  Problem  Solving Problem Solving: 4-Solves basic 75 - 89% of the time/requires cueing 10 - 24% of the time  Memory Memory: 4-Recognizes or recalls 75 - 89% of the time/requires cueing 10 - 24% of the time  Medical Problem  List and Plan: 1. Functional deficits secondary to right MCA infarct 2.  DVT Prophylaxis/Anticoagulation: Subcutaneous Lovenox for DVT prophylaxis. Monitor for any bleeding episodes 3. Pain Management: Percocet as needed. Monitor with increased mobility 4. Right carotid stenosis. Status post carotid endarterectomy 08/05/2014 5. Neuropsych: This patient is capable of making decisions on her own behalf. 6. Skin/Wound Care: Routine skin checks 7. Fluids/Electrolytes/Nutrition:   I and O's with follow-up chemistries 8. Dysphagia. Dysphagia #2 sent liquids follow-up speech therapy, adequate po fluids d/c IV 9. Hypertension. Lisinopril 5 mg daily. M  10. Diabetes mellitus. Hemoglobin A1c 8.5. Glucophage 500 mg twice a day.   -blood sugars improving 11. Hypothyroidism. Synthroid 12. Hyperlipidemia. Lipitor 13. IV infiltration left hand. Conservative care 14. 6 mm pulmonary nodule identified on CT and GL chest. Recommendations follow-up CT of the chest in 6-12 months  LOS (Days) 3 A FACE TO FACE EVALUATION WAS PERFORMED  SWARTZ,ZACHARY T 08/09/2014, 7:10 AM

## 2014-08-09 NOTE — Progress Notes (Signed)
Occupational Therapy Session Note  Patient Details  Name: Nahdia Doucet MRN: [id number] Date of Birth: 02-23-1951  Today's Date: 08/09/2014 OT Individual Time: 1405-1500 OT Individual Time Calculation (min): 55 min    Short Term Goals: Week 1:  OT Short Term Goal 1 (Week 1): Pt will bathe with min A. OT Short Term Goal 1 - Progress (Week 1): Met OT Short Term Goal 2 (Week 1): Pt will toilet with min A. OT Short Term Goal 2 - Progress (Week 1): Progressing toward goal OT Short Term Goal 3 (Week 1): Pt will don shirt min A. OT Short Term Goal 3 - Progress (Week 1): Progressing toward goal OT Short Term Goal 4 (Week 1): Pt will don pants with mod A. OT Short Term Goal 4 - Progress (Week 1): Met OT Short Term Goal 5 (Week 1): Pt will be independent with self ROM of LUE. OT Short Term Goal 5 - Progress (Week 1): Progressing toward goal Week 2:  OT Short Term Goal 1 (Week 2): Pt will complete a stand pivot transfer to toilet with steadying A. OT Short Term Goal 2 (Week 2): Pt will don a tshirt with S. OT Short Term Goal 3 (Week 2): Pt will be able to don B feet into underwear and pants without A from a seated position. OT Short Term Goal 4 (Week 2): Pt will bathe with S using a long sponge to reach R arm. OT Short Term Goal 5 (Week 2): Pt will stand at sink with steadying A to brush teeth.  Skilled Therapeutic Interventions/Progress Updates:    Pt seen this session for RUE NMR with A/AROM for proximal movements of shoulder and estim for wrist fingers. Coban wrapping that was applied yesterday morning removed to check hand. Some edema has decreased in dorsal hand with good improvement in fingers. Pt's hand placed on handled of cane with support as she worked on pushing cane front/ back for active shoulder movement, improved AROM in both elbow and shoulder. Estim applied to wrist and finger extensors 10 min at intensity 25, with pt working on actively trying to extend fingers with stimulation.  Repeated for finger flexors for 10 min at intensity 23 with assist for further ROM.  No active movement after estim, but pt stated she could feel the stimulation and was encouraged to see her fingers move with the external help.  Coban wrap reapplied to each finger and hand. Pt worked on dynamic sitting in chair focusing on postural alignment and back extension strength. Sitting in recliner at end of session with all needs met.  Therapy Documentation Precautions:  Precautions Precautions: Fall Precaution Comments: severe L sided inattention Restrictions Weight Bearing Restrictions: No   Pain: Pain Assessment Pain Assessment: No/denies pain ADL:  See FIM for current functional status  Therapy/Group: Individual Therapy  [place] 08/09/2014, 5:12 PM

## 2014-08-10 ENCOUNTER — Inpatient Hospital Stay (HOSPITAL_COMMUNITY): Payer: BLUE CROSS/BLUE SHIELD | Admitting: Occupational Therapy

## 2014-08-10 ENCOUNTER — Ambulatory Visit (HOSPITAL_COMMUNITY): Payer: BLUE CROSS/BLUE SHIELD | Admitting: Occupational Therapy

## 2014-08-10 LAB — GLUCOSE, CAPILLARY
GLUCOSE-CAPILLARY: 132 mg/dL — AB (ref 65–99)
Glucose-Capillary: 90 mg/dL (ref 65–99)
Glucose-Capillary: 99 mg/dL (ref 65–99)
Glucose-Capillary: 106 mg/dL — ABNORMAL HIGH (ref 65–99)

## 2014-08-10 NOTE — Progress Notes (Signed)
64 y.o. right handed female with history of tobacco abuse, hypertension. Patient lives alone independently prior to admission and working full time. Admitted 07/24/2014 after a fall noting left-sided weakness. No head trauma no loss of consciousness. MRI of the brain shows multifocal patchy acute right MCA territory infarcts. Additional 8 mm cortical infarct within the parasagittal cortical gray matter of the anterior right frontal lobe, likely right ACA distribution. MRA of the head with right MCA M2 inferior division occlusion. CT angiogram chest negative except for a 6 mm pulmonary nodule on the right with recommendation follow-up CT the chest in 6-12 months. Elevated troponin 4.17. Echocardiogram with ejection fraction of 70% and grade 1 diastolic dysfunction. Carotid Doppler with left 50-60% and right 80% ICA stenosis. Patient did not receive TPA. Vascular surgery Dr. Oneida Alar consulted in relation to ICA stenosis plan follow-up as an outpatient to proceed with possible CEA. Follow-up cardiology for elevated troponin not felt to be due to ACS per Dr. Burt Knack and no further workup was indicated. Neurology consulted presently on aspirin and Plavix for CVA prophylaxis for 3 months then Plavix alone. Subcutaneous heparin for DVT prophylaxis. Findings of elevated hemoglobin A1c of 8.5 and placed on Glucophage with sliding scale insulin. Dysphagia 2 nectar thick liquid diet with follow per speech therapy. [place] and occupational l therapy evaluations completed with recommendations of physical medicine rehabilitation consult. Patient was admitted for a comprehensive rehabilitation program 07/30/2014. She was requiring mod max assist for mobility and transfers some visual attention issues on the left. Arrangements were made by vascular surgery for right CEA she was discharged from acute care services underwent right carotid surgery 08/05/2014 per Dr. Oneida Alar  Subjective/Complaints: Right forearm and wrist sore from  bruising. Left arm better. Overall doing well. Pleased with progress. Review of Systems - Negative except Left arm weak   Objective: Vital Signs: Blood pressure 127/64, pulse 82, temperature 98.2 F (36.8 C), temperature source Oral, resp. rate 18, height 5\' 2"  (1.575 m), weight 94.303 kg (207 lb 14.4 oz), SpO2 96 %. No results found. Results for orders placed or performed during the hospital encounter of 08/06/14 (from the past 72 hour(s))  Glucose, capillary     Status: Abnormal   Collection Time: 08/07/14 11:31 AM  Result Value Ref Range   Glucose-Capillary 111 (H) 65 - 99 mg/dL   Comment 1 Notify RN   Glucose, capillary     Status: Abnormal   Collection Time: 08/07/14  4:53 PM  Result Value Ref Range   Glucose-Capillary 122 (H) 65 - 99 mg/dL  Glucose, capillary     Status: Abnormal   Collection Time: 08/07/14  8:56 PM  Result Value Ref Range   Glucose-Capillary 129 (H) 65 - 99 mg/dL  Glucose, capillary     Status: Abnormal   Collection Time: 08/08/14  6:46 AM  Result Value Ref Range   Glucose-Capillary 131 (H) 65 - 99 mg/dL  Glucose, capillary     Status: None   Collection Time: 08/08/14 11:29 AM  Result Value Ref Range   Glucose-Capillary 98 65 - 99 mg/dL   Comment 1 Notify RN   Glucose, capillary     Status: Abnormal   Collection Time: 08/08/14  4:22 PM  Result Value Ref Range   Glucose-Capillary 125 (H) 65 - 99 mg/dL   Comment 1 Notify RN   Glucose, capillary     Status: None   Collection Time: 08/08/14  8:43 PM  Result Value Ref Range   Glucose-Capillary 96 65 -  99 mg/dL  Glucose, capillary     Status: Abnormal   Collection Time: 08/09/14  6:36 AM  Result Value Ref Range   Glucose-Capillary 133 (H) 65 - 99 mg/dL  Glucose, capillary     Status: Abnormal   Collection Time: 08/09/14 11:15 AM  Result Value Ref Range   Glucose-Capillary 121 (H) 65 - 99 mg/dL  Glucose, capillary     Status: Abnormal   Collection Time: 08/09/14  4:39 PM  Result Value Ref Range    Glucose-Capillary 107 (H) 65 - 99 mg/dL  Glucose, capillary     Status: Abnormal   Collection Time: 08/09/14  9:14 PM  Result Value Ref Range   Glucose-Capillary 113 (H) 65 - 99 mg/dL  Glucose, capillary     Status: Abnormal   Collection Time: 08/10/14  6:30 AM  Result Value Ref Range   Glucose-Capillary 132 (H) 65 - 99 mg/dL      HEENT: Left sided neck incision with gauze dressing no swelling, minimal eccymosis Cardio: RRR and no murmur Resp: CTA B/L and unlabored GI: BS positive and NT, ND Extremity:  Pulses positive and No Edema Skin:   Bruise ecchymosis , left dorsum of hand as well as right forearm Neuro: Alert/Oriented, Abnormal Sensory absent LT in LUE, Abnormal Motor 2- Left delt ,3- bi, 2- tri, 3-HF KE, Inattention and Other Left field cut Musc/Skel:  Swelling dorsum LEft hand improving, ecchymosis--coban wrap in place Gen NAD   Assessment/Plan: 1. Functional deficits secondary to Right MCA infarct with Left hemiparesis, left neglect and anosognsia which require 3+ hours per day of interdisciplinary therapy in a comprehensive inpatient rehab setting. Physiatrist is providing close team supervision and 24 hour management of active medical problems listed below. Physiatrist and rehab team continue to assess barriers to discharge/monitor patient progress toward functional and medical goals. FIM: FIM - Bathing Bathing Steps Patient Completed: Chest, Left Arm, Abdomen, Buttocks, Right upper leg, Right lower leg (including foot), Left upper leg, Left lower leg (including foot), Front perineal area Bathing: 4: Min-Patient completes 8-9 59f 10 parts or 75+ percent  FIM - Upper Body Dressing/Undressing Upper body dressing/undressing steps patient completed: Thread/unthread right sleeve of pullover shirt/dresss, Thread/unthread left sleeve of pullover shirt/dress, Put head through opening of pull over shirt/dress, Pull shirt over trunk (4/4 steps) Upper body dressing/undressing: 5:  Supervision: Safety issues/verbal cues FIM - Lower Body Dressing/Undressing Lower body dressing/undressing steps patient completed: Thread/unthread right underwear leg, Thread/unthread left underwear leg, Thread/unthread right pants leg, Thread/unthread left pants leg Lower body dressing/undressing: 2: Max-Patient completed 25-49% of tasks  FIM - Toileting Toileting steps completed by patient: Performs perineal hygiene, Adjust clothing after toileting Toileting Assistive Devices: Grab bar or rail for support Toileting: 3: Mod-Patient completed 2 of 3 steps  FIM - Radio producer Devices: Grab bars Toilet Transfers: 5-To toilet/BSC: Supervision (verbal cues/safety issues), 5-From toilet/BSC: Supervision (verbal cues/safety issues)  FIM - IT sales professional Transfer: 4: Bed > Chair or W/C: Min A (steadying Pt. > 75%), 4: Chair or W/C > Bed: Min A (steadying Pt. > 75%)  FIM - Locomotion: Wheelchair Distance: 60 Locomotion: Wheelchair: 2: Travels 50 - 149 ft with minimal assistance (Pt.>75%) FIM - Locomotion: Ambulation Locomotion: Ambulation Assistive Devices: Nurse, adult Ambulation/Gait Assistance: 4: Min assist Locomotion: Ambulation: 2: Travels 50 - 149 ft with minimal assistance (Pt.>75%)  Comprehension Comprehension Mode: Auditory Comprehension: 6-Follows complex conversation/direction: With extra time/assistive device  Expression Expression Mode: Verbal Expression: 5-Expresses complex 90%  of the time/cues < 10% of the time  Social Interaction Social Interaction: 6-Interacts appropriately with others with medication or extra time (anti-anxiety, antidepressant).  Problem Solving Problem Solving: 4-Solves basic 75 - 89% of the time/requires cueing 10 - 24% of the time  Memory Memory: 4-Recognizes or recalls 75 - 89% of the time/requires cueing 10 - 24% of the time  Medical Problem List and Plan: 1. Functional deficits secondary to right  MCA infarct 2.  DVT Prophylaxis/Anticoagulation: Subcutaneous Lovenox for DVT prophylaxis. Monitor for any bleeding episodes 3. Pain Management: Percocet as needed. Monitor with increased mobility 4. Right carotid stenosis. Status post carotid endarterectomy 08/05/2014 5. Neuropsych: This patient is capable of making decisions on her own behalf. 6. Skin/Wound Care: Routine skin checks 7. Fluids/Electrolytes/Nutrition:   I and O's with follow-up chemistries 8. Dysphagia. Dysphagia #2 sent liquids follow-up speech therapy, adequate po fluid intake 9. Hypertension. Lisinopril 5 mg daily. M  10. Diabetes mellitus. Hemoglobin A1c 8.5. Glucophage 500 mg twice a day.   -blood sugars well controlled 11. Hypothyroidism. Synthroid 12. Hyperlipidemia. Lipitor 13. IV infiltration left hand. Conservative care/wrapping  14. 6 mm pulmonary nodule identified on CT and GL chest. Recommendations follow-up CT of the chest in 6-12 months  LOS (Days) 4 A FACE TO FACE EVALUATION WAS PERFORMED  Andriana Casa T 08/10/2014, 6:47 AM

## 2014-08-10 NOTE — Progress Notes (Signed)
Occupational Therapy Session Note  Patient Details  Name: Carla Hopkins MRN: [id number] Date of Birth: April 13, 1950  Today's Date: 08/10/2014 OT Individual Time: 1300-1400 OT Individual Time Calculation (min): 60 min   Skilled Therapeutic Interventions/Progress Updates:    Pt donned shoes and socks sitting EOC to begin session.  She was able to donn the left sock with supervision but needed min assist on the right, with use of just the RUE.  Mod assist for donning the left shoe over her heel with supervision for the right.  Total assist needed to tie them both.  Pt with some impulsivity noted during the task as well as she was attempting to donn the sock on the right foot before completely getting her left heel in her shoe.  She was able to then transfer stand pivot to the wheelchair for transition down to the therapy gym with min facilitation.  In the gym therapist unwrapped coban on left hand and digits.  Decreased swelling noted in the left digits but pt still with significant swelling and hematoma in the left hand region.  Worked on simple weightbearing activities in sitting with emphasis on active left elbow extension and shoulder stability.  Pt with some increased pain in anterior biceps region with full extension.  Pt needing mod instructional cueing to maintain visual attention to the LUE when attempting to move tilted stool.  She was able to move the stool but had difficulty maintaining it secondary to attention.  Therapist re-applied coban to hand to help reduce swelling but did not re-apply to digits.  Returned to room and applied resting hand splint as well and positioned LUE on arm trough with folded and taped towels to position to hand above the elbow.    Therapy Documentation Precautions:  Precautions Precautions: Fall Precaution Comments: severe L sided inattention Restrictions Weight Bearing Restrictions: No  Pain: Pain Assessment Pain Assessment: Faces Faces Pain Scale: Hurts a  little bit Pain Type: Acute pain Pain Location: Hand Pain Orientation: Left Pain Intervention(s): Repositioned ADL: See FIM for current functional status  Therapy/Group: Individual Therapy  Nethan Caudillo OTR/L 08/10/2014, 3:43 PM

## 2014-08-11 ENCOUNTER — Inpatient Hospital Stay (HOSPITAL_COMMUNITY): Payer: BLUE CROSS/BLUE SHIELD | Admitting: Speech Pathology

## 2014-08-11 ENCOUNTER — Inpatient Hospital Stay (HOSPITAL_COMMUNITY): Payer: BLUE CROSS/BLUE SHIELD | Admitting: Occupational Therapy

## 2014-08-11 ENCOUNTER — Inpatient Hospital Stay (HOSPITAL_COMMUNITY): Payer: BLUE CROSS/BLUE SHIELD

## 2014-08-11 LAB — GLUCOSE, CAPILLARY
GLUCOSE-CAPILLARY: 129 mg/dL — AB (ref 65–99)
GLUCOSE-CAPILLARY: 100 mg/dL — AB (ref 65–99)
Glucose-Capillary: 134 mg/dL — ABNORMAL HIGH (ref 65–99)
Glucose-Capillary: 108 mg/dL — ABNORMAL HIGH (ref 65–99)

## 2014-08-11 MED ORDER — METHOCARBAMOL 500 MG PO TABS
500.0000 mg | ORAL_TABLET | Freq: Four times a day (QID) | ORAL | Status: DC | PRN
  Administered 2014-08-11 – 2014-08-12 (×3): 500 mg via ORAL
  Filled 2014-08-11 (×3): qty 1

## 2014-08-11 NOTE — Progress Notes (Signed)
Occupational Therapy Session Note  Patient Details  Name: Carla Hopkins MRN: [id number] Date of Birth: 05/24/1950  Today's Date: 08/11/2014 OT Individual Time: 0800-0900 OT Individual Time Calculation (min): 60 min    Short Term Goals: Week 1:  OT Short Term Goal 1 (Week 1): Pt will bathe with min A. OT Short Term Goal 1 - Progress (Week 1): Met OT Short Term Goal 2 (Week 1): Pt will toilet with min A. OT Short Term Goal 2 - Progress (Week 1): Progressing toward goal OT Short Term Goal 3 (Week 1): Pt will don shirt min A. OT Short Term Goal 3 - Progress (Week 1): Progressing toward goal OT Short Term Goal 4 (Week 1): Pt will don pants with mod A. OT Short Term Goal 4 - Progress (Week 1): Met OT Short Term Goal 5 (Week 1): Pt will be independent with self ROM of LUE. OT Short Term Goal 5 - Progress (Week 1): Progressing toward goal Week 2:  OT Short Term Goal 1 (Week 2): Pt will complete a stand pivot transfer to toilet with steadying A. OT Short Term Goal 2 (Week 2): Pt will don a tshirt with S. OT Short Term Goal 3 (Week 2): Pt will be able to don B feet into underwear and pants without A from a seated position. OT Short Term Goal 4 (Week 2): Pt will bathe with S using a long sponge to reach R arm. OT Short Term Goal 5 (Week 2): Pt will stand at sink with steadying A to brush teeth.  Skilled Therapeutic Interventions/Progress Updates:      Pt seen for BADL retraining of toileting, bathing, and dressing with a focus on dynamic balance, visual perceptual skills, and active movement of proximal L arm. Coban wrap removed from hand with decreased edema. Pt ambulated around room with cane with min A and min cues for L side awareness when going through tight spaces.  In shower pt used long handled sponge to wash R arm and feet. For dressing, needed full assist with sports bra due to spatial awareness deficits and body girth. She is able to don tshirt with mod cues for orienting shirt and fully  attending to L arm. She did actively lift arm to don shirt and to wash under her arm. During LB dressing, pt demonstrates excellent progress with dynamic balance and spatial awareness skill to don clothing over feet. Continues to need A with pants over hips to due body girth.  Pt's hand rewrapped in coban and pt immediately noticed active movement in [name] of fingers. Pt very excited to see this movement. Pt then engaged in a/arom exercises to increase digit flexion and table top towel slides focusing on shoulder and elbow AROM. Now has 100 degrees of elbow flexion in gravity eliminated position. Pt set up with breakfast tray sitting in recliner with all needs met.  Therapy Documentation Precautions:  Precautions Precautions: Fall Precaution Comments: severe L sided inattention Restrictions Weight Bearing Restrictions: No Vital Signs: Therapy Vitals Temp: 98.4 F (36.9 C) Temp Source: Oral Pulse Rate: 90 Resp: 18 BP: 121/60 mmHg Patient Position (if appropriate): Lying Oxygen Therapy SpO2: 96 % O2 Device: Not Delivered Pain: Pain Assessment Pain Assessment: No/denies pain ADL:  See FIM for current functional status  Therapy/Group: Individual Therapy  [place] 08/11/2014, 9:31 AM

## 2014-08-11 NOTE — Progress Notes (Addendum)
64 y.o. right handed female with history of tobacco abuse, hypertension. Patient lives alone independently prior to admission and working full time. Admitted 07/24/2014 after a fall noting left-sided weakness. No head trauma no loss of consciousness. MRI of the brain shows multifocal patchy acute right MCA territory infarcts underwent right carotid surgery 08/05/2014 per Dr. Oneida Alar  Subjective/Complaints: Pt without hand or finger pain, moving left arm better Review of Systems - Negative except Left arm weak   Objective: Vital Signs: Blood pressure 116/55, pulse 90, temperature 98.4 F (36.9 C), temperature source Oral, resp. rate 18, height 5\' 2"  (1.575 m), weight 94.303 kg (207 lb 14.4 oz), SpO2 96 %. No results found. Results for orders placed or performed during the hospital encounter of 08/06/14 (from the past 72 hour(s))  Glucose, capillary     Status: None   Collection Time: 08/08/14 11:29 AM  Result Value Ref Range   Glucose-Capillary 98 65 - 99 mg/dL   Comment 1 Notify RN   Glucose, capillary     Status: Abnormal   Collection Time: 08/08/14  4:22 PM  Result Value Ref Range   Glucose-Capillary 125 (H) 65 - 99 mg/dL   Comment 1 Notify RN   Glucose, capillary     Status: None   Collection Time: 08/08/14  8:43 PM  Result Value Ref Range   Glucose-Capillary 96 65 - 99 mg/dL  Glucose, capillary     Status: Abnormal   Collection Time: 08/09/14  6:36 AM  Result Value Ref Range   Glucose-Capillary 133 (H) 65 - 99 mg/dL  Glucose, capillary     Status: Abnormal   Collection Time: 08/09/14 11:15 AM  Result Value Ref Range   Glucose-Capillary 121 (H) 65 - 99 mg/dL  Glucose, capillary     Status: Abnormal   Collection Time: 08/09/14  4:39 PM  Result Value Ref Range   Glucose-Capillary 107 (H) 65 - 99 mg/dL  Glucose, capillary     Status: Abnormal   Collection Time: 08/09/14  9:14 PM  Result Value Ref Range   Glucose-Capillary 113 (H) 65 - 99 mg/dL  Glucose, capillary     Status:  Abnormal   Collection Time: 08/10/14  6:30 AM  Result Value Ref Range   Glucose-Capillary 132 (H) 65 - 99 mg/dL  Glucose, capillary     Status: None   Collection Time: 08/10/14 11:31 AM  Result Value Ref Range   Glucose-Capillary 90 65 - 99 mg/dL  Glucose, capillary     Status: None   Collection Time: 08/10/14  4:34 PM  Result Value Ref Range   Glucose-Capillary 99 65 - 99 mg/dL  Glucose, capillary     Status: Abnormal   Collection Time: 08/10/14  8:41 PM  Result Value Ref Range   Glucose-Capillary 106 (H) 65 - 99 mg/dL  Glucose, capillary     Status: Abnormal   Collection Time: 08/11/14  6:50 AM  Result Value Ref Range   Glucose-Capillary 129 (H) 65 - 99 mg/dL      HEENT: Left sided neck incision with gauze dressing no swelling, minimal eccymosis Cardio: RRR and no murmur Resp: CTA B/L and unlabored GI: BS positive and NT, ND Extremity:  Pulses positive and No Edema Skin:   Bruise ecchymosis , left dorsum of hand as well as right forearm Neuro: Alert/Oriented, Abnormal Sensory absent LT in LUE, Abnormal Motor 3- Left delt ,3- bi, 2- tri, , 0/5 in Left hand3-HF KE, Inattention and Other Left field cut Musc/Skel:  Swelling dorsum LEft hand improving, ecchymosis--coban wrap in place Gen NAD   Assessment/Plan: 1. Functional deficits secondary to Right MCA infarct with Left hemiparesis, left neglect and anosognsia which require 3+ hours per day of interdisciplinary therapy in a comprehensive inpatient rehab setting. Physiatrist is providing close team supervision and 24 hour management of active medical problems listed below. Physiatrist and rehab team continue to assess barriers to discharge/monitor patient progress toward functional and medical goals. FIM: FIM - Bathing Bathing Steps Patient Completed: Chest, Left Arm, Abdomen, Buttocks, Right upper leg, Right lower leg (including foot), Left upper leg, Left lower leg (including foot), Front perineal area Bathing: 4: Min-Patient  completes 8-9 62f 10 parts or 75+ percent  FIM - Upper Body Dressing/Undressing Upper body dressing/undressing steps patient completed: Thread/unthread right sleeve of pullover shirt/dresss, Thread/unthread left sleeve of pullover shirt/dress, Put head through opening of pull over shirt/dress, Pull shirt over trunk (4/4 steps) Upper body dressing/undressing: 5: Supervision: Safety issues/verbal cues FIM - Lower Body Dressing/Undressing Lower body dressing/undressing steps patient completed: Thread/unthread right underwear leg, Thread/unthread left underwear leg, Thread/unthread right pants leg, Thread/unthread left pants leg Lower body dressing/undressing: 2: Max-Patient completed 25-49% of tasks  FIM - Toileting Toileting steps completed by patient: Performs perineal hygiene, Adjust clothing after toileting Toileting Assistive Devices: Grab bar or rail for support Toileting: 3: Mod-Patient completed 2 of 3 steps  FIM - Radio producer Devices: Grab bars Toilet Transfers: 5-To toilet/BSC: Supervision (verbal cues/safety issues), 5-From toilet/BSC: Supervision (verbal cues/safety issues)  FIM - IT sales professional Transfer: 4: Bed > Chair or W/C: Min A (steadying Pt. > 75%), 4: Chair or W/C > Bed: Min A (steadying Pt. > 75%)  FIM - Locomotion: Wheelchair Distance: 60 Locomotion: Wheelchair: 2: Travels 50 - 149 ft with minimal assistance (Pt.>75%) FIM - Locomotion: Ambulation Locomotion: Ambulation Assistive Devices: Nurse, adult Ambulation/Gait Assistance: 4: Min assist Locomotion: Ambulation: 2: Travels 50 - 149 ft with minimal assistance (Pt.>75%)  Comprehension Comprehension Mode: Auditory Comprehension: 6-Follows complex conversation/direction: With extra time/assistive device  Expression Expression Mode: Verbal Expression: 5-Expresses complex 90% of the time/cues < 10% of the time  Social Interaction Social Interaction: 6-Interacts  appropriately with others with medication or extra time (anti-anxiety, antidepressant).  Problem Solving Problem Solving: 4-Solves basic 75 - 89% of the time/requires cueing 10 - 24% of the time  Memory Memory: 4-Recognizes or recalls 75 - 89% of the time/requires cueing 10 - 24% of the time  Medical Problem List and Plan: 1. Functional deficits secondary to right MCA infarct 2.  DVT Prophylaxis/Anticoagulation: Subcutaneous Lovenox for DVT prophylaxis. Monitor for any bleeding episodes 3. Pain Management: Percocet as needed. Monitor with increased mobility 4. Right carotid stenosis. Status post carotid endarterectomy 08/05/2014 5. Neuropsych: This patient is capable of making decisions on her own behalf. 6. Skin/Wound Care: Routine skin checks 7. Fluids/Electrolytes/Nutrition:   I and O's with follow-up chemistries 8. Dysphagia. Dysphagia #2 sent liquids follow-up speech therapy, adequate po fluid intake 9. Hypertension. Lisinopril 5 mg daily. M  10. Diabetes mellitus. Hemoglobin A1c 8.5. Glucophage 500 mg twice a day.   -blood sugars well controlled 11. Hypothyroidism. Synthroid 12. Hyperlipidemia. Lipitor 13. IV infiltration left hand. Conservative care/wrapping  14. 6 mm pulmonary nodule identified on CT and GL chest. Recommendations follow-up CT of the chest in 6-12 months  LOS (Days) 5 A FACE TO FACE EVALUATION WAS PERFORMED  Carla Hopkins E 08/11/2014, 6:55 AM

## 2014-08-11 NOTE — Progress Notes (Signed)
Physical Therapy Session Note  Patient Details  Name: Carla Hopkins MRN: [id number] Date of Birth: Jun 29, 1950  Today's Date: 08/11/2014 PT Individual Time: 1005-1105 PT Individual Time Calculation (min): 60 min   Short Term Goals: Week 2:  PT Short Term Goal 1 (Week 2): pt will perform basic transfers to R and L with supervision, 2 or less cues for safety PT Short Term Goal 2 (Week 2): pt will perform gait x 100' controlled setting, with moderate assistance, LRAD PT Short Term Goal 3 (Week 2): pt will ascend and descend 4 steps with 1 rail with supervision , 2 or less cues for safety PT Short Term Goal 4 (Week 2): pt will perform gait x 100' in community setting, with supervision /min guard assistance, 4 or less cues for avoiding obstacles  Skilled Therapeutic Interventions/Progress Updates:   Ulice Dash Fusion w/c cushion placed in chair for greater support and comfort.  Recliner>w/c with mod assist to L due to LOB L. Sit> supine with min assist for LLE due to L neglect. Supine> R sidelying> sit with min assist for trunk elevation, cues for LLE. W/c>< mat with min/mod assist to L, min assist to R.   Pt had brief vertigo during sit>supine; she had vertigo rarely PTA.  She has not received tx for this.  neuromuscular re-education via forced use, visual feedback, manual and VCS for: -L shoulder extension in supported sitting to gslide items off of table -L hip stability in standing with L and R sidestepping for targets to L and R -L hip activation during L LE unilateral bridging in supine with LLE off side of mat -core activation during bil bridging in supine, football between knees -L visual attention to lock L w/c brake with L hand with assistance, and move L foot off legrest in preparation for transfer  Gait with NBQC x 60' with mod assist for balance, L hip stability, mod cues for sequencing, = step lengths, upright trunk and forward gaze.  Pt tends to walk unsafely fast, and has minimal  clearance L foot. She continues with marked Trendelenburg.   Pt returned to room, resting in w/c with all needs within reach.    Therapy Documentation Precautions:  Precautions Precautions: Fall Precaution Comments: severe L sided inattention Restrictions Weight Bearing Restrictions: No General:   Vital Signs: Therapy Vitals Temp: 97.8 F (36.6 C) Temp Source: Oral Pulse Rate: 86 Resp: 18 BP: (!) 143/60 mmHg Patient Position (if appropriate): Sitting Oxygen Therapy SpO2: 93 % O2 Device: Not Delivered Pain: Pain Assessment Pain Assessment: Faces Pain Score: 0-No pain Faces Pain Scale: Hurts whole lot Pain Location: Arm Pain Orientation: Left Pain Descriptors / Indicators: Contraction Pain Onset: With Activity Pain Intervention(s): Medication (See eMAR);Repositioned;Emotional support Multiple Pain Sites: No Mobility:   Locomotion :    Trunk/Postural Assessment :    Balance:   Exercises:   Other Treatments:    See FIM for current functional status  Therapy/Group: Individual Therapy  Paeton Studer,[place] 08/11/2014, 4:55 PM

## 2014-08-11 NOTE — Progress Notes (Signed)
Occupational Therapy Session Note  Patient Details  Name: Carla Hopkins MRN: [id number] Date of Birth: 27-Mar-1951  Today's Date: 08/11/2014 OT Individual Time: 1500-1529 OT Individual Time Calculation (min): 29 min   Skilled Therapeutic Interventions/Progress Updates:    Pt with increased spasms in the LUE during session.  Pt with significant pain with any PROM at the digits or just positioning the LUE on the arm trough.  Took pt down to the gym and notified nursing for needs of pain meds and possible muscle relaxer.  She was able to transfer from wheelchair to [facility] with min guard assist but needed mod instructional cueing to not stand up as wheelchair foot rests had not been moved yet.  Placed pt in sidelying on the right side and worked on scapular mobilizations.  Pt with good range of mobility in adduction, depression.  She was able to actively perform these movements as well.  Transitioned to supine with pt working on raising arm up toward the ceiling.  She was able to demonstrate shoulder flexion and elbow extension but could not achieve and maintain full elbow extension or shoulder flexion past 80 degrees.  Pt with increased pain at the hand radiating up the arm if extension, or flexion of digits performed as well as when performing full AAROM elbow extension.  Pt returned to room at end of session and transferred to recliner.  Nursing came in during session and stated muscle relaxer had been ordered but was not on the floor to administer yet.  Positioned the LUE on pillows with coban wrapping still noted at the dorsal and palmar hand.    Therapy Documentation Precautions:  Precautions Precautions: Fall Precaution Comments: severe L sided inattention Restrictions Weight Bearing Restrictions: No  Vital Signs: Therapy Vitals Temp: 97.8 F (36.6 C) Temp Source: Oral Pulse Rate: 86 Resp: 18 BP: (!) 143/60 mmHg Patient Position (if appropriate): Sitting Oxygen Therapy SpO2: 93 % O2  Device: Not Delivered Pain: Pain Assessment Pain Assessment: Faces Pain Score: 0-No pain Faces Pain Scale: Hurts whole lot Pain Location: Arm Pain Orientation: Left Pain Descriptors / Indicators: Contraction Pain Onset: With Activity Pain Intervention(s): Medication (See eMAR);Repositioned;Emotional support Multiple Pain Sites: No ADL: See FIM for current functional status  Therapy/Group: Individual Therapy  Matisse Roskelley OTR/L 08/11/2014, 4:53 PM

## 2014-08-11 NOTE — Progress Notes (Signed)
Patient complaining of muscle spasms in her Left arm which is causing involuntary movement of the appendage.  Writer spoke with D. [place], PA and patient was placed on robaxin 500mg  q6h prn. The first dose was given at 1620. AT 1730 patient reported some relief but still having small spasms.

## 2014-08-11 NOTE — Progress Notes (Signed)
Speech Language Pathology Daily Session Note  Patient Details  Name: Carla Hopkins MRN: [id number] Date of Birth: [birth year]  Today's Date: 08/11/2014 SLP Individual Time: 1300-1400 SLP Individual Time Calculation (min): 60 min  Short Term Goals: Week 1: SLP Short Term Goal 1 (Week 1): Pt will attend to left body/environment during semi-complex functional tasks with min assist verbal cues SLP Short Term Goal 2 (Week 1): Pt will complete semi-complex tasks with min assist verbal cues for functional problem solving.   SLP Short Term Goal 3 (Week 1): Pt will return demonstration of oral motor strengthening exercises targeting left sided weakness with supervision.  SLP Short Term Goal 4 (Week 1): Pt will utilize slow rate and overarticulation to achieve intelligibility in conversations with supervision.  SLP Short Term Goal 5 (Week 1): Pt will consume regular textures and thin liquids with mod I self monitoring and correction of left sided buccal residue.    Skilled Therapeutic Interventions:  Pt was seen for skilled ST targeting cognitive goals.  Upon arrival, pt was seated upright in wheelchair, awake, alert, and agreeable to participate in [place].  SLP facilitated the session with an internet shopping task targeting use of left attention strategies during a personally meaningful activity.  Pt required mod faded to min assist verbal and visual cues to locate items that were on the left side of her computer screen.  SLP also facilitated the session with min assist verbal and visual cues for error awareness when typing words into the address bar or search engine.  As task became more difficult, pt became increasingly more difficult to engage in task and reported that she was not in the mood for shopping.  Pt endorsed that she frequently checked her email prior to admission; therefore, SLP modified task for use of visual scanning techniques while deleting spam emails.  Pt scanned to the left of midline with  overall min assist faded to supervision cues.  Continue per current plan of care.    FIM:  Comprehension Comprehension Mode: Auditory Comprehension: 6-Follows complex conversation/direction: With extra time/assistive device Expression Expression Mode: Verbal Expression: 5-Expresses complex 90% of the time/cues < 10% of the time Social Interaction Social Interaction: 6-Interacts appropriately with others with medication or extra time (anti-anxiety, antidepressant). Problem Solving Problem Solving: 4-Solves basic 75 - 89% of the time/requires cueing 10 - 24% of the time Memory Memory: 4-Recognizes or recalls 75 - 89% of the time/requires cueing 10 - 24% of the time  Pain Pain Assessment Pain Assessment: No/denies pain Pain Score: 0-No pain  Therapy/Group: Individual Therapy  Carla Hopkins, Selinda Orion 08/11/2014, 4:06 PM

## 2014-08-12 ENCOUNTER — Inpatient Hospital Stay (HOSPITAL_COMMUNITY): Payer: BLUE CROSS/BLUE SHIELD

## 2014-08-12 ENCOUNTER — Inpatient Hospital Stay (HOSPITAL_COMMUNITY): Payer: BLUE CROSS/BLUE SHIELD | Admitting: Speech Pathology

## 2014-08-12 ENCOUNTER — Inpatient Hospital Stay (HOSPITAL_COMMUNITY): Payer: BLUE CROSS/BLUE SHIELD | Admitting: Occupational Therapy

## 2014-08-12 DIAGNOSIS — M79642 Pain in left hand: Secondary | ICD-10-CM

## 2014-08-12 LAB — GLUCOSE, CAPILLARY
GLUCOSE-CAPILLARY: 127 mg/dL — AB (ref 65–99)
GLUCOSE-CAPILLARY: 106 mg/dL — AB (ref 65–99)
Glucose-Capillary: 106 mg/dL — ABNORMAL HIGH (ref 65–99)
Glucose-Capillary: 108 mg/dL — ABNORMAL HIGH (ref 65–99)

## 2014-08-12 NOTE — Progress Notes (Signed)
[facility]  Subjective  - feels ok   Objective 109/58 77 98 F (36.7 C) (Oral) 18 96%  Intake/Output Summary (Last 24 hours) at 08/12/14 0954 Last data filed at 08/12/14 0845  Gross per 24 hour  Intake    600 ml  Output      0 ml  Net    600 ml   Right neck incision healing Still has significant left arm deficit No new neuro events or deficits  Assessment/Planning: Doing well s/p Right CEA will arrange outpt follow up  Ruta Hinds 08/12/2014 9:54 AM --  Laboratory Lab Results: No results for input(s): WBC, HGB, HCT, PLT in the last 72 hours. BMET No results for input(s): NA, K, CL, CO2, GLUCOSE, BUN, CREATININE, CALCIUM in the last 72 hours.  COAG Lab Results  Component Value Date   INR 0.98 08/05/2014   INR 0.97 07/24/2014   No results found for: PTT

## 2014-08-12 NOTE — Progress Notes (Signed)
Occupational Therapy Session Note  Patient Details  Name: Carla Hopkins MRN: [id number] Date of Birth: 09-02-1950  Today's Date: 08/12/2014 OT Individual Time: 0900-1030 OT Individual Time Calculation (min): 90 min    Short Term Goals: Week 2:  OT Short Term Goal 1 (Week 2): Pt will complete a stand pivot transfer to toilet with steadying A. OT Short Term Goal 2 (Week 2): Pt will don a tshirt with S. OT Short Term Goal 3 (Week 2): Pt will be able to don B feet into underwear and pants without A from a seated position. OT Short Term Goal 4 (Week 2): Pt will bathe with S using a long sponge to reach R arm. OT Short Term Goal 5 (Week 2): Pt will stand at sink with steadying A to brush teeth.  Skilled Therapeutic Interventions/Progress Updates:    Pt seen this session to focus on transfer training with cane, balance and LUE NMR. Pt declined bathing or changing clothes. Pt did change shirts once it was pointed out to her that her current shirt was stained.  Pt ambulated to toilet with cane with close S. She continues to need A to fully pull pants over hips.  Pt seen in gym on mat to address R finger AROM with 15 min of estim to finger flexors and 15 to extensors. Pt continues to have a trace amount of movement in R [name]. Continues to have significant edema in hand with skin redness. PA reassured that this is a normal healing process.  Coban wrap removed prior to estim and reapplied after on hand. After estim, no active finger extension but pt able to tolerate the estim well. Improved AROM in shoulder/elbow with less pain with movements. Pt returned to room at end of the session resting in w/c with all needs met.    Therapy Documentation Precautions:  Precautions Precautions: Fall Precaution Comments: severe L sided inattention Restrictions Weight Bearing Restrictions: No     Pain: Pain Assessment Pain Assessment: 0-10 Pain Score: 6  Pain Location: Hand Pain Orientation: Left Pain  Descriptors / Indicators: Aching Pain Onset: On-going Patients Stated Pain Goal: 1 Pain Intervention(s): MD notified (Comment) (spoke with PA) ADL:   See FIM for current functional status  Therapy/Group: Individual Therapy  [place] 08/12/2014, 11:59 AM

## 2014-08-12 NOTE — Progress Notes (Signed)
Speech Language Pathology Daily Session Note  Patient Details  Name: Carla Hopkins MRN: [id number] Date of Birth: 12-01-1950  Today's Date: 08/12/2014 SLP Individual Time: 1100-1200 SLP Individual Time Calculation (min): 60 min  Short Term Goals: Week 1: SLP Short Term Goal 1 (Week 1): Pt will attend to left body/environment during semi-complex functional tasks with min assist verbal cues SLP Short Term Goal 2 (Week 1): Pt will complete semi-complex tasks with min assist verbal cues for functional problem solving.   SLP Short Term Goal 3 (Week 1): Pt will return demonstration of oral motor strengthening exercises targeting left sided weakness with supervision.  SLP Short Term Goal 4 (Week 1): Pt will utilize slow rate and overarticulation to achieve intelligibility in conversations with supervision.  SLP Short Term Goal 5 (Week 1): Pt will consume regular textures and thin liquids with mod I self monitoring and correction of left sided buccal residue.    Skilled Therapeutic Interventions:  Pt was seen for skilled ST targeting cognitive goals.  Upon arrival pt was seated upright in wheelchair, awake, alert, and agreeable to participate in ST.  SLP facilitated the session with a semi-complex home management task targeting abstract reasoning, functional problem solving, and visual scanning to the left of midline.  Pt required overall mod assist verbal and visual cues to locate information on the left side of a chart of 6 different insurance plans.  Pt was noted not only with left inattention but also disorganized visual scanning patterns and poor awareness of errors.   SLP provided skilled education for use of finger tracking and limiting visual stimuli to allow pt to only read one line at time.  Pt utilized the abovementioned compensatory strategies with min assist multimodal cues during a structured reading task.  Pt continues to come up with excuses for not participating fully in therapies and  frequently tells therapist "I couldn't do this before my stroke."  SLP has repeatedly explained rationale behind current ST goals in facilitating pt's return to functional independence; however, due to pt's poor awareness she demonstrates poor carryover of education and becomes easily frustrated with therapist.  Continue per current plan of care.    FIM:  Comprehension Comprehension Mode: Auditory Comprehension: 6-Follows complex conversation/direction: With extra time/assistive device Expression Expression Mode: Verbal Expression: 5-Expresses complex 90% of the time/cues < 10% of the time Social Interaction Social Interaction: 6-Interacts appropriately with others with medication or extra time (anti-anxiety, antidepressant). Problem Solving Problem Solving: 4-Solves basic 75 - 89% of the time/requires cueing 10 - 24% of the time Memory Memory: 4-Recognizes or recalls 75 - 89% of the time/requires cueing 10 - 24% of the time  Pain Pain Assessment Pain Assessment: No/denies pain   Therapy/Group: Individual Therapy  Octavia Mottola, Selinda Orion 08/12/2014, 4:31 PM

## 2014-08-12 NOTE — Progress Notes (Signed)
64 y.o. right handed female with history of tobacco abuse, hypertension. Patient lives alone independently prior to admission and working full time. Admitted 07/24/2014 after a fall noting left-sided weakness. No head trauma no loss of consciousness. MRI of the brain shows multifocal patchy acute right MCA territory infarcts underwent right carotid surgery 08/05/2014 per Dr. Oneida Alar  Subjective/Complaints: Poor intake last noc , told RN L Hand pain made her lose appettite Review of Systems - Negative except Left arm weak   Objective: Vital Signs: Blood pressure 109/58, pulse 77, temperature 98 F (36.7 C), temperature source Oral, resp. rate 18, height 5\' 2"  (1.575 m), weight 94.5 kg (208 lb 5.4 oz), SpO2 96 %. No results found. Results for orders placed or performed during the hospital encounter of 08/06/14 (from the past 72 hour(s))  Glucose, capillary     Status: Abnormal   Collection Time: 08/09/14 11:15 AM  Result Value Ref Range   Glucose-Capillary 121 (H) 65 - 99 mg/dL  Glucose, capillary     Status: Abnormal   Collection Time: 08/09/14  4:39 PM  Result Value Ref Range   Glucose-Capillary 107 (H) 65 - 99 mg/dL  Glucose, capillary     Status: Abnormal   Collection Time: 08/09/14  9:14 PM  Result Value Ref Range   Glucose-Capillary 113 (H) 65 - 99 mg/dL  Glucose, capillary     Status: Abnormal   Collection Time: 08/10/14  6:30 AM  Result Value Ref Range   Glucose-Capillary 132 (H) 65 - 99 mg/dL  Glucose, capillary     Status: None   Collection Time: 08/10/14 11:31 AM  Result Value Ref Range   Glucose-Capillary 90 65 - 99 mg/dL  Glucose, capillary     Status: None   Collection Time: 08/10/14  4:34 PM  Result Value Ref Range   Glucose-Capillary 99 65 - 99 mg/dL  Glucose, capillary     Status: Abnormal   Collection Time: 08/10/14  8:41 PM  Result Value Ref Range   Glucose-Capillary 106 (H) 65 - 99 mg/dL  Glucose, capillary     Status: Abnormal   Collection Time: 08/11/14   6:50 AM  Result Value Ref Range   Glucose-Capillary 129 (H) 65 - 99 mg/dL  Glucose, capillary     Status: Abnormal   Collection Time: 08/11/14 11:11 AM  Result Value Ref Range   Glucose-Capillary 100 (H) 65 - 99 mg/dL   Comment 1 Notify RN   Glucose, capillary     Status: Abnormal   Collection Time: 08/11/14  4:18 PM  Result Value Ref Range   Glucose-Capillary 134 (H) 65 - 99 mg/dL   Comment 1 Notify RN   Glucose, capillary     Status: Abnormal   Collection Time: 08/11/14  9:09 PM  Result Value Ref Range   Glucose-Capillary 108 (H) 65 - 99 mg/dL      HEENT: Left sided neck incision with gauze dressing no swelling, minimal eccymosis Cardio: RRR and no murmur Resp: CTA B/L and unlabored GI: BS positive and NT, ND Extremity:  Pulses positive and No Edema Skin:   Bruise ecchymosis , left dorsum of hand as well as right forearm Neuro: Alert/Oriented, Abnormal Sensory absent LT in LUE, Abnormal Motor 3- Left delt ,3- bi, 2- tri, , 0/5 in Left hand3-HF KE, Inattention and Other Left field cut Musc/Skel:  Swelling dorsum LEft hand improving, ecchymosis--coban wrap in place Gen NAD   Assessment/Plan: 1. Functional deficits secondary to Right MCA infarct with Left hemiparesis,  left neglect and anosognsia which require 3+ hours per day of interdisciplinary therapy in a comprehensive inpatient rehab setting. Physiatrist is providing close team supervision and 24 hour management of active medical problems listed below. Physiatrist and rehab team continue to assess barriers to discharge/monitor patient progress toward functional and medical goals. FIM: FIM - Bathing Bathing Steps Patient Completed: Chest, Left Arm, Abdomen, Buttocks, Right upper leg, Right lower leg (including foot), Left upper leg, Left lower leg (including foot), Front perineal area, Right Arm (used long sponge) Bathing: 4: Min-Patient completes 8-9 37f 10 parts or 75+ percent  FIM - Upper Body Dressing/Undressing Upper  body dressing/undressing steps patient completed: Thread/unthread right sleeve of pullover shirt/dresss, Thread/unthread left sleeve of pullover shirt/dress, Put head through opening of pull over shirt/dress, Pull shirt over trunk (4/7 steps - total A with bra) Upper body dressing/undressing: 3: Mod-Patient completed 50-74% of tasks FIM - Lower Body Dressing/Undressing Lower body dressing/undressing steps patient completed: Thread/unthread right underwear leg, Thread/unthread left underwear leg, Thread/unthread right pants leg, Thread/unthread left pants leg, Don/Doff right shoe Lower body dressing/undressing: 3: Mod-Patient completed 50-74% of tasks  FIM - Toileting Toileting steps completed by patient: Performs perineal hygiene, Adjust clothing prior to toileting Toileting Assistive Devices: Grab bar or rail for support Toileting: 3: Mod-Patient completed 2 of 3 steps  FIM - Radio producer Devices: Oncologist Transfers: 4-From toilet/BSC: Min A (steadying Pt. > 75%), 4-To toilet/BSC: Min A (steadying Pt. > 75%)  FIM - Control and instrumentation engineer Devices: Best boy: 4: Chair or W/C > Bed: Min A (steadying Pt. > 75%), 3: Chair or W/C > Bed: Mod A (lift or lower assist)  FIM - Locomotion: Wheelchair Distance: 60 Locomotion: Wheelchair: 1: Total Assistance/staff pushes wheelchair (Pt<25%) FIM - Locomotion: Ambulation Locomotion: Ambulation Assistive Devices: Nurse, adult Ambulation/Gait Assistance: 3: Mod assist Locomotion: Ambulation: 2: Travels 50 - 149 ft with moderate assistance (Pt: 50 - 74%)  Comprehension Comprehension Mode: Auditory Comprehension: 6-Follows complex conversation/direction: With extra time/assistive device  Expression Expression Mode: Verbal Expression: 5-Expresses complex 90% of the time/cues < 10% of the time  Social Interaction Social Interaction: 6-Interacts appropriately with others with  medication or extra time (anti-anxiety, antidepressant).  Problem Solving Problem Solving: 4-Solves basic 75 - 89% of the time/requires cueing 10 - 24% of the time  Memory Memory: 4-Recognizes or recalls 75 - 89% of the time/requires cueing 10 - 24% of the time  Medical Problem List and Plan: 1. Functional deficits secondary to right MCA infarct 2.  DVT Prophylaxis/Anticoagulation: Subcutaneous Lovenox for DVT prophylaxis. Monitor for any bleeding episodes 3. Pain Management: Percocet as needed. Monitor with increased mobility 4. Right carotid stenosis. Status post carotid endarterectomy 08/05/2014 5. Neuropsych: This patient is capable of making decisions on her own behalf. 6. Skin/Wound Care: Routine skin checks 7. Fluids/Electrolytes/Nutrition:   I and O's with follow-up chemistries 8. Dysphagia. Dysphagia #2 sent liquids follow-up speech therapy, adequate po fluid intake 9. Hypertension. Lisinopril 5 mg daily. M  10. Diabetes mellitus. Hemoglobin A1c 8.5. Glucophage 500 mg twice a day.   -blood sugars well controlled 11. Hypothyroidism. Synthroid 12. Hyperlipidemia. Lipitor 13. IV infiltration left hand. Conservative care/wrapping, question whether pt may be developing RSD, given it is on plegic side, consider trial of steroids, may need to check bone scan  14. 6 mm pulmonary nodule identified on CT and GL chest. Recommendations follow-up CT of the chest in 6-12 months  LOS (Days) 6 A FACE  TO FACE EVALUATION WAS PERFORMED  Charlett Blake 08/12/2014, 6:39 AM

## 2014-08-12 NOTE — Progress Notes (Signed)
Social Work Patient ID: Carla Hopkins, female   DOB: 24-May-1950, 64 y.o.   MRN: [id number] Spoke with daughter via telephone to discuss concerns she has with pt going home from rehab. She reports: " My Mom can not afford private duty care at home." She feels pt should go to a NH upon discharge from rehab and plans to talk with pt about this. Discussed [employer] would  Need to agree to cover this and sometime if pt comes to rehab  insurance doesn;t cover NH.  Will work with pt and daughter on the best plan for discharge, pt has refused NHP when discussed before.

## 2014-08-12 NOTE — Progress Notes (Signed)
Physical Therapy Session Note  Patient Details  Name: Carla Hopkins MRN: [id number] Date of Birth: [birth year]  Today's Date: 08/12/2014 PT Individual Time: 1400-1500 PT Individual Time Calculation (min): 60 min   Short Term Goals: Week 1:  PT Short Term Goal 1 - Progress (Week 1): Met PT Short Term Goal 2 - Progress (Week 1): Not met PT Short Term Goal 3 - Progress (Week 1): Progressing toward goal PT Short Term Goal 4 - Progress (Week 1): Met  Skilled Therapeutic Interventions/Progress Updates:  Session focused on transfers, ambulation, stairs, standing tolerance and attention to left. Patient sitting in wheelchair upon entering room. Patient performed stand pivot transfers with [facility] [place] wheelchair <> regular double bed, w/c <> simulated car, and w/c <> love seat going to left and right with supervision. Patient required occasional cueing for safety with set up. Patient ambulated 140 feet x 3 with SBQC in controlled environment.  For first 25 feet, pt only required close supervision. The further patient ambulated, the more she began to drag left LE and require up to mod assist for balance. Patient would correct with verbal cueing for a few steps but would eventually decrease left hip and knee flexion causing left LE to drag. Patient ambulated up and down 12 steps with 1 railing and min assist for balance and moderate cueing for sequencing of feet on steps. Patient stood x 4 minutes to remove pillow cases from clothes line and fold them at table top working on left LE standing endurance, balance, and attention to left. Patient required 3 to 4 cues to attend to left. Patient used Biodex balance system on weight shift and limits of stability for left LE control during weight shifting and LE standing tolerance. Patient left in recliner with all items in reach.  Therapy Documentation Precautions:  Precautions Precautions: Fall Precaution Comments: severe L sided inattention Restrictions Weight  Bearing Restrictions: No  Vital Signs: Therapy Vitals Temp: 98.3 F (36.8 C) Temp Source: Oral Pulse Rate: 71 Resp: 18 BP: (!) 99/59 mmHg Patient Position (if appropriate): Sitting Oxygen Therapy SpO2: 96 % O2 Device: Not Delivered  Pain: Pain Assessment Pain Assessment: 0-10 Pain Score: 2  Pain Location: Hand Pain Orientation: Left Pain Descriptors / Indicators: Aching  Locomotion : Ambulation Ambulation/Gait Assistance: 3: Mod assist   See FIM for current functional status  Therapy/Group: Individual Therapy  Sanjuana Letters 08/12/2014, 3:11 PM

## 2014-08-13 ENCOUNTER — Inpatient Hospital Stay (HOSPITAL_COMMUNITY): Payer: BLUE CROSS/BLUE SHIELD | Admitting: Occupational Therapy

## 2014-08-13 ENCOUNTER — Inpatient Hospital Stay (HOSPITAL_COMMUNITY): Payer: BLUE CROSS/BLUE SHIELD

## 2014-08-13 ENCOUNTER — Inpatient Hospital Stay (HOSPITAL_COMMUNITY): Payer: BLUE CROSS/BLUE SHIELD | Admitting: Speech Pathology

## 2014-08-13 ENCOUNTER — Inpatient Hospital Stay (HOSPITAL_COMMUNITY): Payer: BLUE CROSS/BLUE SHIELD | Admitting: Physical Therapy

## 2014-08-13 LAB — GLUCOSE, CAPILLARY
GLUCOSE-CAPILLARY: 87 mg/dL (ref 65–99)
Glucose-Capillary: 119 mg/dL — ABNORMAL HIGH (ref 65–99)
Glucose-Capillary: 104 mg/dL — ABNORMAL HIGH (ref 65–99)
Glucose-Capillary: 123 mg/dL — ABNORMAL HIGH (ref 65–99)

## 2014-08-13 NOTE — Progress Notes (Signed)
Occupational Therapy Session Note  Patient Details  Name: Carla Hopkins MRN: [id number] Date of Birth: 1951-02-23  Today's Date: 08/13/2014 OT Individual Time: 0354-6568 OT Individual Time Calculation (min): 57 min    Short Term Goals: Week 2:  OT Short Term Goal 1 (Week 2): Pt will complete a stand pivot transfer to toilet with steadying A. OT Short Term Goal 2 (Week 2): Pt will don a tshirt with S. OT Short Term Goal 3 (Week 2): Pt will be able to don B feet into underwear and pants without A from a seated position. OT Short Term Goal 4 (Week 2): Pt will bathe with S using a long sponge to reach R arm. OT Short Term Goal 5 (Week 2): Pt will stand at sink with steadying A to brush teeth.  Skilled Therapeutic Interventions/Progress Updates:      Pt seen for BADL retraining of toileting, bathing, and dressing with a focus on balance, active use of LUE, visual perceptual skills during dressing tasks. Pt was able to use quad cane to ambulate in room and complete ADL transfers with close S. She was able to maintain standing balance at sink to brush teeth with S.  She continues to do well with bathing using long sponge, but continues to demonstrate spatial relations impairments with donning clothing to orient shirt. Needs max cues to orient shirt and to follow through with steps. Total A with sports bra and cues with shirt.  Improved spatial awareness with donning LB clothing over feet but cannot pull over hips due to body habitus.  Pt actively used L shoulder to lift arm for bathing and dressing. Pt now has increased active DIP flexion. Pt taken to gym to transfer to mat. Worked on gentle PROM to L hand. Used UE ranger for shoulder/ elbow AROM exercise. Rewrapped hand in coban to reduce edema.  Pt's PT arrived for her next session.  Therapy Documentation Precautions:  Precautions Precautions: Fall Precaution Comments: severe L sided inattention Restrictions Weight Bearing Restrictions: No     Vital Signs: Therapy Vitals Temp: 98.4 F (36.9 C) Temp Source: Oral Pulse Rate: 78 Resp: 18 BP: 125/62 mmHg Patient Position (if appropriate): Sitting Oxygen Therapy SpO2: 100 % O2 Device: Not Delivered Pain: Pain Assessment Pain Assessment: No/denies pain ADL:  See FIM for current functional status  Therapy/Group: Individual Therapy  [place] 08/13/2014, 9:38 AM

## 2014-08-13 NOTE — Progress Notes (Signed)
Social Work Patient ID: Carla Hopkins, female   DOB: 08/16/1950, 64 y.o.   MRN: [id number] Met with pt to discuss team conference goals-supervision level due to safety, cueing and inattention.  Targeted discharge date of 5/27.  Pt plans to have daughter come and then Hire assistance.  She has no plans to go to a NH, which the option was presented to her.  Have left a message for her daughter.  Daughter was feeling maybe NH option would be the best one due to Hiring assistance is quite expensive.

## 2014-08-13 NOTE — Patient Care Conference (Signed)
Inpatient RehabilitationTeam Conference and Plan of Care Update Date: 08/13/2014   Time: 11;00 AM    Patient Name: Carla Hopkins      Medical Record Number: [id number]  Date of Birth: [birth year] Sex: Female         Room/Bed: 4M01C/4M01C-01 Payor Info: Payor: BLUE CROSS BLUE SHIELD / Plan: BCBS OTHER / Product Type: *No Product type* /    Admitting Diagnosis: R MCA CVA   Admit Date/Time:  08/06/2014  3:14 PM Admission Comments: No comment available   Primary Diagnosis:  <principal problem not specified> Principal Problem: <principal problem not specified>  Patient Active Problem List   Diagnosis Date Noted  . History of right MCA stroke 08/06/2014  . Carotid stenosis 08/05/2014  . Acute right MCA stroke 07/30/2014  . Cerebral infarction due to thrombosis of right middle cerebral artery   . Thyroid activity decreased   . Left-sided weakness   . Stroke   . Essential hypertension   . Carotid artery stenosis   . CVA (cerebral infarction) 07/24/2014  . Hyperlipidemia 07/24/2014  . Diabetes 07/24/2014  . Elevated troponin 07/24/2014  . Tobacco abuse 07/24/2014  . Benign essential HTN 07/23/2014  . Obstructive sleep apnea 07/23/2014  . Hypothyroidism 07/03/2014  . Type II or unspecified type diabetes mellitus without mention of complication, not stated as uncontrolled 09/25/2013  . BMI 38.0-38.9,adult 05/19/2013  . Nicotine addiction 05/19/2013  . Right atrial enlargement 05/19/2013    Expected Discharge Date: Expected Discharge Date: 08/22/14  Team Members Present: Physician leading conference: Dr. Alysia Penna Social Worker Present: Ovidio Kin, LCSW Nurse Present: Heather Roberts, RN PT Present: Jorge Mandril, Renaye Rakers, PT OT Present: Simonne Come, OT SLP Present: Windell Moulding, SLP PPS Coordinator present : Daiva Nakayama, RN, CRRN     Current Status/Progress Goal Weekly Team Focus  Medical   very poor awareness of defs, severe visuosptial neglect, mobility  improving  improve awareness of deficits  d/c planning   Bowel/Bladder   continent of bowel and bladder  remain continent of bowel and bladder   monitor bowel and bladder and record I&O each shift   Swallow/Nutrition/ Hydration   On regular textures with thin liquids, intermittent supervision for tray set up and use of swallowing precautions   Mod I  continue to monitor diet toleration and use of compensatory strategies.    ADL's   mod A with dressing and toileting, supervision bathing, supervision transfers with the cane  supervision with BADLs  RUE NMR, dynamic balance, adaptive dressing techniques, pt education   Mobility   supervision transfers, supervision> mod assist gait x 100', close supervision up/down 4 steps 1 rail  supervision overall  L attention and awareness, neuro re-ed, gait, balance, activity tolerance   Communication             Safety/Cognition/ Behavioral Observations  overall mod assist for semi-complex tasks   supervision, downgraded   left attention, semi-complex functional problem solving    Pain   Pain managed with Q6 robaxin.   Pain less than 3  Assess and manage pain frequently   Skin   Left Hand with swollen and bruised bump  No new skin breakdown.   Prevent skin breakdown and monitor skin for proper healing     Rehab Goals Patient on target to meet rehab goals: Yes *See Care Plan and progress notes for long and short-term goals.  Barriers to Discharge: no 24/7 caregiver    Possible Resolutions to Barriers:  ? asst living  Discharge Planning/Teaching Needs:  Pt and daughter aware of team's recommmendation fo 24 hr care upon discharge-options hired assist versus NHP. Daughter feels NH best option and pt wants to go home      Team Discussion:  Moving better using cane with assist in therapies. Having neglect, inattention and needs cueing for safety-makes her a high fall risk. Left hand less painful today. Drags foot at times. Constant cueing by  therapists. Will need 24 hr care at discharge-daughter and pt discussing options  Revisions to Treatment Plan:  None   Continued Need for Acute Rehabilitation Level of Care: The patient requires daily medical management by a physician with specialized training in physical medicine and rehabilitation for the following conditions: Daily direction of a multidisciplinary physical rehabilitation program to ensure safe treatment while eliciting the highest outcome that is of practical value to the patient.: Yes Daily medical management of patient stability for increased activity during participation in an intensive rehabilitation regime.: Yes Daily analysis of laboratory values and/or radiology reports with any subsequent need for medication adjustment of medical intervention for : Neurological problems;Other  Elease Hashimoto 08/13/2014, 12:35 PM

## 2014-08-13 NOTE — Progress Notes (Signed)
Physical Therapy Session Note  Patient Details  Name: Carla Hopkins MRN: [id number] Date of Birth: [birth year]  Today's Date: 08/13/2014 PT Individual Time:  -  1530-1600   Treatment Time: 30 min  Short Term Goals: Week 1:  PT Short Term Goal 1 - Progress (Week 1): Met PT Short Term Goal 2 - Progress (Week 1): Not met PT Short Term Goal 3 - Progress (Week 1): Progressing toward goal PT Short Term Goal 4 - Progress (Week 1): Met  Skilled Therapeutic Interventions/Progress Updates:    Gait Training: PT instructs pt in ambulation on level surface in controlled environment x 160' with SBQC req up to min A for balance, and verbal cues for smaller cane step length, progress the cane forward every time her L leg steps forward. Dual task technique incorporated with visual scanning to L in order to read door signs req mod-max cues due to pt concentration on ambulation. PT instructs pt in ambulating up 12 steps with R rail req up to min A in step-over-step pattern, then down 12 steps with R rail in step-to pattern, progressing to step-to alternating on last 2 steps req up to min A. SaO2 drops to 90% on RA at top of steps, but pt recovers quickly.   Pt's activity tolerance continues to be limited, but stair training is progressing. Continue per PT POC.   Therapy Documentation Precautions:  Precautions Precautions: Fall Precaution Comments: severe L sided inattention Restrictions Weight Bearing Restrictions: No Pain: Pain Assessment Pain Assessment: 0-10 Pain Score: 0-No pain  See FIM for current functional status  Therapy/Group: Individual Therapy  Yuvia Plant M 08/13/2014, 11:19 AM

## 2014-08-13 NOTE — Progress Notes (Signed)
Speech Language Pathology Daily Session Note  Patient Details  Name: Carla Hopkins MRN: [id number] Date of Birth: 1951/02/02  Today's Date: 08/13/2014 SLP Individual Time: 1300-1400 SLP Individual Time Calculation (min): 60 min  Short Term Goals: Week 1: SLP Short Term Goal 1 (Week 1): Pt will attend to left body/environment during semi-complex functional tasks with min assist verbal cues SLP Short Term Goal 2 (Week 1): Pt will complete semi-complex tasks with min assist verbal cues for functional problem solving.   SLP Short Term Goal 3 (Week 1): Pt will return demonstration of oral motor strengthening exercises targeting left sided weakness with supervision.  SLP Short Term Goal 4 (Week 1): Pt will utilize slow rate and overarticulation to achieve intelligibility in conversations with supervision.  SLP Short Term Goal 5 (Week 1): Pt will consume regular textures and thin liquids with mod I self monitoring and correction of left sided buccal residue.    Skilled Therapeutic Interventions:  Pt was seen for skilled ST targeting cognitive goals.  Upon arrival, pt was seated upright in wheelchair, awake, alert, and agreeable to participate in [place].  SLP facilitated the session with a novel, semi-complex card game targeting functional problem solving and working memory. Pt planned and executed a problem solving strategy during the abovementioned task with overall min assist verbal cues for recall of task rules and mental flexibility.  Pt also benefited from min assist verbal and visual cues to attend to cards on the left.  SLP provided skilled education providing ideas of activities for cognitive reorganization to maximize carryover of targeted skills in between therapy sessions at next level of care.  Pt continues to present with decreased awareness of deficits and reported that she would not be able to afford 24/7 hired caregivers and that she would be fine with 8 hours of care per day and intermittent  assist from friends and neighbors.  SLP reinforced recommendation that pt have 24/7 supervision at discharge due to cognitive and physical deficits.  Pt verbalized understanding of recommendation but SLP will continue to reinforce recommendations.  Pt left upright in wheelchair with all needs left within reach.  Continue per current plan of care.    FIM:  Comprehension Comprehension Mode: Auditory Comprehension: 6-Follows complex conversation/direction: With extra time/assistive device Expression Expression Mode: Verbal Expression: 5-Expresses complex 90% of the time/cues < 10% of the time Social Interaction Social Interaction: 6-Interacts appropriately with others with medication or extra time (anti-anxiety, antidepressant). Problem Solving Problem Solving: 4-Solves basic 75 - 89% of the time/requires cueing 10 - 24% of the time Memory Memory: 4-Recognizes or recalls 75 - 89% of the time/requires cueing 10 - 24% of the time  Pain Pain Assessment Pain Assessment: No/denies pain  Therapy/Group: Individual Therapy  Kristelle Cavallaro, Selinda Orion 08/13/2014, 4:05 PM

## 2014-08-13 NOTE — Progress Notes (Signed)
Physical Therapy Weekly Progress Note  Patient Details  Name: Carla Hopkins MRN: [id number] Date of Birth: 1950/04/16  Beginning of progress report period: Aug 06, 2013 End of progress report period: Aug 12, 2013  Today's Date: 08/13/2014 PT Individual Time: 0900-1000 PT Individual Time Calculation (min): 60 min   Patient has met 4 of 4 short term goals.    Patient continues to demonstrate the following deficits:L neglect, safety, awareness, attention, balance, activity tolerance and therefore will continue to benefit from skilled PT intervention to enhance overall performance with activity tolerance, balance, postural control, ability to compensate for deficits, functional use of  left upper extremity and left lower extremity, attention and awareness.  Patient progressing toward long term goals..  Continue plan of care.  PT Short Term Goals Week 2:  PT Short Term Goal 1 (Week 2): pt will perform basic transfers to R and L with supervision, 2 or less cues for safety PT Short Term Goal 1 - Progress (Week 2): Met PT Short Term Goal 2 (Week 2): pt will perform gait x 100' controlled setting, with moderate assistance, LRAD PT Short Term Goal 2 - Progress (Week 2): Met PT Short Term Goal 3 (Week 2): pt will ascend and descend 4 steps with 1 rail with supervision , 2 or less cues for safety PT Short Term Goal 3 - Progress (Week 2): Met PT Short Term Goal 4 (Week 2): pt will perform gait x 100' in community setting, with supervision /min guard assistance, 4 or less cues for avoiding obstacles PT Short Term Goal 4 - Progress (Week 2): Met  Skilled Therapeutic Interventions/Progress Updates:  tx focused on neuromuscular re-education via visual feedback, VCS, manual cues for L LE stance stability and swing phase control, light wt bearing L hand for LUE shoulder activation   -L shoulder flexion/ext with wt bearing through L hand on pillow  - blocked practice sit>< stand without use of UEs,  biased toward LLE -static> dynamic balance in standing on compliant [employer] mat -gait with NBQC x 100' with close supervision> mod assist during turns and as she fatigued; cues for upright trunk, forward gaze -up/down 4 steps R rail, x 2 step through when ascending, step to when descending; pt required min guard and max cues for sequence and technique 1st trial, close supervision, 0 cues 2nd trial -L attention during dual task of community ambulation in lobby and gift shop while scanning visually for requested items, requiring min assist gait on carpet for 100' (with multiple pauses) and max assist for L visual scanning.  Pt unable to scan while ambulating. -L attention during scooting up to/away from from dining table while sitting in armless chair, x 3 trials, with improving angle as she attended more to L  Pt demonstrates L neglect during all activities and mobility. She improves during session with repetition of tasks.  She is particularly unsafe with LUE due to neglect, requiring physical assistance to prevent LUE elbow from striking arm trough if it is on w/c.    Therapy Documentation Precautions:  Precautions Precautions: Fall Precaution Comments: severe L sided inattention Restrictions Weight Bearing Restrictions: No      See FIM for current functional status  Therapy/Group: Individual Therapy  Carla Hopkins,Carla Hopkins 08/13/2014, 1:50 PM

## 2014-08-13 NOTE — Plan of Care (Signed)
Problem: RH Problem Solving Goal: LTG Patient will demonstrate problem solving for (SLP) LTG: Patient will demonstrate problem solving for basic/complex daily situations with cues (SLP)  Downgraded due to slow progress      Problem: RH Awareness Goal: LTG: Patient will demonstrate intellectual/emergent (SLP) LTG: Patient will demonstrate intellectual/emergent/anticipatory awareness with assist during a cognitive/linguistic activity (SLP)  Downgraded due to slow progress   Problem: RH Other (Specify) Goal: RH LTG Other (Specify) Downgraded due to slow progress

## 2014-08-13 NOTE — Progress Notes (Signed)
64 y.o. right handed female with history of tobacco abuse, hypertension. Patient lives alone independently prior to admission and working full time. Admitted 07/24/2014 after a fall noting left-sided weakness. No head trauma no loss of consciousness. MRI of the brain shows multifocal patchy acute right MCA territory infarcts underwent right carotid surgery 08/05/2014 per Dr. Oneida Alar  Subjective/Complaints: Pt without new issues overnite, hand pain improved on left Moving Left fingers more Review of Systems - Negative except Left arm weak   Objective: Vital Signs: Blood pressure 123/61, pulse 78, temperature 98.4 F (36.9 C), temperature source Oral, resp. rate 18, height '5\' 2"'  (1.575 m), weight 94.5 kg (208 lb 5.4 oz), SpO2 100 %. No results found. Results for orders placed or performed during the hospital encounter of 08/06/14 (from the past 72 hour(s))  Glucose, capillary     Status: None   Collection Time: 08/10/14 11:31 AM  Result Value Ref Range   Glucose-Capillary 90 65 - 99 mg/dL  Glucose, capillary     Status: None   Collection Time: 08/10/14  4:34 PM  Result Value Ref Range   Glucose-Capillary 99 65 - 99 mg/dL  Glucose, capillary     Status: Abnormal   Collection Time: 08/10/14  8:41 PM  Result Value Ref Range   Glucose-Capillary 106 (H) 65 - 99 mg/dL  Glucose, capillary     Status: Abnormal   Collection Time: 08/11/14  6:50 AM  Result Value Ref Range   Glucose-Capillary 129 (H) 65 - 99 mg/dL  Glucose, capillary     Status: Abnormal   Collection Time: 08/11/14 11:11 AM  Result Value Ref Range   Glucose-Capillary 100 (H) 65 - 99 mg/dL   Comment 1 Notify RN   Glucose, capillary     Status: Abnormal   Collection Time: 08/11/14  4:18 PM  Result Value Ref Range   Glucose-Capillary 134 (H) 65 - 99 mg/dL   Comment 1 Notify RN   Glucose, capillary     Status: Abnormal   Collection Time: 08/11/14  9:09 PM  Result Value Ref Range   Glucose-Capillary 108 (H) 65 - 99 mg/dL   Glucose, capillary     Status: Abnormal   Collection Time: 08/12/14  6:57 AM  Result Value Ref Range   Glucose-Capillary 127 (H) 65 - 99 mg/dL  Glucose, capillary     Status: Abnormal   Collection Time: 08/12/14 11:53 AM  Result Value Ref Range   Glucose-Capillary 106 (H) 65 - 99 mg/dL  Glucose, capillary     Status: Abnormal   Collection Time: 08/12/14  4:28 PM  Result Value Ref Range   Glucose-Capillary 106 (H) 65 - 99 mg/dL  Glucose, capillary     Status: Abnormal   Collection Time: 08/12/14  8:54 PM  Result Value Ref Range   Glucose-Capillary 108 (H) 65 - 99 mg/dL  Glucose, capillary     Status: Abnormal   Collection Time: 08/13/14  6:52 AM  Result Value Ref Range   Glucose-Capillary 119 (H) 65 - 99 mg/dL      HEENT: Left sided neck incision with gauze dressing no swelling, minimal eccymosis Cardio: RRR and no murmur Resp: CTA B/L and unlabored GI: BS positive and NT, ND Extremity:  Pulses positive and No Edema Skin:   Bruise ecchymosis , left dorsum of hand as well as right forearm Neuro: Alert/Oriented, Abnormal Sensory absent LT in LUE, Abnormal Motor 3- Left delt ,3- bi, 2- tri, , 0/5 in Left hand3-HF KE, Inattention and  Other Left field cut Musc/Skel:  Swelling dorsum LEft hand improving, ecchymosis--coban wrap in place Gen NAD   Assessment/Plan: 1. Functional deficits secondary to Right MCA infarct with Left hemiparesis, left neglect and anosognsia which require 3+ hours per day of interdisciplinary therapy in a comprehensive inpatient rehab setting. Physiatrist is providing close team supervision and 24 hour management of active medical problems listed below. Physiatrist and rehab team continue to assess barriers to discharge/monitor patient progress toward functional and medical goals. Team conference today please see physician documentation under team conference tab, met with team face-to-face to discuss problems,progress, and goals. Formulized individual treatment  plan based on medical history, underlying problem and comorbidities. FIM: FIM - Bathing Bathing Steps Patient Completed: Chest, Left Arm, Abdomen, Buttocks, Right upper leg, Right lower leg (including foot), Left upper leg, Left lower leg (including foot), Front perineal area, Right Arm (used long sponge) Bathing: 4: Min-Patient completes 8-9 [id number] parts or 75+ percent  FIM - Upper Body Dressing/Undressing Upper body dressing/undressing steps patient completed: Thread/unthread right sleeve of pullover shirt/dresss, Thread/unthread left sleeve of pullover shirt/dress, Put head through opening of pull over shirt/dress Upper body dressing/undressing: 3: Mod-Patient completed 50-74% of tasks FIM - Lower Body Dressing/Undressing Lower body dressing/undressing steps patient completed: Thread/unthread right underwear leg, Thread/unthread left underwear leg, Thread/unthread right pants leg, Thread/unthread left pants leg, Don/Doff right shoe Lower body dressing/undressing: 3: Mod-Patient completed 50-74% of tasks  FIM - Toileting Toileting steps completed by patient: Performs perineal hygiene, Adjust clothing prior to toileting Toileting Assistive Devices: Grab bar or rail for support Toileting: 3: Mod-Patient completed 2 of 3 steps  FIM - TRadio producerDevices: COncologistTransfers: 5-To toilet/BSC: Supervision (verbal cues/safety issues), 5-From toilet/BSC: Supervision (verbal cues/safety issues)  FIM - BControl and instrumentation engineerDevices: CBest boy 5: Supine > Sit: Supervision (verbal cues/safety issues), 5: Sit > Supine: Supervision (verbal cues/safety issues), 5: Bed > Chair or W/C: Supervision (verbal cues/safety issues), 5: Chair or W/C > Bed: Supervision (verbal cues/safety issues)  FIM - Locomotion: Wheelchair Distance: 60 Locomotion: Wheelchair: 1: Total Assistance/staff pushes wheelchair (Pt<25%) FIM - Locomotion:  Ambulation Locomotion: Ambulation Assistive Devices: CNurse, adultAmbulation/Gait Assistance: 3: Mod assist Locomotion: Ambulation: 2: Travels 50 - 149 ft with moderate assistance (Pt: 50 - 74%)  Comprehension Comprehension Mode: Auditory Comprehension: 6-Follows complex conversation/direction: With extra time/assistive device  Expression Expression Mode: Verbal Expression: 5-Expresses complex 90% of the time/cues < 10% of the time  Social Interaction Social Interaction: 6-Interacts appropriately with others with medication or extra time (anti-anxiety, antidepressant).  Problem Solving Problem Solving: 4-Solves basic 75 - 89% of the time/requires cueing 10 - 24% of the time  Memory Memory: 4-Recognizes or recalls 75 - 89% of the time/requires cueing 10 - 24% of the time  Medical Problem List and Plan: 1. Functional deficits secondary to right MCA infarct 2.  DVT Prophylaxis/Anticoagulation: Subcutaneous Lovenox for DVT prophylaxis. Monitor for any bleeding episodes 3. Pain Management: Percocet as needed. Monitor with increased mobility 4. Right carotid stenosis. Status post carotid endarterectomy 08/05/2014 5. Neuropsych: This patient is capable of making decisions on her own behalf. 6. Skin/Wound Care: Routine skin checks 7. Fluids/Electrolytes/Nutrition:   I and O's with follow-up chemistries 8. Dysphagia. Dysphagia #2 sent liquids follow-up speech therapy, adequate po fluid intake 9. Hypertension. Lisinopril 5 mg daily. M  10. Diabetes mellitus. Hemoglobin A1c 8.5. Glucophage 500 mg twice a day.   -blood sugars well controlled 11. Hypothyroidism. Synthroid  12. Hyperlipidemia. Lipitor 13. IV infiltration left hand. Conservative care/wrapping, question whether pt may be developing RSD, given it is on plegic side, consider trial of steroids, may need to check bone scan  14. 6 mm pulmonary nodule identified on CT and GL chest. Recommendations follow-up CT of the chest in 6-12  months  LOS (Days) 7 A FACE TO FACE EVALUATION WAS PERFORMED  Bates Col[place] E 08/13/2014, 7:01 AM

## 2014-08-14 ENCOUNTER — Encounter (HOSPITAL_COMMUNITY): Payer: BLUE CROSS/BLUE SHIELD

## 2014-08-14 ENCOUNTER — Inpatient Hospital Stay (HOSPITAL_COMMUNITY): Payer: BLUE CROSS/BLUE SHIELD | Admitting: Speech Pathology

## 2014-08-14 ENCOUNTER — Inpatient Hospital Stay (HOSPITAL_COMMUNITY): Payer: BLUE CROSS/BLUE SHIELD

## 2014-08-14 ENCOUNTER — Inpatient Hospital Stay (HOSPITAL_COMMUNITY): Payer: BLUE CROSS/BLUE SHIELD | Admitting: Occupational Therapy

## 2014-08-14 DIAGNOSIS — I6340 Cerebral infarction due to embolism of unspecified cerebral artery: Secondary | ICD-10-CM

## 2014-08-14 LAB — GLUCOSE, CAPILLARY
GLUCOSE-CAPILLARY: 103 mg/dL — AB (ref 65–99)
GLUCOSE-CAPILLARY: 113 mg/dL — AB (ref 65–99)
GLUCOSE-CAPILLARY: 117 mg/dL — AB (ref 65–99)
Glucose-Capillary: 76 mg/dL (ref 65–99)

## 2014-08-14 NOTE — Progress Notes (Signed)
Occupational Therapy Session Note  Patient Details  Name: Carla Hopkins MRN: [id number] Date of Birth: 02/08/1951  Today's Date: 08/14/2014 OT Individual Time: 1430-1500 OT Individual Time Calculation (min): 30 min    Short Term Goals: Week 2:  OT Short Term Goal 1 (Week 2): Pt will complete a stand pivot transfer to toilet with steadying A. OT Short Term Goal 2 (Week 2): Pt will don a tshirt with S. OT Short Term Goal 3 (Week 2): Pt will be able to don B feet into underwear and pants without A from a seated position. OT Short Term Goal 4 (Week 2): Pt will bathe with S using a long sponge to reach R arm. OT Short Term Goal 5 (Week 2): Pt will stand at sink with steadying A to brush teeth.  Skilled Therapeutic Interventions/Progress Updates:    PT seen for OT session focusing on neuromuscular re-education. Pt in w/c upon arrival, agreeable to tx. Pt attempted cup stacking task, however, unable to fully extend fingers in order to pick up cup. Due to edema in L UE, pt with difficulty using hand in functional manner despite hand over hand assist provided to assist with picking item up. Pt stated that edema is "normal" and she has had it since stroke, and that it is actually better than it has been. This was first time working with this therapist, as a result unable to confirm pt's statement. Pt taken to therapy day room to utilize larger tables, pt transferred into w/c with supervision and taken to day room total A for time management. Seated in w/c pt complete towel pushes with self hand over hand assist. Pushes completed anterior/posterior, and to B sides. Each exercise completed of 10 reps x2 sets. Verbal and visual cues provided for pt to reach for functional object when completing towel pushes Pt voicing fatigue in shoulder at end of second set. At end of session, pt self propelled w/c ~5 ft with supervision. Inititally assist required as pt only able to steer with R UE. . VCs provided for proper  positioning to bring feet supported on floor and use B LEs and R UE to propel w/c. Pt able to propel ~5 ft before fatiguing. Pt taken remainder of way back to room total A. In room, pt very impulsive with mobility, completing squat pivot transfer to recliner with only one brake locked, footplates still attached, and did not make therapist aware of intent to transfer. Education provided regarding need for assist, fall risk, safety with mobility and w/c management. Pt left sitting in recliner at end of session, all needs in reach and CSW present.   Therapy Documentation Precautions:  Precautions Precautions: Fall Precaution Comments: severe L sided inattention Restrictions Weight Bearing Restrictions: No Pain: Pain Assessment Pain Assessment: No/denies pain  See FIM for current functional status  Therapy/Group: Individual Therapy  Lewis, Laiden Milles C 08/14/2014, 12:36 PM

## 2014-08-14 NOTE — Progress Notes (Signed)
64 y.o. right handed female with history of tobacco abuse, hypertension. Patient lives alone independently prior to admission and working full time. Admitted 07/24/2014 after a fall noting left-sided weakness. No head trauma no loss of consciousness. MRI of the brain shows multifocal patchy acute right MCA territory infarcts underwent right carotid surgery 08/05/2014 per Dr. Oneida Alar  Subjective/Complaints: Pt is unhappy about daughter's suggestion of SNF, but realizes she cannot afford in home care Review of Systems - Negative except Left arm weak   Objective: Vital Signs: Blood pressure 128/64, pulse 79, temperature 97.5 F (36.4 C), temperature source Oral, resp. rate 18, height 5\' 2"  (1.575 m), weight 94.5 kg (208 lb 5.4 oz), SpO2 99 %. No results found. Results for orders placed or performed during the hospital encounter of 08/06/14 (from the past 72 hour(s))  Glucose, capillary     Status: Abnormal   Collection Time: 08/11/14 11:11 AM  Result Value Ref Range   Glucose-Capillary 100 (H) 65 - 99 mg/dL   Comment 1 Notify RN   Glucose, capillary     Status: Abnormal   Collection Time: 08/11/14  4:18 PM  Result Value Ref Range   Glucose-Capillary 134 (H) 65 - 99 mg/dL   Comment 1 Notify RN   Glucose, capillary     Status: Abnormal   Collection Time: 08/11/14  9:09 PM  Result Value Ref Range   Glucose-Capillary 108 (H) 65 - 99 mg/dL  Glucose, capillary     Status: Abnormal   Collection Time: 08/12/14  6:57 AM  Result Value Ref Range   Glucose-Capillary 127 (H) 65 - 99 mg/dL  Glucose, capillary     Status: Abnormal   Collection Time: 08/12/14 11:53 AM  Result Value Ref Range   Glucose-Capillary 106 (H) 65 - 99 mg/dL  Glucose, capillary     Status: Abnormal   Collection Time: 08/12/14  4:28 PM  Result Value Ref Range   Glucose-Capillary 106 (H) 65 - 99 mg/dL  Glucose, capillary     Status: Abnormal   Collection Time: 08/12/14  8:54 PM  Result Value Ref Range   Glucose-Capillary  108 (H) 65 - 99 mg/dL  Glucose, capillary     Status: Abnormal   Collection Time: 08/13/14  6:52 AM  Result Value Ref Range   Glucose-Capillary 119 (H) 65 - 99 mg/dL  Glucose, capillary     Status: None   Collection Time: 08/13/14 11:58 AM  Result Value Ref Range   Glucose-Capillary 87 65 - 99 mg/dL  Glucose, capillary     Status: Abnormal   Collection Time: 08/13/14  4:29 PM  Result Value Ref Range   Glucose-Capillary 104 (H) 65 - 99 mg/dL  Glucose, capillary     Status: Abnormal   Collection Time: 08/13/14  9:22 PM  Result Value Ref Range   Glucose-Capillary 123 (H) 65 - 99 mg/dL      HEENT: Left sided neck incision with gauze dressing no swelling, minimal eccymosis Cardio: RRR and no murmur Resp: CTA B/L and unlabored GI: BS positive and NT, ND Extremity:  Pulses positive and No Edema Skin:   Bruise ecchymosis , left dorsum of hand as well as right forearm Neuro: Alert/Oriented, Abnormal Sensory absent LT in LUE, Abnormal Motor 3- Left delt ,3- bi, 2- tri, , 0/5 in Left hand3-HF KE, Inattention and Other Left field cut Musc/Skel:  Swelling dorsum LEft hand improving, ecchymosis--coban wrap in place Gen NAD   Assessment/Plan: 1. Functional deficits secondary to Right MCA infarct  with Left hemiparesis, left neglect and anosognsia which require 3+ hours per day of interdisciplinary therapy in a comprehensive inpatient rehab setting. Physiatrist is providing close team supervision and 24 hour management of active medical problems listed below. Physiatrist and rehab team continue to assess barriers to discharge/monitor patient progress toward functional and medical goals. Discussed SNF with pt and if bed available may go sooner than d/c date      FIM: FIM - Bathing Bathing Steps Patient Completed: Chest, Left Arm, Abdomen, Buttocks, Right upper leg, Right lower leg (including foot), Left upper leg, Left lower leg (including foot), Front perineal area, Right Arm Bathing: 5:  Supervision: Safety issues/verbal cues  FIM - Upper Body Dressing/Undressing Upper body dressing/undressing steps patient completed: Thread/unthread right sleeve of pullover shirt/dresss, Thread/unthread left sleeve of pullover shirt/dress, Put head through opening of pull over shirt/dress, Pull shirt over trunk Upper body dressing/undressing: 3: Mod-Patient completed 50-74% of tasks FIM - Lower Body Dressing/Undressing Lower body dressing/undressing steps patient completed: Thread/unthread right underwear leg, Thread/unthread left underwear leg, Thread/unthread right pants leg, Thread/unthread left pants leg, Don/Doff right shoe Lower body dressing/undressing: 3: Mod-Patient completed 50-74% of tasks  FIM - Toileting Toileting steps completed by patient: Performs perineal hygiene, Adjust clothing prior to toileting Toileting Assistive Devices: Grab bar or rail for support Toileting: 3: Mod-Patient completed 2 of 3 steps  FIM - Radio producer Devices: Oncologist Transfers: 5-To toilet/BSC: Supervision (verbal cues/safety issues), 5-From toilet/BSC: Supervision (verbal cues/safety issues)  FIM - Control and instrumentation engineer Devices: Best boy: 5: Chair or W/C > Bed: Supervision (verbal cues/safety issues)  FIM - Locomotion: Wheelchair Distance: 60 Locomotion: Wheelchair: 1: Total Assistance/staff pushes wheelchair (Pt<25%) FIM - Locomotion: Ambulation Locomotion: Ambulation Assistive Devices: Administrator Ambulation/Gait Assistance: 4: Min assist Locomotion: Ambulation: 4: Travels 150 ft or more with minimal assistance (Pt.>75%)  Comprehension Comprehension Mode: Auditory Comprehension: 6-Follows complex conversation/direction: With extra time/assistive device  Expression Expression Mode: Verbal Expression: 5-Expresses complex 90% of the time/cues < 10% of the time  Social Interaction Social Interaction:  6-Interacts appropriately with others with medication or extra time (anti-anxiety, antidepressant).  Problem Solving Problem Solving: 4-Solves basic 75 - 89% of the time/requires cueing 10 - 24% of the time  Memory Memory: 4-Recognizes or recalls 75 - 89% of the time/requires cueing 10 - 24% of the time  Medical Problem List and Plan: 1. Functional deficits secondary to right MCA infarct 2.  DVT Prophylaxis/Anticoagulation: Subcutaneous Lovenox for DVT prophylaxis. Monitor for any bleeding episodes 3. Pain Management: Percocet as needed. Monitor with increased mobility 4. Right carotid stenosis. Status post carotid endarterectomy 08/05/2014 5. Neuropsych: This patient is capable of making decisions on her own behalf. 6. Skin/Wound Care: Routine skin checks 7. Fluids/Electrolytes/Nutrition:   I and O's with follow-up chemistries 8. Dysphagia. Dysphagia #2 sent liquids follow-up speech therapy, adequate po fluid intake 9. Hypertension. Lisinopril 5 mg daily. M  10. Diabetes mellitus. Hemoglobin A1c 8.5. Glucophage 500 mg twice a day.   -blood sugars well controlled 11. Hypothyroidism. Synthroid 12. Hyperlipidemia. Lipitor 13. IV infiltration left hand. Conservative care/wrapping,elevation, ecchymosis reducing                                               14. 6 mm pulmonary nodule identified on CT and GL chest. Recommendations follow-up CT of the  chest in 6-12 months  LOS (Days) 8 A FACE TO FACE EVALUATION WAS PERFORMED  KIRSTEINS,ANDREW E 08/14/2014, 6:53 AM

## 2014-08-14 NOTE — Progress Notes (Addendum)
Physical Therapy Session Note  Patient Details  Name: Carla Hopkins MRN: [id number] Date of Birth: 1951-03-02  Today's Date: 08/14/2014 PT Individual Time: 1300-1400 PT Individual Time Calculation (min): 60 min  Short Term Goals: Week 3:  PT Short Term Goal 1 (Week 3): = LTGs due to LOS  Skilled Therapeutic Interventions/Progress Updates:  tx focused on L neuro re-ed, L attention, balance, simulated car transfer, gait  Simulated car transfer to sedan ht seat; close supervision, VCs for safe R hand placement sit>< stand.  neuromuscular re-education via demo, VCs, manual cues for: - L hip/pelvic  strengthening for L stance stability, in dynamic standing, standing biased to L, and unsupported sitting -L visual and LE attention, during dual task of kicking orange cone with L foot during ambulation with NBQC, min guard assist except for 1 LOB L .  Pt fatigued after 70'.     Up/down 4" step/threshold with NBQC x 3 with min guard assist, mod cues for sequencing, wider BOS. L foot with poor clearance, but no LOB.  Discussed pt's d/c plan; she continues to have poor insight into her deficits.  She still refuses to consider SNF.  She is unable to afford 24/7 home care.  She is going to see if friends could spend the night with her (if she feels she needs them at d/c) , and she would have hired help during the day.    Gait with NBQC x 60' including 2 turns to R to recliner in room. Pt exhausted.    Due to L inattention, pt required cues for L UE safety for each stand> sit.  All needs left within reach. Therapy Documentation Precautions:  Precautions Precautions: Fall Precaution Comments: severe L sided inattention Restrictions Weight Bearing Restrictions: No   Locomotion : Ambulation Ambulation/Gait Assistance: 4: Min guard    See FIM for current functional status  Therapy/Group: Individual Therapy  Dakiya Puopolo,Terrance 08/14/2014, 2:07 PM

## 2014-08-14 NOTE — Progress Notes (Signed)
Speech Language Pathology Weekly Progress and Session Note  Patient Details  Name: Carla Hopkins MRN: [id number] Date of Birth: 1950/10/07  Beginning of progress report period: Aug 07, 2014 End of progress report period: Aug 14, 2014  Today's Date: 08/14/2014 SLP Individual Time: 1105-1205 SLP Individual Time Calculation (min): 60 min  Short Term Goals: Week 1: SLP Short Term Goal 1 (Week 1): Pt will attend to left body/environment during semi-complex functional tasks with min assist verbal cues SLP Short Term Goal 1 - Progress (Week 1): Met SLP Short Term Goal 2 (Week 1): Pt will complete semi-complex tasks with min assist verbal cues for functional problem solving.   SLP Short Term Goal 2 - Progress (Week 1): Progressing toward goal SLP Short Term Goal 3 (Week 1): Pt will return demonstration of oral motor strengthening exercises targeting left sided weakness with supervision.  SLP Short Term Goal 3 - Progress (Week 1): Discontinued (due to need for extensive focus on cognition) SLP Short Term Goal 4 (Week 1): Pt will utilize slow rate and overarticulation to achieve intelligibility in conversations with supervision.  SLP Short Term Goal 4 - Progress (Week 1): Met SLP Short Term Goal 5 (Week 1): Pt will consume regular textures and thin liquids with mod I self monitoring and correction of left sided buccal residue.   SLP Short Term Goal 5 - Progress (Week 1): Met    New Short Term Goals: Week 2: SLP Short Term Goal 1 (Week 2): Pt will attend to left body/environment during semi-complex functional tasks with min assist visual cues SLP Short Term Goal 2 (Week 2): Pt will complete semi-complex tasks with min assist verbal cues for functional problem solving.   SLP Short Term Goal 3 (Week 2): Pt will utilize slow rate and overarticulation to achieve intelligibility in conversations with mod I.  SLP Short Term Goal 4 (Week 2): Pt will identify and correct errors in the moment with min  assist verbal and visual cues.   SLP Short Term Goal 5 (Week 2): Pt will identify at least 2 physical and/or cognitive changes occurring s/p stroke with mod question cues.    Weekly Progress Updates:  Pt made slow functional gains this reporting period and has met 3 out of 4 targeted short term goals.  Pt requires overall min-mod assist during semi-complex home management and/or self care tasks due to left inattention, poor intellectual/emergent awareness of deficits, and decreased functional problem solving. Pt's progress in therapies has been somewhat limited by her propensity towards giving excuses as to why she can not do what SLP is asking her to do.  Pt also requires frequent re-education regarding the need for 24/7 supervision and follow up ST services at discharge due to decreased awareness of deficits.  Also continue to recommend that pt have assistance for medication and financial management at discharge due to cognition.  Pt is currently consuming regular textures with thin liquids with mod I use of swallowing precautions.  Pt would continue to benefit from skilled ST while inpatient in order to maximize functional independence and reduce burden of care prior to discharge.     Intensity: Minumum of 1-2 x/day, 30 to 90 minutes Frequency: 3 to 5 out of 7 days Duration/Length of Stay: 14-21 days  Treatment/Interventions: Cognitive remediation/compensation;Cueing hierarchy;Dysphagia/aspiration precaution training;Patient/family education;Speech/Language facilitation;Oral motor exercises;Internal/external aids;Environmental controls   Daily Session  Skilled Therapeutic Interventions: Pt was seen for skilled ST targeting cognitive goals.  Upon arrival, pt was seated upright in recliner, awake, alert,  and agreeable to participate in [place].  Pt was transferred to wheelchair and transported to day room to target ognitive skills in a moderately distracting environment.   SLP facilitated the session with  a semi-complex familiar board game targeting functional problem solving and visual scanning to the left of midline.  Pt required overall mod assist verbal cues to plan and execute a problem solving strategy due to decreased mental flexibility and poor error awareness.  Pt also located items on the left side of the board with min verbal cues.  Pt became distracted by environmental stimuli and benefited from min verbal cues for redirection to task.  Goals updated on this date to reflect current progress and plan of care.       FIM:  Comprehension Comprehension Mode: Auditory Comprehension: 6-Follows complex conversation/direction: With extra time/assistive device Expression Expression Mode: Verbal Expression: 5-Expresses complex 90% of the time/cues < 10% of the time Social Interaction Social Interaction: 6-Interacts appropriately with others with medication or extra time (anti-anxiety, antidepressant). Problem Solving Problem Solving: 4-Solves basic 75 - 89% of the time/requires cueing 10 - 24% of the time Memory Memory: 4-Recognizes or recalls 75 - 89% of the time/requires cueing 10 - 24% of the time FIM - Eating Eating Activity: 6: More than reasonable amount of time General    Pain Pain Assessment Pain Assessment: No/denies pain  Therapy/Group: Individual Therapy  Leyton Brownlee, Selinda Orion 08/14/2014, 4:10 PM

## 2014-08-14 NOTE — Progress Notes (Signed)
Occupational Therapy Session Note  Patient Details  Name: Carla Hopkins MRN: [id number] Date of Birth: 09-07-1950  Today's Date: 08/14/2014 OT Individual Time: 7262-0355 OT Individual Time Calculation (min): 54 min    Short Term Goals: Week 2:  OT Short Term Goal 1 (Week 2): Pt will complete a stand pivot transfer to toilet with steadying A. OT Short Term Goal 2 (Week 2): Pt will don a tshirt with S. OT Short Term Goal 3 (Week 2): Pt will be able to don B feet into underwear and pants without A from a seated position. OT Short Term Goal 4 (Week 2): Pt will bathe with S using a long sponge to reach R arm. OT Short Term Goal 5 (Week 2): Pt will stand at sink with steadying A to brush teeth.  Skilled Therapeutic Interventions/Progress Updates:  Upon entering the room, pt seated in wheelchair awaiting therapist. Pt reporting she already changed clothing and declined bathing this session. Pt upset and crying and reporting, "They think I am going to a nursing home but i'm not!". OT educated pt on current goals and recommendation for further rehab services secondary to pt living alone and safety concerns associated with that. Pt verbalizing understanding and stating it was her goals to , "Do better than they think so I can go home instead." Pt propelled via wheelchair to day room secondary to energy conservation and time management. OT educated and demonstrated recommended exercises for retrograde massage and [place] on digits and hand in order to decrease swelling. Pt returning demonstrations with min verbal cues for proper technique. Pt reporting 3/10 pain with increased digit flexion. Pt performing chair push ups with therapist assist to hold L hand in place for weight bearing through L UE/LE during tasks. Pt performing 2 sets of 10. Pt then demonstrating 1 set of 10 STS without use of UE with close supervision. Pt attempting to place hand around cup for large gross grab but unable to extend hand open  enough without assistance. Once hand around cup, pt able to lift cup but unable to release. Pt becoming frustrated with task. Pt ambulating ~ 120 ' with quad cane and close supervision towards room. Pt require min verbal cues to locate room from current location. Pt fatigues and returned to wheelchair with therapist assisting back to room. Pt transferred with supervision from wheelchair to recliner chair. Call bell and all needed items within reach upon exiting the room.   Therapy Documentation Precautions:  Precautions Precautions: Fall Precaution Comments: severe L sided inattention Restrictions Weight Bearing Restrictions: No Vital Signs: Therapy Vitals Temp: 97.5 F (36.4 C) Temp Source: Oral Pulse Rate: 79 Resp:  (18) BP: 131/66 mmHg Patient Position (if appropriate): Lying Oxygen Therapy SpO2: 99 % O2 Device: Not Delivered Pain: Pain Assessment Pain Assessment: No/denies pain  See FIM for current functional status  Therapy/Group: Individual Therapy  Phineas Semen 08/14/2014, 9:56 AM

## 2014-08-15 ENCOUNTER — Inpatient Hospital Stay (HOSPITAL_COMMUNITY): Payer: BLUE CROSS/BLUE SHIELD | Admitting: Physical Therapy

## 2014-08-15 ENCOUNTER — Inpatient Hospital Stay (HOSPITAL_COMMUNITY): Payer: BLUE CROSS/BLUE SHIELD | Admitting: Speech Pathology

## 2014-08-15 ENCOUNTER — Inpatient Hospital Stay (HOSPITAL_COMMUNITY): Payer: BLUE CROSS/BLUE SHIELD

## 2014-08-15 LAB — GLUCOSE, CAPILLARY
GLUCOSE-CAPILLARY: 103 mg/dL — AB (ref 65–99)
GLUCOSE-CAPILLARY: 92 mg/dL (ref 65–99)
Glucose-Capillary: 115 mg/dL — ABNORMAL HIGH (ref 65–99)
Glucose-Capillary: 105 mg/dL — ABNORMAL HIGH (ref 65–99)

## 2014-08-15 NOTE — Progress Notes (Signed)
Physical Therapy Session Note  Patient Details  Name: Carla Hopkins MRN: [id number] Date of Birth: 1950-07-14  Today's Date: 08/15/2014 PT Individual Time: 1300-1400 PT Individual Time Calculation (min): 60 min   Short Term Goals: Week 1:  PT Short Term Goal 1 - Progress (Week 1): Met PT Short Term Goal 2 - Progress (Week 1): Not met PT Short Term Goal 3 - Progress (Week 1): Progressing toward goal PT Short Term Goal 4 - Progress (Week 1): Met Week 2:  PT Short Term Goal 1 (Week 2): pt will perform basic transfers to R and L with supervision, 2 or less cues for safety PT Short Term Goal 1 - Progress (Week 2): Met PT Short Term Goal 2 (Week 2): pt will perform gait x 100' controlled setting, with moderate assistance, LRAD PT Short Term Goal 2 - Progress (Week 2): Met PT Short Term Goal 3 (Week 2): pt will ascend and descend 4 steps with 1 rail with supervision , 2 or less cues for safety PT Short Term Goal 3 - Progress (Week 2): Met PT Short Term Goal 4 (Week 2): pt will perform gait x 100' in community setting, with supervision /min guard assistance, 4 or less cues for avoiding obstacles PT Short Term Goal 4 - Progress (Week 2): Met  Skilled Therapeutic Interventions/Progress Updates:   Pt received in recliner.  Discussed vestibular history and symptoms with patient.  Pt reports h/o vertigo with supine > sit and rolling L and R in bed that lasts for a few minutes that improves when she changes direction x 15 years.  Pt reports these symptoms have not changed since her CVA.  Discussed with pt benefit of vestibular assessment and treatment for symptoms.  Pt reporting that vertigo does not cause her nausea or vomiting and that it does not interfere with her function and independence and does not wish to pursue treatment.  Pt refused further vestibular assessment.  Educated pt on option of obtaining a referral to outpatient PT in the future for vestibular rehabilitation if her symptoms worsen or  do interfere with mobility and independence.  Pt verbalized understanding.  Pt agreeable to gait training outside.    Transported outside in w/c total A.  Performed gait training outside to focus on safety awareness, attention to L environment, gait sequence, changes in gait speed and balance over uneven pavement, up/down curb, across road, and over grass.  Pt required mod A overall with quad cane for balance due to L foot drag; also required mod-max verbal cues for safety and attention to L environment (pt would often veer to the R towards an obstacle vs. Negotiating to the L around obstacle).  Pt required frequent rest breaks due to overall fatigue.  Pt felt she would not be able to complete inclines/decline and requested to return inside.  Once back in gym provided pt with OTAGO HEP for LE strengthening and balance.  Pt return demonstrated 5 reps of each exercise in standing with multiple sitting rest breaks due to LE fatigue.  Left HEP in room above bed to continue to address.  Pt returned to recliner supervision and left with all items within reach.  Therapy Documentation Precautions:  Precautions Precautions: Fall Precaution Comments: severe L sided inattention Restrictions Weight Bearing Restrictions: No Pain: Pain Assessment Pain Assessment: No/denies pain Locomotion : Ambulation Ambulation/Gait Assistance: 4: Min guard;3: Mod assist   See FIM for current functional status  Therapy/Group: Individual Therapy  Raylene Everts [facility] 08/15/2014, 3:21 PM

## 2014-08-15 NOTE — Progress Notes (Signed)
Social Work  Patient ID: Carla Hopkins, female   DOB: [birth year], 64 y.o.   MRN: [id number]  Spoke with pt and did speak with daughter to answer her questions regarding 24 hr care and if any alternatives.  Plan is to work on 24 hr care and pt will hire assist, but wants to know for how long. Gave pt [facility] information she will follow up on. Continue to work on discharge next Friday.

## 2014-08-15 NOTE — Progress Notes (Signed)
Social Work Patient ID: Carla Hopkins, female   DOB: 10/02/1950, 63 y.o.   MRN: [id number] Met with pt and left a message for her daughter-Courtney to inform [employer] will not cover for pt to go to a NH upon discharge form CIR. Have given daughter case manager's-Lynn number to contact regarding her questions.  Pt wants to hire caregivers during the day and have someone She knows stay with her at night time. Will work on discharge plans, both aware pt will need 24 hr care upon discharge from rehab. Pt is working on hired assistance for home. 

## 2014-08-15 NOTE — Progress Notes (Signed)
Speech Language Pathology Daily Session Note  Patient Details  Name: Carla Hopkins MRN: [id number] Date of Birth: April 06, 1950  Today's Date: 08/15/2014 SLP Individual Time: 1100-1200 SLP Individual Time Calculation (min): 60 min  Short Term Goals: Week 2: SLP Short Term Goal 1 (Week 2): Pt will attend to left body/environment during semi-complex functional tasks with min assist visual cues SLP Short Term Goal 2 (Week 2): Pt will complete semi-complex tasks with min assist verbal cues for functional problem solving.   SLP Short Term Goal 3 (Week 2): Pt will utilize slow rate and overarticulation to achieve intelligibility in conversations with mod I.  SLP Short Term Goal 4 (Week 2): Pt will identify and correct errors in the moment with min assist verbal and visual cues.   SLP Short Term Goal 5 (Week 2): Pt will identify at least 2 physical and/or cognitive changes occurring s/p stroke with mod question cues.    Skilled Therapeutic Interventions:  Pt was seen for skilled ST targeting cognitive goals.  Upon arrival, pt was seated upright in recliner, awake, alert, and agreeable to participate in [place].  SLP facilitated the session with a semi-complex new learning activity targeting functional problem solving and working memory.  Pt required min assist verbal cues faded to supervision cues to selectively attend to task for appropriate turn taking.  Pt also benefited from min assist verbal cues for mental flexibility, recall of previously named items, and impulsivity when naming category members during SLP's turn.  Pt reported that she knows that she can not go to SNF.  She is agreeable to hiring paid caregivers and asking friends to stay with her over night.  Pt verbalized understanding of the need to have someone with her at night in case she needed to use the restroom or in the event of an emergency with supervision.  Pt left upright in recliner, all needs left within reach.  Continue per current plan of  care.    FIM:  Comprehension Comprehension Mode: Auditory Comprehension: 6-Follows complex conversation/direction: With extra time/assistive device Expression Expression Mode: Verbal Expression: 5-Expresses complex 90% of the time/cues < 10% of the time Social Interaction Social Interaction: 6-Interacts appropriately with others with medication or extra time (anti-anxiety, antidepressant). Problem Solving Problem Solving: 4-Solves basic 75 - 89% of the time/requires cueing 10 - 24% of the time Memory Memory: 4-Recognizes or recalls 75 - 89% of the time/requires cueing 10 - 24% of the time FIM - Eating Eating Activity: 6: More than reasonable amount of time  Pain Pain Assessment Pain Assessment: No/denies pain  Therapy/Group: Individual Therapy  Chelesa Weingartner, Selinda Orion 08/15/2014, 1:51 PM

## 2014-08-15 NOTE — Progress Notes (Signed)
Physical Therapy Session Note  Patient Details  Name: Carla Hopkins MRN: [id number] Date of Birth: 1951/01/10  Today's Date: 08/15/2014 PT Individual Time:  -  1000-1030   Treatment Time: 30 min  Short Term Goals: Week 3: STGs = LTGs due to ELOS  Skilled Therapeutic Interventions/Progress Updates:    Gait Training: PT instructs pt in ambulation with NBQC 160' x 2 reps req light CGA-close SBA, intermittently - verbal cues for 2 point gait pattern.   Neuromuscular Reeducation: PT instructs pt in Berg Balance Test and pt scores 48/56, indicating moderate risk of falls. PT educates pt to always use quad cane when ambulating.   Continue per PT POC.   Therapy Documentation Precautions:  Precautions Precautions: Fall Precaution Comments: severe L sided inattention Restrictions Weight Bearing Restrictions: No Pain: Pain Assessment Pain Assessment: No/denies pain  Balance: Balance Balance Assessed: Yes Standardized Balance Assessment Standardized Balance Assessment: Berg Balance Test Berg Balance Test Sit to Stand: Able to stand without using hands and stabilize independently Standing Unsupported: Able to stand safely 2 minutes Sitting with Back Unsupported but Feet Supported on Floor or Stool: Able to sit safely and securely 2 minutes Stand to Sit: Sits safely with minimal use of hands Transfers: Able to transfer safely, minor use of hands Standing Unsupported with Eyes Closed: Able to stand 10 seconds safely Standing Ubsupported with Feet Together: Able to place feet together independently and stand for 1 minute with supervision From Standing, Reach Forward with Outstretched Arm: Can reach confidently >25 cm (10") From Standing Position, Pick up Object from Floor: Able to pick up shoe safely and easily From Standing Position, Turn to Look Behind Over each Shoulder: Looks behind from both sides and weight shifts well Turn 360 Degrees: Able to turn 360 degrees safely but  slowly Standing Unsupported, Alternately Place Feet on Step/Stool: Able to stand independently and complete 8 steps >20 seconds Standing Unsupported, One Foot in Front: Able to plae foot ahead of the other independently and hold 30 seconds Standing on One Leg: Tries to lift leg/unable to hold 3 seconds but remains standing independently Total Score: 48  See FIM for current functional status  Therapy/Group: Individual Therapy  Uchenna Rappaport M 08/15/2014, 8:34 AM

## 2014-08-15 NOTE — Progress Notes (Signed)
Occupational Therapy Weekly Progress Note  Patient Details  Name: Carla Hopkins MRN: [id number] Date of Birth: [birth year]  Beginning of progress report period: Aug 08, 2014 End of progress report period: Aug 15, 2014  Today's Date: 08/15/2014 OT Individual Time: 0830-0930 OT Individual Time Calculation (min): 60 min   Patient has met 4 of 5 short term goals.  Pt is making good progress with performance of transfer and mobility but remains inattentive with position of LUE during BADL.    Patient continues to demonstrate the following deficits: Impaired LUE function (paraparesis), impaired performance of self-care (dressing, specifically), edema control (left hand), and pain managment and therefore will continue to benefit from skilled OT intervention to enhance overall performance with BADL and iADL.  Patient progressing toward long term goals..  Continue plan of care.  OT Short Term Goals Week 2:  OT Short Term Goal 1 (Week 2): Pt will complete a stand pivot transfer to toilet with steadying A. OT Short Term Goal 1 - Progress (Week 2): Met OT Short Term Goal 2 (Week 2): Pt will don a tshirt with S. OT Short Term Goal 2 - Progress (Week 2): Partly met OT Short Term Goal 3 (Week 2): Pt will be able to don B feet into underwear and pants without A from a seated position. OT Short Term Goal 3 - Progress (Week 2): Met OT Short Term Goal 4 (Week 2): Pt will bathe with S using a long sponge to reach R arm. OT Short Term Goal 4 - Progress (Week 2): Met OT Short Term Goal 5 (Week 2): Pt will stand at sink with steadying A to brush teeth. OT Short Term Goal 5 - Progress (Week 2): Met Week 3:  OT Short Term Goal 1 (Week 3): STG=LTG d/t short remaining LOS  Skilled Therapeutic Interventions/Progress Updates:  ADL-retraining with focus on improved standing balance, attention to left, and edema control of left hand.   Pt received in w/c and requesting female for assist with BADL.   RN observed pt  bathing with OT at standby assist to accomdate pt's modesty.   Pt continues to require assist to don shirt, specifically with lacing left UE but demo's good standing balance while toileting and dressing.   Per pt, her plan is to pay out-of-pocket as needed for assist with BADL at her home and/or request assist from family/friends nearby.   Per MD order, coban wrap was removed and edema control was provided with pt instructed on performance of retrograde massage.   OT noted pt's poor management of LUE during BADL however she maintains that she is aware of position of her arm and is not concerned with possible injury as she feels she is managing it well despite repeated cues from therapist (question awareness).    Pt was provided towel bolster for arm trough with velcro closure to maintain position at end of session.     Therapy Documentation Precautions:  Precautions Precautions: Fall Precaution Comments: severe L sided inattention Restrictions Weight Bearing Restrictions: No   Vital Signs: Therapy Vitals Temp: 98.7 F (37.1 C) Temp Source: Oral Pulse Rate: 72 Resp: 17 BP: (!) 120/56 mmHg Patient Position (if appropriate): Lying Oxygen Therapy SpO2: 99 % O2 Device: Not Delivered   Pain: Pain Assessment Pain Assessment: No/denies pain   See FIM for current functional status  Therapy/Group: Individual Therapy  [place] 08/16/2014, 7:25 AM

## 2014-08-15 NOTE — Progress Notes (Addendum)
64 y.o. right handed female with history of tobacco abuse, hypertension. Patient lives alone independently prior to admission and working full time. Admitted 07/24/2014 after a fall noting left-sided weakness. No head trauma no loss of consciousness. MRI of the brain shows multifocal patchy acute right MCA territory infarcts underwent right carotid surgery 08/05/2014 per Dr. Oneida Alar  Subjective/Complaints: Hand feels better but still swollen, not elevating very high Review of Systems - Negative except Left arm weak   Objective: Vital Signs: Blood pressure 112/59, pulse 75, temperature 98.4 F (36.9 C), temperature source Oral, resp. rate 18, height 5\' 2"  (1.575 m), weight 94.5 kg (208 lb 5.4 oz), SpO2 96 %. No results found. Results for orders placed or performed during the hospital encounter of 08/06/14 (from the past 72 hour(s))  Glucose, capillary     Status: Abnormal   Collection Time: 08/12/14  6:57 AM  Result Value Ref Range   Glucose-Capillary 127 (H) 65 - 99 mg/dL  Glucose, capillary     Status: Abnormal   Collection Time: 08/12/14 11:53 AM  Result Value Ref Range   Glucose-Capillary 106 (H) 65 - 99 mg/dL  Glucose, capillary     Status: Abnormal   Collection Time: 08/12/14  4:28 PM  Result Value Ref Range   Glucose-Capillary 106 (H) 65 - 99 mg/dL  Glucose, capillary     Status: Abnormal   Collection Time: 08/12/14  8:54 PM  Result Value Ref Range   Glucose-Capillary 108 (H) 65 - 99 mg/dL  Glucose, capillary     Status: Abnormal   Collection Time: 08/13/14  6:52 AM  Result Value Ref Range   Glucose-Capillary 119 (H) 65 - 99 mg/dL  Glucose, capillary     Status: None   Collection Time: 08/13/14 11:58 AM  Result Value Ref Range   Glucose-Capillary 87 65 - 99 mg/dL  Glucose, capillary     Status: Abnormal   Collection Time: 08/13/14  4:29 PM  Result Value Ref Range   Glucose-Capillary 104 (H) 65 - 99 mg/dL  Glucose, capillary     Status: Abnormal   Collection Time:  08/13/14  9:22 PM  Result Value Ref Range   Glucose-Capillary 123 (H) 65 - 99 mg/dL  Glucose, capillary     Status: Abnormal   Collection Time: 08/14/14  6:39 AM  Result Value Ref Range   Glucose-Capillary 103 (H) 65 - 99 mg/dL  Glucose, capillary     Status: None   Collection Time: 08/14/14 12:14 PM  Result Value Ref Range   Glucose-Capillary 76 65 - 99 mg/dL   Comment 1 Notify RN    Comment 2 Document in Chart   Glucose, capillary     Status: Abnormal   Collection Time: 08/14/14  4:19 PM  Result Value Ref Range   Glucose-Capillary 113 (H) 65 - 99 mg/dL   Comment 1 Notify RN   Glucose, capillary     Status: Abnormal   Collection Time: 08/14/14  8:56 PM  Result Value Ref Range   Glucose-Capillary 117 (H) 65 - 99 mg/dL      HEENT: Left sided neck incision healing minimal eccymosis Cardio: RRR and no murmur Resp: CTA B/L and unlabored GI: BS positive and NT, ND Extremity:  Pulses positive and No Edema Skin:   Bruise ecchymosis , left dorsum of hand  Neuro: Alert/Oriented, Abnormal Sensory absent LT in LUE, Abnormal Motor 3- Left delt ,3- bi, 2- tri, , 0/5 in Left hand3-HF KE, Inattention and Other Left field cut  Musc/Skel:  Swelling dorsum LEft hand improving, ecchymosis--coban wrap in place Gen NAD   Assessment/Plan: 1. Functional deficits secondary to Right MCA infarct with Left hemiparesis, left neglect and anosognsia which require 3+ hours per day of interdisciplinary therapy in a comprehensive inpatient rehab setting. Physiatrist is providing close team supervision and 24 hour management of active medical problems listed below. Physiatrist and rehab team continue to assess barriers to discharge/monitor patient progress toward functional and medical goals.        FIM: FIM - Bathing Bathing Steps Patient Completed: Chest, Left Arm, Abdomen, Buttocks, Right upper leg, Right lower leg (including foot), Left upper leg, Left lower leg (including foot), Front perineal area,  Right Arm Bathing: 5: Supervision: Safety issues/verbal cues  FIM - Upper Body Dressing/Undressing Upper body dressing/undressing steps patient completed: Thread/unthread right sleeve of pullover shirt/dresss, Thread/unthread left sleeve of pullover shirt/dress, Put head through opening of pull over shirt/dress, Pull shirt over trunk Upper body dressing/undressing: 3: Mod-Patient completed 50-74% of tasks FIM - Lower Body Dressing/Undressing Lower body dressing/undressing steps patient completed: Thread/unthread right underwear leg, Thread/unthread left underwear leg, Thread/unthread right pants leg, Thread/unthread left pants leg, Don/Doff right shoe Lower body dressing/undressing: 3: Mod-Patient completed 50-74% of tasks  FIM - Toileting Toileting steps completed by patient: Performs perineal hygiene, Adjust clothing prior to toileting Toileting Assistive Devices: Grab bar or rail for support Toileting: 3: Mod-Patient completed 2 of 3 steps  FIM - Radio producer Devices: Oncologist Transfers: 5-To toilet/BSC: Supervision (verbal cues/safety issues), 5-From toilet/BSC: Supervision (verbal cues/safety issues)  FIM - Control and instrumentation engineer Devices: Best boy: 5: Bed > Chair or W/C: Supervision (verbal cues/safety issues), 5: Chair or W/C > Bed: Supervision (verbal cues/safety issues)  FIM - Locomotion: Wheelchair Distance: 60 Locomotion: Wheelchair: 1: Total Assistance/staff pushes wheelchair (Pt<25%) FIM - Locomotion: Ambulation Locomotion: Ambulation Assistive Devices: Nurse, adult Ambulation/Gait Assistance: 4: Min guard Locomotion: Ambulation: 2: Travels 50 - 149 ft with minimal assistance (Pt.>75%)  Comprehension Comprehension Mode: Auditory Comprehension: 6-Follows complex conversation/direction: With extra time/assistive device  Expression Expression Mode: Verbal Expression: 5-Expresses complex 90% of the  time/cues < 10% of the time  Social Interaction Social Interaction: 6-Interacts appropriately with others with medication or extra time (anti-anxiety, antidepressant).  Problem Solving Problem Solving: 4-Solves basic 75 - 89% of the time/requires cueing 10 - 24% of the time  Memory Memory: 4-Recognizes or recalls 75 - 89% of the time/requires cueing 10 - 24% of the time  Medical Problem List and Plan: 1. Functional deficits secondary to right MCA infarct 2.  DVT Prophylaxis/Anticoagulation: Subcutaneous Lovenox for DVT prophylaxis. Monitor for any bleeding episodes 3. Pain Management: Percocet as needed. Monitor with increased mobility 4. Right carotid stenosis. Status post carotid endarterectomy 08/05/2014 5. Neuropsych: This patient is capable of making decisions on her own behalf. 6. Skin/Wound Care: Routine skin checks 7. Fluids/Electrolytes/Nutrition:   I and O's with follow-up chemistries 8. Dysphagia. Dysphagia #2 sent liquids follow-up speech therapy, adequate po fluid intake 9. Hypertension. Lisinopril 5 mg daily. M  10. Diabetes mellitus. Hemoglobin A1c 8.5. Glucophage 500 mg twice a day.   -blood sugars well controlled 11. Hypothyroidism. Synthroid 12. Hyperlipidemia. Lipitor 13. IV infiltration left hand. Conservative care/wrapping,elevation, ecchymosis reducing, Remove dressing today and do retrograde massage  14. 6 mm pulmonary nodule identified on CT and GL chest. Recommendations follow-up CT of the chest in 6-12 months  LOS (Days) 9 A FACE TO FACE EVALUATION WAS PERFORMED  KIRSTEINS,ANDREW E 08/15/2014, 6:49 AM

## 2014-08-16 ENCOUNTER — Inpatient Hospital Stay (HOSPITAL_COMMUNITY): Payer: BLUE CROSS/BLUE SHIELD | Admitting: Occupational Therapy

## 2014-08-16 DIAGNOSIS — R911 Solitary pulmonary nodule: Secondary | ICD-10-CM

## 2014-08-16 DIAGNOSIS — E1165 Type 2 diabetes mellitus with hyperglycemia: Secondary | ICD-10-CM

## 2014-08-16 LAB — GLUCOSE, CAPILLARY
GLUCOSE-CAPILLARY: 91 mg/dL (ref 65–99)
GLUCOSE-CAPILLARY: 159 mg/dL — AB (ref 65–99)
Glucose-Capillary: 115 mg/dL — ABNORMAL HIGH (ref 65–99)
Glucose-Capillary: 110 mg/dL — ABNORMAL HIGH (ref 65–99)

## 2014-08-16 NOTE — Progress Notes (Signed)
Occupational Therapy Session Note  Patient Details  Name: Carla Hopkins MRN: [id number] Date of Birth: Oct 20, 1950  Today's Date: 08/16/2014 OT Individual Time: 1330-1400 OT Individual Time Calculation (min): 30 min    Short Term Goals: Week 1:  OT Short Term Goal 1 (Week 1): Pt will bathe with min A. OT Short Term Goal 1 - Progress (Week 1): Met OT Short Term Goal 2 (Week 1): Pt will toilet with min A. OT Short Term Goal 2 - Progress (Week 1): Progressing toward goal OT Short Term Goal 3 (Week 1): Pt will don shirt min A. OT Short Term Goal 3 - Progress (Week 1): Progressing toward goal OT Short Term Goal 4 (Week 1): Pt will don pants with mod A. OT Short Term Goal 4 - Progress (Week 1): Met OT Short Term Goal 5 (Week 1): Pt will be independent with self ROM of LUE. OT Short Term Goal 5 - Progress (Week 1): Progressing toward goal  Skilled Therapeutic Interventions/Progress Updates:  Upon entering the room, pt seated in recliner chair with no c/o pain this session. OT performed retrograde massage to digits and dorsal part of L hand during session. Pt reporting slight discomfort especially over dorsum of hand. Pt donned B shoes and utilized shoe buttons to fasten laces. Pt ambulating ~ 22' with quad cane and supervision. Pt required seated rest break and then ambulated ~ 30 feet back towards room before being assisted back via wheelchair as pt reports, "I am just too tired and weak." Pt ambulated to bathroom for toileting with supervision. Clothing management and hygiene performed with supervision as well. Pt seated in wheelchair with call bell and all needed items within reach.   Therapy Documentation Precautions:  Precautions Precautions: Fall Precaution Comments: severe L sided inattention Restrictions Weight Bearing Restrictions: No General:   Vital Signs: Therapy Vitals Temp: 98.4 F (36.9 C) Temp Source: Oral Pulse Rate: 80 Resp: 18 BP: 122/67 mmHg Patient Position (if  appropriate): Sitting Oxygen Therapy SpO2: 94 % O2 Device: Not Delivered  See FIM for current functional status  Therapy/Group: Individual Therapy  Phineas Semen 08/16/2014, 2:51 PM

## 2014-08-16 NOTE — Progress Notes (Signed)
Carla Hopkins is a 64 y.o. female October 20, 1950 [id number]  Subjective: No new complaints. No new problems. Slept well. Feeling well. Anxious for independence (feels no need for supervision to BR)  Objective: Vital signs in last 24 hours: Temp:  [98.4 F (36.9 C)-98.7 F (37.1 C)] 98.7 F (37.1 C) (05/21 0500) Pulse Rate:  [71-72] 72 (05/21 0500) Resp:  [17-18] 17 (05/21 0500) BP: (120-148)/(56-80) 120/56 mmHg (05/21 0500) SpO2:  [95 %-99 %] 99 % (05/21 0500) Weight change:  Last BM Date: 08/15/14  Intake/Output from previous day: 05/20 0701 - 05/21 0700 In: 720 [P.O.:720] Out: -   Physical Exam General: No apparent distress   In [facility] Lungs: Normal effort. Lungs clear to auscultation, no crackles or wheezes. Cardiovascular: Regular rate and rhythm, no edema Musculoskeletal:  Neurovascularly intact Neurological: No new neurological deficits Wounds: L hand dorsal hematoma - resolving - no symptoms infection or cellulitis  Lab Results: BMET    Component Value Date/Time   NA 138 08/07/2014 0625   K 4.3 08/07/2014 0625   CL 105 08/07/2014 0625   CO2 25 08/07/2014 0625   GLUCOSE 150* 08/07/2014 0625   BUN 13 08/07/2014 0625   CREATININE 0.81 08/07/2014 0625   CREATININE 0.75 07/03/2014 1705   CALCIUM 9.1 08/07/2014 0625   GFRNONAA >60 08/07/2014 0625   GFRAA >60 08/07/2014 0625   CBC    Component Value Date/Time   WBC 12.0* 08/07/2014 0625   WBC 9.2 05/19/2013 1232   RBC 4.40 08/07/2014 0625   RBC 4.67 05/19/2013 1232   HGB 12.5 08/07/2014 0625   HGB 13.9 05/19/2013 1232   HCT 38.6 08/07/2014 0625   HCT 45.1 05/19/2013 1232   PLT 226 08/07/2014 0625   MCV 87.7 08/07/2014 0625   MCV 96.5 05/19/2013 1232   MCH 28.4 08/07/2014 0625   MCH 29.8 05/19/2013 1232   MCHC 32.4 08/07/2014 0625   MCHC 30.8* 05/19/2013 1232   RDW 15.5 08/07/2014 0625   LYMPHSABS 2.3 08/07/2014 0625   MONOABS 1.0 08/07/2014 0625   EOSABS 0.3 08/07/2014 0625   BASOSABS 0.1 08/07/2014 0625    CBG's (last 3):   Recent Labs  08/15/14 1640 08/15/14 2042 08/16/14 0648  GLUCAP 92 105* 115*   LFT's Lab Results  Component Value Date   ALT 24 08/07/2014   AST 21 08/07/2014   ALKPHOS 66 08/07/2014   BILITOT 0.8 08/07/2014    Studies/Results: No results found.  Medications:  I have reviewed the patient's current medications. Scheduled Medications: . aspirin  81 mg Oral Daily  . atorvastatin  80 mg Oral q1800  . citalopram  10 mg Oral Daily  . docusate sodium  100 mg Oral Daily  . enoxaparin (LOVENOX) injection  30 mg Subcutaneous Q24H  . insulin aspart  0-9 Units Subcutaneous TID WC  . levothyroxine  88 mcg Oral QAC breakfast  . lisinopril  5 mg Oral Daily  . metFORMIN  500 mg Oral BID WC  . pantoprazole  40 mg Oral Daily  . Vitamin D (Ergocalciferol)  50,000 Units Oral Q Wed   PRN Medications: acetaminophen **OR** acetaminophen, bisacodyl, guaiFENesin-dextromethorphan, methocarbamol, ondansetron **OR** ondansetron (ZOFRAN) IV, oxyCODONE-acetaminophen, phenol, senna-docusate, sorbitol  Assessment/Plan: Active Problems:   Left-sided weakness   Essential hypertension   History of right MCA stroke  1. Functional deficits secondary to right MCA infarct 4/28. S/p R CEA 08/05/14 2. DVT Prophylaxis/Anticoagulation: Subcutaneous Lovenox for DVT prophylaxis. Monitor for any bleeding episodes 3. Pain Management: Percocet as needed. Monitor  with increased mobility 4. Right carotid stenosis. Status post carotid endarterectomy 08/05/2014 5. Neuropsych: This patient is capable of making decisions on her own behalf. Continue citalopram 6. Skin/Wound Care: Routine skin checks 7. Fluids/Electrolytes/Nutrition: I and O's with follow-up chemistries 8. Dysphagia. Dysphagia #2 sent liquids follow-up speech therapy, adequate po fluid intake 9. Hypertension. Lisinopril 5 mg daily.  10. Diabetes mellitus. Hemoglobin A1c 8.5. Glucophage 500 mg twice a day.  -blood  sugars well controlled 11. Hypothyroidism. Synthroid 12. Hyperlipidemia. Lipitor 13. IV infiltration left hand. Conservative care/wrapping,elevation, ecchymosis reducing, retrograde massage  14. COPD and 6 mm pulmonary nodule RML identified on CT angio chest 4/28. Recommendations follow-up CT of the chest in 6-12 months  Length of stay, days: 10   Rheya Minogue A. Asa Lente, MD 08/16/2014, 10:00 AM

## 2014-08-17 ENCOUNTER — Inpatient Hospital Stay (HOSPITAL_COMMUNITY): Payer: BLUE CROSS/BLUE SHIELD | Admitting: Occupational Therapy

## 2014-08-17 DIAGNOSIS — I63511 Cerebral infarction due to unspecified occlusion or stenosis of right middle cerebral artery: Principal | ICD-10-CM

## 2014-08-17 LAB — GLUCOSE, CAPILLARY
GLUCOSE-CAPILLARY: 127 mg/dL — AB (ref 65–99)
Glucose-Capillary: 119 mg/dL — ABNORMAL HIGH (ref 65–99)
Glucose-Capillary: 95 mg/dL (ref 65–99)
Glucose-Capillary: 115 mg/dL — ABNORMAL HIGH (ref 65–99)

## 2014-08-17 NOTE — Plan of Care (Signed)
Problem: RH KNOWLEDGE DEFICIT Goal: RH STG INCREASE KNOWLEDGE OF DIABETES Patient able to verbalize s/s of hyper/hypoglycemia. Able to demonstrate understanding of prescribed medications, treatments, and appropriate diet with minimal assistance.  Outcome: Progressing Patient very receptive to diabetes teaching; RN gave handouts re: diabetes ; provided educational video for patient to watch; MD ordered dietician consult

## 2014-08-17 NOTE — Progress Notes (Signed)
Occupational Therapy Session Note  Patient Details  Name: Carla Hopkins MRN: [id number] Date of Birth: [birth year]  Today's Date: 08/17/2014   AM OT Individual Time: 0930-1030 OT Individual Time Calculation (min): 60 min    Skilled Therapeutic Interventions/Progress Updates: ADL in shower working around her hematoma on the volar aspect of her L hand...she stated it burst and "bleed all over me last night."    She stated the L hand is less painful since it bled out some.  Staff attended to it and wrapped it afterwards. During ADL she and this clinician were able to cover it with plastic before shower and keep the water from getting to the bandage/gauze.  Otherwise, focus of the session was to attend to left environment and body for self care and functional mobility (via quad cane).  Patient utilized extra time to wash and dress left side of body as she stated, "I have too much of me to be able to reach the left side easily."  PM Session 1:45 to 2:30 patient completed L UE ROM shoulder down to digits ( up to 90 degrees shoulder flexion as her right shoulder appears somewhat instable muscularly today).     Total time calculation=105 minutes  Therapy Documentation Precautions:  Precautions Precautions: Fall Precaution Comments: severe L sided inattention Restrictions Weight Bearing Restrictions: No Pain: "soreness" on back of swollen left hand    Therapy/Group: Individual Therapy  Herschell Dimes 08/17/2014, 2:51 PM

## 2014-08-17 NOTE — Progress Notes (Signed)
Carla Hopkins is a 64 y.o. female 11/28/1950 [id number]  Subjective: No new complaints. No new problems. Slept well. Feeling well. Asking about new PCP after DC due to retirement of current doc   Objective: Vital signs in last 24 hours: Temp:  [98.4 F (36.9 C)-98.6 F (37 C)] 98.6 F (37 C) (05/22 0541) Pulse Rate:  [72-80] 72 (05/22 0541) Resp:  [18] 18 (05/22 0541) BP: (107-122)/(55-67) 107/55 mmHg (05/22 0541) SpO2:  [94 %-95 %] 95 % (05/22 0541) Weight change:  Last BM Date: 08/16/14  Intake/Output from previous day: 05/21 0701 - 05/22 0700 In: 960 [P.O.:960] Out: -   Physical Exam General: No apparent distress   In [facility] Lungs: Normal effort. Lungs clear to auscultation, no crackles or wheezes. Cardiovascular: Regular rate and rhythm, no edema Musculoskeletal:  Neurovascularly intact Neurological: No new neurological deficits Wounds: L hand dorsal hematoma - resolving - no symptoms infection or cellulitis  Lab Results: BMET    Component Value Date/Time   NA 138 08/07/2014 0625   K 4.3 08/07/2014 0625   CL 105 08/07/2014 0625   CO2 25 08/07/2014 0625   GLUCOSE 150* 08/07/2014 0625   BUN 13 08/07/2014 0625   CREATININE 0.81 08/07/2014 0625   CREATININE 0.75 07/03/2014 1705   CALCIUM 9.1 08/07/2014 0625   GFRNONAA >60 08/07/2014 0625   GFRAA >60 08/07/2014 0625   CBC    Component Value Date/Time   WBC 12.0* 08/07/2014 0625   WBC 9.2 05/19/2013 1232   RBC 4.40 08/07/2014 0625   RBC 4.67 05/19/2013 1232   HGB 12.5 08/07/2014 0625   HGB 13.9 05/19/2013 1232   HCT 38.6 08/07/2014 0625   HCT 45.1 05/19/2013 1232   PLT 226 08/07/2014 0625   MCV 87.7 08/07/2014 0625   MCV 96.5 05/19/2013 1232   MCH 28.4 08/07/2014 0625   MCH 29.8 05/19/2013 1232   MCHC 32.4 08/07/2014 0625   MCHC 30.8* 05/19/2013 1232   RDW 15.5 08/07/2014 0625   LYMPHSABS 2.3 08/07/2014 0625   MONOABS 1.0 08/07/2014 0625   EOSABS 0.3 08/07/2014 0625   BASOSABS 0.1 08/07/2014 0625    CBG's (last 3):    Recent Labs  08/16/14 1624 08/16/14 2045 08/17/14 0653  GLUCAP 159* 110* 119*   LFT's Lab Results  Component Value Date   ALT 24 08/07/2014   AST 21 08/07/2014   ALKPHOS 66 08/07/2014   BILITOT 0.8 08/07/2014    Studies/Results: No results found.  Medications:  I have reviewed the patient's current medications. Scheduled Medications: . aspirin  81 mg Oral Daily  . atorvastatin  80 mg Oral q1800  . citalopram  10 mg Oral Daily  . docusate sodium  100 mg Oral Daily  . enoxaparin (LOVENOX) injection  30 mg Subcutaneous Q24H  . insulin aspart  0-9 Units Subcutaneous TID WC  . levothyroxine  88 mcg Oral QAC breakfast  . lisinopril  5 mg Oral Daily  . metFORMIN  500 mg Oral BID WC  . pantoprazole  40 mg Oral Daily  . Vitamin D (Ergocalciferol)  50,000 Units Oral Q Wed   PRN Medications: acetaminophen **OR** acetaminophen, bisacodyl, guaiFENesin-dextromethorphan, methocarbamol, ondansetron **OR** ondansetron (ZOFRAN) IV, oxyCODONE-acetaminophen, phenol, senna-docusate, sorbitol  Assessment/Plan: Active Problems:   Left-sided weakness   Essential hypertension   History of right MCA stroke  1. Functional deficits secondary to right MCA infarct 4/28. S/p R CEA 08/05/14 2. DVT Prophylaxis/Anticoagulation: Subcutaneous Lovenox for DVT prophylaxis. Monitor for any bleeding episodes 3. Pain Management:  Percocet as needed. Monitor with increased mobility 4. Right carotid stenosis. Status post carotid endarterectomy 08/05/2014 5. Neuropsych: This patient is capable of making decisions on her own behalf. Continue citalopram 6. Skin/Wound Care: Routine skin checks 7. Fluids/Electrolytes/Nutrition: I and O's with follow-up chemistries 8. Dysphagia. Dysphagia #2 sent liquids follow-up speech therapy, adequate po fluid intake 9. Hypertension. Lisinopril 5 mg daily.  10. Diabetes mellitus. Hemoglobin A1c 8.5. Glucophage 500 mg twice a day.   -blood sugars well controlled IP  -reports no diagnosis of DM prior to admission? a1c 7.9 on 4/7 PTA. Will arrange dietary consult before DC and OP follow up as pt requesting new PCP 11. Hypothyroidism. Synthroid 12. Hyperlipidemia. Lipitor 13. IV infiltration left hand. Conservative care/wrapping,elevation, ecchymosis reducing, retrograde massage  14. COPD and 6 mm pulmonary nodule RML identified on CT angio chest 4/28. Recommendations follow-up CT of the chest in 6-12 months  Length of stay, days: 11   Valerie A. Asa Lente, MD 08/17/2014, 9:33 AM

## 2014-08-17 NOTE — Progress Notes (Signed)
Blood oozing from left hand. Film dressing already in place, did not remove. Applied 4 x 4 and wrapped with kerlix. LUE elevated on pillow. Didn't apply left resting hand splint. 4-5 stools after sorbitol given on previous shift. Right neck incision healing without S & S of infection, OTA. Carla Hopkins A

## 2014-08-18 ENCOUNTER — Inpatient Hospital Stay (HOSPITAL_COMMUNITY): Payer: BLUE CROSS/BLUE SHIELD | Admitting: Occupational Therapy

## 2014-08-18 ENCOUNTER — Inpatient Hospital Stay (HOSPITAL_COMMUNITY): Payer: BLUE CROSS/BLUE SHIELD | Admitting: Speech Pathology

## 2014-08-18 ENCOUNTER — Inpatient Hospital Stay (HOSPITAL_COMMUNITY): Payer: BLUE CROSS/BLUE SHIELD

## 2014-08-18 LAB — GLUCOSE, CAPILLARY
GLUCOSE-CAPILLARY: 126 mg/dL — AB (ref 65–99)
GLUCOSE-CAPILLARY: 99 mg/dL (ref 65–99)
GLUCOSE-CAPILLARY: 93 mg/dL (ref 65–99)
Glucose-Capillary: 116 mg/dL — ABNORMAL HIGH (ref 65–99)

## 2014-08-18 NOTE — Progress Notes (Signed)
Speech Language Pathology Daily Session Note  Patient Details  Name: Carla Hopkins MRN: [id number] Date of Birth: September 13, 1950  Today's Date: 08/18/2014 SLP Individual Time: 1030-1100 SLP Individual Time Calculation (min): 30 min  Short Term Goals: Week 2: SLP Short Term Goal 1 (Week 2): Pt will attend to left body/environment during semi-complex functional tasks with min assist visual cues SLP Short Term Goal 2 (Week 2): Pt will complete semi-complex tasks with min assist verbal cues for functional problem solving.   SLP Short Term Goal 3 (Week 2): Pt will utilize slow rate and overarticulation to achieve intelligibility in conversations with mod I.  SLP Short Term Goal 4 (Week 2): Pt will identify and correct errors in the moment with min assist verbal and visual cues.   SLP Short Term Goal 5 (Week 2): Pt will identify at least 2 physical and/or cognitive changes occurring s/p stroke with mod question cues.    Skilled Therapeutic Interventions:  Pt was seen for skilled ST targeting cognitive goals. Upon arrival, pt was seated upright in recliner, awake, alert, and agreeable to participate in [place].  Pt requested to use the restroom appropriately and complied with safety precautions when ambulating to and from the restroom with supervision.  SLP facilitated the session with a kitchen task targeting safety awareness and functional problem solving for home management tasks.  Pt required min assist verbal cue to attend to left upper extremity and find items on the left side of the kitchen cabinet.  Pt was able to verbalize 3 ways how left inattention could impact her safety during functional tasks in the home environment with min assist question cues.  Pt returned to room.  Left upright in recliner, call bell left within reach.  Continue per current plan of care.    FIM:  Comprehension Comprehension Mode: Auditory Comprehension: 6-Follows complex conversation/direction: With extra time/assistive  device Expression Expression Mode: Verbal Expression: 5-Expresses complex 90% of the time/cues < 10% of the time Social Interaction Social Interaction: 6-Interacts appropriately with others with medication or extra time (anti-anxiety, antidepressant). Problem Solving Problem Solving: 4-Solves basic 75 - 89% of the time/requires cueing 10 - 24% of the time Memory Memory: 4-Recognizes or recalls 75 - 89% of the time/requires cueing 10 - 24% of the time  Pain Pain Assessment Pain Assessment: No/denies pain  Therapy/Group: Individual Therapy  Zong Mcquarrie, Selinda Orion 08/18/2014, 12:31 PM

## 2014-08-18 NOTE — Progress Notes (Signed)
Pt has home cpap, water in chamber, and will place on when ready for bed. Encouraged to call if she needs assistance.

## 2014-08-18 NOTE — Progress Notes (Signed)
Physical Therapy Session Note  Patient Details  Name: Carla Hopkins MRN: [id number] Date of Birth: 1950-08-27  Today's Date: 08/18/2014 PT Individual Time: 0906-1000; 1405-1505 PT Individual Time Calculation (min): 54 min , 60 min  Short Term Goals: Week 3:  PT Short Term Goal 1 (Week 3): = LTGs due to LOS  Skilled Therapeutic Interventions/Progress Updates:   tx 1:  neuromuscular re-education via demo, VCS, visual cues for: - seated 10 x 1 alternating marching, trunk ext/flex, alternating long arc quad knee ext with ankle pumps at end range, 10 x 2 bil hip adduction against resistance -5 x 1 sit>< standi without use of UEs, biased toward LLE -L attention during gait, contact guard assistance.  Pt without LOB even during turns to L, but needed cues to avoid obstacles on left 2/4 encounters -unsupported standing x 1.5 min x 2 on wedge, for bil hamstring and heel cord stretch to facilitate balance strategies -Biodex in unsupported standing for limits of stability; mod assist at hips to shift wt at COG rather than at shoulders; for percent weight bearing, pt demonstrated consistent LLE > RLE wt bearing, but was able to correct with visual feedback; consistent forward wt bearing, with L knee slightly flexed, and unable to correct with feedback or manual cues.  Gait in hallway and congested gym x 240',  x 75' with contact guard assist, cues for sequencing NBQC for 2 pt gait.  Pt tends to place L foot forward, then NBQC, then R foot.  Tx 2:  Gait with NBQC from room to gym, min guard assist, mod cues for sequencing. Pt tends to place cane after stepping with LLE. Up/down 5 steps 1 rail with self selected step through pattern ascending, step to pattern descending (leading with L); with repetition on 4: high steps, pt eventually negotiated them x 3 with step through pattern ascending and descending, close supervision. Gait up/down ramp with NBQC, min guard assist, mod cues for  sequence.  neuromuscular re-education via visual feedback, VCS, forced use for: -NuStep at level 4 x 4 minutes with bil LEs and RUE and x 4 minutes with bil LEs only, focusing on L neutral knee alignment, as it tends to drop laterally with decreased L attention -supported side stepping R and L to laterally push BOSU ball to activate hip abductors - R and L hamstring curls in standing with R hand on wall, x 5 L, x 10 R.  Pt needs cues to maintain L knee extension during this movement. - bil calf raises while reaching up with R hand on wall, x 10 each -gait with grocery cart, L hand resting on hand hold bar, x 70' with close supervision, limited by RUE fatigue as pt was steering cart with that hand only  therapeutic activities in standing reaching with R hand overhead to retrieve items, to facilitate L trunk ext and hip abduction, sit>< stand without use of UEs    Therapy Documentation Precautions:  Precautions Precautions: Fall Precaution Comments: severe L sided inattention Restrictions Weight Bearing Restrictions: No   Pain: Pain Assessment Pain Assessment: No/denies pain Mobility:   Locomotion : Ambulation Ambulation/Gait Assistance: 4: Min guard     See FIM for current functional status  Therapy/Group: Individual Therapy  Greogry Goodwyn,[place] 08/18/2014, 10:21 AM

## 2014-08-18 NOTE — Plan of Care (Signed)
Problem: Food- and Nutrition-Related Knowledge Deficit (NB-1.1) Goal: Nutrition education Formal process to instruct or train a patient/client in a skill or to impart knowledge to help patients/clients voluntarily manage or modify food choices and eating behavior to maintain or improve health. Outcome: Completed/Met Date Met:  08/18/14  RD consulted for nutrition education regarding diabetes.   Per pt she only drinks coffee with a sugar sub at home (about 1 pot per day). She consumes no sweetened beverages. She eats three meals per day. Pt plans to go home at the end of the week and will be at home with her neighbors helping her. She does not feel she will be able cook but neighbors plan to bring her food. We reviewed quick healthy options that she can keep on hand.     Lab Results  Component Value Date    HGBA1C 8.5* 07/25/2014    RD provided "Carbohydrate Counting for People with Diabetes" handout from the Academy of [employer] and Dietetics. Discussed different food groups and their effects on blood sugar, emphasizing carbohydrate-containing foods. Provided list of carbohydrates and recommended serving sizes of common foods.  Discussed importance of controlled and consistent carbohydrate intake throughout the day. Provided examples of ways to balance meals/snacks and encouraged intake of high-fiber, whole grain complex carbohydrates. Teach back method used.  Expect fair compliance.  Body mass index is 38.1 kg/(m^2). Pt meets criteria for obesity class II based on current BMI.  Current diet order is CHO modified, patient is consuming approximately 100% of meals at this time. Labs and medications reviewed. No further nutrition interventions warranted at this time. RD contact information provided. If additional nutrition issues arise, please re-consult RD.  [place], [place], [place] Pager [phone] After Hours Pager

## 2014-08-18 NOTE — Consult Note (Signed)
NEUROCOGNITIVE [place]   Carla Hopkins is a 64 year old, right-handed woman, who was seen for a brief neurocognitive status examination to evaluate her emotional state and mental status in the setting of stroke.  According to her medical record, she was admitted on 07/24/14 after a fall noting left-sided weakness.  She did not experience head trauma or loss of consciousness associated with her fall.  MRI of the brain demonstrated multifocal patchy acute right MCA territory infarcts and an additional 66mm cortical infarct within the parasagittal cortical gray matter of the anterior right frontal lobe, likely right ACA distribution.  She did not receive TPA.  She underwent right carotid surgery on 08/05/14 and was then felt to be ready for inpatient rehabilitation.    Emotional Functioning:  During the clinical interview, Carla Hopkins was tearful.  She clarified that she felt as though her mood was "good" until the day prior when she found out that she would not be able to go straight home due to inability to afford care.  She reported feeling "devastated" at that news.  Time was spent validating her frustrations and in processing the thoughts that underlie her response to that news.  Predominantly, reported fears associated with never being able to go back home or get back to doing things that she enjoys.  Of note, she commented multiple times that she sees herself making progress and she feels as though she would be safe to go home on her own, though she also said that she understands that her current treatment team are experts and they are making this recommendation based on safety issues.  In addition to the aforementioned fears, Carla Hopkins also discussed worry about her daughter, financial concerns, and boredom as triggers for frustration.  Finally, she commented that she is feeling some unease related to not knowing what the other options are  if she is not ready to discharge to her home independently.  She denied major symptoms of depression, including suicidal ideation.  Carla Hopkins's responses to self-report measures of mood symptoms were suggestive of the presence of mild depressed mood, but were not indicative of clinically significant anxious mood at this time.     Mental Status:  Carla Hopkins's total score on a very brief measure of mental status was not suggestive of significant cognitive disruption at this time (MMSE-2 brief = 14/16).  She lost points for misstating the date and for freely recalling only 2 of 3 previously studied words after a brief delay.  Subjectively, she denied noticing cognitive changes.    Impressions and Recommendations:  Carla Hopkins's score on a very brief mental status measure was not suggestive of significant cognitive disruption at this time.  However, should she notice persistent cognitive difficulties, a formal neuropsychological evaluation as an outpatient could be conducted.  Contact information on a provider in her area should be included in her discharge paperwork for that purpose.  From an emotional standpoint, Carla Hopkins certainly seems to be experiencing clinically depressed mood, though owing to the timeline of symptoms that she reported, it is likely representative of an adjustment reaction and is probably not an indication of a major underlying psychological disorder.  Continued support throughout her stay will be helpful in managing her temporary low mood.  Provision of details regarding options for care after her inpatient stay may prove to improve her mood.  In addition, she noted that she loves Disney and therefore, if pictures of [employer] (especially  Mickey Mouse and [place]) could be displayed in her room, it may lift her spirits.  Her therapists could also consider playing Starr songs during sessions to elevate her mood and improve participation.  It is expected that Carla Hopkins's low  mood will be temporary, but if her spirits do not lift, a follow-up session with the neuropsychologist could be scheduled next week.  In the interim, Carla Hopkins requested that her daughter be kept in the loop regarding care decisions and her social worker agreed to continue doing so.    DIAGNOSES:   CVA Adjustment disorder with depressed mood  Marlane Hatcher, Psy.D.  Clinical Neuropsychologist

## 2014-08-18 NOTE — Progress Notes (Signed)
Occupational Therapy Session Note  Patient Details  Name: Carla Hopkins MRN: [id number] Date of Birth: [birth year]  Today's Date: 08/18/2014 OT Individual Time: 4656-8127 OT Individual Time Calculation (min): 58 min    Short Term Goals: Week 1:  OT Short Term Goal 1 (Week 1): Pt will bathe with min A. OT Short Term Goal 1 - Progress (Week 1): Met OT Short Term Goal 2 (Week 1): Pt will toilet with min A. OT Short Term Goal 2 - Progress (Week 1): Progressing toward goal OT Short Term Goal 3 (Week 1): Pt will don shirt min A. OT Short Term Goal 3 - Progress (Week 1): Progressing toward goal OT Short Term Goal 4 (Week 1): Pt will don pants with mod A. OT Short Term Goal 4 - Progress (Week 1): Met OT Short Term Goal 5 (Week 1): Pt will be independent with self ROM of LUE. OT Short Term Goal 5 - Progress (Week 1): Progressing toward goal Week 2:  OT Short Term Goal 1 (Week 2): Pt will complete a stand pivot transfer to toilet with steadying A. OT Short Term Goal 1 - Progress (Week 2): Met OT Short Term Goal 2 (Week 2): Pt will don a tshirt with S. OT Short Term Goal 2 - Progress (Week 2): Partly met OT Short Term Goal 3 (Week 2): Pt will be able to don B feet into underwear and pants without A from a seated position. OT Short Term Goal 3 - Progress (Week 2): Met OT Short Term Goal 4 (Week 2): Pt will bathe with S using a long sponge to reach R arm. OT Short Term Goal 4 - Progress (Week 2): Met OT Short Term Goal 5 (Week 2): Pt will stand at sink with steadying A to brush teeth. OT Short Term Goal 5 - Progress (Week 2): Met Week 3:  OT Short Term Goal 1 (Week 3): STG=LTG d/t short remaining LOS  Skilled Therapeutic Interventions/Progress Updates:    Pt seen this session to focus on LUE NMR. Pt declined shower and dressing as she stated she took one yesterday. Pt stood at sink to groom with supervision. Pt transported to gym via w/c and transferred to mat with stand pivot with close S. On  mat, she worked on a variety of a/arom exercises in various planes to facilitate increasing AROM of shoulder flex/ext, elbow flex/ext, forearm pron/supination, wrist ext and active finger flexion. She then engaged in grasping and release cones with A to facilitate grasp along with trunk rotation and reaching. Pt tolerated all exercises well and no longer has any pain , except for minimal discomfort with passive finger flexion. Worked on dynamic standing balance with active L arm reaching down to knees and around back of body to facilitate balance with LB dressing skills. She continues to need cues to fully attend to L side and to visual scan to L as she presents with mod imp L side awareness.  Pt's next therapist arrived for her next session.  Therapy Documentation Precautions:  Precautions Precautions: Fall Precaution Comments: severe L sided inattention Restrictions Weight Bearing Restrictions: No General: General OT Amount of Missed Time: 7 Minutes - nursing care     Pain: Pain Assessment Pain Assessment: No/denies pain ADL:  See FIM for current functional status  Therapy/Group: Individual Therapy  [place] 08/18/2014, 10:49 AM

## 2014-08-18 NOTE — Progress Notes (Signed)
64 y.o. right handed female with history of tobacco abuse, hypertension. Patient lives alone independently prior to admission and working full time. Admitted 07/24/2014 after a fall noting left-sided weakness. No head trauma no loss of consciousness. MRI of the brain shows multifocal patchy acute right MCA territory infarcts underwent right carotid surgery 08/05/2014 per Dr. Oneida Alar  Subjective/Complaints: Some bleeding yesterday from dorsum of left wrist, pt denies trauma to area but has poor sensation and neglect, now has bulky dressing Review of Systems - Negative except Left arm weak   Objective: Vital Signs: Blood pressure 128/67, pulse 69, temperature 98.1 F (36.7 C), temperature source Oral, resp. rate 18, height 5\' 2"  (1.575 m), weight 94.5 kg (208 lb 5.4 oz), SpO2 97 %. No results found. Results for orders placed or performed during the hospital encounter of 08/06/14 (from the past 72 hour(s))  Glucose, capillary     Status: Abnormal   Collection Time: 08/15/14 11:59 AM  Result Value Ref Range   Glucose-Capillary 103 (H) 65 - 99 mg/dL   Comment 1 Notify RN   Glucose, capillary     Status: None   Collection Time: 08/15/14  4:40 PM  Result Value Ref Range   Glucose-Capillary 92 65 - 99 mg/dL   Comment 1 Notify RN   Glucose, capillary     Status: Abnormal   Collection Time: 08/15/14  8:42 PM  Result Value Ref Range   Glucose-Capillary 105 (H) 65 - 99 mg/dL  Glucose, capillary     Status: Abnormal   Collection Time: 08/16/14  6:48 AM  Result Value Ref Range   Glucose-Capillary 115 (H) 65 - 99 mg/dL  Glucose, capillary     Status: None   Collection Time: 08/16/14 11:23 AM  Result Value Ref Range   Glucose-Capillary 91 65 - 99 mg/dL  Glucose, capillary     Status: Abnormal   Collection Time: 08/16/14  4:24 PM  Result Value Ref Range   Glucose-Capillary 159 (H) 65 - 99 mg/dL  Glucose, capillary     Status: Abnormal   Collection Time: 08/16/14  8:45 PM  Result Value Ref Range    Glucose-Capillary 110 (H) 65 - 99 mg/dL  Glucose, capillary     Status: Abnormal   Collection Time: 08/17/14  6:53 AM  Result Value Ref Range   Glucose-Capillary 119 (H) 65 - 99 mg/dL  Glucose, capillary     Status: None   Collection Time: 08/17/14 11:26 AM  Result Value Ref Range   Glucose-Capillary 95 65 - 99 mg/dL  Glucose, capillary     Status: Abnormal   Collection Time: 08/17/14  4:28 PM  Result Value Ref Range   Glucose-Capillary 127 (H) 65 - 99 mg/dL  Glucose, capillary     Status: Abnormal   Collection Time: 08/17/14  8:37 PM  Result Value Ref Range   Glucose-Capillary 115 (H) 65 - 99 mg/dL  Glucose, capillary     Status: Abnormal   Collection Time: 08/18/14  6:29 AM  Result Value Ref Range   Glucose-Capillary 126 (H) 65 - 99 mg/dL      HEENT: Left sided neck incision healing minimal eccymosis Cardio: RRR and no murmur Resp: CTA B/L and unlabored GI: BS positive and NT, ND Extremity:  Pulses positive and No Edema Skin:   Bruise ecchymosis , left dorsum of hand  Neuro: Alert/Oriented, Abnormal Sensory absent LT in LUE, Abnormal Motor 3- Left delt ,3- bi, 2- tri, , 0/5 in Left hand3-HF KE, Inattention and  Other Left field cut Musc/Skel:  Swelling dorsum LEft hand improving, ecchymosis--coban wrap in place Gen NAD   Assessment/Plan: 1. Functional deficits secondary to Right MCA infarct with Left hemiparesis, left neglect and anosognsia which require 3+ hours per day of interdisciplinary therapy in a comprehensive inpatient rehab setting. Physiatrist is providing close team supervision and 24 hour management of active medical problems listed below. Physiatrist and rehab team continue to assess barriers to discharge/monitor patient progress toward functional and medical goals.        FIM: FIM - Bathing Bathing Steps Patient Completed: Chest, Right Arm, Left Arm, Abdomen, Front perineal area, Buttocks, Right upper leg, Left upper leg, Right lower leg (including foot),  Left lower leg (including foot) Bathing: 5: Supervision: Safety issues/verbal cues  FIM - Upper Body Dressing/Undressing Upper body dressing/undressing steps patient completed: Thread/unthread right sleeve of pullover shirt/dresss, Pull shirt over trunk, Thread/unthread left sleeve of pullover shirt/dress, Put head through opening of pull over shirt/dress Upper body dressing/undressing: 4: Min-Patient completed 75 plus % of tasks FIM - Lower Body Dressing/Undressing Lower body dressing/undressing steps patient completed: Thread/unthread right underwear leg, Fasten/unfasten pants, Thread/unthread left underwear leg, Pull underwear up/down, Thread/unthread right pants leg, Don/Doff left sock, Don/Doff right sock, Thread/unthread left pants leg, Pull pants up/down Lower body dressing/undressing: 5: Supervision: Safety issues/verbal cues  FIM - Toileting Toileting steps completed by patient: Adjust clothing prior to toileting, Adjust clothing after toileting, Performs perineal hygiene Toileting Assistive Devices: Grab bar or rail for support Toileting: 5: Supervision: Safety issues/verbal cues  FIM - Radio producer Devices: Grab bars, Oncologist Transfers: 5-To toilet/BSC: Supervision (verbal cues/safety issues), 5-From toilet/BSC: Supervision (verbal cues/safety issues)  FIM - Control and instrumentation engineer Devices: Best boy: 5: Bed > Chair or W/C: Supervision (verbal cues/safety issues), 5: Chair or W/C > Bed: Supervision (verbal cues/safety issues)  FIM - Locomotion: Wheelchair Distance: 60 Locomotion: Wheelchair: 1: Total Assistance/staff pushes wheelchair (Pt<25%) FIM - Locomotion: Ambulation Locomotion: Ambulation Assistive Devices: Nurse, adult Ambulation/Gait Assistance: 4: Min guard, 3: Mod assist Locomotion: Ambulation: 2: Travels 50 - 149 ft with moderate assistance (Pt: 50 - 74%)  Comprehension Comprehension Mode:  Auditory Comprehension: 6-Follows complex conversation/direction: With extra time/assistive device  Expression Expression Mode: Verbal Expression: 5-Expresses complex 90% of the time/cues < 10% of the time  Social Interaction Social Interaction: 6-Interacts appropriately with others with medication or extra time (anti-anxiety, antidepressant).  Problem Solving Problem Solving: 4-Solves basic 75 - 89% of the time/requires cueing 10 - 24% of the time  Memory Memory: 4-Recognizes or recalls 75 - 89% of the time/requires cueing 10 - 24% of the time  Medical Problem List and Plan: 1. Functional deficits secondary to right MCA infarct 2.  DVT Prophylaxis/Anticoagulation: Subcutaneous Lovenox for DVT prophylaxis. Monitor for any bleeding episodes 3. Pain Management: Percocet as needed. Monitor with increased mobility 4. Right carotid stenosis. Status post carotid endarterectomy 08/05/2014 5. Neuropsych: This patient is capable of making decisions on her own behalf. 6. Skin/Wound Care: Routine skin checks 7. Fluids/Electrolytes/Nutrition:   I and O's with follow-up chemistries 8. Dysphagia. Dysphagia #2 sent liquids follow-up speech therapy, adequate po fluid intake 9. Hypertension. Lisinopril 5 mg daily. M  10. Diabetes mellitus. Hemoglobin A1c 8.5. Glucophage 500 mg twice a day.   -blood sugars well controlled, Re c Glucometer to check qd CBG at home 11. Hypothyroidism. Synthroid 12. Hyperlipidemia. Lipitor 13. IV infiltration left hand. Conservative care/wrapping,elevation, ecchymosis reducing,  14. 6 mm pulmonary nodule identified on CT and GL chest. Recommendations follow-up CT of the chest in 6-12 months  LOS (Days) 12 A FACE TO FACE EVALUATION WAS PERFORMED  Jaysiah Marchetta E 08/18/2014, 7:02 AM

## 2014-08-18 NOTE — Progress Notes (Signed)
Social Work Patient ID: Emmary Culbreath, female   DOB: Jun 18, 1950, 64 y.o.   MRN: [id number] Contacted a couple home care agencies[facility] pt's preference to have come and discuss with her their services and costs. Have also contacted daughter to give her their names and numbers and she will contact them.  Her concern is pt will go through her assets quickly and is Concerned about this and feels she needs the lowest cost agency in [place] but wants them to be reputable.  Pt and daughter seem to have differing Opinions about care.  Daughter feels she should go to a NH for a month private pay-this is an options also.

## 2014-08-19 ENCOUNTER — Inpatient Hospital Stay (HOSPITAL_COMMUNITY): Payer: BLUE CROSS/BLUE SHIELD | Admitting: Speech Pathology

## 2014-08-19 ENCOUNTER — Inpatient Hospital Stay (HOSPITAL_COMMUNITY): Payer: BLUE CROSS/BLUE SHIELD | Admitting: Occupational Therapy

## 2014-08-19 ENCOUNTER — Inpatient Hospital Stay (HOSPITAL_COMMUNITY): Payer: BLUE CROSS/BLUE SHIELD

## 2014-08-19 LAB — GLUCOSE, CAPILLARY
Glucose-Capillary: 139 mg/dL — ABNORMAL HIGH (ref 65–99)
Glucose-Capillary: 80 mg/dL (ref 65–99)
Glucose-Capillary: 99 mg/dL (ref 65–99)

## 2014-08-19 NOTE — Progress Notes (Signed)
64 y.o. right handed female with history of tobacco abuse, hypertension. Patient lives alone independently prior to admission and working full time. Admitted 07/24/2014 after a fall noting left-sided weakness. No head trauma no loss of consciousness. MRI of the brain shows multifocal patchy acute right MCA territory infarcts underwent right carotid surgery 08/05/2014 per Dr. Oneida Alar  Subjective/Complaints: Pt , daughter and SW working on D/C planning Review of Systems - Negative except Left arm weak   Objective: Vital Signs: Blood pressure 114/62, pulse 70, temperature 98.2 F (36.8 C), temperature source Oral, resp. rate 18, height 5\' 2"  (1.575 m), weight 94.5 kg (208 lb 5.4 oz), SpO2 99 %. No results found. Results for orders placed or performed during the hospital encounter of 08/06/14 (from the past 72 hour(s))  Glucose, capillary     Status: None   Collection Time: 08/16/14 11:23 AM  Result Value Ref Range   Glucose-Capillary 91 65 - 99 mg/dL  Glucose, capillary     Status: Abnormal   Collection Time: 08/16/14  4:24 PM  Result Value Ref Range   Glucose-Capillary 159 (H) 65 - 99 mg/dL  Glucose, capillary     Status: Abnormal   Collection Time: 08/16/14  8:45 PM  Result Value Ref Range   Glucose-Capillary 110 (H) 65 - 99 mg/dL  Glucose, capillary     Status: Abnormal   Collection Time: 08/17/14  6:53 AM  Result Value Ref Range   Glucose-Capillary 119 (H) 65 - 99 mg/dL  Glucose, capillary     Status: None   Collection Time: 08/17/14 11:26 AM  Result Value Ref Range   Glucose-Capillary 95 65 - 99 mg/dL  Glucose, capillary     Status: Abnormal   Collection Time: 08/17/14  4:28 PM  Result Value Ref Range   Glucose-Capillary 127 (H) 65 - 99 mg/dL  Glucose, capillary     Status: Abnormal   Collection Time: 08/17/14  8:37 PM  Result Value Ref Range   Glucose-Capillary 115 (H) 65 - 99 mg/dL  Glucose, capillary     Status: Abnormal   Collection Time: 08/18/14  6:29 AM  Result Value  Ref Range   Glucose-Capillary 126 (H) 65 - 99 mg/dL  Glucose, capillary     Status: None   Collection Time: 08/18/14 11:22 AM  Result Value Ref Range   Glucose-Capillary 99 65 - 99 mg/dL   Comment 1 Notify RN   Glucose, capillary     Status: None   Collection Time: 08/18/14  4:39 PM  Result Value Ref Range   Glucose-Capillary 93 65 - 99 mg/dL   Comment 1 Notify RN   Glucose, capillary     Status: Abnormal   Collection Time: 08/18/14  8:57 PM  Result Value Ref Range   Glucose-Capillary 116 (H) 65 - 99 mg/dL      HEENT: Left sided neck incision healing minimal eccymosis Cardio: RRR and no murmur Resp: CTA B/L and unlabored GI: BS positive and NT, ND Extremity:  Pulses positive and No Edema Skin:   Bruise ecchymosis , left dorsum of hand  Neuro: Alert/Oriented, Abnormal Sensory absent LT in LUE, Abnormal Motor 3- Left delt ,3- bi, 2- tri, , 0/5 in Left hand3-HF KE, Inattention and Other Left field cut Musc/Skel:  Swelling dorsum LEft hand improving, ecchymosis--coban wrap in place Gen NAD   Assessment/Plan: 1. Functional deficits secondary to Right MCA infarct with Left hemiparesis, left neglect and anosognsia which require 3+ hours per day of interdisciplinary therapy in a  comprehensive inpatient rehab setting. Physiatrist is providing close team supervision and 24 hour management of active medical problems listed below. Physiatrist and rehab team continue to assess barriers to discharge/monitor patient progress toward functional and medical goals. Will rec 24/7 sup in either home or SNF setting, high risk for falls/injury if not supervised       FIM: FIM - Bathing Bathing Steps Patient Completed: Chest, Right Arm, Left Arm, Abdomen, Front perineal area, Buttocks, Right upper leg, Left upper leg, Right lower leg (including foot), Left lower leg (including foot) Bathing: 5: Supervision: Safety issues/verbal cues  FIM - Upper Body Dressing/Undressing Upper body dressing/undressing  steps patient completed: Thread/unthread right sleeve of pullover shirt/dresss, Pull shirt over trunk, Thread/unthread left sleeve of pullover shirt/dress, Put head through opening of pull over shirt/dress Upper body dressing/undressing: 4: Min-Patient completed 75 plus % of tasks FIM - Lower Body Dressing/Undressing Lower body dressing/undressing steps patient completed: Thread/unthread right underwear leg, Fasten/unfasten pants, Thread/unthread left underwear leg, Pull underwear up/down, Thread/unthread right pants leg, Don/Doff left sock, Don/Doff right sock, Thread/unthread left pants leg, Pull pants up/down Lower body dressing/undressing: 5: Supervision: Safety issues/verbal cues  FIM - Toileting Toileting steps completed by patient: Adjust clothing prior to toileting, Adjust clothing after toileting, Performs perineal hygiene Toileting Assistive Devices: Grab bar or rail for support Toileting: 5: Supervision: Safety issues/verbal cues  FIM - Radio producer Devices: Grab bars, Oncologist Transfers: 5-To toilet/BSC: Supervision (verbal cues/safety issues), 5-From toilet/BSC: Supervision (verbal cues/safety issues)  FIM - Control and instrumentation engineer Devices: Best boy: 5: Bed > Chair or W/C: Supervision (verbal cues/safety issues), 5: Chair or W/C > Bed: Supervision (verbal cues/safety issues)  FIM - Locomotion: Wheelchair Distance: 60 Locomotion: Wheelchair: 1: Total Assistance/staff pushes wheelchair (Pt<25%) FIM - Locomotion: Ambulation Locomotion: Ambulation Assistive Devices: Nurse, adult Ambulation/Gait Assistance: 4: Min guard Locomotion: Ambulation: 4: Travels 150 ft or more with minimal assistance (Pt.>75%)  Comprehension Comprehension Mode: Auditory Comprehension: 6-Follows complex conversation/direction: With extra time/assistive device  Expression Expression Mode: Verbal Expression: 5-Expresses complex 90%  of the time/cues < 10% of the time  Social Interaction Social Interaction: 6-Interacts appropriately with others with medication or extra time (anti-anxiety, antidepressant).  Problem Solving Problem Solving: 4-Solves basic 75 - 89% of the time/requires cueing 10 - 24% of the time  Memory Memory: 4-Recognizes or recalls 75 - 89% of the time/requires cueing 10 - 24% of the time  Medical Problem List and Plan: 1. Functional deficits secondary to right MCA infarct 2.  DVT Prophylaxis/Anticoagulation: Subcutaneous Lovenox for DVT prophylaxis. Monitor for any bleeding episodes 3. Pain Management: Percocet as needed. Monitor with increased mobility 4. Right carotid stenosis. Status post carotid endarterectomy 08/05/2014 5. Neuropsych: This patient is capable of making decisions on her own behalf. 6. Skin/Wound Care: Routine skin checks 7. Fluids/Electrolytes/Nutrition:   I and O's with follow-up chemistries 8. Dysphagia. Dysphagia #2 sent liquids follow-up speech therapy, adequate po fluid intake 9. Hypertension. Lisinopril 5 mg daily. M  10. Diabetes mellitus. Hemoglobin A1c 8.5. Glucophage 500 mg twice a day.   -blood sugars well controlled, Re c Glucometer to check qd CBG at home 11. Hypothyroidism. Synthroid 12. Hyperlipidemia. Lipitor 13. IV infiltration left hand. Conservative care/wrapping,elevation, ecchymosis reducing,  14. 6 mm pulmonary nodule identified on CT and GL chest. Recommendations follow-up CT of the chest in 6-12 months  LOS (Days) 13 A FACE TO FACE EVALUATION WAS PERFORMED  KIRSTEINS,ANDREW E 08/19/2014, 6:49 AM

## 2014-08-19 NOTE — Progress Notes (Signed)
Physical Therapy Session Note  Patient Details  Name: Carla Hopkins MRN: [id number] Date of Birth: 09/18/1950  Today's Date: 08/19/2014 PT Individual Time: 1300-1358 PT Individual Time Calculation (min): 58 min   Short Term Goals: STG=LTG  Skilled Therapeutic Interventions/Progress Updates:    Patient seen for individual session for focus on balance/balance testing, gait and left LE strength/proprioception.  Gait x 150' to gym with [facility] and minguard to supervision occasionally dragging left foot.  Gait activities stepping through floor ladder for step length with cane, side stepping over small cones with min/mod assist, side stepping and crossing over.  Patient continues to need cueing for left side awareness around obstacles and occasionally for left foot placement.  Gait x 120' with cane head turns left to ID color and number of round discs for increased left side awareness.  Therapy Documentation Precautions:  Precautions Precautions: Fall Precaution Comments: severe L sided inattention Restrictions Weight Bearing Restrictions: No General:   Vital Signs: Therapy Vitals Temp: 97.9 F (36.6 C) Temp Source: Oral Pulse Rate: 74 Resp: 18 BP: (!) 124/58 mmHg Patient Position (if appropriate): Sitting Oxygen Therapy SpO2: 98 % O2 Device: Not Delivered Pain: Pain Assessment Pain Assessment: No/denies pain Mobility:   Locomotion : Ambulation Ambulation/Gait Assistance: 4: Min guard  Trunk/Postural Assessment :    Balance: Balance Balance Assessed: Yes Standardized Balance Assessment Standardized Balance Assessment: Berg Balance Test Berg Balance Test Sit to Stand: Able to stand without using hands and stabilize independently Standing Unsupported: Able to stand safely 2 minutes Sitting with Back Unsupported but Feet Supported on Floor or Stool: Able to sit safely and securely 2 minutes Stand to Sit: Sits safely with minimal use of hands Transfers: Able to transfer  safely, minor use of hands Standing Unsupported with Eyes Closed: Able to stand 10 seconds safely Standing Ubsupported with Feet Together: Able to place feet together independently and stand for 1 minute with supervision From Standing, Reach Forward with Outstretched Arm: Can reach confidently >25 cm (10") From Standing Position, Pick up Object from Floor: Able to pick up shoe safely and easily From Standing Position, Turn to Look Behind Over each Shoulder: Looks behind from both sides and weight shifts well Turn 360 Degrees: Able to turn 360 degrees safely but slowly Standing Unsupported, Alternately Place Feet on Step/Stool: Able to complete >2 steps/needs minimal assist Standing Unsupported, One Foot in Front: Able to plae foot ahead of the other independently and hold 30 seconds Standing on One Leg: Tries to lift leg/unable to hold 3 seconds but remains standing independently Total Score: 46 Exercises:  Patient performed right sidelying left hip abduction x 10 assist to maintain right sidelying; clamshell with orange theraband x 10; long arc quads left with 4# 5 sec hold x 10, standing hip extension x 10 each leg with UE support, standing side step ups x 5 to left with UE support, lateral tap ups to step with right in single limb stance on left.  Nu step level 3 LE only x 3 minutes. Other Treatments:    See FIM for current functional status  Therapy/Group: Individual Therapy  [place], [place] 08/19/2014  08/19/2014, 3:48 PM

## 2014-08-19 NOTE — Progress Notes (Signed)
Occupational Therapy Session Note  Patient Details  Name: Carla Hopkins MRN: [id number] Date of Birth: 1950-06-22  Today's Date: 08/19/2014 OT Individual Time: 1400-1450 OT Individual Time Calculation (min): 50 min    Short Term Goals: Week 1:  OT Short Term Goal 1 (Week 1): Pt will bathe with min A. OT Short Term Goal 1 - Progress (Week 1): Met OT Short Term Goal 2 (Week 1): Pt will toilet with min A. OT Short Term Goal 2 - Progress (Week 1): Progressing toward goal OT Short Term Goal 3 (Week 1): Pt will don shirt min A. OT Short Term Goal 3 - Progress (Week 1): Progressing toward goal OT Short Term Goal 4 (Week 1): Pt will don pants with mod A. OT Short Term Goal 4 - Progress (Week 1): Met OT Short Term Goal 5 (Week 1): Pt will be independent with self ROM of LUE. OT Short Term Goal 5 - Progress (Week 1): Progressing toward goal  Skilled Therapeutic Interventions/Progress Updates:    1:1 NMR with focus on normal patterns of movement of her left UE in a modified reclined position on a wedge and then in sitting.  Performed PNF (D1 D2) with min A for support. Pt with decreased grasp and release needing max A to obtain (due to decr mobility and swelling). Pt also noted with slight tightness in her pec with shoulder abduction and flexion.Applied kinesotape on each finger for swelling. Bandage changed on hand from hematoma. Pt demonstrated safe functional ambulation with quad cane from gym to room with close supervision.   Therapy Documentation Precautions:  Precautions Precautions: Fall Precaution Comments: severe L sided inattention Restrictions Weight Bearing Restrictions: No Pain: Pain Assessment Pain Assessment: No/denies pain  See FIM for current functional status  Therapy/Group: Individual Therapy  Willeen Cass [facility] 08/19/2014, 7:01 PM

## 2014-08-19 NOTE — Progress Notes (Signed)
Speech Language Pathology Daily Session Note  Patient Details  Name: Carla Hopkins MRN: [id number] Date of Birth: 08/23/1950  Today's Date: 08/19/2014 SLP Individual Time: 1100-1130 SLP Individual Time Calculation (min): 30 min  Short Term Goals: Week 1: SLP Short Term Goal 1 (Week 1): Pt will attend to left body/environment during semi-complex functional tasks with min assist verbal cues SLP Short Term Goal 1 - Progress (Week 1): Met SLP Short Term Goal 2 (Week 1): Pt will complete semi-complex tasks with min assist verbal cues for functional problem solving.   SLP Short Term Goal 2 - Progress (Week 1): Progressing toward goal SLP Short Term Goal 3 (Week 1): Pt will return demonstration of oral motor strengthening exercises targeting left sided weakness with supervision.  SLP Short Term Goal 3 - Progress (Week 1): Discontinued (comment) SLP Short Term Goal 4 (Week 1): Pt will utilize slow rate and overarticulation to achieve intelligibility in conversations with supervision.  SLP Short Term Goal 4 - Progress (Week 1): Met SLP Short Term Goal 5 (Week 1): Pt will consume regular textures and thin liquids with mod I self monitoring and correction of left sided buccal residue.   SLP Short Term Goal 5 - Progress (Week 1): Met  Skilled Therapeutic Interventions: Pt was seen for skilled treatment session focusing on cognitive goals. Upon arrival, patient was sitting upright in wheelchair and was agreeable to participate in treatment session. SLP facilitated session by providing MinA verbal cues for problem solving during a mildly complex task regarding planning a grocery shopping trip with establishment of budget. Pt reported she does not receive a newspaper, and has never used fliers for shopping purposes.  She verbalized awareness of when money is deposited, and remaining balance in her accounts via online banking. Pt reported her daughter has established auto bill pay for pt. Per pt, she is being  DC'd home on Friday of this week, and will have her daughter with her through the weekend. After that, she will have assistance 12 hours per day, and has neighbors who are willing to help. She reports no awareness of cognitive deficits, and feels she will be safe at home alone when aide is not there.  Patient left upright in wheelchair with all needs within reach. Continue with current plan of care.     FIM:  Comprehension Comprehension Mode: Auditory Comprehension: 6-Follows complex conversation/direction: With extra time/assistive device Expression Expression Mode: Verbal Expression: 5-Expresses complex 90% of the time/cues < 10% of the time Social Interaction Social Interaction: 6-Interacts appropriately with others with medication or extra time (anti-anxiety, antidepressant). Problem Solving Problem Solving: 4-Solves basic 75 - 89% of the time/requires cueing 10 - 24% of the time Memory Memory: 4-Recognizes or recalls 75 - 89% of the time/requires cueing 10 - 24% of the time  Pain Pain Assessment Pain Assessment: No/denies pain  Therapy/Group: Individual Therapy   Celia B. Bueche, MSP, CCC-SLP [phone]  Bueche, Celia Brown 08/19/2014, 12:52 PM   

## 2014-08-19 NOTE — Progress Notes (Signed)
Occupational Therapy Session Note  Patient Details  Name: Carla Hopkins MRN: [id number] Date of Birth: January 24, 1951  Today's Date: 08/19/2014 OT Individual Time: 0900-0920 (individual) and 12-1028 (concurrent) OT Individual Time Calculation (min): 20 min and 30 min   Short Term Goals: Week 1:  OT Short Term Goal 1 (Week 1): Pt will bathe with min A. OT Short Term Goal 1 - Progress (Week 1): Met OT Short Term Goal 2 (Week 1): Pt will toilet with min A. OT Short Term Goal 2 - Progress (Week 1): Progressing toward goal OT Short Term Goal 3 (Week 1): Pt will don shirt min A. OT Short Term Goal 3 - Progress (Week 1): Progressing toward goal OT Short Term Goal 4 (Week 1): Pt will don pants with mod A. OT Short Term Goal 4 - Progress (Week 1): Met OT Short Term Goal 5 (Week 1): Pt will be independent with self ROM of LUE. OT Short Term Goal 5 - Progress (Week 1): Progressing toward goal Week 2:  OT Short Term Goal 1 (Week 2): Pt will complete a stand pivot transfer to toilet with steadying A. OT Short Term Goal 1 - Progress (Week 2): Met OT Short Term Goal 2 (Week 2): Pt will don a tshirt with S. OT Short Term Goal 2 - Progress (Week 2): Partly met OT Short Term Goal 3 (Week 2): Pt will be able to don B feet into underwear and pants without A from a seated position. OT Short Term Goal 3 - Progress (Week 2): Met OT Short Term Goal 4 (Week 2): Pt will bathe with S using a long sponge to reach R arm. OT Short Term Goal 4 - Progress (Week 2): Met OT Short Term Goal 5 (Week 2): Pt will stand at sink with steadying A to brush teeth. OT Short Term Goal 5 - Progress (Week 2): Met Week 3:  OT Short Term Goal 1 (Week 3): STG=LTG d/t short remaining LOS  Skilled Therapeutic Interventions/Progress Updates:    Visit 1: No c/o pain. Pt seen this session for LUE NMR to facilitate shoulder AROM. Pt scheduled for B/D but pt declined, stating she did not think there would be enough time as she was expecting  a Home health interview at 930.  She stated that the NT handed her clothing to her this am and she donned all of her clothing independently. Informed pt that we need to do a shower and dress tomorrow am for reassessment. Pt transferred to edge of bed from recliner and worked with UE Ranger to facilitate supported AROM of shoulder and elbow. At 920, [facility] rep arrived and offered to wait for therapy session to end. Pt quite anxious to begin interview. Informed pt we would continue tx at 10am.  Pt left sitting EOB to interview home health aid.  Visit 2:  No c/o pain. Pt completed interview. Pt ambulated with quad cane from room to therapy gym to sit at [facility]. Pt seen this session for continued R NMR to facilitate finger grasp with 15 min of estim to finger flexors at intensity 30.  Pt actively flexed fingers along with stimulation. After estim completed, pt worked on grasping and releasing cones and plastic cups with stacking them with hand over hand assist. No active thumb movement yet which prohibits grasp. Pt transferred to w.c and taken back to her room with all needs met.  Therapy Documentation Precautions:  Precautions Precautions: Fall Precaution Comments: severe L sided inattention Restrictions Weight Bearing Restrictions:  No      Pain: Pain Assessment Pain Assessment: No/denies pain ADL:  See FIM for current functional status  Therapy/Group: Individual Therapy and and concurrent 2nd visit  [place] 08/19/2014, 12:35 PM

## 2014-08-20 ENCOUNTER — Encounter: Payer: Self-pay | Admitting: Vascular Surgery

## 2014-08-20 ENCOUNTER — Inpatient Hospital Stay (HOSPITAL_COMMUNITY): Payer: BLUE CROSS/BLUE SHIELD | Admitting: Speech Pathology

## 2014-08-20 ENCOUNTER — Inpatient Hospital Stay (HOSPITAL_COMMUNITY): Payer: BLUE CROSS/BLUE SHIELD

## 2014-08-20 ENCOUNTER — Inpatient Hospital Stay (HOSPITAL_COMMUNITY): Payer: BLUE CROSS/BLUE SHIELD | Admitting: Occupational Therapy

## 2014-08-20 DIAGNOSIS — L089 Local infection of the skin and subcutaneous tissue, unspecified: Secondary | ICD-10-CM

## 2014-08-20 DIAGNOSIS — S60222S Contusion of left hand, sequela: Secondary | ICD-10-CM

## 2014-08-20 LAB — GLUCOSE, CAPILLARY
GLUCOSE-CAPILLARY: 126 mg/dL — AB (ref 65–99)
Glucose-Capillary: 121 mg/dL — ABNORMAL HIGH (ref 65–99)
Glucose-Capillary: 78 mg/dL (ref 65–99)
Glucose-Capillary: 105 mg/dL — ABNORMAL HIGH (ref 65–99)

## 2014-08-20 NOTE — Progress Notes (Signed)
Pt educated on diabetic diet and metformin medication BID with meals. Handouts given as well as nutrition education given. Pt states, "I am confident in managing my diabetes and blood sugars."

## 2014-08-20 NOTE — Progress Notes (Signed)
Occupational Therapy Session Note  Patient Details  Name: Carla Hopkins MRN: [id number] Date of Birth: 02-Apr-1950  Today's Date: 08/20/2014 OT Individual Time: 3419-6222 OT Individual Time Calculation (min): 45 min    Short Term Goals: Week 1:  OT Short Term Goal 1 (Week 1): Pt will bathe with min A. OT Short Term Goal 1 - Progress (Week 1): Met OT Short Term Goal 2 (Week 1): Pt will toilet with min A. OT Short Term Goal 2 - Progress (Week 1): Progressing toward goal OT Short Term Goal 3 (Week 1): Pt will don shirt min A. OT Short Term Goal 3 - Progress (Week 1): Progressing toward goal OT Short Term Goal 4 (Week 1): Pt will don pants with mod A. OT Short Term Goal 4 - Progress (Week 1): Met OT Short Term Goal 5 (Week 1): Pt will be independent with self ROM of LUE. OT Short Term Goal 5 - Progress (Week 1): Progressing toward goal Week 2:  OT Short Term Goal 1 (Week 2): Pt will complete a stand pivot transfer to toilet with steadying A. OT Short Term Goal 1 - Progress (Week 2): Met OT Short Term Goal 2 (Week 2): Pt will don a tshirt with S. OT Short Term Goal 2 - Progress (Week 2): Partly met OT Short Term Goal 3 (Week 2): Pt will be able to don B feet into underwear and pants without A from a seated position. OT Short Term Goal 3 - Progress (Week 2): Met OT Short Term Goal 4 (Week 2): Pt will bathe with S using a long sponge to reach R arm. OT Short Term Goal 4 - Progress (Week 2): Met OT Short Term Goal 5 (Week 2): Pt will stand at sink with steadying A to brush teeth. OT Short Term Goal 5 - Progress (Week 2): Met Week 3:  OT Short Term Goal 1 (Week 3): STG=LTG d/t short remaining LOS  Skilled Therapeutic Interventions/Progress Updates:    Pt seen for BADL retraining in ADL apt to focus on tub bench transfers and safety with showering on tub bench. Pt ambulated into bathroom from apt with cane and transferred to bench with S and cues for safe LLE foot placement when scooting  across on the bench.  Pt showered using long sponge with Supervision and A to manage [name] shower. She dried off in shower with cues to dry feet thoroughly. Transferred to chair to dress. She continued to have a great deal of difficulty donning her sports bra and pulling shirt over L shoulder. She continues to need A to fully pull underwear and pants up over L hip.  Yesterday, pt informed me she did this herself so she continues to have decreased insight. She will have a home health aide to assist her with these tasks at home.  Pt returned to the room with all needs met. She needs further practice with tub bench transfers.  Therapy Documentation Precautions:  Precautions Precautions: Fall Precaution Comments: severe L sided inattention Restrictions Weight Bearing Restrictions: No   Pain: Pain Assessment Pain Assessment: No/denies pain   ADL:  See FIM for current functional status  Therapy/Group: Individual Therapy  [place] 08/20/2014, 11:11 AM

## 2014-08-20 NOTE — Progress Notes (Signed)
Speech Language Pathology Daily Session Note  Patient Details  Name: Carla Hopkins MRN: [id number] Date of Birth: 12-Jun-1950  Today's Date: 08/20/2014 SLP Individual Time: 1500-1530 SLP Individual Time Calculation (min): 30 min  Short Term Goals: Week 2: SLP Short Term Goal 1 (Week 2): Pt will attend to left body/environment during semi-complex functional tasks with min assist visual cues SLP Short Term Goal 1 - Progress (Week 2): Progressing toward goal SLP Short Term Goal 2 (Week 2): Pt will complete semi-complex tasks with min assist verbal cues for functional problem solving.   SLP Short Term Goal 2 - Progress (Week 2): Progressing toward goal SLP Short Term Goal 3 (Week 2): Pt will utilize slow rate and overarticulation to achieve intelligibility in conversations with mod I.  SLP Short Term Goal 3 - Progress (Week 2): Progressing toward goal SLP Short Term Goal 4 (Week 2): Pt will identify and correct errors in the moment with min assist verbal and visual cues.   SLP Short Term Goal 4 - Progress (Week 2): Progressing toward goal SLP Short Term Goal 5 (Week 2): Pt will identify at least 2 physical and/or cognitive changes occurring s/p stroke with mod question cues.   SLP Short Term Goal 5 - Progress (Week 2): Progressing toward goal  Skilled Therapeutic Interventions:  Pt was seen for skilled ST targeting cognitive goals.  Upon arrival, pt was seated upright in wheelchair, awake, alert, and agreeable to participate in [place].  SLP facilitated the session with a semi-complex deductive reasoning task targeting visual scanning to the left and functional problem solving.  Pt required overall supervision cues to visually scan to the left of a Olman Yono of text.  Pt planned and executed a problem solving strategy during the abovementioned task with overall min assist verbal cues for mental flexibility and awareness of errors.  Pt was returned to the room at the end of today's session, left upright in  wheelchair, with all needs left within reach.  Continue per current plan of care.    FIM:  Comprehension Comprehension Mode: Auditory Comprehension: 5-Understands complex 90% of the time/Cues < 10% of the time Expression Expression Mode: Verbal Expression: 5-Expresses complex 90% of the time/cues < 10% of the time Social Interaction Social Interaction: 6-Interacts appropriately with others with medication or extra time (anti-anxiety, antidepressant). Problem Solving Problem Solving: 4-Solves basic 75 - 89% of the time/requires cueing 10 - 24% of the time Memory Memory: 5-Recognizes or recalls 90% of the time/requires cueing < 10% of the time  Pain Pain Assessment Pain Assessment: No/denies pain  Therapy/Group: Individual Therapy  Carla Hopkins, Selinda Orion 08/20/2014, 4:38 PM

## 2014-08-20 NOTE — Patient Care Conference (Signed)
Inpatient RehabilitationTeam Conference and Plan of Care Update Date: 08/20/2014   Time: 10;40 AM    Patient Name: Carla Hopkins      Medical Record Number: [id number]  Date of Birth: 12/04/1950 Sex: Female         Room/Bed: 4M01C/4M01C-01 Payor Info: Payor: [place] / Plan: BCBS OTHER / Product Type: *No Product type* /    Admitting Diagnosis: R MCA CVA   Admit Date/Time:  08/06/2014  3:14 PM Admission Comments: No comment available   Primary Diagnosis:  <principal problem not specified> Principal Problem: <principal problem not specified>  Patient Active Problem List   Diagnosis Date Noted  . History of right MCA stroke 08/06/2014  . Carotid stenosis 08/05/2014  . Acute right MCA stroke 07/30/2014  . Cerebral infarction due to thrombosis of right middle cerebral artery   . Thyroid activity decreased   . Left-sided weakness   . Stroke   . Essential hypertension   . Carotid artery stenosis   . CVA (cerebral infarction) 07/24/2014  . Hyperlipidemia 07/24/2014  . Diabetes 07/24/2014  . Elevated troponin 07/24/2014  . Tobacco abuse 07/24/2014  . Benign essential HTN 07/23/2014  . Obstructive sleep apnea 07/23/2014  . Hypothyroidism 07/03/2014  . Type II or unspecified type diabetes mellitus without mention of complication, not stated as uncontrolled 09/25/2013  . BMI 38.0-38.9,adult 05/19/2013  . Nicotine addiction 05/19/2013  . Right atrial enlargement 05/19/2013    Expected Discharge Date: Expected Discharge Date: 08/22/14  Team Members Present: Physician leading conference: Dr. Alysia Penna Social Worker Present: Ovidio Kin, LCSW Nurse Present: Heather Roberts, RN PT Present: Raylene Everts, PT;Rodney Annie Main, PT OT Present: Simonne Come, Dorothyann Gibbs, OT SLP Present: Windell Moulding, SLP PPS Coordinator present : Daiva Nakayama, RN, CRRN     Current Status/Progress Goal Weekly Team Focus  Medical   Hematoma left dorsum hand and wrist   Maintain skin integrity reduce edema prior to discharge  Discontinue anticoagulation except for Plavix, elevated hand   Bowel/Bladder   cont  BM with min assist  monitor need for laxative/sorbitol given 5/23   Swallow/Nutrition/ Hydration     na        ADL's    min A/ Supervision level   supervision with BADLs  RUE NMR, estim, dynamic balance, adaptive dressing techniques, pt education   Mobility   supervision transfers, close supervision> min assist gait 150', supervision steps x 4 w/rail; Merrilee Jansky 46/56.  supervision overall  L attention/awareness, left hip/knee proprioception, gait, balance, endurance   Communication     na        Safety/Cognition/ Behavioral Observations  min assist-supervision for semi-complex tasks   supervision, downgraded   left attention, semi-complex functional problem solving, completion of education prior to discharge.     Pain   no pain med required  <3  assess pain /need for prn   Skin   Hematoma lft hand  no new skin breakdown and resolving hematoma  Assess healing of hematoma and monitor skin      *See Care Plan and progress notes for long and short-term goals.  Barriers to Discharge: Daughter lives out of town, no local family    Possible Resolutions to Barriers:  Patient  plans to hire caregivers    Discharge Planning/Teaching Needs:  Pt is hiring assistance for home-insurance denied coverage for NH care-considered custodial care.   care for lft hand wound at d/c   Team Discussion:  Berg 46/56-still at high risk  to fall due to inattention and neglect-pt has excuses for her deficits.  Tries to cover awareness issues.  Hand better-wrapped-will need dressing changes at home.DC lovenox today. Has hired care for home-daughter coming in tomorrow for transition home.  Revisions to Treatment Plan:  None   Continued Need for Acute Rehabilitation Level of Care: The patient requires daily medical management by a physician with specialized training in  physical medicine and rehabilitation for the following conditions: Daily direction of a multidisciplinary physical rehabilitation program to ensure safe treatment while eliciting the highest outcome that is of practical value to the patient.: Yes Daily medical management of patient stability for increased activity during participation in an intensive rehabilitation regime.: Yes Daily analysis of laboratory values and/or radiology reports with any subsequent need for medication adjustment of medical intervention for : Neurological problems;Other  Elease Hashimoto 08/20/2014, 12:56 PM

## 2014-08-20 NOTE — Progress Notes (Signed)
64 y.o. right handed female with history of tobacco abuse, hypertension. Patient lives alone independently prior to admission and working full time. Admitted 07/24/2014 after a fall noting left-sided weakness. No head trauma no loss of consciousness. MRI of the brain shows multifocal patchy acute right MCA territory infarcts underwent right carotid surgery 08/05/2014 per Dr. Oneida Alar  Subjective/Complaints: No issues overnite   No hand pain  Review of Systems - Negative except Left arm weak   Objective: Vital Signs: Blood pressure 117/64, pulse 73, temperature 97.6 F (36.4 C), temperature source Oral, resp. rate 18, height 5\' 2"  (1.575 m), weight 94.5 kg (208 lb 5.4 oz), SpO2 99 %. No results found. Results for orders placed or performed during the hospital encounter of 08/06/14 (from the past 72 hour(s))  Glucose, capillary     Status: None   Collection Time: 08/17/14 11:26 AM  Result Value Ref Range   Glucose-Capillary 95 65 - 99 mg/dL  Glucose, capillary     Status: Abnormal   Collection Time: 08/17/14  4:28 PM  Result Value Ref Range   Glucose-Capillary 127 (H) 65 - 99 mg/dL  Glucose, capillary     Status: Abnormal   Collection Time: 08/17/14  8:37 PM  Result Value Ref Range   Glucose-Capillary 115 (H) 65 - 99 mg/dL  Glucose, capillary     Status: Abnormal   Collection Time: 08/18/14  6:29 AM  Result Value Ref Range   Glucose-Capillary 126 (H) 65 - 99 mg/dL  Glucose, capillary     Status: None   Collection Time: 08/18/14 11:22 AM  Result Value Ref Range   Glucose-Capillary 99 65 - 99 mg/dL   Comment 1 Notify RN   Glucose, capillary     Status: None   Collection Time: 08/18/14  4:39 PM  Result Value Ref Range   Glucose-Capillary 93 65 - 99 mg/dL   Comment 1 Notify RN   Glucose, capillary     Status: Abnormal   Collection Time: 08/18/14  8:57 PM  Result Value Ref Range   Glucose-Capillary 116 (H) 65 - 99 mg/dL  Glucose, capillary     Status: Abnormal   Collection Time:  08/19/14  7:16 AM  Result Value Ref Range   Glucose-Capillary 139 (H) 65 - 99 mg/dL  Glucose, capillary     Status: None   Collection Time: 08/19/14 11:43 AM  Result Value Ref Range   Glucose-Capillary 80 65 - 99 mg/dL   Comment 1 Notify RN   Glucose, capillary     Status: None   Collection Time: 08/19/14  4:22 PM  Result Value Ref Range   Glucose-Capillary 99 65 - 99 mg/dL  Glucose, capillary     Status: Abnormal   Collection Time: 08/20/14  6:25 AM  Result Value Ref Range   Glucose-Capillary 121 (H) 65 - 99 mg/dL      HEENT: Left sided neck incision healing minimal eccymosis Cardio: RRR and no murmur Resp: CTA B/L and unlabored GI: BS positive and NT, ND Extremity:  Pulses positive and No Edema Skin:   Bruise ecchymosis , left dorsum of hand , hematoma, no sign of infection Neuro: Alert/Oriented, Abnormal Sensory absent LT in LUE, Abnormal Motor 3- Left delt ,3- bi, 2- tri, , 0/5 in Left hand3-HF KE, Inattention and Other Left field cut Musc/Skel:  No pain with Left wrist or finger AROM or PROM Gen NAD   Assessment/Plan: 1. Functional deficits secondary to Right MCA infarct with Left hemiparesis, left neglect and  anosognsia which require 3+ hours per day of interdisciplinary therapy in a comprehensive inpatient rehab setting. Physiatrist is providing close team supervision and 24 hour management of active medical problems listed below. Physiatrist and rehab team continue to assess barriers to discharge/monitor patient progress toward functional and medical goals.        FIM: FIM - Bathing Bathing Steps Patient Completed: Chest, Right Arm, Left Arm, Abdomen, Front perineal area, Buttocks, Right upper leg, Left upper leg, Right lower leg (including foot), Left lower leg (including foot) Bathing: 5: Supervision: Safety issues/verbal cues  FIM - Upper Body Dressing/Undressing Upper body dressing/undressing steps patient completed: Thread/unthread right sleeve of pullover  shirt/dresss, Pull shirt over trunk, Thread/unthread left sleeve of pullover shirt/dress, Put head through opening of pull over shirt/dress Upper body dressing/undressing: 4: Min-Patient completed 75 plus % of tasks FIM - Lower Body Dressing/Undressing Lower body dressing/undressing steps patient completed: Thread/unthread right underwear leg, Fasten/unfasten pants, Thread/unthread left underwear leg, Pull underwear up/down, Thread/unthread right pants leg, Don/Doff left sock, Don/Doff right sock, Thread/unthread left pants leg, Pull pants up/down Lower body dressing/undressing: 5: Supervision: Safety issues/verbal cues  FIM - Toileting Toileting steps completed by patient: Adjust clothing prior to toileting, Adjust clothing after toileting, Performs perineal hygiene Toileting Assistive Devices: Grab bar or rail for support Toileting: 5: Supervision: Safety issues/verbal cues  FIM - Radio producer Devices: Grab bars, Oncologist Transfers: 5-To toilet/BSC: Supervision (verbal cues/safety issues), 5-From toilet/BSC: Supervision (verbal cues/safety issues)  FIM - Control and instrumentation engineer Devices: Best boy: 5: Bed > Chair or W/C: Supervision (verbal cues/safety issues), 5: Chair or W/C > Bed: Supervision (verbal cues/safety issues)  FIM - Locomotion: Wheelchair Distance: 60 Locomotion: Wheelchair: 1: Total Assistance/staff pushes wheelchair (Pt<25%) FIM - Locomotion: Ambulation Locomotion: Ambulation Assistive Devices: Nurse, adult Ambulation/Gait Assistance: 4: Min guard Locomotion: Ambulation: 4: Travels 150 ft or more with minimal assistance (Pt.>75%)  Comprehension Comprehension Mode: Auditory Comprehension: 5-Understands complex 90% of the time/Cues < 10% of the time  Expression Expression Mode: Verbal Expression: 5-Expresses complex 90% of the time/cues < 10% of the time  Social Interaction Social Interaction:  6-Interacts appropriately with others with medication or extra time (anti-anxiety, antidepressant).  Problem Solving Problem Solving: 4-Solves basic 75 - 89% of the time/requires cueing 10 - 24% of the time  Memory Memory: 4-Recognizes or recalls 75 - 89% of the time/requires cueing 10 - 24% of the time  Medical Problem List and Plan: 1. Functional deficits secondary to right MCA infarct 2.  DVT Prophylaxis/Anticoagulation: Subcutaneous Lovenox for DVT prophylaxis. Will D/C , amb better and has dorsum hand hematoma 3. Pain Management: Percocet as needed. Monitor with increased mobility 4. Right carotid stenosis. Status post carotid endarterectomy 08/05/2014 5. Neuropsych: This patient is capable of making decisions on her own behalf. 6. Skin/Wound Care: Routine skin checks 7. Fluids/Electrolytes/Nutrition:   I and O's with follow-up chemistries 8. Dysphagia. Dysphagia #2 sent liquids follow-up speech therapy, adequate po fluid intake 9. Hypertension. Lisinopril 5 mg daily. M  10. Diabetes mellitus. Hemoglobin A1c 8.5. Glucophage 500 mg twice a day.   -blood sugars well controlled, Re c Glucometer to check qd CBG at home 11. Hypothyroidism. Synthroid 12. Hyperlipidemia. Lipitor 13. IV infiltration left hand. Conservative care/wrapping,elevation, ecchymosis reducing,  14. 6 mm pulmonary nodule identified on CT and GL chest. Recommendations follow-up CT of the chest in 6-12 months  LOS (Days) 14 A FACE TO FACE EVALUATION WAS PERFORMED  Dayja Loveridge E 08/20/2014, 7:29 AM

## 2014-08-20 NOTE — Progress Notes (Signed)
Social Work Patient ID: Carla Hopkins, female   DOB: 03/23/1951, 64 y.o.   MRN: [id number] Met with pt who has hired assist for home and feels much better about this.  Daughter flying in tomorrow to assist with transition home Friday. Pt continues to deny deficits and lack of awareness from CVA. Recommendation is 24 hr care but will have 10 hr per day.  Aware of team conference And concerns team has regarding her going home without 24 hr care.  [facility] to provide home health follow up and getting [facility] along with tub bench from [facility]. She is getting tub bench via friends can get at cost since she is employed by [employer].  Feels good about discharge Friday. See daughter tomorrow when here.

## 2014-08-21 ENCOUNTER — Encounter: Payer: Self-pay | Admitting: *Deleted

## 2014-08-21 ENCOUNTER — Inpatient Hospital Stay (HOSPITAL_COMMUNITY): Payer: BLUE CROSS/BLUE SHIELD

## 2014-08-21 ENCOUNTER — Encounter: Payer: BLUE CROSS/BLUE SHIELD | Admitting: Vascular Surgery

## 2014-08-21 ENCOUNTER — Inpatient Hospital Stay (HOSPITAL_COMMUNITY): Payer: BLUE CROSS/BLUE SHIELD | Admitting: Occupational Therapy

## 2014-08-21 ENCOUNTER — Other Ambulatory Visit: Payer: Self-pay | Admitting: *Deleted

## 2014-08-21 ENCOUNTER — Inpatient Hospital Stay (HOSPITAL_COMMUNITY): Payer: BLUE CROSS/BLUE SHIELD | Admitting: Speech Pathology

## 2014-08-21 DIAGNOSIS — I6523 Occlusion and stenosis of bilateral carotid arteries: Secondary | ICD-10-CM

## 2014-08-21 LAB — GLUCOSE, CAPILLARY
Glucose-Capillary: 123 mg/dL — ABNORMAL HIGH (ref 65–99)
Glucose-Capillary: 87 mg/dL (ref 65–99)
Glucose-Capillary: 130 mg/dL — ABNORMAL HIGH (ref 65–99)
Glucose-Capillary: 143 mg/dL — ABNORMAL HIGH (ref 65–99)

## 2014-08-21 NOTE — Progress Notes (Signed)
Speech Language Pathology Discharge Summary  Patient Details  Name: Carla Hopkins MRN: [id number] Date of Birth: 07/11/1950  Today's Date: 08/21/2014 SLP Individual Time: 0800-0830 SLP Individual Time Calculation (min): 30 min   Skilled Therapeutic Interventions:  Pt was seen for skilled ST targeting cognitive goals and completion of patient education prior to discharge.  Upon arrival, pt was seated upright in the recliner, awake, alert, and agreeable to participate in ST.  SLP administered the [facility] standardized cognitive assessment to measure progress made while inpatient.  Pt scored 20 out of 30 on the abovementioned assessment (norm > or =26) which indicates mild-moderate cognitive deficits.  Errors were characterized by deficits for visuospatial/executive function skills, decreased error awareness, decreased higher level attention and problem solving for serial subtraction, and decreased abstract reasoning.  SLP reviewed and reinforced recommendation that pt have assistance for medication and financial management, which pt reports paid caregiver will be able to provide.  Education is complete at this time; however, pt would benefit from continued education at next level of care to address cognitive deficits in the home environment.      Patient has met 5 of 6 long term goals.  Patient to discharge at overall Supervision level.  Reasons goals not met: Pt requires min assist for complex functional problem solving    Clinical Impression/Discharge Summary:  Pt made functional gains while inpatient and is discharging at an overall supervision level of assistance and having met 5 out of 6 long term goals.  Pt has demonstrated improvements in left attention and functional problem solving from initial evaluation; however, she continues to present with decreased emergent awareness of deficits which impacts her safety during home management and/or self care tasks.  Pt is currently consuming regular  textures and thin liquids with mod I use of swallowing precautions to monitor and correct left sided buccal residue.  Pt is discharging home with intermittent supervision from paid caregivers and friends/neighbors.  SLP continues to recommend that pt have 24/7 supervision, assistance for mediation and financial management and ST follow up at next level of care to continue addressing cognitive deficits.  Also recommend continuing pt education at next level of care due to decreased awareness of deficits which impacts carryover of recommendations.    Care Partner:  Caregiver Able to Provide Assistance: Other (comment) (Pt is hiring caregivers for 10 hours per day and neighbors/friends will also provide intermittent assistance )  Type of Caregiver Assistance: Physical;Cognitive  Recommendation:  Home Health SLP;24 hour supervision/assistance  Rationale for SLP Follow Up: Maximize cognitive function and independence;Reduce caregiver burden   Equipment: none recommended by SLP    Reasons for discharge: Discharged from hospital   Patient/Family Agrees with Progress Made and Goals Achieved: Yes   See FIM for current functional status  Page, Nicole L 08/21/2014, 7:50 AM    

## 2014-08-21 NOTE — Progress Notes (Signed)
Physical Therapy Discharge Summary  Patient Details  Name: Carla Hopkins MRN: [id number] Date of Birth: 1951-01-20  Today's Date: 08/21/2014 PT Individual Time: 1445-1600 PT Individual Time Calculation (min): 75 min   Patient has met 10 of 11 long term goals due to improved activity tolerance, improved balance, improved postural control, increased strength, ability to compensate for deficits, functional use of  left lower extremity, improved attention, improved awareness and improved coordination.  Patient to discharge at an ambulatory level Supervision.   Patient's care partner unavailable to provide the necessary cognitive assistance at discharge. Pt will have paid caregivers for 10 hrs per day; they were unavailable to come in for training.  Dtr lives in [place] and unavailable to come in for training.  PT is concerned about lack of supervision due to pt's poor insight and L neglect.  She is ineligible to go to SNF, and insisted on going home. She is a high fall risk; a hand out was given to pt detailing the risk factors for her, and discussed with her at length.  Reasons goals not met: pt refused to attempt floor transfer due to reported L knee OA.   Recommendation:  Patient will benefit from ongoing skilled PT services in home health setting to continue to advance safe functional mobility, address ongoing impairments in L attention, L hip strength, LLE sensory deficits, balance,  and minimize fall risk.  Equipment: NBQC  Reasons for discharge: treatment goals met and discharge from hospital  Patient/family agrees with progress made and goals achieved: Yes   tx today: dynamic standing balance activities: ring toss over 6' distance, retrieving rings from L with R hand, stepping forward with L foot during swing after VCS, without LOB, 1/8 ringers.  Pt retrieved 7/8 rings from floor; she missed 1 on her L.  External perturbations to challenge balance, eliciting ankle and hip strategies. Simulated  car transfer to small SUV ht with supervision, with visual cues to bring LLE in/out of car. Discussed balance strategies at length. Gait up/down curb/ramp/ 12 steps with R rail, x 150' x 2 on level tile, with supervision, NBQC. Toilet transfer with NBQC, superviison, demonstrating L neglect and body habitus limitations for managing L side of panties; required assistance. Pt left reclined in recliner, all needs in place.  PT Discharge Precautions/Restrictions Precautions Precautions: Fall Precaution Comments: severe L sided inattention Restrictions Weight Bearing Restrictions: No   Pain Pain Assessment Pain Assessment: No/denies pain Vision/Perception     Cognition- A and O x 4; poor insight, judgement and memory for new information   Sensation Sensation Light Touch: Appears Intact (LLE) Proprioception: Impaired Detail Proprioception Impaired Details: Impaired RUE;Impaired LLE Additional Comments: kinesthia impaired; position sense absent L knee and diminished ankle Coordination Gross Motor Movements are Fluid and Coordinated: Yes Fine Motor Movements are Fluid and Coordinated: No Heel Shin Test: accuracy and speed impaired L, but improved since admission Motor  Motor Motor: Hemiplegia Motor - Discharge Observations: no hypertonus hamstrings; improved L hip strength without substituting ERs  Mobility Bed Mobility Bed Mobility:  (modified independent for all) Transfers Transfers: Yes Stand Pivot Transfers: 5: Supervision Stand Pivot Transfer Details: Verbal cues for precautions/safety Stand Pivot Transfer Details (indicate cue type and reason): L inattention Locomotion  Ambulation Ambulation: Yes Ambulation/Gait Assistance: 5: Supervision Ambulation Distance (Feet): 150 Feet Assistive device: Small based quad cane Ambulation/Gait Assistance Details: Verbal cues for sequencing;Verbal cues for precautions/safety Gait Gait: Yes Gait Pattern: Impaired Gait Pattern:  Step-through pattern;Decreased hip/knee flexion - left;Decreased dorsiflexion -  left;Decreased weight shift to left;Trendelenburg;Left flexed knee in stance;Decreased trunk rotation;Narrow base of support;Lateral hip instability;Poor foot clearance - left Gait velocity: 1.73'/sec Stairs / Additional Locomotion Stairs: Yes Stairs Assistance: 5: Supervision Stairs Assistance Details: Verbal cues for precautions/safety Stairs Assistance Details (indicate cue type and reason): VCs for clearance of L foot at times Stair Management Technique: One rail Right;Alternating pattern (variable self selected pattern; without knee buckling; unable to remember sequencing) Number of Stairs: 12 Height of Stairs: 3 (8 3" high; 4 7" high) Ramp: 5: Supervision Curb: 5: Supervision Wheelchair Mobility Wheelchair Mobility: No  Trunk/Postural Assessment  Cervical Assessment Cervical Assessment: Within Functional Limits Thoracic Assessment Thoracic Assessment: Within Functional Limits Lumbar Assessment Lumbar Assessment: Within Functional Limits Postural Control Postural Control: Within Functional Limits Postural Limitations: drifts L when fatigued during gait  Balance Balance Balance Assessed: Yes Standardized Balance Assessment Standardized Balance Assessment: Berg Balance Test (dated 09/19/14) Berg Balance Test Sit to Stand: Able to stand without using hands and stabilize independently Standing Unsupported: Able to stand safely 2 minutes Sitting with Back Unsupported but Feet Supported on Floor or Stool: Able to sit safely and securely 2 minutes Stand to Sit: Sits safely with minimal use of hands Transfers: Able to transfer safely, minor use of hands Standing Unsupported with Eyes Closed: Able to stand 10 seconds safely Standing Ubsupported with Feet Together: Able to place feet together independently and stand for 1 minute with supervision From Standing, Reach Forward with Outstretched Arm: Can reach  confidently >25 cm (10") From Standing Position, Pick up Object from Floor: Able to pick up shoe safely and easily From Standing Position, Turn to Look Behind Over each Shoulder: Looks behind from both sides and weight shifts well Turn 360 Degrees: Able to turn 360 degrees safely but slowly Standing Unsupported, Alternately Place Feet on Step/Stool: Able to complete >2 steps/needs minimal assist Standing Unsupported, One Foot in Front: Able to plae foot ahead of the other independently and hold 30 seconds Standing on One Leg: Tries to lift leg/unable to hold 3 seconds but remains standing independently Total Score: 46 Static Sitting Balance Static Sitting - Level of Assistance: 7: Independent Dynamic Sitting Balance Dynamic Sitting - Level of Assistance: 7: Independent Static Standing Balance Static Standing - Level of Assistance: 5: Stand by assistance Dynamic Standing Balance Dynamic Standing - Level of Assistance: 5: Stand by assistance Extremity Assessment      RLE Assessment RLE Assessment: Within Functional Limits RLE Strength RLE Overall Strength Comments: grossly in sitting, 5/5 throughout LLE Assessment LLE Assessment: Within Functional Limits LLE Strength LLE Overall Strength Comments: grossly in sitting: hip flex (with less ER) 4/5, hip abd/add 4+/5, knee ext/flex 4+/5, ankle DF 5/5 LLE Tone LLE Tone Comments: normal tone  See FIM for current functional status  Eliseo Withers,[place] 08/21/2014, 4:30 PM

## 2014-08-21 NOTE — Discharge Summary (Signed)
Discharge summary job [phone]

## 2014-08-21 NOTE — Discharge Summary (Signed)
NAMESUNSHYNE, Hopkins              ACCOUNT NO.:  [id number]  MEDICAL RECORD NO.:  [id number]  LOCATION:  4M01C                        FACILITY:  [facility]  PHYSICIAN:  Carla Hopkins, M.D.DATE OF BIRTH:  1950/07/02  DATE OF ADMISSION:  08/06/2014 DATE OF DISCHARGE:  08/22/2014                              DISCHARGE SUMMARY   DISCHARGE DIAGNOSES: 1. Functional deficits secondary to right middle cerebral artery     infarction. 2. Subcutaneous Lovenox for deep vein thrombosis prophylaxis. 3. Right carotid stenosis status post endarterectomy on Aug 05, 2014. 4. Dysphagia. 5. Hypertension. 6. Diabetes mellitus with peripheral neuropathy. 7. Hypothyroidism. 8. Hyperlipidemia. 9. IV infiltration, left hand. 10.A 6-mm pulmonary nodule identified on CT chest with recommendations     to follow up 6-12 months.  HISTORY OF PRESENT ILLNESS:  This is a 64 year old right-handed female with history of tobacco abuse, hypertension who lives independently prior to admission, working full time.  Admitted on July 24, 2014, after a fall with left-sided weakness.  No head trauma.  MRI of the brain showed multifocal patchy acute right MCA territory infarcts, additional 8-mm cortical infarct within the parasagittal cortical gray matter of the anterior right frontal lobe.  MRA of the head with right MCA M2 inferior division occlusion.  CT angiogram of the chest negative except for a 6-mm pulmonary nodule on the right with recommendations to follow up CT of the chest in 6-12 months.  Elevated troponin 4.17. Echocardiogram with ejection fraction of 38%, grade 1 diastolic dysfunction.  Carotid Dopplers with left 50-60 and right 80% ICA stenosis.  The patient did not receive tPA.  Vascular Surgery consulted in relation to ICA stenosis, plan for carotid surgery.  Follow up Cardiology Services for elevated troponin, not felt to be due to cardiac event.  No further workup indicated.  Neurology consulted,  maintained on aspirin and Plavix therapy.  Subcutaneous heparin for DVT prophylaxis. Findings of elevated hemoglobin A1c 8.5, maintained on Glucophage. Dysphagia #2, nectar thick liquid diet.  The patient was admitted for a comprehensive rehab program on Jul 30, 2014, requiring min to mod assist. Arrangements made for Vascular Surgery for right CEA.  Discharge to acute care services.  Underwent carotid surgery on Aug 05, 2014, per Dr. Oneida Hopkins.  The patient's Plavix was discontinued, maintained on only aspirin.  Subcutaneous Lovenox for DVT prophylaxis.  Recent infiltration of left hand from IV that had been monitored closely with some increased swelling and bruising.  She was readmitted for a comprehensive rehab program.  PAST MEDICAL HISTORY:  See discharge diagnoses.  SOCIAL HISTORY:  Lives alone, independent prior to admission. Functional status upon admission to rehab service was min to mod assist for functional mobility, max assist for loss of balance.  PHYSICAL EXAMINATION:  VITAL SIGNS:  Blood pressure 98/62, pulse 67, temperature 97, respirations 18. GENERAL:  This was an alert female, in no acute distress.  Speech mildly dysarthric.  Good insight and awareness of deficits. LUNGS:  Clear to auscultation. CARDIAC:  Regular rate and rhythm. ABDOMEN:  Soft, nontender.  Good bowel sounds. NECK:  Carotid surgical site clean and dry with mild redness. EXTREMITIES:  Infiltration site left hand and wrist with swelling, tenderness  to touch, and bruising with good palpable pulses.  REHABILITATION HOSPITAL COURSE:  The patient was admitted to inpatient rehab services with therapies initiated on a 3-hour daily basis consisting of physical therapy, occupational therapy, and rehabilitation nursing.  The following issues were addressed during the patient's rehabilitation stay.  Pertaining to Carla Hopkins's right MCA infarct, remained on aspirin alone.  She would follow up with Neurology  Services. Her diet was steadily advanced to a regular consistency.  She had undergone right carotid surgery on Aug 05, 2014, surgical site healing nicely.  She would follow with Vascular Surgery.  Blood pressures remained well controlled on lisinopril.  Findings of elevated hemoglobin A1c 8.5, maintained on Glucophage, full diabetic teaching.  Hormone supplement for hypothyroidism.  During workup of CVA with incidental finding of a 6-mm pulmonary nodule, identified on CT of the chest, with recommendations to follow up 6-12 months.  IV infiltration, left hand. Conservative care with wrapping and elevation.  Ecchymosis continued to improve.  The patient received weekly collaborative interdisciplinary team conferences to discuss estimated length of stay, family teaching, and any barriers to discharge.  She was ambulating 150 feet with a quad cane, minimal guard to supervision.  Gait, activity, stepping through a floor ladder from step length with cane, min to mod assist.  She can propel her wheelchair independently, navigate obstacles.  Gather belongings for activities of daily living and grooming, focusing on safety, ambulate to the bathroom from the apartment using her cane. Showered using long sponge with supervision assist to manage.  Full family teaching was completed and plan discharged to home.  DISCHARGE MEDICATIONS: 1. Aspirin 81 mg p.o. daily. 2. Lipitor 80 mg p.o. daily. 3. Celexa 10 mg p.o. daily. 4. Colace 100 mg p.o. daily. 5. Synthroid 88 mcg p.o. daily. 6. Lisinopril 5 mg p.o. daily. 7. Glucophage 500 mg p.o. b.i.d. 8. Robaxin 500 mg p.o. every 6 hours as needed muscle spasms. 9. Oxycodone 1-2 tablets every 4 hours as needed pain, dispense of 90     tablets. 10.Protonix 40 mg p.o. daily. 11.Vitamin D 50,000 units weekly.  DIET:  Diabetic diet.  SPECIAL INSTRUCTIONS:  Follow up CT of the chest 6-12 months for incidental findings of pulmonary nodule.  Conservative  care of left hand with wrapping, elevation to control edema.  The patient would follow up with Dr. Alysia Hopkins at the outpatient rehab center on September 19, 2014; Dr. Ruta Hopkins, Vascular Surgery, call for appointment; Dr. Erlinda Hong, Neurology Service, call for appointment in 1 month; Dr. Ellsworth Lennox on September 02, 2014, medical management.     Carla Hopkins, P.A.   ______________________________ Carla Hopkins, M.D.    DA/MEDQ  D:  08/21/2014  T:  08/21/2014  Job:  [id number]  cc:   Barton Fanny, M.D. Dr. Rosalin Hawking

## 2014-08-21 NOTE — Progress Notes (Signed)
Occupational Therapy Session Note  Patient Details  Name: Carla Hopkins MRN: [id number] Date of Birth: 01/13/1951  Today's Date: 08/21/2014 OT Individual Time: 0211-1735 OT Individual Time Calculation (min): 30 min   Short Term Goals: Week 1:  OT Short Term Goal 1 (Week 1): Pt will bathe with min A. OT Short Term Goal 1 - Progress (Week 1): Met OT Short Term Goal 2 (Week 1): Pt will toilet with min A. OT Short Term Goal 2 - Progress (Week 1): Progressing toward goal OT Short Term Goal 3 (Week 1): Pt will don shirt min A. OT Short Term Goal 3 - Progress (Week 1): Progressing toward goal OT Short Term Goal 4 (Week 1): Pt will don pants with mod A. OT Short Term Goal 4 - Progress (Week 1): Met OT Short Term Goal 5 (Week 1): Pt will be independent with self ROM of LUE. OT Short Term Goal 5 - Progress (Week 1): Progressing toward goal Week 2:  OT Short Term Goal 1 (Week 2): Pt will complete a stand pivot transfer to toilet with steadying A. OT Short Term Goal 1 - Progress (Week 2): Met OT Short Term Goal 2 (Week 2): Pt will don a tshirt with S. OT Short Term Goal 2 - Progress (Week 2): Partly met OT Short Term Goal 3 (Week 2): Pt will be able to don B feet into underwear and pants without A from a seated position. OT Short Term Goal 3 - Progress (Week 2): Met OT Short Term Goal 4 (Week 2): Pt will bathe with S using a long sponge to reach R arm. OT Short Term Goal 4 - Progress (Week 2): Met OT Short Term Goal 5 (Week 2): Pt will stand at sink with steadying A to brush teeth. OT Short Term Goal 5 - Progress (Week 2): Met Week 3:  OT Short Term Goal 1 (Week 3): STG=LTG d/t short remaining LOS  Skilled Therapeutic Interventions/Progress Updates:  Pt found seated in recliner with no complaints of pain. Therapist assisted pt > therapy gym. Pt transferred > edge of mat. While seated edge of mat focused on therapeutic exercise and NMR to help increase strength and functional use of LUE.  Encouraged pt to perform some taught exercises post CIR d/c. After exercises, pt ambulated > other gym for next PT session. Pt left seated in w/c waiting on PT.   Precautions:  Precautions Precautions: Fall Precaution Comments: severe L sided inattention Restrictions Weight Bearing Restrictions: No  See FIM for current functional status  Therapy/Group: Individual Therapy  Leeanne Butters , MS, OTR/L, CLT Pager: [phone]   08/21/2014, 2:52 PM

## 2014-08-21 NOTE — Progress Notes (Signed)
Occupational Therapy Session Note  Patient Details  Name: Carla Hopkins MRN: [id number] Date of Birth: 05-29-1950  Today's Date: 08/21/2014 OT Individual Time: 1000-1130 OT Individual Time Calculation (min): 90 min    Short Term Goals: Week 3:  OT Short Term Goal 1 (Week 3): STG=LTG d/t short remaining LOS  Skilled Therapeutic Interventions/Progress Updates:    Pt seen this session for ADL retraining of shower with tub bench transfers in ADL apt with dressing. Pt taken to apt via w/c as she stated she felt very tired today.  Pt ambulated with cane into bathroom and completed all transfers with supervision and no cuing. In shower, bathed with close S using long sponge to wash R arm and feet. Unfortunately, pt has struggled to progress with her dressing skills. Due to body habitus, no active L grasp, moderately impaired visual perceptual skills of spatial orientation, and decreased procedural memory with hemidressing techniques, pt continues to need total A to don bra, set up/ clothing orientation A with shirt, and mod A to fully pull underwear and pants over L hip.   Pt completed self care and was then transported to gym for L wrist and finger extension facilitation with estim. She has developing finger flexion, but no active wrist extension to aid with the grasp. Estim for 10 min at intensity 38. After estim, pt was not able to elicit active extension.  Reviewed visual and sensory limitations, recommendations for no driving and recommendations for 24 hr Supervision.  Pt taken back to room with all needs met.    Therapy Documentation Precautions:  Precautions Precautions: Fall Precaution Comments: severe L sided inattention Restrictions Weight Bearing Restrictions: No       Pain: Pain Assessment Pain Assessment: No/denies pain Pain Score: 0-No pain ADL:  See FIM for current functional status  Therapy/Group: Individual Therapy  [place] 08/21/2014, 11:56 AM

## 2014-08-21 NOTE — Progress Notes (Signed)
Subjective/Complaints: No issues overnite   No hand pain  Review of Systems - Negative except Left arm weak   Objective: Vital Signs: Blood pressure 125/69, pulse 74, temperature 98.6 F (37 C), temperature source Oral, resp. rate 18, height 5\' 2"  (1.575 m), weight 94.5 kg (208 lb 5.4 oz), SpO2 98 %. No results found. Results for orders placed or performed during the hospital encounter of 08/06/14 (from the past 72 hour(s))  Glucose, capillary     Status: None   Collection Time: 08/18/14 11:22 AM  Result Value Ref Range   Glucose-Capillary 99 65 - 99 mg/dL   Comment 1 Notify RN   Glucose, capillary     Status: None   Collection Time: 08/18/14  4:39 PM  Result Value Ref Range   Glucose-Capillary 93 65 - 99 mg/dL   Comment 1 Notify RN   Glucose, capillary     Status: Abnormal   Collection Time: 08/18/14  8:57 PM  Result Value Ref Range   Glucose-Capillary 116 (H) 65 - 99 mg/dL  Glucose, capillary     Status: Abnormal   Collection Time: 08/19/14  7:16 AM  Result Value Ref Range   Glucose-Capillary 139 (H) 65 - 99 mg/dL  Glucose, capillary     Status: None   Collection Time: 08/19/14 11:43 AM  Result Value Ref Range   Glucose-Capillary 80 65 - 99 mg/dL   Comment 1 Notify RN   Glucose, capillary     Status: None   Collection Time: 08/19/14  4:22 PM  Result Value Ref Range   Glucose-Capillary 99 65 - 99 mg/dL  Glucose, capillary     Status: Abnormal   Collection Time: 08/20/14  6:25 AM  Result Value Ref Range   Glucose-Capillary 121 (H) 65 - 99 mg/dL  Glucose, capillary     Status: None   Collection Time: 08/20/14 11:27 AM  Result Value Ref Range   Glucose-Capillary 78 65 - 99 mg/dL   Comment 1 Notify RN   Glucose, capillary     Status: Abnormal   Collection Time: 08/20/14  4:33 PM  Result Value Ref Range   Glucose-Capillary 126 (H) 65 - 99 mg/dL   Comment 1 Notify RN   Glucose, capillary     Status: Abnormal   Collection Time: 08/20/14  8:50 PM  Result Value Ref  Range   Glucose-Capillary 105 (H) 65 - 99 mg/dL  Glucose, capillary     Status: Abnormal   Collection Time: 08/21/14  6:40 AM  Result Value Ref Range   Glucose-Capillary 123 (H) 65 - 99 mg/dL      HEENT: Left sided neck incision healing minimal eccymosis Cardio: RRR and no murmur   Extremity:  Pulses positive and No Edema Skin:   Bruise ecchymosis , left dorsum of hand , hematoma, no sign of infection Neuro: Alert/Oriented, Abnormal Sensory absent LT in LUE, Abnormal Motor 3- Left delt ,3- bi, 2- tri, , 0/5 in Left hand3-HF KE, Inattention and Other Left field cut Musc/Skel:  No pain with Left wrist or finger AROM or PROM Gen NAD   Assessment/Plan: 1. Functional deficits secondary to Right MCA infarct with Left hemiparesis, left neglect and anosognsia which require 3+ hours per day of interdisciplinary therapy in a comprehensive inpatient rehab setting. Physiatrist is providing close team supervision and 24 hour management of active medical problems listed below. Physiatrist and rehab team continue to assess barriers to discharge/monitor patient progress toward functional and medical goals. Plan d/c in  am with rec of 24/7 sup       FIM: FIM - Bathing Bathing Steps Patient Completed: Chest, Right Arm, Left Arm, Abdomen, Front perineal area, Buttocks, Right upper leg, Left upper leg, Right lower leg (including foot), Left lower leg (including foot) Bathing: 5: Supervision: Safety issues/verbal cues  FIM - Upper Body Dressing/Undressing Upper body dressing/undressing steps patient completed: Thread/unthread right sleeve of pullover shirt/dresss, Thread/unthread left sleeve of pullover shirt/dress, Put head through opening of pull over shirt/dress (3/6 steps) Upper body dressing/undressing: 3: Mod-Patient completed 50-74% of tasks FIM - Lower Body Dressing/Undressing Lower body dressing/undressing steps patient completed: Thread/unthread right underwear leg, Fasten/unfasten pants,  Thread/unthread left underwear leg, Thread/unthread right pants leg, Thread/unthread left pants leg, Fasten/unfasten right shoe, Fasten/unfasten left shoe Lower body dressing/undressing: 3: Mod-Patient completed 50-74% of tasks  FIM - Toileting Toileting steps completed by patient: Adjust clothing prior to toileting, Performs perineal hygiene, Adjust clothing after toileting Toileting Assistive Devices: Grab bar or rail for support Toileting: 6: More than reasonable amount of time  FIM - Radio producer Devices: Grab bars, Oncologist Transfers: 5-To toilet/BSC: Supervision (verbal cues/safety issues), 5-From toilet/BSC: Supervision (verbal cues/safety issues)  FIM - Control and instrumentation engineer Devices: Best boy: 5: Bed > Chair or W/C: Supervision (verbal cues/safety issues), 5: Chair or W/C > Bed: Supervision (verbal cues/safety issues)  FIM - Locomotion: Wheelchair Distance: 60 Locomotion: Wheelchair: 1: Total Assistance/staff pushes wheelchair (Pt<25%) FIM - Locomotion: Ambulation Locomotion: Ambulation Assistive Devices: Nurse, adult Ambulation/Gait Assistance: 5: Supervision Locomotion: Ambulation: 5: Travels 150 ft or more with supervision/safety issues  Comprehension Comprehension Mode: Auditory Comprehension: 5-Understands complex 90% of the time/Cues < 10% of the time  Expression Expression Mode: Verbal Expression: 5-Expresses complex 90% of the time/cues < 10% of the time  Social Interaction Social Interaction: 6-Interacts appropriately with others with medication or extra time (anti-anxiety, antidepressant).  Problem Solving Problem Solving: 5-Solves basic problems: With no assist  Memory Memory: 6-More than reasonable amt of time  Medical Problem List and Plan: 1. Functional deficits secondary to right MCA infarct 2.  DVT Prophylaxis/Anticoagulation: Subcutaneous Lovenox for DVT prophylaxis. Will D/C  , amb better and has dorsum hand hematoma 3. Pain Management: Percocet as needed. Monitor with increased mobility 4. Right carotid stenosis. Status post carotid endarterectomy 08/05/2014 5. Neuropsych: This patient is capable of making decisions on her own behalf. 6. Skin/Wound Care: Routine skin checks 7. Fluids/Electrolytes/Nutrition:   I and O's with follow-up chemistries 8. Dysphagia. Dysphagia #2 sent liquids follow-up speech therapy, adequate po fluid intake 9. Hypertension. Lisinopril 5 mg daily. M  10. Diabetes mellitus. Hemoglobin A1c 8.5. Glucophage 500 mg twice a day.   -blood sugars well controlled, Re c Glucometer to check qd CBG at home 11. Hypothyroidism. Synthroid 12. Hyperlipidemia. Lipitor 13. IV infiltration left hand. Conservative care/wrapping,elevation, ecchymosis reducing,                                                14. 6 mm pulmonary nodule identified on CT and GL chest. Recommendations follow-up CT of the chest in 6-12 months  LOS (Days) 15 A FACE TO FACE EVALUATION WAS PERFORMED  Carla Hopkins E 08/21/2014, 6:59 AM

## 2014-08-21 NOTE — Progress Notes (Signed)
Occupational Therapy Discharge Summary  Patient Details  Name: Carla Hopkins MRN: [id number] Date of Birth: 1950-10-03   Patient has met 6 of 10 long term goals due to improved activity tolerance, improved balance, postural control, ability to compensate for deficits, functional use of  LEFT upper and LEFT lower extremity, improved attention, improved awareness and improved coordination.  Patient to discharge at overall Supervision level with mobility and mod A with dressing.  Patient's care partner (a hired home health agency) is independent to provide the necessary physical and cognitive assistance at discharge.    Reasons goals not met: Pt ambulates with cane into bathroom and completes all transfers with supervision and no cuing. In shower, bathes with close S using long sponge to wash R arm and feet. Unfortunately, pt has struggled to progress with her dressing skills. Due to body habitus, no active L grasp, moderately impaired visual perceptual skills of spatial orientation, and decreased procedural memory with hemidressing techniques, pt continues to need total A to don bra, set up/ clothing orientation A with shirt, and mod A to fully pull underwear and pants over L hip for dressing and toileting. She continues to need cuing to fully attend to L visual space during mobility.  Recommendation:  Patient will benefit from ongoing skilled OT services in home health setting to continue to advance functional skills in the area of BADL and iADL.  Equipment: No equipment provided  Pt will obtain a tub bench on her own.  Reasons for discharge: treatment goals met  Patient/family agrees with progress made and goals achieved: Yes  OT Discharge ADL ADL ADL Comments: supervision bathing; A to fully pull pants up; total A with sports bra Vision/Perception  Vision- History Patient Visual Report: No change from baseline Vision- Assessment Eye Alignment: Within Functional Limits Ocular Range of  Motion: Within Functional Limits Tracking/Visual Pursuits: Able to track stimulus in all quads without difficulty Saccades: Decreased speed of saccadic movement (to the left) Convergence: Within functional limits Visual Fields: No apparent deficits  Cognition Orientation Level: Oriented X4 Sensation Sensation Light Touch Impaired Details: Impaired LUE Stereognosis: Impaired by gross assessment Hot/Cold: Impaired by gross assessment Proprioception Impaired Details: Impaired LUE Coordination Gross Motor Movements are Fluid and Coordinated: No Fine Motor Movements are Fluid and Coordinated: No Finger Nose Finger Test: unable to raise hand to mouth Motor  Motor Motor: Hemiplegia;Abnormal tone Motor - Discharge Observations: developing LUE motor control in shoulder and elbow Mobility    supervision with cane Trunk/Postural Assessment  Cervical Assessment Cervical Assessment: Within Functional Limits Thoracic Assessment Thoracic Assessment: Within Functional Limits Lumbar Assessment Lumbar Assessment: Within Functional Limits Postural Control Postural Control: Within Functional Limits  Balance Static Sitting Balance Static Sitting - Level of Assistance: 7: Independent Dynamic Sitting Balance Dynamic Sitting - Level of Assistance: 7: Independent Static Standing Balance Static Standing - Level of Assistance: 5: Stand by assistance Dynamic Standing Balance Dynamic Standing - Level of Assistance: 5: Stand by assistance Extremity/Trunk Assessment RUE Assessment RUE Assessment: Within Functional Limits LUE AROM (degrees) Left Shoulder Flexion: 45 Degrees Left Shoulder ABduction: 45 Degrees Left Elbow Flexion: 110 Left Wrist Flexion: 30 Degrees Left Composite Finger Flexion:  (10%)  See FIM for current functional status  SAGUIER,JULIA 08/21/2014, 12:20 PM

## 2014-08-21 NOTE — Discharge Instructions (Signed)
Inpatient Rehab Discharge Instructions  Carla Hopkins Discharge date and time: No discharge date for patient encounter.   Activities/Precautions/ Functional Status: Activity: activity as tolerated Diet: diabetic diet Wound Care: keep wound clean and dry Functional status:  ___ No restrictions     ___ Walk up steps independently ___ 24/7 supervision/assistance   ___ Walk up steps with assistance ___ Intermittent supervision/assistance  ___ Bathe/dress independently ___ Walk with walker     ___ Bathe/dress with assistance ___ Walk Independently    ___ Shower independently _x STROKE/TIA DISCHARGE INSTRUCTIONS SMOKING Cigarette smoking nearly doubles your risk of having a stroke & is the single most alterable risk factor  If you smoke or have smoked in the last 12 months, you are advised to quit smoking for your health.  Most of the excess cardiovascular risk related to smoking disappears within a year of stopping.  Ask you doctor about anti-smoking medications  [place] Quit Line: 1-800-QUIT NOW  Free Smoking Cessation Classes [phone]  CHOLESTEROL Know your levels; limit fat & cholesterol in your diet  Lipid Panel     Component Value Date/Time   CHOL 317* 07/25/2014 0016   TRIG 154* 07/25/2014 0016   HDL 46 07/25/2014 0016   CHOLHDL 6.9 07/25/2014 0016   VLDL 31 07/25/2014 0016   LDLCALC 240* 07/25/2014 0016      Many patients benefit from treatment even if their cholesterol is at goal.  Goal: Total Cholesterol (CHOL) less than 160  Goal:  Triglycerides (TRIG) less than 150  Goal:  HDL greater than 40  Goal:  LDL (LDLCALC) less than 100   BLOOD PRESSURE American Stroke Association blood pressure target is less that 120/80 mm/Hg  Your discharge blood pressure is:  BP: 114/62 mmHg  Monitor your blood pressure  Limit your salt and alcohol intake  Many individuals will require more than one medication for high blood pressure  DIABETES (A1c is a blood sugar average  for last 3 months) Goal HGBA1c is under 7% (HBGA1c is blood sugar average for last 3 months)  Diabetes:    Lab Results  Component Value Date   HGBA1C 8.5* 07/25/2014     Your HGBA1c can be lowered with medications, healthy diet, and exercise.  Check your blood sugar as directed by your physician  Call your physician if you experience unexplained or low blood sugars.  PHYSICAL ACTIVITY/REHABILITATION Goal is 30 minutes at least 4 days per week  Activity: Increase activity slowly, Therapies: Physical Therapy: Home Health Return to work:   Activity decreases your risk of heart attack and stroke and makes your heart stronger.  It helps control your weight and blood pressure; helps you relax and can improve your mood.  Participate in a regular exercise program.  Talk with your doctor about the best form of exercise for you (dancing, walking, swimming, cycling).  DIET/WEIGHT Goal is to maintain a healthy weight  Your discharge diet is: Diet Carb Modified Fluid consistency:: Thin; Room service appropriate?: Yes  liquids Your height is:  Height: 5\' 2"  (157.5 cm) Your current weight is: Weight: 94.5 kg (208 lb 5.4 oz) Your Body Mass Index (BMI) is:  BMI (Calculated): 38.1  Following the type of diet specifically designed for you will help prevent another stroke.  Your goal weight range is:    Your goal Body Mass Index (BMI) is 19-24.  Healthy food habits can help reduce 3 risk factors for stroke:  High cholesterol, hypertension, and excess weight.  RESOURCES Stroke/Support Group:  Call [phone]   STROKE EDUCATION PROVIDED/REVIEWED AND GIVEN TO PATIENT Stroke warning signs and symptoms How to activate emergency medical system (call 911). Medications prescribed at discharge. Need for follow-up after discharge. Personal risk factors for stroke. Pneumonia vaccine given:  Flu vaccine given:  My questions have been answered, the writing is legible, and I understand these  instructions.  I will adhere to these goals & educational materials that have been provided to me after my discharge from the hospital.    __ Walk with assistance    ___ Shower with assistance ___ No alcohol     ___ Return to work/school ________  Special Instructions:  Follow-up CT of the chest 6-12 months for incidental findings of 6 mm pulmonary nodule  COMMUNITY REFERRALS UPON DISCHARGE:    Home Health:   PT, OT, SP, RN    Agency:[facility]   Date of last service:08/22/2014  Medical Equipment/Items Ordered:[facility]-FRIENDS TO GET TUB BENCH FOR PT  Agency/Supplier:[facility]   [phone] Other:[facility] [place]  GENERAL COMMUNITY RESOURCES FOR PATIENT/FAMILY: Support Groups:[facility] SUPPORT GROUP Employment Assistance:DAUGHTER TO APPLY FOR  DISABILITY FOR MOM   My questions have been answered and I understand these instructions. I will adhere to these goals and the provided educational materials after my discharge from the hospital.  Patient/Caregiver Signature _______________________________ Date __________  Clinician Signature _______________________________________ Date __________  Please bring this form and your medication list with you to all your follow-up doctor's appointments.

## 2014-08-21 NOTE — Progress Notes (Signed)
Pt doing quite well with rehab.  She will be discharged tomorrow with HHPT.  Her incision is healing quite nicely.   She inquires about creams for incision.  For now, soap and water.  After it has healed for ~ 4-6 weeks, she can use cocoa butter, mederma if she wants.  Advised no swimming for 4 weeks to let incision continue to heal.  She will f/u with Dr. Oneida Alar in 6 months with carotid duplex.   Carla Hopkins 08/21/2014 10:16 AM

## 2014-08-21 NOTE — Progress Notes (Signed)
Social Work Patient ID: Carla Hopkins, female   DOB: Jan 25, 1951, 64 y.o.   MRN: [id number] Met with pt to discuss discharge tomorrow.  She feels prepared and is ready.  Her daughter is flying on later tonight to be here tomorrow for discharge and help with the Transition home.  She is staying through the weekend and then heading back to [place].  Pt will have her hired assistance starting Friday. [facility] to provide home health follow up. See tomorrow regarding any last minute questions.

## 2014-08-22 ENCOUNTER — Telehealth: Payer: Self-pay | Admitting: Vascular Surgery

## 2014-08-22 LAB — GLUCOSE, CAPILLARY: Glucose-Capillary: 134 mg/dL — ABNORMAL HIGH (ref 65–99)

## 2014-08-22 MED ORDER — ERGOCALCIFEROL 1.25 MG (50000 UT) PO CAPS: 50000.0000 [IU] | ORAL_CAPSULE | ORAL | Status: AC

## 2014-08-22 MED ORDER — PANTOPRAZOLE SODIUM 40 MG PO TBEC: 40.0000 mg | DELAYED_RELEASE_TABLET | Freq: Every day | ORAL | Status: DC

## 2014-08-22 MED ORDER — OXYCODONE-ACETAMINOPHEN 5-325 MG PO TABS: 1.0000 | ORAL_TABLET | ORAL | Status: DC | PRN

## 2014-08-22 MED ORDER — CITALOPRAM HYDROBROMIDE 10 MG PO TABS: 10.0000 mg | ORAL_TABLET | Freq: Every day | ORAL | Status: DC

## 2014-08-22 MED ORDER — METHOCARBAMOL 500 MG PO TABS: 500.0000 mg | ORAL_TABLET | Freq: Four times a day (QID) | ORAL | Status: DC | PRN

## 2014-08-22 MED ORDER — METFORMIN HCL 500 MG PO TABS: 500.0000 mg | ORAL_TABLET | Freq: Two times a day (BID) | ORAL | Status: DC

## 2014-08-22 MED ORDER — ATORVASTATIN CALCIUM 80 MG PO TABS: 80.0000 mg | ORAL_TABLET | Freq: Every day | ORAL | Status: DC

## 2014-08-22 MED ORDER — DOCUSATE SODIUM 100 MG PO CAPS: 100.0000 mg | ORAL_CAPSULE | Freq: Every day | ORAL | Status: DC

## 2014-08-22 MED ORDER — LISINOPRIL 5 MG PO TABS: 5.0000 mg | ORAL_TABLET | Freq: Every day | ORAL | Status: DC

## 2014-08-22 MED ORDER — LEVOTHYROXINE SODIUM 88 MCG PO TABS: 88.0000 ug | ORAL_TABLET | Freq: Every day | ORAL | Status: DC

## 2014-08-22 NOTE — Progress Notes (Signed)
Pt discharged to home with daughter. Discharge education given by Silvestre Mesi, PA with verbal understanding. Pt has no further questions regarding diabetes and wound care education.  Belongings with pt.

## 2014-08-22 NOTE — Progress Notes (Signed)
Subjective/Complaints: No issues overnite   No hand pain Denies bumping the hand , daughter in town since last noc  Review of Systems - Negative except Left arm weak   Objective: Vital Signs: Blood pressure 128/61, pulse 71, temperature 98.6 F (37 C), temperature source Oral, resp. rate 18, height 5\' 2"  (1.575 m), weight 94.5 kg (208 lb 5.4 oz), SpO2 99 %. No results found. Results for orders placed or performed during the hospital encounter of 08/06/14 (from the past 72 hour(s))  Glucose, capillary     Status: Abnormal   Collection Time: 08/19/14  7:16 AM  Result Value Ref Range   Glucose-Capillary 139 (H) 65 - 99 mg/dL  Glucose, capillary     Status: None   Collection Time: 08/19/14 11:43 AM  Result Value Ref Range   Glucose-Capillary 80 65 - 99 mg/dL   Comment 1 Notify RN   Glucose, capillary     Status: None   Collection Time: 08/19/14  4:22 PM  Result Value Ref Range   Glucose-Capillary 99 65 - 99 mg/dL  Glucose, capillary     Status: Abnormal   Collection Time: 08/20/14  6:25 AM  Result Value Ref Range   Glucose-Capillary 121 (H) 65 - 99 mg/dL  Glucose, capillary     Status: None   Collection Time: 08/20/14 11:27 AM  Result Value Ref Range   Glucose-Capillary 78 65 - 99 mg/dL   Comment 1 Notify RN   Glucose, capillary     Status: Abnormal   Collection Time: 08/20/14  4:33 PM  Result Value Ref Range   Glucose-Capillary 126 (H) 65 - 99 mg/dL   Comment 1 Notify RN   Glucose, capillary     Status: Abnormal   Collection Time: 08/20/14  8:50 PM  Result Value Ref Range   Glucose-Capillary 105 (H) 65 - 99 mg/dL  Glucose, capillary     Status: Abnormal   Collection Time: 08/21/14  6:40 AM  Result Value Ref Range   Glucose-Capillary 123 (H) 65 - 99 mg/dL  Glucose, capillary     Status: None   Collection Time: 08/21/14 11:39 AM  Result Value Ref Range   Glucose-Capillary 87 65 - 99 mg/dL  Glucose, capillary     Status: Abnormal   Collection Time: 08/21/14  4:44 PM   Result Value Ref Range   Glucose-Capillary 130 (H) 65 - 99 mg/dL  Glucose, capillary     Status: Abnormal   Collection Time: 08/21/14  8:31 PM  Result Value Ref Range   Glucose-Capillary 143 (H) 65 - 99 mg/dL      HEENT: Left sided neck incision healing minimal eccymosis Cardio: RRR and no murmur   Extremity:  Pulses positive and No Edema Skin:   Bruise ecchymosis , left dorsum of hand , hematoma, no sign of infection Neuro: Alert/Oriented, Abnormal Sensory absent LT in LUE, Abnormal Motor 3- Left delt ,3- bi, 2- tri, , 0/5 in Left hand3-HF KE, Inattention and Other Left field cut Musc/Skel:  No pain with Left wrist or finger AROM or PROM Gen NAD   Assessment/Plan: 1. Functional deficits secondary to Right MCA infarct with Left hemiparesis, left neglect and anosognsia  Stable for D/C today F/u PCP in 1-2 weeks F/u PM&R 4 weeks See D/C summary See D/C instructions      FIM: FIM - Bathing Bathing Steps Patient Completed: Chest, Right Arm, Left Arm, Abdomen, Front perineal area, Buttocks, Right upper leg, Left upper leg, Right lower leg (including  foot), Left lower leg (including foot) Bathing: 5: Supervision: Safety issues/verbal cues  FIM - Upper Body Dressing/Undressing Upper body dressing/undressing steps patient completed: Thread/unthread right sleeve of pullover shirt/dresss, Thread/unthread left sleeve of pullover shirt/dress, Put head through opening of pull over shirt/dress, Pull shirt over trunk (shirt only, total A bra) Upper body dressing/undressing: 3: Mod-Patient completed 50-74% of tasks FIM - Lower Body Dressing/Undressing Lower body dressing/undressing steps patient completed: Thread/unthread right underwear leg, Fasten/unfasten pants, Thread/unthread left underwear leg, Thread/unthread right pants leg, Thread/unthread left pants leg, Fasten/unfasten right shoe, Fasten/unfasten left shoe, Don/Doff left shoe Lower body dressing/undressing: 3: Mod-Patient completed  50-74% of tasks  FIM - Toileting Toileting steps completed by patient: Adjust clothing prior to toileting, Performs perineal hygiene Toileting Assistive Devices: Grab bar or rail for support Toileting: 3: Mod-Patient completed 2 of 3 steps (neglect of L side of panties, required physical assist to complete)  FIM - Radio producer Devices: Grab bars Toilet Transfers: 5-To toilet/BSC: Supervision (verbal cues/safety issues), 5-From toilet/BSC: Supervision (verbal cues/safety issues)  FIM - Control and instrumentation engineer Devices: Best boy: 5: Bed > Chair or W/C: Supervision (verbal cues/safety issues), 5: Supine > Sit: Supervision (verbal cues/safety issues), 6: Supine > Sit: No assist, 6: Sit > Supine: No assist  FIM - Locomotion: Wheelchair Distance: 60 Locomotion: Wheelchair: 1: Total Assistance/staff pushes wheelchair (Pt<25%) FIM - Locomotion: Ambulation Locomotion: Ambulation Assistive Devices: Nurse, adult Ambulation/Gait Assistance: 5: Supervision Locomotion: Ambulation: 5: Travels 150 ft or more with supervision/safety issues  Comprehension Comprehension Mode: Auditory Comprehension: 5-Understands basic 90% of the time/requires cueing < 10% of the time  Expression Expression Mode: Verbal Expression: 5-Expresses complex 90% of the time/cues < 10% of the time  Social Interaction Social Interaction: 6-Interacts appropriately with others with medication or extra time (anti-anxiety, antidepressant).  Problem Solving Problem Solving: 4-Solves basic 75 - 89% of the time/requires cueing 10 - 24% of the time  Memory Memory: 5-Recognizes or recalls 90% of the time/requires cueing < 10% of the time  Medical Problem List and Plan: 1. Functional deficits secondary to right MCA infarct 2.  DVT Prophylaxis/Anticoagulation: Subcutaneous Lovenox for DVT prophylaxis. Will D/C , amb better and has dorsum hand hematoma 3. Pain  Management: Percocet as needed. Monitor with increased mobility 4. Right carotid stenosis. Status post carotid endarterectomy 08/05/2014 5. Neuropsych: This patient is capable of making decisions on her own behalf. 6. Skin/Wound Care: Routine skin checks 7. Fluids/Electrolytes/Nutrition:   I and O's with follow-up chemistries 8. Dysphagia. improved 9. Hypertension. Lisinopril 5 mg daily. M  10. Diabetes mellitus. Hemoglobin A1c 8.5. Glucophage 500 mg twice a day.   -blood sugars well controlled, Re c Glucometer to check qd CBG at home 11. Hypothyroidism. Synthroid 12. Hyperlipidemia. Lipitor 13. IV infiltration left hand. Conservative care/wrapping,elevation, ecchymosis reducing,   Pt declines surgical referral for drainage                                           14. 6 mm pulmonary nodule identified on CT and GL chest. Recommendations follow-up CT of the chest in 6-12 months  LOS (Days) 16 A FACE TO FACE EVALUATION WAS PERFORMED  Celinda Dethlefs E 08/22/2014, 6:43 AM

## 2014-08-22 NOTE — Telephone Encounter (Signed)
-----   Message from Mena Goes, RN sent at 08/21/2014 10:51 AM EDT ----- Regarding: Schedule   ----- Message -----    From: Gabriel Earing, PA-C    Sent: 08/21/2014  10:17 AM      To: [facility] Charge Pool  S/p right CEA 5/10.  F/u with Dr. Oneida Alar in 6 months with carotid duplex.  Thanks, Aldona Bar

## 2014-08-22 NOTE — Progress Notes (Signed)
Social Work Discharge Note Discharge Note  The overall goal for the admission was met for:   Discharge location: Yes-HOME WITH PRIVATE DUTY ASSIST-10 HR PER DAY AND FRIENDS ASSIST  Length of Stay: Yes-23 DAYS  Discharge activity level: Yes-SUPERVISION-SOME MIN ASSIST  Home/community participation: Yes  Services provided included: MD, RD, PT, OT, SLP, RN, CM, TR, Pharmacy, Neuropsych and SW  Financial Services: Private Insurance: BCBS  Follow-up services arranged: Home Health: [facility]-PT,OT,SP,RN,SW, DME: [facility]-FRIENDS TO GET TUB BENCH and Patient/Family request agency HH: [facility], DME: [facility]  Comments (or additional information):EDUCATION COMPLETED, HAS HIRED CNA TO PROVIDE 10 HR OF CARE-AWARE OF THE RECOMMENDATION OF 24 HR CARE. PT AT HIGH RISK TO FALL DUE TO LEFT SIDED NEGLECT AND INATTENTION. SHE VOICED AWARENESS OF TEAM'S CONCERNS.  Patient/Family verbalized understanding of follow-up arrangements: Yes  Individual responsible for coordination of the follow-up plan: SELF & COURTNEY-DAUGHTER  Confirmed correct DME delivered: Dupree, Rebecca G 08/22/2014    Dupree, Rebecca G 

## 2014-08-22 NOTE — Telephone Encounter (Signed)
LM for pt, mailed letter, dpm

## 2014-08-25 ENCOUNTER — Other Ambulatory Visit: Payer: Self-pay

## 2014-08-25 MED ORDER — LANCETS MISC: Status: DC

## 2014-08-25 MED ORDER — BLOOD GLUCOSE MONITOR KIT: PACK | Status: DC

## 2014-08-25 MED ORDER — GLUCOSE BLOOD VI STRP
ORAL_STRIP | Status: DC
Start: 2014-08-25 — End: 2014-09-12

## 2014-09-02 ENCOUNTER — Telehealth: Payer: Self-pay | Admitting: Neurology

## 2014-09-02 ENCOUNTER — Encounter: Payer: Self-pay | Admitting: Physician Assistant

## 2014-09-02 ENCOUNTER — Ambulatory Visit (INDEPENDENT_AMBULATORY_CARE_PROVIDER_SITE_OTHER): Payer: BLUE CROSS/BLUE SHIELD | Admitting: Physician Assistant

## 2014-09-02 VITALS — BP 139/85 | HR 98 | Temp 98.6°F | Resp 16 | Ht 62.0 in | Wt 194.6 lb

## 2014-09-02 DIAGNOSIS — E119 Type 2 diabetes mellitus without complications: Secondary | ICD-10-CM | POA: Diagnosis not present

## 2014-09-02 DIAGNOSIS — I639 Cerebral infarction, unspecified: Secondary | ICD-10-CM

## 2014-09-02 DIAGNOSIS — R911 Solitary pulmonary nodule: Secondary | ICD-10-CM | POA: Diagnosis not present

## 2014-09-02 DIAGNOSIS — S60222A Contusion of left hand, initial encounter: Secondary | ICD-10-CM | POA: Diagnosis not present

## 2014-09-02 DIAGNOSIS — Z09 Encounter for follow-up examination after completed treatment for conditions other than malignant neoplasm: Secondary | ICD-10-CM

## 2014-09-02 DIAGNOSIS — I10 Essential (primary) hypertension: Secondary | ICD-10-CM

## 2014-09-02 MED ORDER — BLOOD GLUCOSE MONITOR KIT: PACK | Status: DC

## 2014-09-02 NOTE — Telephone Encounter (Signed)
Spoke with Dr Everlene Farrier, [facility], re: Dr Everlene Farrier saw pt today and is questioning if she should be on ASA 81 mg or Plavix, or both. He states she was on both medications in the hospital but is now only on ASA. He requests neurology input. Patient was seen by Dr Erlinda Hong in hospital on 07/25/14. Informed Dr Everlene Farrier that Dr Erlinda Hong is in hospital but this RN will route his question to Dr Erlinda Hong and call him back today asap. He verbalized understanding, appreciation. Dr Everlene Farrier requests call back on his cell # [phone].

## 2014-09-02 NOTE — Telephone Encounter (Signed)
Sheketia from [facility] called wanting to confirm with Dr. Leonie Man if the patient should be on plavix and aspirin or only one of them Please call and advise. Guerry Bruin can be reached @ [phone]

## 2014-09-02 NOTE — Progress Notes (Signed)
Subjective:    Patient ID: Carla Hopkins, female    DOB: 1950/06/24, 64 y.o.   MRN: [id number]  Chief Complaint  Patient presents with  . Follow-up    hospital follow up for stroke  . Cerebrovascular Accident  . hmatoma    outside left hand   Patient Active Problem List   Diagnosis Date Noted  . History of right MCA stroke 08/06/2014  . Carotid stenosis 08/05/2014  . Acute right MCA stroke 07/30/2014  . Cerebral infarction due to thrombosis of right middle cerebral artery   . Thyroid activity decreased   . Left-sided weakness   . Stroke   . Essential hypertension   . Carotid artery stenosis   . CVA (cerebral infarction) 07/24/2014  . Hyperlipidemia 07/24/2014  . Diabetes 07/24/2014  . Elevated troponin 07/24/2014  . Tobacco abuse 07/24/2014  . Benign essential HTN 07/23/2014  . Obstructive sleep apnea 07/23/2014  . Hypothyroidism 07/03/2014  . Type II or unspecified type diabetes mellitus without mention of complication, not stated as uncontrolled 09/25/2013  . BMI 38.0-38.9,adult 05/19/2013  . Nicotine addiction 05/19/2013  . Right atrial enlargement 05/19/2013   Prior to Admission medications   Medication Sig Start Date End Date Taking? Authorizing Provider  aspirin 81 MG chewable tablet Chew 1 tablet (81 mg total) by mouth daily. 07/27/14  Yes Trousdale N Rumley, DO  atorvastatin (LIPITOR) 80 MG tablet Take 1 tablet (80 mg total) by mouth daily at 6 PM. 08/22/14  Yes Lavon Paganini Angiulli, PA-C  blood glucose meter kit and supplies KIT Test blood sugar once daily. Dx code: E11.9 08/25/14  Yes Jaynee Eagles, PA-C  citalopram (CELEXA) 10 MG tablet Take 1 tablet (10 mg total) by mouth daily. 08/22/14  Yes Daniel J Angiulli, PA-C  ergocalciferol (DRISDOL) 50000 UNITS capsule Take 1 capsule (50,000 Units total) by mouth once a week. 08/22/14 08/22/15 Yes Daniel J Angiulli, PA-C  glucose blood test strip Test blood sugar once daily. Dx code: E11.9 08/25/14  Yes Jaynee Eagles, PA-C  Lancets  MISC Test blood sugar once daily. Dx code: E11.9 08/25/14  Yes Jaynee Eagles, PA-C  levothyroxine (SYNTHROID, LEVOTHROID) 88 MCG tablet Take 1 tablet (88 mcg total) by mouth daily. 08/22/14  Yes Daniel J Angiulli, PA-C  lisinopril (PRINIVIL,ZESTRIL) 5 MG tablet Take 1 tablet (5 mg total) by mouth daily. 08/22/14  Yes Daniel J Angiulli, PA-C  metFORMIN (GLUCOPHAGE) 500 MG tablet Take 1 tablet (500 mg total) by mouth 2 (two) times daily with a meal. 08/22/14  Yes Daniel J Angiulli, PA-C  albuterol (PROVENTIL HFA;VENTOLIN HFA) 108 (90 BASE) MCG/ACT inhaler Inhale 2 puffs into the lungs every 4 (four) hours as needed for wheezing or shortness of breath (cough, shortness of breath or wheezing.). Patient not taking: Reported on 07/03/2014 12/12/13   Roselee Culver, MD  blood glucose meter kit and supplies KIT Dispense based on patient and insurance preference. Use up to four times daily as directed. (FOR ICD-9 250.00, 250.01). 09/02/14   Araceli Bouche, PA  docusate sodium (COLACE) 100 MG capsule Take 1 capsule (100 mg total) by mouth daily. Patient not taking: Reported on 09/02/2014 08/22/14   Lavon Paganini Angiulli, PA-C  methocarbamol (ROBAXIN) 500 MG tablet Take 1 tablet (500 mg total) by mouth every 6 (six) hours as needed for muscle spasms. Patient not taking: Reported on 09/02/2014 08/22/14   Lavon Paganini Angiulli, PA-C  oxyCODONE-acetaminophen (PERCOCET/ROXICET) 5-325 MG per tablet Take 1-2 tablets by mouth every 4 (four) hours as  needed for moderate pain. Patient not taking: Reported on 09/02/2014 08/22/14   Lavon Paganini Angiulli, PA-C  pantoprazole (PROTONIX) 40 MG tablet Take 1 tablet (40 mg total) by mouth daily. Patient not taking: Reported on 09/02/2014 08/22/14   Lavon Paganini Angiulli, PA-C   Medications, allergies, past medical history, surgical history, family history, social history and problem list reviewed and updated.  HPI  64 yof presents for hospital f/u visit along with hematoma left hand.   Brief hx: Admitted to  hospital 07/24/14 after presenting to ED with acute left arm paralysis. CT head showed acute right MCA territory infarct, confirmed by MRI. She was outside of the TPA window. Echo showed normal EF with grade I diastolic dys. She also had elevated troponins in hospital which were attributed to rhabdomyolysis. She was started on 81 asa, 75 plavix, 80 lipitor, dc to rehab facility. She had carotid US which showed 80% RICA stenosis, had carotid endarterectomy 08/05/14.   She has now been back home for past couple wks. She is living alone but has caregiver with her 8 hrs day. She has a visiting RN seeing her for her left hand wound which is discussed below. She is doing PT 2x week for stability and OT 2x week for left hand function. She feels she is slowly recovering from the stroke. Left hand function not complete but improving. Able to walk unassisted but uses cane if going long distances. 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   Of note she was also diag with DM2 with a1c 8.5 at that visit and started on metformin 500 bid.  DM2- states she has been taking metformin as prescribed. Wants a new glucometer. She brings her BG readings with her which are typically in 120-140s range. States she has changed her diet quite a bit.  HTN - she is only taking the lisinopril 5 mg qd for bp. Denies dizzy episodes at home. BP failry well controlled today in clinic.  Pulm nodules on CT - Seen in hospital. Repeat 6-12 months per radiology note. She has stopped smoking completely. Had a 50 pack year hx.   She states she had blood drawn on her left hand in hospital which then stayed in dependent position for significant amount of time after the stroke. She subsequently developed a large hematoma left hand. Home RN checking on it 3x week. States they do not think it is infected and she has never been on antibiotics for it.   Review of Systems No fevers, chills, cp, sob, ha,  vision changes.     Objective:   Physical Exam  Constitutional: She is oriented to person, place, and time. She appears well-developed and well-nourished.  Non-toxic appearance. She does not have a sickly appearance. She does not appear ill. No distress.  BP 139/85 mmHg  Pulse 98  Temp(Src) 98.6 F (37 C) (Oral)  Resp 16  Ht _0  (1.575 m)  Wt 194 lb 9.6 oz (88.27 kg)  BMI 35.58 kg/m2  SpO2 93%   Eyes: EOM are normal. Pupils are equal, round, and reactive to light.  Cardiovascular: Normal rate, regular rhythm and normal heart sounds.   Pulmonary/Chest: Effort normal and breath sounds normal.  Musculoskeletal:  Large hematoma dorsal side left hand. Two 1cm size ulcerations dorsal side hand. No surrounding erythema. No warmth. No streaking up left arm. Decreased strength left hand secondary to recent cva. 2+ radial pulse.  Neurological: She is alert and oriented to person, place, and time.  Psychiatric: She has a normal mood and affect. Her speech is normal and behavior is normal.      Assessment & Plan:   51 yof presents for hospital f/u visit along with hematoma left hand.   Hospital discharge follow-up CVA (cerebral vascular accident) --recovering well, will be seeing neurology in August and having a repeat carotid US in November --feels her strength is slowly returning with pt/ot --she is taking asa, statin as prescribed. Has not been taking plavix, reading through hospital dc notes not sure if she should be on this or not. **Addendum -- Dr Everlene Farrier spoke with Dr Felecia Shelling who informed him the pt should not be on plavix at this time and that she is f/u with their team on June 24th** --has dc smoking --continuing pt and ot each twice weekly for now --return to see [place] one month  Diabetes mellitus without complication - Plan: blood glucose meter kit and supplies KIT --newly diag in hospital --continue metformin, pt declined labs today, instructed we will likely repeat a1c, check urine  ma, give 2nd pneumonia vaccine at f/u appt --pt seeing her optometrist next month --declines diabetes education referral at this time --script given for new glucometer per pt request  Benign essential HTN --fairly well controlled today, continue lisinopril 5 mg qd --cmp next visit  Traumatic hematoma of left hand, initial encounter --wrapped again today, healing per pt and no current signs of infx --continue with 3x week RN home visits for wound care  Lung nodule seen on imaging study --will plan to repeat in 6-12 months --has stopped smoking since stroke  Julieta Gutting, PA-C Physician Assistant-Certified [facility]  09/03/2014 9:15 PM

## 2014-09-02 NOTE — Patient Instructions (Signed)
You are looking great today! Congratulations on stopping smoking! Keep up the good work. Keep at it with the metformin, lisinopril, aspirin, and statin medication.  We would like to see you back in one month to see how you're doing, for further diabetes check and for some different lab work.  We may have you give a urine sample at that time and give you the second pneumonia vaccine.  In the meantime your visiting nurse, PT, OT are taking great care of you. We contacted neurology and when we hear back about the plavix will let you know.

## 2014-09-03 NOTE — Addendum Note (Signed)
Addendum  created 09/03/14 1323 by Roberts Gaudy, MD   Modules edited: Anesthesia Responsible Staff

## 2014-09-03 NOTE — Telephone Encounter (Signed)
Discussed Dr Perfecto Kingdom concerns/questions with Dr Park Liter who states patient should be maintained on ASA alone re: 08/21/14 discharge note per Lauraine Rinne, PA stating Plavix was d/c. Dr Felecia Shelling states that since patient has no new symptoms, and note specifically mentioned Plavix was d/c, we can maintain her on ASA. He was made aware of her upcoming appointment with Lauraine Rinne PA on 09/19/14.  Called Dr Perfecto Kingdom and informed him of Dr Garth Bigness reply. Also informed him of Ms Brinegar's appointment in D Angiulli's office on 09/19/14. He verbalized understanding, appreciation.

## 2014-09-03 NOTE — Telephone Encounter (Signed)
rec'd a phone call from Florian Buff, RN.  There is a question on whether the patient should be taking aspirin 81 or lipitor 80 mg or both.   Dr. Everlene Farrier is asking for clarification so he can advise the patient. Dr. Caren Griffins phone number is in the thread below. Please advise

## 2014-09-04 NOTE — Telephone Encounter (Signed)
Dr. Felecia Shelling has talked with Dr. Everlene Farrier and pt will stay on ASA 81mg  monotherapy. Thanks.  Rosalin Hawking, MD PhD Stroke Neurology 09/04/2014 3:53 PM

## 2014-09-10 ENCOUNTER — Telehealth: Payer: Self-pay

## 2014-09-10 NOTE — Telephone Encounter (Signed)
Called Carla Hopkins to clarify message. Spoke with pt, she needs a blood glucometer that can be checked through her skin since she is unable to use one hand due to a stroke. She does not have anyone to help her and doing finger pricks can be very difficult. Please advise. Rx pended.

## 2014-09-10 NOTE — Telephone Encounter (Signed)
Carla Hopkins from [facility] would like to know if the Dr would prescribe a Glucometer for a skin test, states she has had a stroke and isn't able to do another way Please call [phone]

## 2014-09-11 MED ORDER — UNABLE TO FIND: Status: DC

## 2014-09-11 NOTE — Telephone Encounter (Signed)
Spoke with pt advised  Rx ready to pick up.

## 2014-09-11 NOTE — Telephone Encounter (Signed)
I have printed out the script for a skin glucometer per patient's request. Please let her know that this is available for pick and if she is unable to pick this up, please mail the script to her. Thank you!

## 2014-09-12 ENCOUNTER — Telehealth: Payer: Self-pay | Admitting: Physical Medicine & Rehabilitation

## 2014-09-12 ENCOUNTER — Telehealth: Payer: Self-pay

## 2014-09-12 MED ORDER — GLUCOSE BLOOD VI STRP: ORAL_STRIP | Status: DC

## 2014-09-12 NOTE — Telephone Encounter (Signed)
Patient is calling because she didn't get the testing strips with the glucometer. The brand is Regulatory affairs officer. She states that she's out and isn't able to test her blood sugar. Patient phone: [phone]

## 2014-09-12 NOTE — Telephone Encounter (Signed)
Rx for test strips sent in. Pt notified.

## 2014-09-15 DIAGNOSIS — Z87891 Personal history of nicotine dependence: Secondary | ICD-10-CM

## 2014-09-15 DIAGNOSIS — I10 Essential (primary) hypertension: Secondary | ICD-10-CM | POA: Diagnosis not present

## 2014-09-15 DIAGNOSIS — E1140 Type 2 diabetes mellitus with diabetic neuropathy, unspecified: Secondary | ICD-10-CM

## 2014-09-15 DIAGNOSIS — I69354 Hemiplegia and hemiparesis following cerebral infarction affecting left non-dominant side: Secondary | ICD-10-CM | POA: Diagnosis not present

## 2014-09-15 DIAGNOSIS — I69351 Hemiplegia and hemiparesis following cerebral infarction affecting right dominant side: Secondary | ICD-10-CM | POA: Diagnosis not present

## 2014-09-15 DIAGNOSIS — R1310 Dysphagia, unspecified: Secondary | ICD-10-CM | POA: Diagnosis not present

## 2014-09-15 DIAGNOSIS — E785 Hyperlipidemia, unspecified: Secondary | ICD-10-CM

## 2014-09-15 DIAGNOSIS — E039 Hypothyroidism, unspecified: Secondary | ICD-10-CM

## 2014-09-19 ENCOUNTER — Telehealth: Payer: Self-pay

## 2014-09-19 ENCOUNTER — Inpatient Hospital Stay: Payer: BLUE CROSS/BLUE SHIELD | Admitting: Physical Medicine & Rehabilitation

## 2014-09-19 ENCOUNTER — Ambulatory Visit: Payer: BLUE CROSS/BLUE SHIELD

## 2014-09-19 NOTE — Telephone Encounter (Signed)
°  Patient called again wanting to know when she can get the order for treatment on her open wound.  Please call ASAP  [phone]

## 2014-09-19 NOTE — Telephone Encounter (Signed)
Pt states that [facility] has been attempting to get a hold of [employer], [place] for the past two weeks to get authorization for home wound care. Patient has an open wound on her hand. Please advise patient with a status, placing on high priority due to patient stating this has been ongoing the past two weeks.

## 2014-09-19 NOTE — Telephone Encounter (Signed)
It looks like the patient doesn't qualify for HOME wound care because she has not qualified as homebound.  Shee the note from Dr. Letta Pate' office.  Please refer this patient to the [facility].

## 2014-09-19 NOTE — Telephone Encounter (Signed)
Patient states Carla Hopkins also needs to call [facility] to authorize managed care

## 2014-09-19 NOTE — Telephone Encounter (Signed)
Spoke with [facility] and they said she already has skilled nursing coming out every week and she could get them to help her with the wound.

## 2014-09-23 NOTE — Telephone Encounter (Signed)
Spoke with pt, she will call [employer] to see why they will not pay for her visits. She states the nurse stop coming to her home.

## 2014-09-25 ENCOUNTER — Telehealth: Payer: Self-pay

## 2014-09-25 NOTE — Telephone Encounter (Signed)
Carla Hopkins needs a refill authorized for her blood glucose test strips. There is no more refills left and she's completely out. Please call as soon as possible.

## 2014-09-26 MED ORDER — GLUCOSE BLOOD VI STRP: ORAL_STRIP | Status: DC

## 2014-09-26 NOTE — Telephone Encounter (Signed)
Test strips sent in. Pt notified.

## 2014-09-30 ENCOUNTER — Encounter: Payer: Self-pay | Admitting: Physician Assistant

## 2014-09-30 ENCOUNTER — Ambulatory Visit (INDEPENDENT_AMBULATORY_CARE_PROVIDER_SITE_OTHER): Payer: BLUE CROSS/BLUE SHIELD | Admitting: Physician Assistant

## 2014-09-30 VITALS — BP 136/86 | HR 92 | Temp 98.4°F | Resp 16 | Ht 61.5 in | Wt 183.8 lb

## 2014-09-30 DIAGNOSIS — S61402D Unspecified open wound of left hand, subsequent encounter: Secondary | ICD-10-CM | POA: Diagnosis not present

## 2014-09-30 DIAGNOSIS — F32A Depression, unspecified: Secondary | ICD-10-CM

## 2014-09-30 DIAGNOSIS — I639 Cerebral infarction, unspecified: Secondary | ICD-10-CM | POA: Diagnosis not present

## 2014-09-30 DIAGNOSIS — R911 Solitary pulmonary nodule: Secondary | ICD-10-CM | POA: Diagnosis not present

## 2014-09-30 DIAGNOSIS — F329 Major depressive disorder, single episode, unspecified: Secondary | ICD-10-CM

## 2014-09-30 DIAGNOSIS — E119 Type 2 diabetes mellitus without complications: Secondary | ICD-10-CM | POA: Diagnosis not present

## 2014-09-30 MED ORDER — SILVER SULFADIAZINE 1 % EX CREA: 1.0000 "application " | TOPICAL_CREAM | Freq: Every day | CUTANEOUS | Status: DC

## 2014-09-30 NOTE — Patient Instructions (Addendum)
I have placed a future order to have you come back in about one month to check some blood and a urine sample.  You don't need to have an appointment for this, just come by in about one month. I'll let you know the results.  For the wound, we've referred you to home health wound care today. I'll let you know what they say.  If it's denied we suggest you go to the wound care center and can arrange this for you.  For now, please keep the wound open to the air when you're home and clean. Please wash with warm soap/water (orange dial soap) once daily. Please apply silvadene antibiotic topical once daily after showering. Please cover the wound when you're heading out.  Please decrease your celexa dose to 5 mg day for the next 3 weeks, then you can stop the medicine completely.  Please plan to see [place] in 3 months for a complete physical.  Please let us know if we need to do anything for you after you see the neurologist in order for them to release you to drive.

## 2014-09-30 NOTE — Progress Notes (Signed)
Subjective:    Patient ID: Carla Hopkins, female    DOB: April 03, 1950, 64 y.o.   MRN: [id number]  Chief Complaint  Patient presents with  . Follow-up    CVA and LEFT HAND and PHARMACY has changed to [facility]   Medications, allergies, past medical history, surgical history, family history, social history and problem list reviewed and updated.  HPI  64 yof presents for f/u.   Last seen at [facility] 6/7 for hospital f/u after right mca territory cva. We determined at that time per neuro she shouldn't be on plavix. She will be seeing neuro for f/u next month.   Today states she is doing well. She has finished PT for stability and OT for left hand function. She was receving twice weekly RN home visits for her slowly healing left hand wound. These stopped last week due to insurance determining she was no longer home bound. She has kept the left hand wrapped past 8 days without looking at it. Movement and strength in left hand have both improved but still feels grasp is weak.   She is still getting around ok, sometimes using cane if fatigued.   DMII - brings logs today. Running mostly 110s past 1-2 wks. She denies hypoglycemia sx. She will be seeing optometry in couple months. Taking metformin 500 bid.   She mentions she would like to stop the celexa today. Was placed on it while hospitalized for cva. Had never been on antidepressants prior. Denies depression or sadness.   She is not utd on any preventative screenings. She is planning to schedule cpe in a few months as she is basically homebound now since she can't drive. Due for mammo, colonoscopy, pap. Tdap and zoster are due.   She is focused now on getting approved to drive so she can start working again.   Review of Systems No cp, sob, fevers, chills, dizziness, vision changes.     Objective:   Physical Exam  Constitutional: She appears well-developed and well-nourished.  Non-toxic appearance. She does not have a sickly appearance. She  does not appear ill. No distress.  BP 136/86 mmHg  Pulse 92  Temp(Src) 98.4 F (36.9 C) (Oral)  Resp 16  Ht 5' 1.5" (1.562 m)  Wt 183 lb 12.8 oz (83.371 kg)  BMI 34.17 kg/m2   Skin:  Wound dorsum of left hand. Improved from last month. Large hematoma over dorsum of hand, mild maceration. Two ulcers dorsum of hand with exposed granulation tissue and eschar. Possible purulence over ulcers. Culture obtained.       Assessment & Plan:   Open wound of hand, left, subsequent encounter - Plan: Ambulatory referral to [facility], silver sulfADIAZINE (SILVADENE) 1 % cream, Wound culture --wound healing very slowly, has been covered 100% time past 8 days --culture obtained from ulcers --wound care instructions reviewed, silvadene qd --referral for home health for further wound care is pt is essentially home bound since she cannot drive at this time --will likely refer to wound center if home healh denied  Diabetes mellitus without complication - Plan: POCT glycosylated hemoglobin (Hb A1C), Microalbumin, urine, COMPLETE METABOLIC PANEL WITH GFR --pt declined labs today as she is worried about further upper extremity injury --future orders placed for a1c, urine ma, cmp, pt agreeable to obtain in 1 month  CVA (cerebral vascular accident)  Lung nodule seen on imaging study --from hospital, imaging due in 6 months  Depression --diagnosed while in hospital --pt denies current sx --decrease celexa to 5  mg for 3 wks, then dc if feeling ok  Pt to rtc in 3 months for cpe and preventative testing.  Greater than 40 minutes spent with pt discussing options for her wound and wound care, along with future requirements in order for her to drive again.   Julieta Gutting, PA-C Physician Assistant-Certified [facility]  10/01/2014 9:13 AM

## 2014-10-02 ENCOUNTER — Telehealth: Payer: Self-pay | Admitting: Physician Assistant

## 2014-10-02 MED ORDER — DOXYCYCLINE HYCLATE 100 MG PO CAPS: 100.0000 mg | ORAL_CAPSULE | Freq: Two times a day (BID) | ORAL | Status: DC

## 2014-10-02 NOTE — Telephone Encounter (Signed)
Patient left a message on the disabilities VM stating that she gave some disability paperwork to [facility], PA-C during her OV with him on 7/5/20126. What is the status on these forms? Disabilities department does not have her paperwork, they did not come to our department. Please return to disabilities tray at checkout when they are finished. Thank you.  Carla Hopkins

## 2014-10-02 NOTE — Addendum Note (Signed)
Addended byJulieta Gutting on: 10/02/2014 09:11 AM   Modules accepted: Orders

## 2014-10-02 NOTE — Telephone Encounter (Signed)
Received forms back from [place]. Will finish completing and call patient.

## 2014-10-03 LAB — WOUND CULTURE: Gram Stain: NONE SEEN

## 2014-10-13 ENCOUNTER — Ambulatory Visit (HOSPITAL_BASED_OUTPATIENT_CLINIC_OR_DEPARTMENT_OTHER): Payer: BLUE CROSS/BLUE SHIELD | Admitting: Physical Medicine & Rehabilitation

## 2014-10-13 ENCOUNTER — Encounter: Payer: Self-pay | Admitting: Physical Medicine & Rehabilitation

## 2014-10-13 ENCOUNTER — Encounter: Payer: BLUE CROSS/BLUE SHIELD | Attending: Physical Medicine & Rehabilitation

## 2014-10-13 VITALS — BP 136/78 | HR 105 | Resp 14

## 2014-10-13 DIAGNOSIS — R414 Neurologic neglect syndrome: Secondary | ICD-10-CM | POA: Diagnosis not present

## 2014-10-13 DIAGNOSIS — I69354 Hemiplegia and hemiparesis following cerebral infarction affecting left non-dominant side: Secondary | ICD-10-CM | POA: Insufficient documentation

## 2014-10-13 DIAGNOSIS — Z87891 Personal history of nicotine dependence: Secondary | ICD-10-CM | POA: Diagnosis not present

## 2014-10-13 DIAGNOSIS — G8190 Hemiplegia, unspecified affecting unspecified side: Secondary | ICD-10-CM | POA: Diagnosis not present

## 2014-10-13 DIAGNOSIS — G8194 Hemiplegia, unspecified affecting left nondominant side: Secondary | ICD-10-CM | POA: Insufficient documentation

## 2014-10-13 DIAGNOSIS — E039 Hypothyroidism, unspecified: Secondary | ICD-10-CM | POA: Diagnosis not present

## 2014-10-13 DIAGNOSIS — I10 Essential (primary) hypertension: Secondary | ICD-10-CM | POA: Insufficient documentation

## 2014-10-13 NOTE — Patient Instructions (Addendum)
Keep up with Home exercise program from Home health therapist  Follow up with neurologist next month, ask if you are ready for driving or back to walk

## 2014-10-13 NOTE — Progress Notes (Signed)
Subjective:    Patient ID: Carla Hopkins, female    DOB: 05-Jun-1950, 64 y.o.   MRN: [id number] DATE OF ADMISSION:  07/30/2014 DATE OF DISCHARGE:  08/05/2014 64 y.o. right handed female with history of tobacco abuse, hypertension. Patient lives alone independently prior to admission and working full time. Admitted 07/24/2014 after a fall noting left-sided weakness. No head trauma no loss of consciousness. MRI of the brain shows multifocal patchy acute right MCA territory infarcts. Additional 8 mm cortical infarct within the parasagittal cortical gray matter of the anterior right frontal lobe, likely right ACA distribution.  HPI  Patient has received home health services as well as hired help in the home after discharging from the hospital. The patient now has a caregiver only 4 hours per week mainly for shopping and cleaning. She does her own cooking she does her dressing and bathing independently. She is followed up with primary care physician who prescribed Silvadene for the left hand hematoma. This has improved and no longer has any open areas.  no falls at home, no near falls Patient is concerned about losing her job next week. She is currently unable to go back to it based on her stroke deficits. She will follow-up with neurology next month for reevaluation. PQH9 score 0  Pain Inventory Average Pain 0 Pain Right Now 0 My pain is NO PAIN  In the last 24 hours, has pain interfered with the following? General activity 0 Relation with others 0 Enjoyment of life 0 What TIME of day is your pain at its worst? NO PAIN Sleep (in general) Good  Pain is worse with: NO PAIN Pain improves with: NO PAIN Relief from Meds: 0  Mobility walk without assistance how many minutes can you walk? 10 ability to climb steps?  yes do you drive?  no  Function disabled: date disabled 06/2014  Neuro/Psych No problems in this area  Prior Studies Any changes since last visit?  no  Physicians  involved in your care Any changes since last visit?  no   Family History  Problem Relation Age of Onset  . Adopted: Yes   History   Social History  . Marital Status: Single    Spouse Name: N/A  . Number of Children: N/A  . Years of Education: N/A   Social History Main Topics  . Smoking status: Former Smoker -- 20.00 packs/day    Types: Cigarettes  . Smokeless tobacco: Not on file     Comment: quit smoking July 24, 2014  . Alcohol Use: 0.0 oz/week    0 Standard drinks or equivalent per week     Comment: ocassionally - wine  . Drug Use: No  . Sexual Activity: Not Currently   Other Topics Concern  . None   Social History Narrative   Past Surgical History  Procedure Laterality Date  . Tubal ligation    . Endarterectomy Right 08/05/2014    Procedure: Right Carotid ENDARTERECTOMY ;  Surgeon: Elam Dutch, MD;  Location: [place];  Service: Vascular;  Laterality: Right;  . Patch angioplasty Right 08/05/2014    Procedure: PATCH ANGIOPLASTY;  Surgeon: Elam Dutch, MD;  Location: [place];  Service: Vascular;  Laterality: Right;   Past Medical History  Diagnosis Date  . Hypothyroidism   . Hypertension    BP 136/78 mmHg  Pulse 105  Resp 14  SpO2 95%  Opioid Risk Score:   Fall Risk Score: Low Fall Risk (0-5 points)`1  Depression screen [facility] 2/9  Depression screen [facility] 2/9 09/30/2014 07/23/2014 09/25/2013 07/02/2013  Decreased Interest 0 0 0 0  Down, Depressed, Hopeless 0 0 0 0  PHQ - 2 Score 0 0 0 0     Review of Systems  Constitutional: Negative.   HENT: Negative.   Eyes: Negative.   Respiratory: Negative.   Cardiovascular: Negative.   Gastrointestinal: Negative.   Endocrine: Negative.   Genitourinary: Negative.   Musculoskeletal: Negative.   Skin: Negative.   Allergic/Immunologic: Negative.   Neurological: Negative.   Hematological: Negative.   Psychiatric/Behavioral: Negative.        Objective:   Physical Exam  Constitutional: She is oriented to  person, place, and time. She appears well-developed and well-nourished.  HENT:  Head: Normocephalic and atraumatic.  Eyes: Conjunctivae and EOM are normal. Pupils are equal, round, and reactive to light.  Neck: Normal range of motion.  Cardiovascular: Normal rate and regular rhythm.   Pulmonary/Chest: Effort normal and breath sounds normal.  Abdominal: Soft. Bowel sounds are normal.  Neurological: She is alert and oriented to person, place, and time. She displays no atrophy. A sensory deficit is present. No cranial nerve deficit. She exhibits abnormal muscle tone. Coordination and gait abnormal.  Reflex Scores:      Tricep reflexes are 2+ on the right side and 3+ on the left side.      Bicep reflexes are 2+ on the right side and 3+ on the left side.      Brachioradialis reflexes are 2+ on the right side and 3+ on the left side.      Patellar reflexes are 2+ on the right side and 3+ on the left side.      Achilles reflexes are 2+ on the right side and 3+ on the left side. Wide based gait, no assistive device or orthosis    Psychiatric: She has a normal mood and affect.  Nursing note and vitals reviewed.  Visual field to confrontation testing showed left neglect. Sensation is reduced in the left foot and absent in the left hand to light touch. Motor strength in the left upper is 3 minus at the deltoid bicep tricep finger flexors and extensors in the left side Left hip flexor and knee extensor ankle dorsiflexion plantar flexor are graded as 4/5 Right side is 5/5 in the upper and lower extremity       Assessment & Plan:  1. Right MCA distribution infarct with left hemiparesis left neglect and left hemisensory deficits. Overall has made an excellent improvement from her stroke. She is now living independently. She still not ready for driving or back to work. She will be reassessed by neurology next month for both of these issues. She would benefit from outpatient therapy however she does  not have funding for this. She will be losing her job next week.  At this point she'll need to follow-up with vascular surgery for history of carotid endarterectomy Primary care physician - hyperlipidemia and diabetes Neurology for secondary stroke prevention I will see her back on an as-needed basis

## 2014-10-14 ENCOUNTER — Telehealth: Payer: Self-pay | Admitting: Physical Medicine & Rehabilitation

## 2014-10-15 ENCOUNTER — Telehealth: Payer: Self-pay

## 2014-10-15 ENCOUNTER — Encounter: Payer: Self-pay | Admitting: *Deleted

## 2014-10-15 MED ORDER — ATORVASTATIN CALCIUM 80 MG PO TABS: 80.0000 mg | ORAL_TABLET | Freq: Every day | ORAL | Status: DC

## 2014-10-15 MED ORDER — METFORMIN HCL 500 MG PO TABS: 500.0000 mg | ORAL_TABLET | Freq: Two times a day (BID) | ORAL | Status: DC

## 2014-10-15 NOTE — Telephone Encounter (Signed)
Pt states that she is needing refills on her metphormin  And her staton she was given these at the hospital and ask that Carla Hopkins refill

## 2014-10-15 NOTE — Telephone Encounter (Signed)
Per Dr. Letta Pate office visit note patient is not been released to go back to work due to her stroke deficits and has been referred to Neurology for re-evaluation.  Faxed letter Attn: Elisabeth Cara.

## 2014-10-15 NOTE — Telephone Encounter (Signed)
Refills sent. Pt notified.

## 2014-10-28 ENCOUNTER — Other Ambulatory Visit (INDEPENDENT_AMBULATORY_CARE_PROVIDER_SITE_OTHER): Payer: Self-pay | Admitting: Family Medicine

## 2014-10-28 ENCOUNTER — Telehealth: Payer: Self-pay | Admitting: Physician Assistant

## 2014-10-28 DIAGNOSIS — E119 Type 2 diabetes mellitus without complications: Secondary | ICD-10-CM

## 2014-10-28 DIAGNOSIS — R945 Abnormal results of liver function studies: Principal | ICD-10-CM

## 2014-10-28 DIAGNOSIS — R7989 Other specified abnormal findings of blood chemistry: Secondary | ICD-10-CM

## 2014-10-28 LAB — COMPLETE METABOLIC PANEL WITH GFR
ALK PHOS: 172 U/L — AB (ref 33–130)
ALT: 82 U/L — ABNORMAL HIGH (ref 6–29)
AST: 88 U/L — AB (ref 10–35)
Albumin: 3.8 g/dL (ref 3.6–5.1)
BUN: 12 mg/dL (ref 7–25)
CHLORIDE: 104 mmol/L (ref 98–110)
CO2: 22 mmol/L (ref 20–31)
Calcium: 9.9 mg/dL (ref 8.6–10.4)
Creat: 0.74 mg/dL (ref 0.50–0.99)
GFR, EST NON AFRICAN AMERICAN: 86 mL/min (ref >=60)
GFR, Est African American: >89 mL/min (ref >=60)
GLUCOSE: 109 mg/dL — AB (ref 65–99)
POTASSIUM: 3.7 mmol/L (ref 3.5–5.3)
Sodium: 140 mmol/L (ref 135–146)
TOTAL PROTEIN: 7 g/dL (ref 6.1–8.1)
Total Bilirubin: 0.7 mg/dL (ref 0.2–1.2)

## 2014-10-28 LAB — HEMOGLOBIN A1C
Hgb A1c MFr Bld: 6 % — ABNORMAL HIGH (ref <5.7)
Mean Plasma Glucose: 126 mg/dL — ABNORMAL HIGH (ref <117)

## 2014-10-28 LAB — MICROALBUMIN, URINE: MICROALB UR: 1.5 mg/dL (ref <2.0)

## 2014-10-28 NOTE — Telephone Encounter (Signed)
RUQ [place] ordered for newly elevated lfts.

## 2014-11-25 ENCOUNTER — Encounter: Payer: Self-pay | Admitting: Neurology

## 2014-11-25 ENCOUNTER — Ambulatory Visit (INDEPENDENT_AMBULATORY_CARE_PROVIDER_SITE_OTHER): Payer: BLUE CROSS/BLUE SHIELD | Admitting: Neurology

## 2014-11-25 VITALS — BP 137/92 | HR 93 | Ht 62.0 in | Wt 167.8 lb

## 2014-11-25 DIAGNOSIS — I6521 Occlusion and stenosis of right carotid artery: Secondary | ICD-10-CM

## 2014-11-25 NOTE — Patient Instructions (Signed)
I had a long d/w patient and her friend Manuela Schwartz about her recent stroke, risk for recurrent stroke/TIAs, personally independently reviewed imaging studies and stroke evaluation results and answered questions.Continue aspirin 81 mg orally every day  for secondary stroke prevention and maintain strict control of hypertension with blood pressure goal below 130/90, diabetes with hemoglobin A1c goal below 6.5% and lipids with LDL cholesterol goal below 70 mg/dL. I also advised the patient to eat a healthy diet with plenty of whole grains, cereals, fruits and vegetables, exercise regularly and maintain ideal body weight . I encouraged the patient to do home hand fine motor exercises to help with improvement as she cannot afford outpatient therapy. Consider possible participation in the [facility]ORE stroke rehabilitation trial if interested. Followup in the future with me in 6 months or call earlier if necessary. Stroke Prevention Some medical conditions and behaviors are associated with an increased chance of having a stroke. You may prevent a stroke by making healthy choices and managing medical conditions. HOW CAN I REDUCE MY RISK OF HAVING A STROKE?   Stay physically active. Get at least 30 minutes of activity on most or all days.  Do not smoke. It may also be helpful to avoid exposure to secondhand smoke.  Limit alcohol use. Moderate alcohol use is considered to be:  No more than 2 drinks per day for men.  No more than 1 drink per day for nonpregnant women.  Eat healthy foods. This involves:  Eating 5 or more servings of fruits and vegetables a day.  Making dietary changes that address high blood pressure (hypertension), high cholesterol, diabetes, or obesity.  Manage your cholesterol levels.  Making food choices that are high in fiber and low in saturated fat, trans fat, and cholesterol may control cholesterol levels.  Take any prescribed medicines to control cholesterol as directed by your health  care provider.  Manage your diabetes.  Controlling your carbohydrate and sugar intake is recommended to manage diabetes.  Take any prescribed medicines to control diabetes as directed by your health care provider.  Control your hypertension.  Making food choices that are low in salt (sodium), saturated fat, trans fat, and cholesterol is recommended to manage hypertension.  Take any prescribed medicines to control hypertension as directed by your health care provider.  Maintain a healthy weight.  Reducing calorie intake and making food choices that are low in sodium, saturated fat, trans fat, and cholesterol are recommended to manage weight.  Stop drug abuse.  Avoid taking birth control pills.  Talk to your health care provider about the risks of taking birth control pills if you are over 51 years old, smoke, get migraines, or have ever had a blood clot.  Get evaluated for sleep disorders (sleep apnea).  Talk to your health care provider about getting a sleep evaluation if you snore a lot or have excessive sleepiness.  Take medicines only as directed by your health care provider.  For some people, aspirin or blood thinners (anticoagulants) are helpful in reducing the risk of forming abnormal blood clots that can lead to stroke. If you have the irregular heart rhythm of atrial fibrillation, you should be on a blood thinner unless there is a good reason you cannot take them.  Understand all your medicine instructions.  Make sure that other conditions (such as anemia or atherosclerosis) are addressed. SEEK IMMEDIATE MEDICAL CARE IF:   You have sudden weakness or numbness of the face, arm, or leg, especially on one side of the  body.  Your face or eyelid droops to one side.  You have sudden confusion.  You have trouble speaking (aphasia) or understanding.  You have sudden trouble seeing in one or both eyes.  You have sudden trouble walking.  You have dizziness.  You have  a loss of balance or coordination.  You have a sudden, severe headache with no known cause.  You have new chest pain or an irregular heartbeat. Any of these symptoms may represent a serious problem that is an emergency. Do not wait to see if the symptoms will go away. Get medical help at once. Call your local emergency services (911 in [place] Do not drive yourself to the hospital. Document Released: 04/21/2004 Document Revised: 07/29/2013 Document Reviewed: 09/14/2012 [facility] Patient Information 2015 [place], [place]. This information is not intended to replace advice given to you by your health care provider. Make sure you discuss any questions you have with your health care provider.

## 2014-11-25 NOTE — Progress Notes (Signed)
[facility] [address] [place]. [place] [address] [phone]       OFFICE FOLLOW-UP NOTE  Carla. Carla Hopkins Date of Birth:  03-29-1950 Medical Record Number:  [id number]   HPI: Carla Hopkins is a 64 year Caucasian lady seen today for first office follow-up visit for hospital admission for stroke in April 2016.Theresa Wedel is an 64 y.o. female with a history of hypertension and hypothyroidism resenting with new onset weakness involving left side. Patient was last known well at bedtime last night at 10:30 PM. On 07/23/14 She fell when she attempted to get out of bed this morning and was unable to get up from the floor. She had not been on antiplatelet therapy. Is no previous history of stroke nor TIA. Initial laboratory studies showed an elevation in troponin of 1.8.  . NIH stroke score was 10. LSN: 10:30 PM on 07/23/2014 tPA Given: No: Beyond time under for treatmenconsideration MRI scan of the brain showed multifocal patchy acute right MCA territory infarcts with additional 8 mm cortical infarct in the parasagittal right frontal cortical gray matter. MRA of the brain showed diminished flow throughout the visualized right ICA and MCA suggestive of critical proximal stenosis. Carotid ultrasound confirmed this showing greater than 80% proximal right ICA stenosis. Transthoracic echocardiogram was normal. Hemoglobin A1c was elevated at 8.5. LDL cholesterol was elevated at 240, total cholesterol 317, triglycerides 154 and HDL 46 mg percent. Vascular surgery was consulted and patient was initially sent to rehabilitation and subsequently underwent elective right carotid endarterectomy on 08/05/14 successfully by Dr. Juanda Crumble feels. She initially had left hemiparesis as well as neglect and extinction but she made gradual improvement. She's had prolonged outpatient follow-up with therapy and is currently doing well. She still has some significant left hand weakness and involving grip and  intrinsic hand muscles. She is finished physical therapy and at home but cannot afford outpatient therapy as she has lost her health insurance. She can walk independently including stairs and walks a quarter mile every day. She had recent lab work done a few weeks ago which showed significant improvement with hemoglobin A1c coming down to 5.7 and fasting sugars consistently ranging in the 90-100 range here she remains on aspirin which is tolerating well without side effects. She does complain of some pain and tightness when moving her left hand and admits she is not been consistently doing her hand exercises. ROS:   14 system review of systems is positive for  hand pain, hand weakness, decreased coordination and all other systems negative  PMH:  Past Medical History  Diagnosis Date  . Hypothyroidism   . Hypertension   . Stroke     Social History:  Social History   Social History  . Marital Status: Single    Spouse Name: N/A  . Number of Children: N/A  . Years of Education: N/A   Occupational History  . Not on file.   Social History Main Topics  . Smoking status: Former Smoker -- 20.00 packs/day    Types: Cigarettes  . Smokeless tobacco: Not on file     Comment: quit smoking July 24, 2014  . Alcohol Use: 0.6 oz/week    0 Standard drinks or equivalent, 1 Glasses of wine per week     Comment: ocassionally - wine  . Drug Use: No  . Sexual Activity: Not Currently   Other Topics Concern  . Not on file   Social History Narrative    Medications:   Current Outpatient  Prescriptions on File Prior to Visit  Medication Sig Dispense Refill  . aspirin 81 MG chewable tablet Chew 1 tablet (81 mg total) by mouth daily.    . blood glucose meter kit and supplies KIT Test blood sugar once daily. Dx code: E11.9 1 each 0  . blood glucose meter kit and supplies KIT Dispense based on patient and insurance preference. Use up to four times daily as directed. (FOR ICD-9 250.00, 250.01). 1 each 0    . ergocalciferol (DRISDOL) 50000 UNITS capsule Take 1 capsule (50,000 Units total) by mouth once a week. 12 capsule 1  . glucose blood test strip Test blood sugar twice daily. Dx code: E11.9 100 each 3  . Lancets MISC Test blood sugar once daily. Dx code: E11.9 100 each 3  . levothyroxine (SYNTHROID, LEVOTHROID) 88 MCG tablet Take 1 tablet (88 mcg total) by mouth daily. 90 tablet 1  . lisinopril (PRINIVIL,ZESTRIL) 5 MG tablet Take 1 tablet (5 mg total) by mouth daily. 30 tablet 1  . metFORMIN (GLUCOPHAGE) 500 MG tablet Take 1 tablet (500 mg total) by mouth 2 (two) times daily with a meal. 60 tablet 1   No current facility-administered medications on file prior to visit.    Allergies:  No Known Allergies  Physical Exam General: well developed, well nourishe pleasant middle-age Caucasian lady, seated, in no evident distress Head: head normocephalic and atraumatic.  Neck: supple with no carotid or supraclavicular bruits Cardiovascular: regular rate and rhythm, no murmurs Musculoskeletal: no deformity Skin:  no rash/petichiae Vascular:  Normal pulses all extremities Filed Vitals:   11/25/14 1255  BP: 137/92  Pulse: 93   Neurologic Exam Mental Status: Awake and fully alert. Oriented to place and time. Recent and remote memory intact. Attention span, concentration and fund of knowledge appropriate. Mood and affect appropriate.  Cranial Nerves: Fundoscopic exam reveals sharp disc margins. Pupils equal, briskly reactive to light. Extraocular movements full without nystagmus. Visual fields full to confrontation. Hearing intact. Facial sensation intact. Face, tongue, palate moves normally and symmetrically.  Motor: Normal bulk and tone. Normal strength in all tested extremity muscles except weakness of left grip, intrinsic hand muscles and wrist extension. 4/5 strength at the left shoulder and elbows.. Sensory.: intact to touch ,pinprick .position and vibratory sensation.  Coordination: Rapid  alternating movements normal in all extremities. Finger-to-nose and heel-to-shin performed accurately on the right side. and slightly impaired in the left upper extremity. Gait and Station: Arises from chair without difficulty. Stance is normal. Gait demonstrates normal stride length and balance . Able to heel, toe and tandem walk without difficulty.  Reflexes: 1+ and symmetric. Toes downgoing.   NIHSS  1 Modified Rankin  2   ASSESSMENT: 68 year Caucasian lady with a right middle cerebral artery branch infarct in April 2016 secondary to thromboembolism from proximal high-grade right internal carotid artery stenosis for which she subsequently undergone carotid endarterectomy in May 2016 and is currently doing quite well except for left hand persistent weakness and mild spasticity. Vascular risk factors of diabetes, hypertension, hyperlipidemia and extracranial carotid disease    PLAN: I had a long d/w patient and her friend Carla Hopkins about her recent stroke, risk for recurrent stroke/TIAs, personally independently reviewed imaging studies and stroke evaluation results and answered questions.Continue aspirin 81 mg orally every day  for secondary stroke prevention and maintain strict control of hypertension with blood pressure goal below 130/90, diabetes with hemoglobin A1c goal below 6.5% and lipids with LDL cholesterol goal below 70 mg/dL.  I also advised the patient to eat a healthy diet with plenty of whole grains, cereals, fruits and vegetables, exercise regularly and maintain ideal body weight . I encouraged the patient to do home hand fine motor exercises to help with improvement as she cannot afford outpatient therapy. Consider possible participation in the [facility] stroke rehabilitation trial if interested. Greater than 50% of time during this 25 minute visit was spent on counseling, discussion with patient and family and coordination of careFollowup in the future with me in 6 months or call earlier if  necessary.  Antony Contras, MD Note: This document was prepared with digital dictation and possible smart phrase technology. Any transcriptional errors that result from this process are unintentional

## 2014-12-03 LAB — HM DIABETES EYE EXAM

## 2014-12-10 ENCOUNTER — Other Ambulatory Visit: Payer: Self-pay | Admitting: Physician Assistant

## 2014-12-11 NOTE — Telephone Encounter (Signed)
Pt is completely out of her

## 2014-12-19 ENCOUNTER — Other Ambulatory Visit: Payer: Self-pay | Admitting: Physician Assistant

## 2014-12-19 ENCOUNTER — Encounter: Payer: Self-pay | Admitting: Family Medicine

## 2014-12-20 ENCOUNTER — Other Ambulatory Visit: Payer: Self-pay | Admitting: Physician Assistant

## 2014-12-20 ENCOUNTER — Telehealth: Payer: Self-pay

## 2014-12-20 NOTE — Telephone Encounter (Addendum)
Patient called regarding her metformin - advised her an OV is needed for future refills, meds at pharmacy

## 2014-12-24 ENCOUNTER — Telehealth: Payer: Self-pay | Admitting: Family Medicine

## 2014-12-24 NOTE — Telephone Encounter (Signed)
Spoke with patient about her mammogram.  She does not have insurance at this time but she did need an appointment so we scheduled her with [name] on 10/3 at 1 pm.   i told her to speak with [name] at that time about programs to help with mammogram.s

## 2014-12-25 NOTE — Telephone Encounter (Signed)
DONE

## 2014-12-29 ENCOUNTER — Encounter: Payer: Self-pay | Admitting: Physician Assistant

## 2014-12-29 ENCOUNTER — Ambulatory Visit (INDEPENDENT_AMBULATORY_CARE_PROVIDER_SITE_OTHER): Payer: BLUE CROSS/BLUE SHIELD | Admitting: Family Medicine

## 2014-12-29 VITALS — BP 134/96 | HR 91 | Temp 98.1°F | Resp 16 | Ht 61.5 in | Wt 154.8 lb

## 2014-12-29 DIAGNOSIS — E034 Atrophy of thyroid (acquired): Secondary | ICD-10-CM | POA: Diagnosis not present

## 2014-12-29 DIAGNOSIS — E785 Hyperlipidemia, unspecified: Secondary | ICD-10-CM | POA: Diagnosis not present

## 2014-12-29 DIAGNOSIS — I6930 Unspecified sequelae of cerebral infarction: Secondary | ICD-10-CM

## 2014-12-29 DIAGNOSIS — E038 Other specified hypothyroidism: Secondary | ICD-10-CM | POA: Diagnosis not present

## 2014-12-29 DIAGNOSIS — E119 Type 2 diabetes mellitus without complications: Secondary | ICD-10-CM | POA: Diagnosis not present

## 2014-12-29 NOTE — Patient Instructions (Signed)
Please continue the metformin as prescribed.   You will come in in 1 month for the lab work including lipid panel and a1c.  I will contact you with the results. I will consult with cardiology, and contact you with my findings. You are doing an awesome job, with exercise and food restrictions.  Keep up the solid work.

## 2014-12-29 NOTE — Progress Notes (Signed)
[facility] [address], [place] [address] [phone]  Date:  12/29/2014   Name:  Carla Hopkins   DOB:  October 07, 1950   MRN:  [id number]  PCP:  McVeigh,Todd, PA    History of Present Illness:  Carla Hopkins is a 64 y.o. female patient who presents to [facility] for follow up of dm2, hyperlipidemia, and hyperthyroidism. DM2: Doing a morning fasting glucose which ranges 92-104 that she fits in a calendar.  Compliant on the metformin, bid.  She is walking 1/4 mile every day which takes about 10 minutes.  She has diligently restricted herself from fried foods, and sweets.  5 months ago, CVA likely due from a carotid blockage which has carotid enocardectomy.  No polyuria, n/v/d, fatigue, or tremulousness.   She has no new numbness or tingling, no cp, palpitations, leg swelling, vision changes, or sob. She currently is out of  -ate this morning. -No vision changes. Thyroid normal without nausea, cp, palpitations, skin changes, fatigue, diarrhea, or constipation, or anxiety.  Patient Active Problem List   Diagnosis Date Noted  . Left hemiparesis ([place]) 10/13/2014  . Left-sided neglect 10/13/2014  . History of right MCA stroke 08/06/2014  . Carotid stenosis 08/05/2014  . Acute right MCA stroke ([place]) 07/30/2014  . Cerebral infarction due to thrombosis of right middle cerebral artery ([place])   . Thyroid activity decreased   . Left-sided weakness   . Stroke ([place])   . Essential hypertension   . Carotid artery stenosis   . CVA (cerebral infarction) 07/24/2014  . Hyperlipidemia 07/24/2014  . Diabetes ([place]) 07/24/2014  . Elevated troponin 07/24/2014  . Tobacco abuse 07/24/2014  . Benign essential HTN 07/23/2014  . Obstructive sleep apnea 07/23/2014  . Hypothyroidism 07/03/2014  . Type II or unspecified type diabetes mellitus without mention of complication, not stated as uncontrolled 09/25/2013  . BMI 38.0-38.9,adult 05/19/2013  . Nicotine addiction 05/19/2013  . Right  atrial enlargement 05/19/2013    Past Medical History  Diagnosis Date  . Hypothyroidism   . Hypertension   . Stroke [facility])     Past Surgical History  Procedure Laterality Date  . Tubal ligation    . Endarterectomy Right 08/05/2014    Procedure: Right Carotid ENDARTERECTOMY ;  Surgeon: Elam Dutch, MD;  Location: [facility];  Service: Vascular;  Laterality: Right;  . Patch angioplasty Right 08/05/2014    Procedure: PATCH ANGIOPLASTY;  Surgeon: Elam Dutch, MD;  Location: [facility] OR;  Service: Vascular;  Laterality: Right;    Social History  Substance Use Topics  . Smoking status: Former Smoker -- 20.00 packs/day    Types: Cigarettes  . Smokeless tobacco: None     Comment: quit smoking July 24, 2014  . Alcohol Use: 0.6 oz/week    0 Standard drinks or equivalent, 1 Glasses of wine per week     Comment: ocassionally - wine    Family History  Problem Relation Age of Onset  . Adopted: Yes    No Known Allergies  Medication list has been reviewed and updated.  Current Outpatient Prescriptions on File Prior to Visit  Medication Sig Dispense Refill  . aspirin 81 MG chewable tablet Chew 1 tablet (81 mg total) by mouth daily.    Marland Kitchen atorvastatin (LIPITOR) 80 MG tablet Take 1 tablet (80 mg total) by mouth daily. PATIENT NEEDS OFFICE VISIT/LABS FOR ADDITIONAL REFILLS 30 tablet 0  . blood glucose meter kit and supplies KIT Test blood sugar once daily. Dx  code: E11.9 1 each 0  . blood glucose meter kit and supplies KIT Dispense based on patient and insurance preference. Use up to four times daily as directed. (FOR ICD-9 250.00, 250.01). 1 each 0  . ergocalciferol (DRISDOL) 50000 UNITS capsule Take 1 capsule (50,000 Units total) by mouth once a week. 12 capsule 1  . glucose blood test strip Test blood sugar twice daily. Dx code: E11.9 100 each 3  . Lancets MISC Test blood sugar once daily. Dx code: E11.9 100 each 3  . levothyroxine (SYNTHROID, LEVOTHROID) 88 MCG tablet Take 1 tablet (88  mcg total) by mouth daily. 90 tablet 1  . lisinopril (PRINIVIL,ZESTRIL) 5 MG tablet Take 1 tablet (5 mg total) by mouth daily. 30 tablet 1  . metFORMIN (GLUCOPHAGE) 500 MG tablet TAKE ONE TABLET BY MOUTH TWICE DAILY WITH MEALS.  "OV NEEDED" 60 tablet 0   No current facility-administered medications on file prior to visit.    ROS ROS otherwise unremarkable unless listed above.  Physical Examination: BP 134/96 mmHg  Pulse 91  Temp(Src) 98.1 F (36.7 C) (Oral)  Resp 16  Ht 5' 1.5" (1.562 m)  Wt 154 lb 12.8 oz (70.217 kg)  BMI 28.78 kg/m2  SpO2 97% Ideal Body Weight: Weight in (lb) to have BMI = 25: 134.2 Wt Readings from Last 3 Encounters:  12/29/14 154 lb 12.8 oz (70.217 kg)  11/25/14 167 lb 12.8 oz (76.114 kg)  09/30/14 183 lb 12.8 oz (83.371 kg)    Physical Exam  Constitutional: She is oriented to person, place, and time. She appears well-developed and well-nourished. No distress.  HENT:  Head: Normocephalic and atraumatic.  Right Ear: External ear normal.  Left Ear: External ear normal.  Eyes: Conjunctivae and EOM are normal. Pupils are equal, round, and reactive to light.  Neck: Normal range of motion. No thyroid mass and no thyromegaly present.  Cardiovascular: Normal rate and regular rhythm.  Exam reveals no gallop and no friction rub.   Murmur heard.  Systolic murmur is present with a grade of 2/6  Pulses:      Carotid pulses are 2+ on the right side, and 2+ on the left side.      Radial pulses are 2+ on the right side, and 2+ on the left side.       Dorsalis pedis pulses are 2+ on the right side, and 2+ on the left side.  Pulmonary/Chest: Effort normal. No respiratory distress. She has no decreased breath sounds. She has no wheezes. She has no rhonchi.  Musculoskeletal:  Left extremity weakness consistent with prior cva.  Neurological: She is alert and oriented to person, place, and time.  Skin: Skin is warm and dry. She is not diaphoretic.  Dry lower extremities.   Psychiatric: She has a normal mood and affect. Her behavior is normal.     Assessment and Plan: 64 year old female is here today for medication refill.   Intentional weight loss is present.  She is attempting to lose weight and exercise which is improving a1c.  We will recheck this in 1 month to complete a 3 month follow up of this a1c.  There, the other labs will be received as well. I have reviewed prior notes with one note, revealing this systolic 2/6 murmur, from Dr. Burt Knack of heartcare, so I am confident that this is not a new finding.   We will decide of follow up time pending labs. At this time, she is declining colonscopy, mammogram, and  other labs due to cost.  I have given her a scholarship form for mammogram.   Declines flu vaccine at this time.  Declines tdap, and shingles vaccine She declines pap at this time. If these are within normal range, continue medications with 6 month refill and follow.   Type 2 diabetes mellitus without complication, without long-term current use of insulin ([place]) - Plan: HM Diabetes Foot Exam, Hemoglobin A1c, Lipid panel, CANCELED: Lipid panel  Hyperlipidemia - Plan: HM Diabetes Foot Exam, Lipid panel, CANCELED: Lipid panel  Hypothyroidism due to acquired atrophy of thyroid - Plan: TSH  Ivar Drape, PA-C [facility] 10/3/20169:36 PM    Ivar Drape, PA-C [facility] 12/29/2014 1:25 PM

## 2014-12-30 DIAGNOSIS — I6930 Unspecified sequelae of cerebral infarction: Secondary | ICD-10-CM | POA: Insufficient documentation

## 2014-12-30 NOTE — Progress Notes (Signed)
History and physical examinations reviewed in detail with PA English. Pt denies DOE, palpitations, SOB, leg swelling.   II-III/VI systolic murmur that radiates into B carotids appreciated.  2D-echo from 07/25/14 reviewed in detail.  A/P: Heart murmur: New to this provider; s/p recent echo and cardiology evaluation during recent admission; heart murmur appreciated by Dr. Copper on 07/25/14 consultation.  Currently asymptomatic.   Paislei Dorval Elayne Guerin, M.D. [facility] [address] [place],   [address] [phone] phone [phone] fax

## 2015-01-06 ENCOUNTER — Other Ambulatory Visit: Payer: Self-pay | Admitting: Physician Assistant

## 2015-01-19 ENCOUNTER — Other Ambulatory Visit: Payer: Self-pay | Admitting: Physician Assistant

## 2015-01-30 ENCOUNTER — Other Ambulatory Visit (INDEPENDENT_AMBULATORY_CARE_PROVIDER_SITE_OTHER): Payer: BLUE CROSS/BLUE SHIELD

## 2015-01-30 DIAGNOSIS — E034 Atrophy of thyroid (acquired): Secondary | ICD-10-CM

## 2015-01-30 DIAGNOSIS — E119 Type 2 diabetes mellitus without complications: Secondary | ICD-10-CM | POA: Diagnosis not present

## 2015-01-30 DIAGNOSIS — E785 Hyperlipidemia, unspecified: Secondary | ICD-10-CM

## 2015-01-30 LAB — LIPID PANEL
CHOLESTEROL: 146 mg/dL (ref 125–200)
HDL: 40 mg/dL — ABNORMAL LOW (ref >=46)
LDL Cholesterol: 80 mg/dL (ref <130)
Total CHOL/HDL Ratio: 3.7 Ratio (ref <=5.0)
Triglycerides: 128 mg/dL (ref <150)
VLDL: 26 mg/dL (ref <30)

## 2015-01-30 LAB — HEMOGLOBIN A1C
Hgb A1c MFr Bld: 6.1 % — ABNORMAL HIGH (ref <5.7)
MEAN PLASMA GLUCOSE: 128 mg/dL — AB (ref <117)

## 2015-01-30 LAB — TSH: TSH: 0.711 u[IU]/mL (ref 0.350–4.500)

## 2015-02-24 ENCOUNTER — Encounter: Payer: Self-pay | Admitting: Vascular Surgery

## 2015-02-25 DIAGNOSIS — Z0271 Encounter for disability determination: Secondary | ICD-10-CM

## 2015-02-26 ENCOUNTER — Encounter: Payer: Self-pay | Admitting: Vascular Surgery

## 2015-02-26 ENCOUNTER — Ambulatory Visit (INDEPENDENT_AMBULATORY_CARE_PROVIDER_SITE_OTHER): Payer: Medicare Other | Admitting: Vascular Surgery

## 2015-02-26 ENCOUNTER — Ambulatory Visit (HOSPITAL_COMMUNITY)
Admission: RE | Admit: 2015-02-26 | Discharge: 2015-02-26 | Disposition: A | Discharge status: Home / Self Care | Payer: Medicare Other | Source: Ambulatory Visit | Attending: Vascular Surgery | Admitting: Vascular Surgery

## 2015-02-26 VITALS — BP 144/80 | HR 74 | Temp 97.9°F | Resp 16 | Ht 62.5 in | Wt 148.0 lb

## 2015-02-26 DIAGNOSIS — I6523 Occlusion and stenosis of bilateral carotid arteries: Secondary | ICD-10-CM

## 2015-02-26 DIAGNOSIS — I10 Essential (primary) hypertension: Secondary | ICD-10-CM | POA: Insufficient documentation

## 2015-02-26 DIAGNOSIS — Z8673 Personal history of transient ischemic attack (TIA), and cerebral infarction without residual deficits: Secondary | ICD-10-CM | POA: Diagnosis not present

## 2015-02-26 DIAGNOSIS — I639 Cerebral infarction, unspecified: Secondary | ICD-10-CM | POA: Diagnosis not present

## 2015-02-26 DIAGNOSIS — Z0271 Encounter for disability determination: Secondary | ICD-10-CM

## 2015-02-26 NOTE — Progress Notes (Signed)
Patient is a 64 year old female who returns for follow-up today. She previously had a stroke to the right brain in April 2016. She subsequently underwent right carotid endarterectomy in May 2016. She has had no further events since then. She currently is on aspirin. She still has some residual left upper extremity weakness. She is also followed by Dr. Leonie Man with neurology. Other chronic medical problem hypertension which is currently stable.  Review of systems: She denies shortness of breath. She denies chest pain.  Past Medical History  Diagnosis Date  . Hypothyroidism   . Hypertension   . Stroke [facility])      Current Outpatient Prescriptions on File Prior to Visit  Medication Sig Dispense Refill  . aspirin 81 MG chewable tablet Chew 1 tablet (81 mg total) by mouth daily.    Marland Kitchen atorvastatin (LIPITOR) 80 MG tablet TAKE ONE TABLET BY MOUTH ONCE DAILY. 30 tablet 2  . blood glucose meter kit and supplies KIT Test blood sugar once daily. Dx code: E11.9 1 each 0  . blood glucose meter kit and supplies KIT Dispense based on patient and insurance preference. Use up to four times daily as directed. (FOR ICD-9 250.00, 250.01). 1 each 0  . ergocalciferol (DRISDOL) 50000 UNITS capsule Take 1 capsule (50,000 Units total) by mouth once a week. 12 capsule 1  . glucose blood test strip Test blood sugar twice daily. Dx code: E11.9 100 each 3  . Lancets MISC Test blood sugar once daily. Dx code: E11.9 100 each 3  . levothyroxine (SYNTHROID, LEVOTHROID) 88 MCG tablet Take 1 tablet (88 mcg total) by mouth daily. 90 tablet 1  . lisinopril (PRINIVIL,ZESTRIL) 5 MG tablet Take 1 tablet (5 mg total) by mouth daily. 30 tablet 1  . metFORMIN (GLUCOPHAGE) 500 MG tablet TAKE ONE TABLET BY MOUTH TWICE DAILY WITH MEALS 60 tablet 1   No current facility-administered medications on file prior to visit.    Physical exam:  Filed Vitals:   02/26/15 1223 02/26/15 1226  BP: 132/78 144/80  Pulse: 70 74  Temp: 97.9 F (36.6  C)   TempSrc: Oral   Resp: 16   Height: 5' 2.5" (1.588 m)   Weight: 148 lb (67.132 kg)   SpO2: 98%     Neck: No carotid bruits  neuro: Left upper extremity and left hand intrinsics all 4 over 5 compared to right  Data: Patient had a carotid duplex exam today which showed widely patent right carotid endarterectomy less than 40% left internal carotid artery stenosis  Assessment: Doing well status post carotid endarterectomy still some left upper extremity deficit from her preoperative stroke.  Plan: Continue Plavix continue risk factor modification. Patient will follow-up in 6 months with repeat carotid duplex scan  Ruta Hinds, MD [facility]: [phone] Pager: [phone]

## 2015-02-26 NOTE — Addendum Note (Signed)
Addended by: Dorthula Rue L on: 02/26/2015 01:32 PM   Modules accepted: Orders

## 2015-03-16 ENCOUNTER — Other Ambulatory Visit: Payer: Self-pay | Admitting: Physician Assistant

## 2015-04-09 ENCOUNTER — Other Ambulatory Visit: Payer: Self-pay | Admitting: Physician Assistant

## 2015-04-20 ENCOUNTER — Other Ambulatory Visit: Payer: Self-pay | Admitting: Physician Assistant

## 2015-05-28 ENCOUNTER — Encounter: Payer: Self-pay | Admitting: Neurology

## 2015-05-28 ENCOUNTER — Ambulatory Visit (INDEPENDENT_AMBULATORY_CARE_PROVIDER_SITE_OTHER): Payer: Commercial Managed Care - HMO | Admitting: Neurology

## 2015-05-28 VITALS — BP 139/82 | HR 79 | Ht 62.5 in | Wt 143.8 lb

## 2015-05-28 DIAGNOSIS — I6990 Unspecified sequelae of unspecified cerebrovascular disease: Secondary | ICD-10-CM | POA: Diagnosis not present

## 2015-05-28 NOTE — Progress Notes (Signed)
[facility] [address] [place]. [place] [address] [phone]       OFFICE FOLLOW-UP NOTE  Carla. Carla Hopkins Date of Birth:  11-28-1950 Medical Record Number:  [id number]   HPI: Carla Hopkins is a 20 year Caucasian lady seen today for first office follow-up visit for hospital admission for stroke in April 2016.Carla Hopkins is an 65 y.o. female with a history of hypertension and hypothyroidism resenting with new onset weakness involving left side. Patient was last known well at bedtime last night at 10:30 PM. On 07/23/14 She fell when she attempted to get out of bed this morning and was unable to get up from the floor. She had not been on antiplatelet therapy. Is no previous history of stroke nor TIA. Initial laboratory studies showed an elevation in troponin of 1.8.  . NIH stroke score was 10. LSN: 10:30 PM on 07/23/2014 tPA Given: No: Beyond time under for treatmenconsideration MRI scan of the brain showed multifocal patchy acute right MCA territory infarcts with additional 8 mm cortical infarct in the parasagittal right frontal cortical gray matter. MRA of the brain showed diminished flow throughout the visualized right ICA and MCA suggestive of critical proximal stenosis. Carotid ultrasound confirmed this showing greater than 80% proximal right ICA stenosis. Transthoracic echocardiogram was normal. Hemoglobin A1c was elevated at 8.5. LDL cholesterol was elevated at 240, total cholesterol 317, triglycerides 154 and HDL 46 mg percent. Vascular surgery was consulted and patient was initially sent to rehabilitation and subsequently underwent elective right carotid endarterectomy on 08/05/14 successfully by Dr. Juanda Crumble feels. She initially had left hemiparesis as well as neglect and extinction but she made gradual improvement. She's had prolonged outpatient follow-up with therapy and is currently doing well. She still has some significant left hand weakness and involving grip and  intrinsic hand muscles. She is finished physical therapy and at home but cannot afford outpatient therapy as she has lost her health insurance. She can walk independently including stairs and walks a quarter mile every day. She had recent lab work done a few weeks ago which showed significant improvement with hemoglobin A1c coming down to 5.7 and fasting sugars consistently ranging in the 90-100 range here she remains on aspirin which is tolerating well without side effects. She does complain of some pain and tightness when moving her left hand and admits she is not been consistently doing her hand exercises Update 05/28/2015 :. She returns for follow-up after last visit 6 months ago. She continues to do well without recurrent stroke or TIA symptoms. She is tolerating aspirin without significant bleeding but occasional bruising episodes. She states her blood pressure is well controlled today it is 139/82. She had lipid profile checked 3 months ago which was fine and hemoglobin A1c was 6.1. She is quite active and doesn't exercise bike 4 miles per day. She has had regular follow-ups with a vascular surgeon Dr. Oneida Alar and carotid ultrasound done last year was fine. She has no new complaints today but does continue to have persistent weakness and decreased coordination in the left hand ROS:   14 system review of systems is positive for  hand pain, hand weakness, decreased coordination, environmental allergies, bruising and all other systems negative  PMH:  Past Medical History  Diagnosis Date  . Hypothyroidism   . Hypertension   . Stroke [facility])     Social History:  Social History   Social History  . Marital Status: Single    Spouse Name: N/A  .  Number of Children: N/A  . Years of Education: N/A   Occupational History  . Not on file.   Social History Main Topics  . Smoking status: Former Smoker -- 20.00 packs/day    Types: Cigarettes  . Smokeless tobacco: Not on file     Comment: quit smoking  July 24, 2014  . Alcohol Use: 0.6 oz/week    0 Standard drinks or equivalent, 1 Glasses of wine per week     Comment: ocassionally - wine  . Drug Use: No  . Sexual Activity: Not Currently   Other Topics Concern  . Not on file   Social History Narrative    Medications:   Current Outpatient Prescriptions on File Prior to Visit  Medication Sig Dispense Refill  . aspirin 81 MG chewable tablet Chew 1 tablet (81 mg total) by mouth daily.    Marland Kitchen atorvastatin (LIPITOR) 80 MG tablet TAKE ONE TABLET BY MOUTH ONCE DAILY 30 tablet 3  . blood glucose meter kit and supplies KIT Test blood sugar once daily. Dx code: E11.9 1 each 0  . blood glucose meter kit and supplies KIT Dispense based on patient and insurance preference. Use up to four times daily as directed. (FOR ICD-9 250.00, 250.01). 1 each 0  . ergocalciferol (DRISDOL) 50000 UNITS capsule Take 1 capsule (50,000 Units total) by mouth once a week. 12 capsule 1  . glucose blood test strip Test blood sugar twice daily. Dx code: E11.9 100 each 3  . Lancets MISC Test blood sugar once daily. Dx code: E11.9 100 each 3  . levothyroxine (SYNTHROID, LEVOTHROID) 88 MCG tablet Take 1 tablet (88 mcg total) by mouth daily. 90 tablet 1  . lisinopril (PRINIVIL,ZESTRIL) 5 MG tablet Take 1 tablet (5 mg total) by mouth daily. 30 tablet 1  . metFORMIN (GLUCOPHAGE) 500 MG tablet TAKE ONE TABLET BY MOUTH TWICE DAILY WITH MEALS. PATIENT NEEDS AN APPOINTMENT 60 tablet 0   No current facility-administered medications on file prior to visit.    Allergies:  No Known Allergies  Physical Exam General: well developed, well nourished pleasant middle-age Caucasian lady, seated, in no evident distress Head: head normocephalic and atraumatic.  Neck: supple with no carotid or supraclavicular bruits Cardiovascular: regular rate and rhythm, no murmurs Musculoskeletal: no deformity Skin:  no rash/petichiae Vascular:  Normal pulses all extremities Filed Vitals:   05/28/15  1347  BP: 139/82  Pulse: 79   Neurologic Exam Mental Status: Awake and fully alert. Oriented to place and time. Recent and remote memory intact. Attention span, concentration and fund of knowledge appropriate. Mood and affect appropriate.  Cranial Nerves: Fundoscopic exam Not done . Pupils equal, briskly reactive to light. Extraocular movements full without nystagmus. Visual fields full to confrontation. Hearing intact. Facial sensation intact. Face, tongue, palate moves normally and symmetrically.  Motor: Normal bulk and tone. Normal strength in all tested extremity muscles except weakness of left grip, intrinsic hand muscles and wrist extension. 4/5 strength at the left shoulder and elbows.. Sensory.: intact to touch ,pinprick .position and vibratory sensation.  Coordination: Rapid alternating movements normal in all extremities. Finger-to-nose and heel-to-shin performed accurately on the right side. and slightly impaired in the left upper extremity. Gait and Station: Arises from chair without difficulty. Stance is normal. Gait demonstrates normal stride length and balance . Able to heel, toe and tandem walk without difficulty.  Reflexes: 1+ and symmetric. Toes downgoing.       ASSESSMENT: 78 year Caucasian lady with a right middle cerebral  artery branch infarct in April 2016 secondary to thromboembolism from proximal high-grade right internal carotid artery stenosis for which she subsequently undergone carotid endarterectomy in May 2016 and is currently doing quite well except for left hand persistent weakness and mild spasticity. Vascular risk factors of diabetes, hypertension, hyperlipidemia and extracranial carotid disease    PLAN: II had a long d/w patient about her remote stroke, risk for recurrent stroke/TIAs, personally independently reviewed imaging studies and stroke evaluation results and answered questions.Continue aspirin 81 mg daily  for secondary stroke prevention and maintain  strict control of hypertension with blood pressure goal below 130/90, diabetes with hemoglobin A1c goal below 6.5% and lipids with LDL cholesterol goal below 70 mg/dL. I also advised the patient to eat a healthy diet with plenty of whole grains, cereals, fruits and vegetables, exercise regularly and maintain ideal body weight Followup in the future with me in  1 year or call earlier if necessary   Antony Contras, MD Note: This document was prepared with digital dictation and possible smart phrase technology. Any transcriptional errors that result from this process are unintentional

## 2015-05-28 NOTE — Patient Instructions (Signed)
I had a long d/w patient about her remote stroke, risk for recurrent stroke/TIAs, personally independently reviewed imaging studies and stroke evaluation results and answered questions.Continue aspirin 81 mg daily  for secondary stroke prevention and maintain strict control of hypertension with blood pressure goal below 130/90, diabetes with hemoglobin A1c goal below 6.5% and lipids with LDL cholesterol goal below 70 mg/dL. I also advised the patient to eat a healthy diet with plenty of whole grains, cereals, fruits and vegetables, exercise regularly and maintain ideal body weight Followup in the future with me in  1 year or call earlier if necessary

## 2015-07-26 ENCOUNTER — Other Ambulatory Visit: Payer: Self-pay | Admitting: Family Medicine

## 2015-08-06 ENCOUNTER — Telehealth: Payer: Self-pay

## 2015-08-06 NOTE — Telephone Encounter (Signed)
[employer] is sending over a request for mail order prescriptions.  Change of pharmacy per patient.

## 2015-08-07 MED ORDER — LEVOTHYROXINE SODIUM 88 MCG PO TABS: 88.0000 ug | ORAL_TABLET | Freq: Every day | ORAL | Status: DC

## 2015-08-07 NOTE — Telephone Encounter (Signed)
Changed pharmacy in system.

## 2015-08-07 NOTE — Telephone Encounter (Signed)
Spoke to pt to advise she is due for follow up and asked if she can get in to see [place]. She agreed to come in next week to 102. She can wait on all of her meds until then, except she has been out of levothyroxine for a few weeks. I will send in 30 day locally so she can start back on it today in order for labs to be more accurate.

## 2015-08-07 NOTE — Addendum Note (Signed)
Addended by: Elwyn Reach A on: 08/07/2015 10:28 AM   Modules accepted: Orders

## 2015-08-11 ENCOUNTER — Ambulatory Visit (INDEPENDENT_AMBULATORY_CARE_PROVIDER_SITE_OTHER): Payer: Commercial Managed Care - HMO | Admitting: Physician Assistant

## 2015-08-11 DIAGNOSIS — E039 Hypothyroidism, unspecified: Secondary | ICD-10-CM

## 2015-08-11 DIAGNOSIS — R05 Cough: Secondary | ICD-10-CM | POA: Diagnosis not present

## 2015-08-11 DIAGNOSIS — E785 Hyperlipidemia, unspecified: Secondary | ICD-10-CM

## 2015-08-11 DIAGNOSIS — R42 Dizziness and giddiness: Secondary | ICD-10-CM

## 2015-08-11 DIAGNOSIS — E559 Vitamin D deficiency, unspecified: Secondary | ICD-10-CM

## 2015-08-11 DIAGNOSIS — E119 Type 2 diabetes mellitus without complications: Secondary | ICD-10-CM

## 2015-08-11 DIAGNOSIS — I10 Essential (primary) hypertension: Secondary | ICD-10-CM | POA: Diagnosis not present

## 2015-08-11 LAB — HEMOGLOBIN A1C
Hgb A1c MFr Bld: 5.9 % — ABNORMAL HIGH (ref <5.7)
Mean Plasma Glucose: 123 mg/dL

## 2015-08-11 LAB — COMPLETE METABOLIC PANEL WITH GFR
ALBUMIN: 3.7 g/dL (ref 3.6–5.1)
ALT: 32 U/L — ABNORMAL HIGH (ref 6–29)
AST: 36 U/L — AB (ref 10–35)
Alkaline Phosphatase: 74 U/L (ref 33–130)
BILIRUBIN TOTAL: 0.5 mg/dL (ref 0.2–1.2)
BUN: 18 mg/dL (ref 7–25)
CHLORIDE: 102 mmol/L (ref 98–110)
CO2: 24 mmol/L (ref 20–31)
CREATININE: 1.03 mg/dL — AB (ref 0.50–0.99)
Calcium: 9.7 mg/dL (ref 8.6–10.4)
GFR, EST NON AFRICAN AMERICAN: 57 mL/min — AB (ref >=60)
GFR, Est African American: 66 mL/min (ref >=60)
Glucose, Bld: 74 mg/dL (ref 65–99)
POTASSIUM: 4.5 mmol/L (ref 3.5–5.3)
SODIUM: 139 mmol/L (ref 135–146)
Total Protein: 6.6 g/dL (ref 6.1–8.1)

## 2015-08-11 LAB — TSH: TSH: 20.38 mIU/L — ABNORMAL HIGH

## 2015-08-11 MED ORDER — METFORMIN HCL 500 MG PO TABS: 500.0000 mg | ORAL_TABLET | Freq: Two times a day (BID) | ORAL | Status: DC

## 2015-08-11 MED ORDER — ATORVASTATIN CALCIUM 80 MG PO TABS: 80.0000 mg | ORAL_TABLET | Freq: Every day | ORAL | Status: DC

## 2015-08-11 MED ORDER — LISINOPRIL 5 MG PO TABS: 5.0000 mg | ORAL_TABLET | Freq: Every day | ORAL | Status: DC

## 2015-08-11 MED ORDER — LEVOTHYROXINE SODIUM 88 MCG PO TABS: 88.0000 ug | ORAL_TABLET | Freq: Every day | ORAL | Status: DC

## 2015-08-11 NOTE — Progress Notes (Signed)
[facility] [address] [place] [address] [phone]  Date:  08/11/2015   Name:  Carla Hopkins   DOB:  Dec 27, 1950   MRN:  [id number]  PCP:  Ivar Drape, PA   Chief Complaint  Patient presents with  . Follow-up    OV with Dr Tamala Julian 12/29/2014 for Diabetes, patient declines all Health Updates  . Medication Refill    Vitamin D2    History of Present Illness:  Carla Hopkins is a 65 y.o. female patient who presents to [facility] here today for follow up of DM2 and thyroid disease.  She is also requesting medication refill.   DM2: she is not eating any sugar.  She limit carbs, but she has a bagel, and baked potato.  She drinks iced coffee and tea, without artificial sweeteners.  She is drinking water.  No chest pains, palpitations, sob, swelling, frequency, nausea, vision changes, tremulousness, or dizziness.   Exercising with exercise bike 3 miles per day.  She tries to get out     Thyroid:she has been outOf medication for 3 weeks. She generally has dry skin but she has noticed an increase in dry skin  Wt Readings from Last 3 Encounters:  08/11/15 147 lb 12.8 oz (67.042 kg)  05/28/15 143 lb 12.8 oz (65.227 kg)  02/26/15 148 lb (67.132 kg)     Patient Active Problem List   Diagnosis Date Noted  . History of CVA with residual deficit 12/30/2014  . Left hemiparesis ([place]) 10/13/2014  . Left-sided neglect 10/13/2014  . History of right MCA stroke 08/06/2014  . Carotid stenosis 08/05/2014  . Acute right MCA stroke ([place]) 07/30/2014  . Cerebral infarction due to thrombosis of right middle cerebral artery ([place])   . Thyroid activity decreased   . Left-sided weakness   . Stroke ([place])   . Essential hypertension   . Carotid artery stenosis   . CVA (cerebral infarction) 07/24/2014  . Hyperlipidemia 07/24/2014  . Diabetes ([place]) 07/24/2014  . Elevated troponin 07/24/2014  . Tobacco abuse 07/24/2014  . Benign essential HTN 07/23/2014  . Obstructive sleep  apnea 07/23/2014  . Hypothyroidism 07/03/2014  . Type II or unspecified type diabetes mellitus without mention of complication, not stated as uncontrolled 09/25/2013  . BMI 38.0-38.9,adult 05/19/2013  . Nicotine addiction 05/19/2013  . Right atrial enlargement 05/19/2013    Past Medical History  Diagnosis Date  . Hypothyroidism   . Hypertension   . Stroke [facility])     Past Surgical History  Procedure Laterality Date  . Tubal ligation    . Endarterectomy Right 08/05/2014    Procedure: Right Carotid ENDARTERECTOMY ;  Surgeon: Elam Dutch, MD;  Location: [place];  Service: Vascular;  Laterality: Right;  . Patch angioplasty Right 08/05/2014    Procedure: PATCH ANGIOPLASTY;  Surgeon: Elam Dutch, MD;  Location: [facility] OR;  Service: Vascular;  Laterality: Right;    Social History  Substance Use Topics  . Smoking status: Former Smoker -- 20.00 packs/day    Types: Cigarettes  . Smokeless tobacco: None     Comment: quit smoking July 24, 2014  . Alcohol Use: 0.6 oz/week    0 Standard drinks or equivalent, 1 Glasses of wine per week     Comment: ocassionally - wine    Family History  Problem Relation Age of Onset  . Adopted: Yes    No Known Allergies  Medication list has been reviewed and updated.  Current Outpatient Prescriptions on File  Prior to Visit  Medication Sig Dispense Refill  . aspirin 81 MG chewable tablet Chew 1 tablet (81 mg total) by mouth daily.    Marland Kitchen atorvastatin (LIPITOR) 80 MG tablet TAKE ONE TABLET BY MOUTH ONCE DAILY 30 tablet 0  . ergocalciferol (DRISDOL) 50000 UNITS capsule Take 1 capsule (50,000 Units total) by mouth once a week. 12 capsule 1  . levothyroxine (SYNTHROID, LEVOTHROID) 88 MCG tablet Take 1 tablet (88 mcg total) by mouth daily. 30 tablet 0  . lisinopril (PRINIVIL,ZESTRIL) 5 MG tablet Take 1 tablet (5 mg total) by mouth daily. 30 tablet 1  . metFORMIN (GLUCOPHAGE) 500 MG tablet TAKE ONE TABLET BY MOUTH TWICE DAILY WITH MEALS. PATIENT NEEDS AN  APPOINTMENT 60 tablet 0  . blood glucose meter kit and supplies KIT Test blood sugar once daily. Dx code: E11.9 1 each 0  . blood glucose meter kit and supplies KIT Dispense based on patient and insurance preference. Use up to four times daily as directed. (FOR ICD-9 250.00, 250.01). 1 each 0  . glucose blood test strip Test blood sugar twice daily. Dx code: E11.9 100 each 3  . Lancets MISC Test blood sugar once daily. Dx code: E11.9 100 each 3   No current facility-administered medications on file prior to visit.    ROS ROS otherwise unremarkable unless listed above.  Physical Examination: BP 124/70 mmHg  Pulse 68  Temp(Src) 97.9 F (36.6 C) (Oral)  Resp 16  Ht '5\' 2"'  (1.575 m)  Wt 147 lb 12.8 oz (67.042 kg)  BMI 27.03 kg/m2  SpO2 97% Ideal Body Weight: Weight in (lb) to have BMI = 25: 136.4  Physical Exam  Constitutional: She is oriented to person, place, and time. She appears well-developed and well-nourished. No distress.  HENT:  Head: Normocephalic and atraumatic.  Right Ear: External ear normal.  Left Ear: External ear normal.  Eyes: Conjunctivae and EOM are normal. Pupils are equal, round, and reactive to light.  Cardiovascular: Normal rate and regular rhythm.  Exam reveals no gallop and no friction rub.   No murmur heard. Pulses:      Radial pulses are 2+ on the right side, and 2+ on the left side.       Dorsalis pedis pulses are 2+ on the right side, and 2+ on the left side.  Pulmonary/Chest: Effort normal. No apnea. No respiratory distress. She has no decreased breath sounds. She has no wheezes. She has no rhonchi.  Neurological: She is alert and oriented to person, place, and time.  Skin: She is not diaphoretic.  Psychiatric: She has a normal mood and affect. Her behavior is normal.   Assessment and Plan: Carla Hopkins is a 65 y.o. female who is here today for recheck of diabetes hypothyroidism and vitamin D deficiency. She is also here for medication refill. I'm  restarting the Synthroid. I have advised her to return in 6 weeks for a labs only. Her TSH will likely be skewed if we do it today so we will delay it until she is titrated to her normal dosing at this time. We discussed health maintenance at this time. She is overdue with various mammogram, DEXA scan, colonoscopy. She is very reluctant to have a colonoscopy. She has stated that at the physical exam which she will do in 6 months--she will proceed with pap smear which she has not had in years.  This will likely be the last, if this is normal.  Hypothyroidism, unspecified hypothyroidism type - Plan: levothyroxine (  SYNTHROID, LEVOTHROID) 60 MCG tablet, TSH  Type 2 diabetes mellitus without complication, without long-term current use of insulin (HCC) - Plan: metFORMIN (GLUCOPHAGE) 500 MG tablet, COMPLETE METABOLIC PANEL WITH GFR, Hemoglobin A1C  Benign essential HTN - Plan: lisinopril (PRINIVIL,ZESTRIL) 5 MG tablet, COMPLETE METABOLIC PANEL WITH GFR  Vitamin D deficiency - Plan: Vitamin D 1,25 dihydroxy  Hyperlipemia - Plan: Lipid panel  Ivar Drape, PA-C [facility] 08/11/2015 10:36 AM

## 2015-08-11 NOTE — Patient Instructions (Addendum)
     IF you received an x-ray today, you will receive an invoice from [facility]. Please contact [facility] at [phone] with questions or concerns regarding your invoice.   IF you received labwork today, you will receive an invoice from [employer]. Please contact [facility] at [phone] with questions or concerns regarding your invoice.   Our billing staff will not be able to assist you with questions regarding bills from these companies.  You will be contacted with the lab results as soon as they are available. The fastest way to get your results is to activate your My Chart account. Instructions are located on the last page of this paperwork. If you have not heard from [place] regarding the results in 2 weeks, please contact this office.    I will have your lab results within the next 7-10 days. Continue to exercise and diabetic diet.  Sounds like you are doing a great job!! You will return in 6 months for a physical exam.  We will then schedule some health maintenance labs and appointments.   You will return in 6 weeks for recheck of your lipids (cholesterol and such) and the thyroid.

## 2015-08-13 ENCOUNTER — Other Ambulatory Visit: Payer: Self-pay

## 2015-08-13 LAB — VITAMIN D 1,25 DIHYDROXY
VITAMIN D 1, 25 (OH) TOTAL: 24 pg/mL (ref 18–72)
VITAMIN D2 1, 25 (OH): 24 pg/mL

## 2015-08-13 MED ORDER — ACCU-CHEK GUIDE CONTROL VI LIQD: 1.0000 | Freq: Every day | Status: DC

## 2015-08-13 MED ORDER — ACCU-CHEK NANO SMARTVIEW W/DEVICE KIT: PACK | Status: DC

## 2015-08-13 MED ORDER — ALCOHOL SWABS PADS: MEDICATED_PAD | Status: DC

## 2015-08-13 MED ORDER — ACCU-CHEK FASTCLIX LANCETS MISC: Status: DC

## 2015-08-13 MED ORDER — GLUCOSE BLOOD VI STRP
ORAL_STRIP | Status: DC
Start: 2015-08-13 — End: 2016-09-08

## 2015-08-13 NOTE — Telephone Encounter (Signed)
Pharm faxed req for vit D RF. [name], you did not RF this at [place], probably waiting for test results. Do you want to RF? Pended.

## 2015-08-20 ENCOUNTER — Telehealth: Payer: Self-pay

## 2015-08-20 NOTE — Telephone Encounter (Signed)
Pt called for lab results 

## 2015-08-22 NOTE — Telephone Encounter (Signed)
Left voicemail of her normal lab results. A1c has gone down considerably from 6.1-5.9 which is good. We will continue with the same diabetic dosage. I have advised that we will discontinue the vitamin D at this time as her vitamin D levels are within normal range.

## 2015-08-28 ENCOUNTER — Encounter: Payer: Self-pay | Admitting: Family

## 2015-09-03 ENCOUNTER — Ambulatory Visit (HOSPITAL_COMMUNITY)
Admission: RE | Admit: 2015-09-03 | Discharge: 2015-09-03 | Disposition: A | Discharge status: Home / Self Care | Payer: Commercial Managed Care - HMO | Source: Ambulatory Visit | Attending: Vascular Surgery | Admitting: Vascular Surgery

## 2015-09-03 ENCOUNTER — Ambulatory Visit (INDEPENDENT_AMBULATORY_CARE_PROVIDER_SITE_OTHER): Payer: Commercial Managed Care - HMO | Admitting: Family

## 2015-09-03 ENCOUNTER — Encounter: Payer: Self-pay | Admitting: Family

## 2015-09-03 VITALS — BP 98/68 | HR 75 | Ht 62.0 in | Wt 145.6 lb

## 2015-09-03 DIAGNOSIS — I6523 Occlusion and stenosis of bilateral carotid arteries: Secondary | ICD-10-CM | POA: Diagnosis not present

## 2015-09-03 DIAGNOSIS — Z8673 Personal history of transient ischemic attack (TIA), and cerebral infarction without residual deficits: Secondary | ICD-10-CM

## 2015-09-03 DIAGNOSIS — I10 Essential (primary) hypertension: Secondary | ICD-10-CM | POA: Diagnosis not present

## 2015-09-03 DIAGNOSIS — I6521 Occlusion and stenosis of right carotid artery: Secondary | ICD-10-CM

## 2015-09-03 DIAGNOSIS — Z9889 Other specified postprocedural states: Secondary | ICD-10-CM | POA: Diagnosis not present

## 2015-09-03 DIAGNOSIS — Z48812 Encounter for surgical aftercare following surgery on the circulatory system: Secondary | ICD-10-CM | POA: Diagnosis not present

## 2015-09-03 LAB — VAS US CAROTID
LCCADDIAS: -22 cm/s
LCCADSYS: -82 cm/s
LCCAPDIAS: 17 cm/s
LCCAPSYS: 76 cm/s
LEFT ECA DIAS: -15 cm/s
LEFT VERTEBRAL DIAS: 15 cm/s
LICADDIAS: -21 cm/s
LICADSYS: -46 cm/s
LICAPSYS: 65 cm/s
Left ICA prox dias: 24 cm/s
RCCAPSYS: 80 cm/s
RIGHT CCA MID DIAS: 15 cm/s
RIGHT ECA DIAS: -15 cm/s
Right CCA prox dias: 18 cm/s
Right cca dist sys: -48 cm/s

## 2015-09-03 NOTE — Patient Instructions (Signed)
Stroke Prevention Some medical conditions and behaviors are associated with an increased chance of having a stroke. You may prevent a stroke by making healthy choices and managing medical conditions. HOW CAN I REDUCE MY RISK OF HAVING A STROKE?   Stay physically active. Get at least 30 minutes of activity on most or all days.  Do not smoke. It may also be helpful to avoid exposure to secondhand smoke.  Limit alcohol use. Moderate alcohol use is considered to be:  No more than 2 drinks per day for men.  No more than 1 drink per day for nonpregnant women.  Eat healthy foods. This involves:  Eating 5 or more servings of fruits and vegetables a day.  Making dietary changes that address high blood pressure (hypertension), high cholesterol, diabetes, or obesity.  Manage your cholesterol levels.  Making food choices that are high in fiber and low in saturated fat, trans fat, and cholesterol may control cholesterol levels.  Take any prescribed medicines to control cholesterol as directed by your health care provider.  Manage your diabetes.  Controlling your carbohydrate and sugar intake is recommended to manage diabetes.  Take any prescribed medicines to control diabetes as directed by your health care provider.  Control your hypertension.  Making food choices that are low in salt (sodium), saturated fat, trans fat, and cholesterol is recommended to manage hypertension.  Ask your health care provider if you need treatment to lower your blood pressure. Take any prescribed medicines to control hypertension as directed by your health care provider.  If you are 18-39 years of age, have your blood pressure checked every 3-5 years. If you are 40 years of age or older, have your blood pressure checked every year.  Maintain a healthy weight.  Reducing calorie intake and making food choices that are low in sodium, saturated fat, trans fat, and cholesterol are recommended to manage  weight.  Stop drug abuse.  Avoid taking birth control pills.  Talk to your health care provider about the risks of taking birth control pills if you are over 35 years old, smoke, get migraines, or have ever had a blood clot.  Get evaluated for sleep disorders (sleep apnea).  Talk to your health care provider about getting a sleep evaluation if you snore a lot or have excessive sleepiness.  Take medicines only as directed by your health care provider.  For some people, aspirin or blood thinners (anticoagulants) are helpful in reducing the risk of forming abnormal blood clots that can lead to stroke. If you have the irregular heart rhythm of atrial fibrillation, you should be on a blood thinner unless there is a good reason you cannot take them.  Understand all your medicine instructions.  Make sure that other conditions (such as anemia or atherosclerosis) are addressed. SEEK IMMEDIATE MEDICAL CARE IF:   You have sudden weakness or numbness of the face, arm, or leg, especially on one side of the body.  Your face or eyelid droops to one side.  You have sudden confusion.  You have trouble speaking (aphasia) or understanding.  You have sudden trouble seeing in one or both eyes.  You have sudden trouble walking.  You have dizziness.  You have a loss of balance or coordination.  You have a sudden, severe headache with no known cause.  You have new chest pain or an irregular heartbeat. Any of these symptoms may represent a serious problem that is an emergency. Do not wait to see if the symptoms will   go away. Get medical help at once. Call your local emergency services (911 in [place] Do not drive yourself to the hospital.   This information is not intended to replace advice given to you by your health care provider. Make sure you discuss any questions you have with your health care provider.   Document Released: 04/21/2004 Document Revised: 04/04/2014 Document Reviewed:  09/14/2012 Elsevier Interactive Patient Education 2016 Elsevier Inc.  

## 2015-09-03 NOTE — Progress Notes (Signed)
Chief Complaint: Follow up Extracranial Carotid Artery Stenosis   History of Present Illness  Carla Hopkins is a 65 y.o. female patient of Dr. Oneida Alar who returns for follow-up today. She previously had a stroke to the right brain in April 2016. She subsequently underwent right carotid endarterectomy in May 2016. She has had no further events since then. She currently is on aspirin. She still has some residual left upper extremity weakness. She is also followed by Dr. Leonie Man with neurology. Other chronic medical problem hypertension which is currently stable.  Review of systems: She denies shortness of breath. She denies chest pain. She denies claudication symptoms with walking.  She uses her stationary bike for the equivalent of 3 miles daily. The patient denies New Medical or Surgical History.  Pt Diabetic: yes, A1C was 5.9 in May 2017 (review of records) Pt smoker: former smoker, quit April 2016  Pt meds include: Statin : yes ASA: yes Other anticoagulants/antiplatelets: no   Past Medical History  Diagnosis Date  . Hypothyroidism   . Hypertension   . Stroke [facility])     Social History Social History  Substance Use Topics  . Smoking status: Former Smoker -- 20.00 packs/day    Types: Cigarettes  . Smokeless tobacco: None     Comment: quit smoking July 24, 2014  . Alcohol Use: 0.6 oz/week    0 Standard drinks or equivalent, 1 Glasses of wine per week     Comment: ocassionally - wine    Family History Family History  Problem Relation Age of Onset  . Adopted: Yes    Surgical History Past Surgical History  Procedure Laterality Date  . Tubal ligation    . Endarterectomy Right 08/05/2014    Procedure: Right Carotid ENDARTERECTOMY ;  Surgeon: Elam Dutch, MD;  Location: [place];  Service: Vascular;  Laterality: Right;  . Patch angioplasty Right 08/05/2014    Procedure: PATCH ANGIOPLASTY;  Surgeon: Elam Dutch, MD;  Location: [facility] OR;  Service: Vascular;  Laterality:  Right;    No Known Allergies  Current Outpatient Prescriptions  Medication Sig Dispense Refill  . ACCU-CHEK FASTCLIX LANCETS MISC Test blood sugar once daily. Dx code: E11.9  100 each 3  . Alcohol Swabs PADS Test blood sugar once daily. Dx code: E11.9 100 each 3  . aspirin 81 MG chewable tablet Chew 1 tablet (81 mg total) by mouth daily.    Marland Kitchen atorvastatin (LIPITOR) 80 MG tablet Take 1 tablet (80 mg total) by mouth daily. 90 tablet 1  . Blood Glucose Calibration (ACCU-CHEK GUIDE CONTROL) LIQD 1 each by In Vitro route daily. Test blood sugar once daily. Dx code: E11.9  1 each 3  . blood glucose meter kit and supplies KIT Test blood sugar once daily. Dx code: E11.9 1 each 0  . blood glucose meter kit and supplies KIT Dispense based on patient and insurance preference. Use up to four times daily as directed. (FOR ICD-9 250.00, 250.01). 1 each 0  . Blood Glucose Monitoring Suppl (ACCU-CHEK NANO SMARTVIEW) w/Device KIT Test blood sugar once daily. Dx code: E11.9  1 kit 0  . glucose blood (ACCU-CHEK SMARTVIEW) test strip Test blood sugar once daily. Dx code: E11.9  100 each 3  . levothyroxine (SYNTHROID, LEVOTHROID) 88 MCG tablet Take 1 tablet (88 mcg total) by mouth daily. 90 tablet 1  . lisinopril (PRINIVIL,ZESTRIL) 5 MG tablet Take 1 tablet (5 mg total) by mouth daily. 90 tablet 1  . metFORMIN (GLUCOPHAGE) 500 MG  tablet Take 1 tablet (500 mg total) by mouth 2 (two) times daily with a meal. 180 tablet 1   No current facility-administered medications for this visit.    Review of Systems : See HPI for pertinent positives and negatives.  Physical Examination  Filed Vitals:   09/03/15 1452  BP: 113/74  Pulse: 75  Height: _0  (1.575 m)  Weight: 145 lb 9.6 oz (66.044 kg)  SpO2: 99%   Body mass index is 26.62 kg/(m^2).  General: WDWN female in NAD GAIT: normal Eyes: PERRLA Pulmonary:  Respirations are non-labored, CTAB  Cardiac: regular rhythm, no detected murmur.  VASCULAR  EXAM Carotid Bruits Right Left   Negative Negative    Aorta is not palpable. Radial pulses are 2+ palpable and =.                                                                                                                            LE Pulses Right Left       POPLITEAL  not palpable   not palpable       POSTERIOR TIBIAL   palpable    palpable        DORSALIS PEDIS      ANTERIOR TIBIAL  palpable   palpable     Gastrointestinal: soft, nontender, BS WNL, no r/g, no palpable masses.  Musculoskeletal: no muscle atrophy/wasting. M/S 5/5 throughout except 2/5 in left UE, extremities without ischemic changes.  Neurologic: A&O X 3; Appropriate Affect, Speech is normal CN 2-12 intact, pain and light touch intact in extremities, Motor exam as listed above.   Non-Invasive Vascular Imaging CAROTID DUPLEX 09/03/2015   Right ICA: 1- 39 % stenosis, CEA site. Left ICA: 1 - 39 % stenosis. Bilateral vertebral artery flow is antegrade. No significant change compared to 02/26/15.   Assessment: Carla Hopkins is a 65 y.o. female who is s/p right carotid endarterectomy in May 2016. She had a preoperative stroke and has moderate residual left upper extremity weakness. She has had no subsequent stroke or TIA. Today's carotid duplex suggests minimal bilateral ICA stenoses. No significant change compared to 02/26/15.    Plan: Follow-up in 1 year with Carotid Duplex scan.   I discussed in depth with the patient the nature of atherosclerosis, and emphasized the importance of maximal medical management including strict control of blood pressure, blood glucose, and lipid levels, obtaining regular exercise, and continued cessation of smoking.  The patient is aware that without maximal medical management the underlying atherosclerotic disease process will progress, limiting the benefit of any interventions. The patient was given information about stroke prevention and what symptoms should prompt the  patient to seek immediate medical care. Thank you for allowing [place] to participate in this patient's care.  Clemon Chambers, RN, MSN, FNP-C [facility] Office: [phone]  Clinic Physician: Oneida Alar  09/03/2015 2:53 PM

## 2015-09-08 ENCOUNTER — Telehealth: Payer: Self-pay

## 2015-09-08 NOTE — Telephone Encounter (Signed)
Pt states she changed to [place] and the lisinopril did not follow so she is asking that we fax a new rx and when we fax it to [facility] they ask that we put the patient id number of [id number] on the rx  Best number [phone]

## 2015-09-09 ENCOUNTER — Ambulatory Visit (INDEPENDENT_AMBULATORY_CARE_PROVIDER_SITE_OTHER): Payer: Commercial Managed Care - HMO | Admitting: Physician Assistant

## 2015-09-09 VITALS — BP 122/78 | HR 98 | Temp 98.0°F | Resp 16 | Ht 62.0 in | Wt 142.8 lb

## 2015-09-09 DIAGNOSIS — I10 Essential (primary) hypertension: Secondary | ICD-10-CM

## 2015-09-09 DIAGNOSIS — S81801A Unspecified open wound, right lower leg, initial encounter: Secondary | ICD-10-CM

## 2015-09-09 DIAGNOSIS — R7302 Impaired glucose tolerance (oral): Secondary | ICD-10-CM | POA: Diagnosis not present

## 2015-09-09 DIAGNOSIS — E119 Type 2 diabetes mellitus without complications: Secondary | ICD-10-CM | POA: Diagnosis not present

## 2015-09-09 MED ORDER — BLOOD GLUCOSE MONITOR KIT: PACK | Status: DC

## 2015-09-09 MED ORDER — LISINOPRIL 5 MG PO TABS: 5.0000 mg | ORAL_TABLET | Freq: Every day | ORAL | Status: DC

## 2015-09-09 NOTE — Patient Instructions (Addendum)
     IF you received an x-ray today, you will receive an invoice from [facility]. Please contact [facility] at [phone] with questions or concerns regarding your invoice.   IF you received labwork today, you will receive an invoice from [employer]. Please contact [facility] at [phone] with questions or concerns regarding your invoice.   Our billing staff will not be able to assist you with questions regarding bills from these companies.  You will be contacted with the lab results as soon as they are available. The fastest way to get your results is to activate your My Chart account. Instructions are located on the last page of this paperwork. If you have not heard from [place] regarding the results in 2 weeks, please contact this office.    Keep xeroform in the area.  Please place new xeroform if it falls off.  You are able to keep it uncovered when you are showering.

## 2015-09-09 NOTE — Telephone Encounter (Signed)
Pt came in to be seen and this has been done.

## 2015-09-12 LAB — WOUND CULTURE
Gram Stain: NONE SEEN
ORGANISM ID, BACTERIA: NORMAL

## 2015-09-16 NOTE — Progress Notes (Signed)
[facility] [address] [place] [address] [phone]  Date:  09/09/2015   Name:  Carla Hopkins   DOB:  [birth year]   MRN:  [id number]  PCP:  Ivar Drape, PA    History of Present Illness:  Carla Hopkins is a 65 y.o. female patient who presents to [facility] for cc of wound on right leg.  Patient states that she noticed a bump on groin that was never extremely painful, however then the bump opened.  It was draining fluid, which she bandaged.  She has no fever, swollen lymph nodes, or nausea, or dizziness.   She would also like refill of lisinopril.  She denies any chest pains, palpitations, sob, or leg swelling.         Patient Active Problem List   Diagnosis Date Noted  . History of CVA with residual deficit 12/30/2014  . Left hemiparesis ([place]) 10/13/2014  . Left-sided neglect 10/13/2014  . History of right MCA stroke 08/06/2014  . Carotid stenosis 08/05/2014  . Acute right MCA stroke ([place]) 07/30/2014  . Cerebral infarction due to thrombosis of right middle cerebral artery ([place])   . Thyroid activity decreased   . Left-sided weakness   . Stroke ([place])   . Essential hypertension   . Carotid artery stenosis   . CVA (cerebral infarction) 07/24/2014  . Hyperlipidemia 07/24/2014  . Diabetes ([place]) 07/24/2014  . Elevated troponin 07/24/2014  . Tobacco abuse 07/24/2014  . Benign essential HTN 07/23/2014  . Obstructive sleep apnea 07/23/2014  . Hypothyroidism 07/03/2014  . Type II or unspecified type diabetes mellitus without mention of complication, not stated as uncontrolled 09/25/2013  . BMI 38.0-38.9,adult 05/19/2013  . Nicotine addiction 05/19/2013  . Right atrial enlargement 05/19/2013    Past Medical History  Diagnosis Date  . Hypothyroidism   . Hypertension   . Stroke [facility])     Past Surgical History  Procedure Laterality Date  . Tubal ligation    . Endarterectomy Right 08/05/2014    Procedure: Right Carotid ENDARTERECTOMY ;  Surgeon:  Elam Dutch, MD;  Location: [place];  Service: Vascular;  Laterality: Right;  . Patch angioplasty Right 08/05/2014    Procedure: PATCH ANGIOPLASTY;  Surgeon: Elam Dutch, MD;  Location: [facility] OR;  Service: Vascular;  Laterality: Right;    Social History  Substance Use Topics  . Smoking status: Former Smoker -- 20.00 packs/day    Types: Cigarettes  . Smokeless tobacco: None     Comment: quit smoking July 24, 2014  . Alcohol Use: 0.6 oz/week    0 Standard drinks or equivalent, 1 Glasses of wine per week     Comment: ocassionally - wine    Family History  Problem Relation Age of Onset  . Adopted: Yes    No Known Allergies  Medication list has been reviewed and updated.  Current Outpatient Prescriptions on File Prior to Visit  Medication Sig Dispense Refill  . ACCU-CHEK FASTCLIX LANCETS MISC Test blood sugar once daily. Dx code: E11.9  100 each 3  . Alcohol Swabs PADS Test blood sugar once daily. Dx code: E11.9 100 each 3  . aspirin 81 MG chewable tablet Chew 1 tablet (81 mg total) by mouth daily.    Marland Kitchen atorvastatin (LIPITOR) 80 MG tablet Take 1 tablet (80 mg total) by mouth daily. 90 tablet 1  . Blood Glucose Calibration (ACCU-CHEK GUIDE CONTROL) LIQD 1 each by In Vitro route daily. Test blood sugar once daily. Dx code:  E11.9  1 each 3  . Blood Glucose Monitoring Suppl (ACCU-CHEK NANO SMARTVIEW) w/Device KIT Test blood sugar once daily. Dx code: E11.9  1 kit 0  . glucose blood (ACCU-CHEK SMARTVIEW) test strip Test blood sugar once daily. Dx code: E11.9  100 each 3  . levothyroxine (SYNTHROID, LEVOTHROID) 88 MCG tablet Take 1 tablet (88 mcg total) by mouth daily. 90 tablet 1  . metFORMIN (GLUCOPHAGE) 500 MG tablet Take 1 tablet (500 mg total) by mouth 2 (two) times daily with a meal. 180 tablet 1   No current facility-administered medications on file prior to visit.    ROS ROS otherwise unremarkable unless listed above.   Physical Examination: BP 122/78 mmHg  Pulse 98   Temp(Src) 98 F (36.7 C) (Oral)  Resp 16  Ht '5\' 2"'  (1.575 m)  Wt 142 lb 12.8 oz (64.774 kg)  BMI 26.11 kg/m2  SpO2 96% Ideal Body Weight: Weight in (lb) to have BMI = 25: 136.4  Physical Exam  Constitutional: She is oriented to person, place, and time. She appears well-developed and well-nourished. No distress.  HENT:  Head: Normocephalic and atraumatic.  Right Ear: External ear normal.  Left Ear: External ear normal.  Eyes: Conjunctivae and EOM are normal. Pupils are equal, round, and reactive to light.  Cardiovascular: Normal rate.   Pulmonary/Chest: Effort normal. No respiratory distress.  Genitourinary:  Right groin with sac removed without difficulty.  3 cm diameter past the dermal layer of beefy red tissue.  Minimally tender.    Neurological: She is alert and oriented to person, place, and time.  Skin: She is not diaphoretic.  Psychiatric: She has a normal mood and affect. Her behavior is normal.     Assessment and Plan: Carla Hopkins is a 65 y.o. female who is here today wound on leg.   -refill of the lisinopril -glucose kit ordered -rtc in 5 days for recheck of wound.    Glucose intolerance (impaired glucose tolerance)  Benign essential HTN - Plan: lisinopril (PRINIVIL,ZESTRIL) 5 MG tablet  Diabetes mellitus without complication ([place]) - Plan: blood glucose meter kit and supplies KIT  Wound of right leg, initial encounter - Plan: WOUND CULTURE    Ivar Drape, PA-C [facility] 09/16/2015 8:46 PM

## 2015-09-17 ENCOUNTER — Ambulatory Visit (INDEPENDENT_AMBULATORY_CARE_PROVIDER_SITE_OTHER): Payer: Commercial Managed Care - HMO | Admitting: Physician Assistant

## 2015-09-17 VITALS — BP 132/82 | HR 90 | Temp 98.2°F | Resp 16

## 2015-09-17 DIAGNOSIS — E039 Hypothyroidism, unspecified: Secondary | ICD-10-CM

## 2015-09-17 DIAGNOSIS — S81801A Unspecified open wound, right lower leg, initial encounter: Secondary | ICD-10-CM

## 2015-09-17 LAB — TSH: TSH: 5.18 m[IU]/L — AB

## 2015-09-17 NOTE — Patient Instructions (Signed)
     IF you received an x-ray today, you will receive an invoice from [facility]. Please contact [facility] at [phone] with questions or concerns regarding your invoice.   IF you received labwork today, you will receive an invoice from [facility]. Please contact [facility] at [phone] with questions or concerns regarding your invoice.   Our billing staff will not be able to assist you with questions regarding bills from these companies.  You will be contacted with the lab results as soon as they are available. The fastest way to get your results is to activate your My Chart account. Instructions are located on the last page of this paperwork. If you have not heard from us regarding the results in 2 weeks, please contact this office.     We recommend that you schedule a mammogram for breast cancer screening. Typically, you do not need a referral to do this. Please contact a local imaging center to schedule your mammogram.  [facility] - [phone]  *ask for the Radiology Department [facility] - [phone] or [phone]  [facility] - [phone] [facility] - [phone] [facility] - [phone]  *ask for the Radiology Department [facility] - [phone]  *ask for the Radiology Department [facility] - [phone]  *ask for the Mammography Department [facility] - [phone]  

## 2015-09-24 NOTE — Progress Notes (Signed)
[facility] [address], [place] [address] [phone]  Date:  09/17/2015   Name:  Carla Hopkins   DOB:  March 29, 1950   MRN:  [id number]  PCP:  Ivar Drape, PA    History of Present Illness:  Janaia Hopkins is a 65 y.o. female patient who presents to [facility] for thyroid recheck. She is also here for wound follow-up. Patient was here one week ago with what appeared to be a ruptured sebaceous cyst. Wound was cleansed and Xeroform placed. Advised to change if falling out but would return in one week. Today she reports that it has improved a lot. She has kept the Xeroform in place. There is been no fever and pain is minimal. She has changed the dressing once a day.   We will started thyroid medication 5 weeks ago. She reports no side effects. She denies chest pain, palpitations, anxiety, skin changes, nausea, vomiting, constipation.   Patient Active Problem List   Diagnosis Date Noted  . History of CVA with residual deficit 12/30/2014  . Left hemiparesis ([place]) 10/13/2014  . Left-sided neglect 10/13/2014  . History of right MCA stroke 08/06/2014  . Carotid stenosis 08/05/2014  . Acute right MCA stroke ([place]) 07/30/2014  . Cerebral infarction due to thrombosis of right middle cerebral artery ([place])   . Thyroid activity decreased   . Left-sided weakness   . Stroke ([place])   . Essential hypertension   . Carotid artery stenosis   . CVA (cerebral infarction) 07/24/2014  . Hyperlipidemia 07/24/2014  . Diabetes ([place]) 07/24/2014  . Elevated troponin 07/24/2014  . Tobacco abuse 07/24/2014  . Benign essential HTN 07/23/2014  . Obstructive sleep apnea 07/23/2014  . Hypothyroidism 07/03/2014  . Type II or unspecified type diabetes mellitus without mention of complication, not stated as uncontrolled 09/25/2013  . BMI 38.0-38.9,adult 05/19/2013  . Nicotine addiction 05/19/2013  . Right atrial enlargement 05/19/2013    Past Medical History  Diagnosis Date  .  Hypothyroidism   . Hypertension   . Stroke [facility])     Past Surgical History  Procedure Laterality Date  . Tubal ligation    . Endarterectomy Right 08/05/2014    Procedure: Right Carotid ENDARTERECTOMY ;  Surgeon: Elam Dutch, MD;  Location: [place];  Service: Vascular;  Laterality: Right;  . Patch angioplasty Right 08/05/2014    Procedure: PATCH ANGIOPLASTY;  Surgeon: Elam Dutch, MD;  Location: [facility] OR;  Service: Vascular;  Laterality: Right;    Social History  Substance Use Topics  . Smoking status: Former Smoker -- 20.00 packs/day    Types: Cigarettes  . Smokeless tobacco: None     Comment: quit smoking July 24, 2014  . Alcohol Use: 0.6 oz/week    0 Standard drinks or equivalent, 1 Glasses of wine per week     Comment: ocassionally - wine    Family History  Problem Relation Age of Onset  . Adopted: Yes    No Known Allergies  Medication list has been reviewed and updated.  Current Outpatient Prescriptions on File Prior to Visit  Medication Sig Dispense Refill  . ACCU-CHEK FASTCLIX LANCETS MISC Test blood sugar once daily. Dx code: E11.9  100 each 3  . Alcohol Swabs PADS Test blood sugar once daily. Dx code: E11.9 100 each 3  . aspirin 81 MG chewable tablet Chew 1 tablet (81 mg total) by mouth daily.    Marland Kitchen atorvastatin (LIPITOR) 80 MG tablet Take 1 tablet (80 mg total)  by mouth daily. 90 tablet 1  . Blood Glucose Calibration (ACCU-CHEK GUIDE CONTROL) LIQD 1 each by In Vitro route daily. Test blood sugar once daily. Dx code: E11.9  1 each 3  . blood glucose meter kit and supplies KIT Dispense based on patient and insurance preference. 1 each 0  . Blood Glucose Monitoring Suppl (ACCU-CHEK NANO SMARTVIEW) w/Device KIT Test blood sugar once daily. Dx code: E11.9  1 kit 0  . glucose blood (ACCU-CHEK SMARTVIEW) test strip Test blood sugar once daily. Dx code: E11.9  100 each 3  . levothyroxine (SYNTHROID, LEVOTHROID) 88 MCG tablet Take 1 tablet (88 mcg total) by mouth  daily. 90 tablet 1  . lisinopril (PRINIVIL,ZESTRIL) 5 MG tablet Take 1 tablet (5 mg total) by mouth daily. 90 tablet 1  . metFORMIN (GLUCOPHAGE) 500 MG tablet Take 1 tablet (500 mg total) by mouth 2 (two) times daily with a meal. 180 tablet 1   No current facility-administered medications on file prior to visit.    ROS ROS otherwise unremarkable unless listed above.  Physical Examination: BP 132/82 mmHg  Pulse 90  Temp(Src) 98.2 F (36.8 C) (Oral)  Resp 16  SpO2 97% Ideal Body Weight:    Physical Exam  Constitutional: She is oriented to person, place, and time. She appears well-developed and well-nourished. No distress.  HENT:  Head: Normocephalic and atraumatic.  Right Ear: External ear normal.  Left Ear: External ear normal.  Eyes: Conjunctivae and EOM are normal. Pupils are equal, round, and reactive to light.  Cardiovascular: Normal rate.   Pulmonary/Chest: Effort normal. No respiratory distress.  Neurological: She is alert and oriented to person, place, and time.  Skin: She is not diaphoretic.  Granulated tissue forming well. This is healing without any erythema. Tenderness is minimal. Beefy red tissue less than 0.5 cm in depth. No purulent drainage.  Psychiatric: She has a normal mood and affect. Her behavior is normal.     Assessment and Plan: Carla Hopkins is a 65 y.o. female who is here today for thyroid recheck, and wound care This is healing nicely. Replaced Xeroform and advised continued wound care.   Advised to return in 1 week for recheck. Thyroid recheck.  Hypothyroidism, unspecified hypothyroidism type - Plan: TSH  Wound of right leg, initial encounter   Ivar Drape, PA-C [facility] 09/24/2015 9:36 AM

## 2015-09-25 ENCOUNTER — Ambulatory Visit (INDEPENDENT_AMBULATORY_CARE_PROVIDER_SITE_OTHER): Payer: Commercial Managed Care - HMO | Admitting: Physician Assistant

## 2015-09-25 VITALS — BP 100/68 | HR 99 | Temp 98.3°F | Resp 18 | Ht 62.0 in | Wt 146.0 lb

## 2015-09-25 DIAGNOSIS — S81801A Unspecified open wound, right lower leg, initial encounter: Secondary | ICD-10-CM | POA: Diagnosis not present

## 2015-09-25 NOTE — Patient Instructions (Addendum)
     IF you received an x-ray today, you will receive an invoice from [facility]. Please contact [facility] at [phone] with questions or concerns regarding your invoice.   IF you received labwork today, you will receive an invoice from [employer]. Please contact [facility] at [phone] with questions or concerns regarding your invoice.   Our billing staff will not be able to assist you with questions regarding bills from these companies.  You will be contacted with the lab results as soon as they are available. The fastest way to get your results is to activate your My Chart account. Instructions are located on the last page of this paperwork. If you have not heard from [place] regarding the results in 2 weeks, please contact this office.     We recommend that you schedule a mammogram for breast cancer screening. Typically, you do not need a referral to do this. Please contact a local imaging center to schedule your mammogram.  [facility] - [phone]  *ask for the Radiology Department [facility]) - [phone] or [phone]  [facility] - [phone] [place] [phone] [facility] - [phone]  *ask for the [facility] - [phone]  *ask for the Radiology Department [facility] - [phone]  *ask for the [place] - [phone]  Please increase the dose to 1.5 tablets.  We will recheck this in 6 weeks.   I would like you to change the dressing.  And wash the area with soap and water daily.  You no longer have to use a bandage, once the scabbing starts up.   Please return if this is not improving in 1 week to 10 days.

## 2015-09-27 NOTE — Progress Notes (Signed)
[facility] [address], [place] [address] [phone]  Date:  09/25/2015   Name:  Carla Hopkins   DOB:  1951-02-24   MRN:  [id number]  PCP:  Ivar Drape, PA    History of Present Illness:  Carla Hopkins is a 65 y.o. female patient who presents to [facility] for wound care. Patient had an open wound of unknown etiology, that included material consistent with a sebaceous cyst.  This as debrided, and xeroform dressed. She has returned weekly for follow up.  This is week 2.  She has no heavy exudate.  This is healing well for her.  She keeps a dressing.  No fever, increased pain.      Patient Active Problem List   Diagnosis Date Noted  . History of CVA with residual deficit 12/30/2014  . Left hemiparesis ([place]) 10/13/2014  . Left-sided neglect 10/13/2014  . History of right MCA stroke 08/06/2014  . Carotid stenosis 08/05/2014  . Acute right MCA stroke ([place]) 07/30/2014  . Cerebral infarction due to thrombosis of right middle cerebral artery ([place])   . Thyroid activity decreased   . Left-sided weakness   . Stroke ([place])   . Essential hypertension   . Carotid artery stenosis   . CVA (cerebral infarction) 07/24/2014  . Hyperlipidemia 07/24/2014  . Diabetes ([place]) 07/24/2014  . Elevated troponin 07/24/2014  . Tobacco abuse 07/24/2014  . Benign essential HTN 07/23/2014  . Obstructive sleep apnea 07/23/2014  . Hypothyroidism 07/03/2014  . Type II or unspecified type diabetes mellitus without mention of complication, not stated as uncontrolled 09/25/2013  . BMI 38.0-38.9,adult 05/19/2013  . Nicotine addiction 05/19/2013  . Right atrial enlargement 05/19/2013    Past Medical History  Diagnosis Date  . Hypothyroidism   . Hypertension   . Stroke [facility])     Past Surgical History  Procedure Laterality Date  . Tubal ligation    . Endarterectomy Right 08/05/2014    Procedure: Right Carotid ENDARTERECTOMY ;  Surgeon: Elam Dutch, MD;  Location: [place];   Service: Vascular;  Laterality: Right;  . Patch angioplasty Right 08/05/2014    Procedure: PATCH ANGIOPLASTY;  Surgeon: Elam Dutch, MD;  [facility];  Service: Vascular;  Laterality: Right;    Social History  Substance Use Topics  . Smoking status: Former Smoker -- 20.00 packs/day    Types: Cigarettes  . Smokeless tobacco: None     Comment: quit smoking July 24, 2014  . Alcohol Use: 0.6 oz/week    0 Standard drinks or equivalent, 1 Glasses of wine per week     Comment: ocassionally - wine    Family History  Problem Relation Age of Onset  . Adopted: Yes    No Known Allergies  Medication list has been reviewed and updated.  Current Outpatient Prescriptions on File Prior to Visit  Medication Sig Dispense Refill  . ACCU-CHEK FASTCLIX LANCETS MISC Test blood sugar once daily. Dx code: E11.9  100 each 3  . Alcohol Swabs PADS Test blood sugar once daily. Dx code: E11.9 100 each 3  . aspirin 81 MG chewable tablet Chew 1 tablet (81 mg total) by mouth daily.    Marland Kitchen atorvastatin (LIPITOR) 80 MG tablet Take 1 tablet (80 mg total) by mouth daily. 90 tablet 1  . Blood Glucose Calibration (ACCU-CHEK GUIDE CONTROL) LIQD 1 each by In Vitro route daily. Test blood sugar once daily. Dx code: E11.9  1 each 3  . blood glucose  meter kit and supplies KIT Dispense based on patient and insurance preference. 1 each 0  . Blood Glucose Monitoring Suppl (ACCU-CHEK NANO SMARTVIEW) w/Device KIT Test blood sugar once daily. Dx code: E11.9  1 kit 0  . glucose blood (ACCU-CHEK SMARTVIEW) test strip Test blood sugar once daily. Dx code: E11.9  100 each 3  . levothyroxine (SYNTHROID, LEVOTHROID) 88 MCG tablet Take 1 tablet (88 mcg total) by mouth daily. 90 tablet 1  . lisinopril (PRINIVIL,ZESTRIL) 5 MG tablet Take 1 tablet (5 mg total) by mouth daily. 90 tablet 1  . metFORMIN (GLUCOPHAGE) 500 MG tablet Take 1 tablet (500 mg total) by mouth 2 (two) times daily with a meal. 180 tablet 1   No current  facility-administered medications on file prior to visit.    ROS ROS otherwise unremarkable unless listed above.   Physical Examination: BP 100/68 mmHg  Pulse 99  Temp(Src) 98.3 F (36.8 C) (Oral)  Resp 18  Ht '5\' 2"'  (1.575 m)  Wt 146 lb (66.225 kg)  BMI 26.70 kg/m2  SpO2 96% Ideal Body Weight: Weight in (lb) to have BMI = 25: 136.4  Physical Exam  Constitutional: She is oriented to person, place, and time. She appears well-developed and well-nourished. No distress.  HENT:  Head: Normocephalic and atraumatic.  Right Ear: External ear normal.  Left Ear: External ear normal.  Eyes: Conjunctivae and EOM are normal. Pupils are equal, round, and reactive to light.  Cardiovascular: Normal rate.   Pulmonary/Chest: Effort normal. No respiratory distress.  Neurological: She is alert and oriented to person, place, and time.  Skin: She is not diaphoretic.  Right inner thigh with healing wound.  Granulated tissue that has minimized to about 1cm diameter.  Minimal tenderness.  No necrotic tissue.    Psychiatric: She has a normal mood and affect. Her behavior is normal.     Assessment and Plan: Carla Hopkins is a 65 y.o. female who is here today for follow up of wound. Week 2 this is healing very well.  Advised wound care and washing.  Keep bandaging until scabbed over.  rtc if wound does not appear to be healing after 1 week.  Wound of right leg, initial encounter    Ivar Drape, PA-C [facility] 09/27/2015 8:31 PM

## 2015-11-12 ENCOUNTER — Telehealth: Payer: Self-pay | Admitting: *Deleted

## 2015-11-12 ENCOUNTER — Telehealth: Payer: Self-pay | Admitting: Neurology

## 2015-11-12 NOTE — Telephone Encounter (Signed)
Pt called to advise [employer] needs medical records sent asap. She was told if they don't get it back by 30 days of the request her disability payments will be discontinued.

## 2015-11-12 NOTE — Telephone Encounter (Signed)
Pt medical records faxed to [employer] on 11/12/15 to Vickki Muff in claims.

## 2015-11-16 NOTE — Telephone Encounter (Signed)
Records fax by Hilda Blades in medical records.

## 2015-11-16 NOTE — Telephone Encounter (Signed)
LFt vm for patient that there is a 50.00 fee to have form filled out.Form is complete. Pt needs to pay [id number] which is [employer] policy.

## 2015-11-17 NOTE — Telephone Encounter (Signed)
Rn call patient about paying tor the fee for [employer] insurance. Rn stated the fee is 50.00 dollars per [employer] policy. Rn ask patient if the company was paying for the form. Pt will call to find out if they will pay for the form. Rn stated they can speak with Angie in accounting about the payment route. PT stated she will pay for the form ii [employer] will not pay for it. Rn requested patient call back tomorrow to give final answer. Pt verbalized understanding.

## 2015-11-23 NOTE — Telephone Encounter (Signed)
Rn call patient about form is 50.00 dollars. Pt stated she did call [employer] will pay for the forms of 50.00. PT stated they just need a invoice of 50.00 sent to them. Rn will tell Hilda Blades in medical records.

## 2015-11-23 NOTE — Telephone Encounter (Signed)
Rn gave release form and disability forms to Hilda Blades in medical records. She will send invoice to [employer] for payment.

## 2015-12-30 ENCOUNTER — Telehealth: Payer: Self-pay | Admitting: Neurology

## 2015-12-30 NOTE — Telephone Encounter (Signed)
LFt vm for patient that Dr. Leonie Man is out of the office till Monday. Rn stated we filled out a disability form on her and when did she return to work. Rn left vm that Dr. Leonie Man will make the decision if he can do the letter.

## 2015-12-30 NOTE — Telephone Encounter (Signed)
Patient called to request letter for [facility] for help with mortgage. Letter needs to state when she was under Dr. Clydene Fake care, last seen and when she was released to go back to work (August 30th, 2016).

## 2016-01-05 NOTE — Telephone Encounter (Signed)
Patient returned Katrina's call, please call [phone].

## 2016-01-05 NOTE — Telephone Encounter (Signed)
Currently do not after reviewing my office last office visit to support her

## 2016-01-05 NOTE — Telephone Encounter (Signed)
Rn call patient about letter for [employer]. Rn stated Dr. Leonie Man can do letter, but he cannot put she was back at work. Rn stated he can put diagnose, stroke, dates of visits. Also the letter will state we are filling out disability forms for her. Pt stated the letter needs to state she was back at work in 10/2014. Rn ask pt when she had the stroke did she return to work. Pt stated ' I never return to work after 10/2014 when I have my stroke. Rn stated the letter will say we are doing disability forms for her yearly. Pt verbalized understanding. Also Rn stated the letter can be sent to her home address. The address was confirm by Rn.

## 2016-01-05 NOTE — Telephone Encounter (Signed)
Correction . I meant kindly do a note stating her disability and recommendations from my last offfice note

## 2016-01-06 NOTE — Telephone Encounter (Signed)
Letter sent to pt via mail.

## 2016-01-08 DIAGNOSIS — Z0271 Encounter for disability determination: Secondary | ICD-10-CM

## 2016-01-27 ENCOUNTER — Other Ambulatory Visit: Payer: Self-pay | Admitting: Physician Assistant

## 2016-01-27 DIAGNOSIS — E039 Hypothyroidism, unspecified: Secondary | ICD-10-CM

## 2016-01-28 IMAGING — CR DG CHEST 2V
2 series · 2 of 2 positions shown · non-contrast
Comparison: None.

CLINICAL DATA: Smoker with cough, chest congestion and shortness of
breath.

EXAM:
CHEST  2 VIEW

[PA]
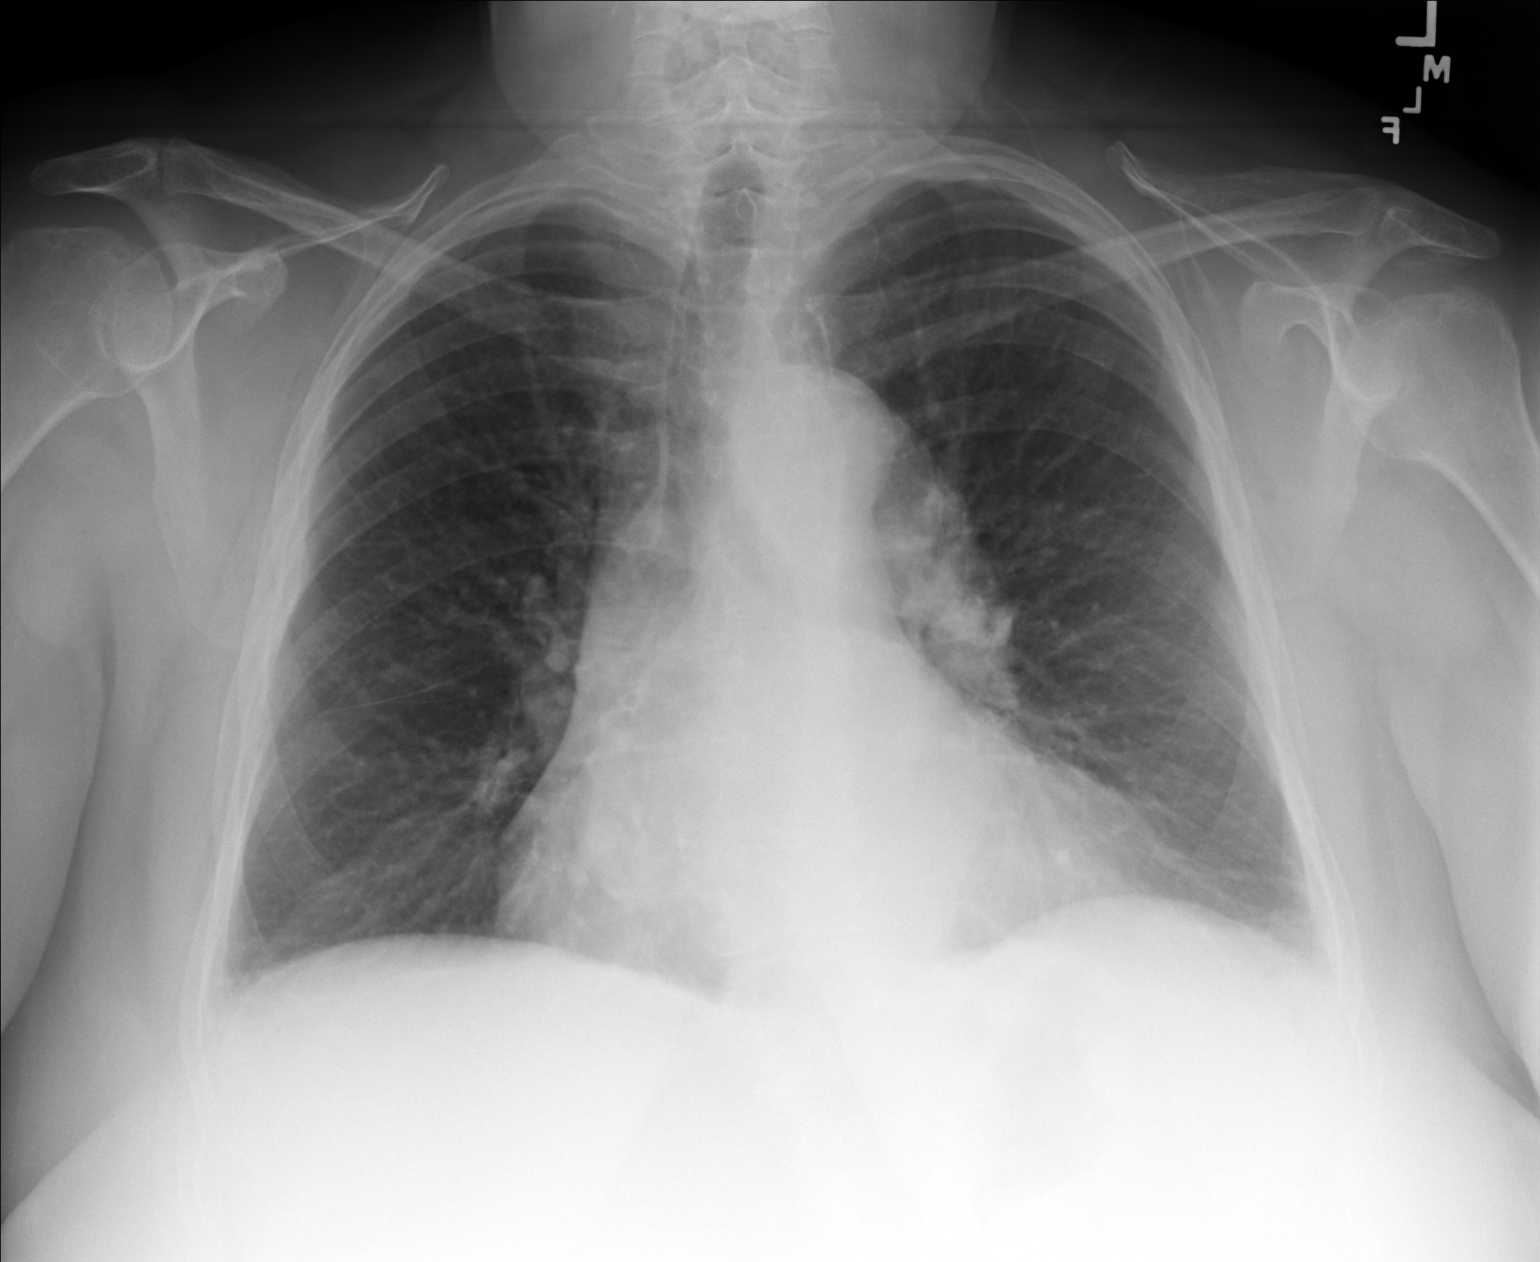

[lateral]
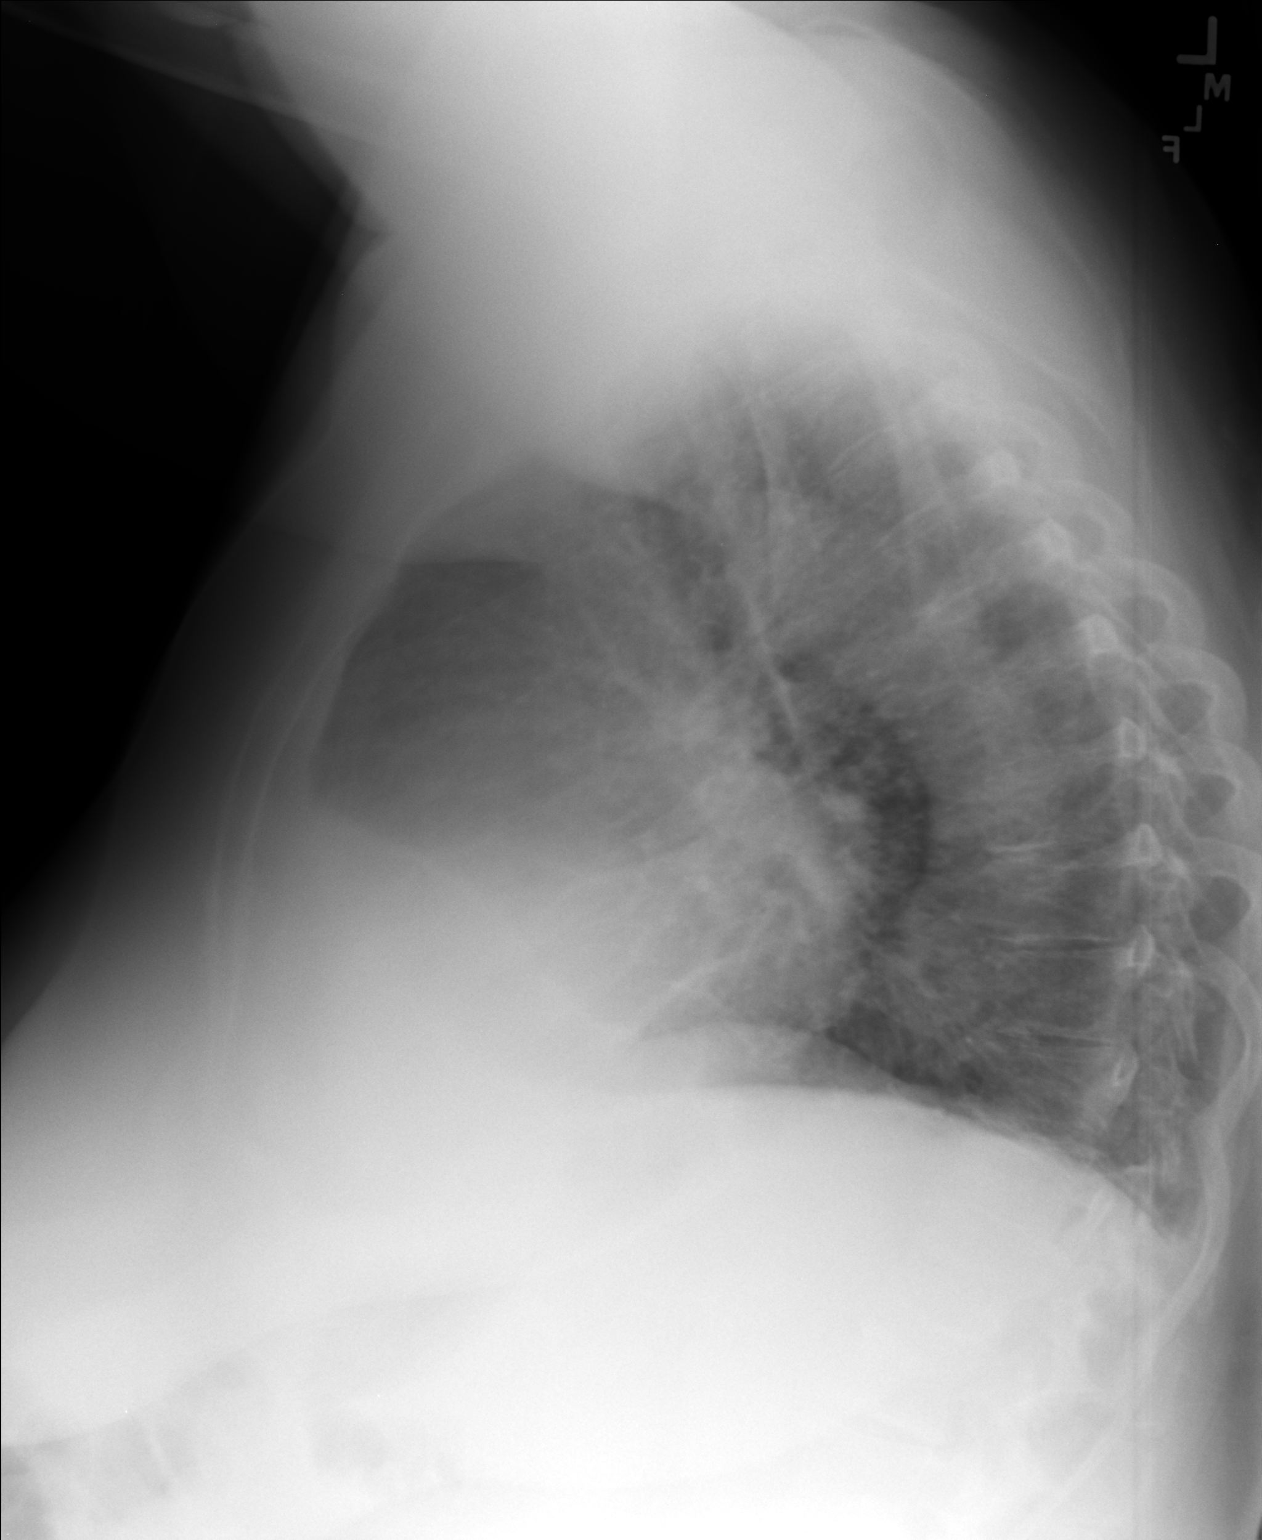

[2 of 2 positions shown; findings below may reference images not displayed]

FINDINGS: Cardiac silhouette mildly enlarged. Thoracic aorta tortuous and
atherosclerotic. Hilar and mediastinal contours otherwise
unremarkable. Minimal linear atelectasis or scarring in the lower
lobes. Lungs otherwise clear. No localized airspace consolidation.
No pleural effusions. No pneumothorax. Normal pulmonary vascularity.
Degenerative changes involving the thoracic spine with slight
exaggeration of the usual thoracic kyphosis.
IMPRESSION: Mild cardiomegaly. Linear atelectasis or scar in the lower lobes. No
acute cardiopulmonary disease otherwise.

## 2016-02-25 ENCOUNTER — Other Ambulatory Visit: Payer: Self-pay | Admitting: Physician Assistant

## 2016-02-25 DIAGNOSIS — E119 Type 2 diabetes mellitus without complications: Secondary | ICD-10-CM

## 2016-03-21 ENCOUNTER — Other Ambulatory Visit: Payer: Self-pay | Admitting: Physician Assistant

## 2016-03-21 DIAGNOSIS — I10 Essential (primary) hypertension: Secondary | ICD-10-CM

## 2016-04-07 ENCOUNTER — Other Ambulatory Visit: Payer: Self-pay | Admitting: Physician Assistant

## 2016-04-07 DIAGNOSIS — E039 Hypothyroidism, unspecified: Secondary | ICD-10-CM

## 2016-04-10 NOTE — Telephone Encounter (Signed)
Last abnormal  tsh 08/2015, needs recheck

## 2016-04-29 ENCOUNTER — Other Ambulatory Visit: Payer: Self-pay | Admitting: Physician Assistant

## 2016-04-29 DIAGNOSIS — E039 Hypothyroidism, unspecified: Secondary | ICD-10-CM

## 2016-04-29 DIAGNOSIS — I10 Essential (primary) hypertension: Secondary | ICD-10-CM

## 2016-04-30 NOTE — Telephone Encounter (Signed)
08/2015 last tshneeds ov

## 2016-05-02 ENCOUNTER — Encounter: Payer: Self-pay | Admitting: Cardiology

## 2016-05-12 DIAGNOSIS — Z0271 Encounter for disability determination: Secondary | ICD-10-CM

## 2016-05-26 ENCOUNTER — Ambulatory Visit (INDEPENDENT_AMBULATORY_CARE_PROVIDER_SITE_OTHER): Payer: Medicare HMO | Admitting: Neurology

## 2016-05-26 ENCOUNTER — Encounter: Payer: Self-pay | Admitting: Neurology

## 2016-05-26 VITALS — BP 127/80 | HR 90 | Ht 62.0 in | Wt 148.8 lb

## 2016-05-26 DIAGNOSIS — I69354 Hemiplegia and hemiparesis following cerebral infarction affecting left non-dominant side: Secondary | ICD-10-CM

## 2016-05-26 NOTE — Progress Notes (Signed)
[facility] [address] [place] [address] [phone]       OFFICE FOLLOW-UP NOTE  Ms. Brynlea Spindler Date of Birth:  1950/05/14 Medical Record Number:  [id number]   HPI: Ms Matteucci is a 62 year Caucasian lady seen today for first office follow-up visit for hospital admission for stroke in April 2016.Asmaa Tirpak is an 66 y.o. female with a history of hypertension and hypothyroidism resenting with new onset weakness involving left side. Patient was last known well at bedtime last night at 10:30 PM. On 07/23/14 She fell when she attempted to get out of bed this morning and was unable to get up from the floor. She had not been on antiplatelet therapy. Is no previous history of stroke nor TIA. Initial laboratory studies showed an elevation in troponin of 1.8.  . NIH stroke score was 10. LSN: 10:30 PM on 07/23/2014 tPA Given: No: Beyond time under for treatmenconsideration MRI scan of the brain showed multifocal patchy acute right MCA territory infarcts with additional 8 mm cortical infarct in the parasagittal right frontal cortical gray matter. MRA of the brain showed diminished flow throughout the visualized right ICA and MCA suggestive of critical proximal stenosis. Carotid ultrasound confirmed this showing greater than 80% proximal right ICA stenosis. Transthoracic echocardiogram was normal. Hemoglobin A1c was elevated at 8.5. LDL cholesterol was elevated at 240, total cholesterol 317, triglycerides 154 and HDL 46 mg percent. Vascular surgery was consulted and patient was initially sent to rehabilitation and subsequently underwent elective right carotid endarterectomy on 08/05/14 successfully by Dr. Juanda Crumble feels. She initially had left hemiparesis as well as neglect and extinction but she made gradual improvement. She's had prolonged outpatient follow-up with therapy and is currently doing well. She still has some significant left hand weakness and involving grip and  intrinsic hand muscles. She is finished physical therapy and at home but cannot afford outpatient therapy as she has lost her health insurance. She can walk independently including stairs and walks a quarter mile every day. She had recent lab work done a few weeks ago which showed significant improvement with hemoglobin A1c coming down to 5.7 and fasting sugars consistently ranging in the 90-100 range here she remains on aspirin which is tolerating well without side effects. She does complain of some pain and tightness when moving her left hand and admits she is not been consistently doing her hand exercises Update 05/28/2015 :. She returns for follow-up after last visit 6 months ago. She continues to do well without recurrent stroke or TIA symptoms. She is tolerating aspirin without significant bleeding but occasional bruising episodes. She states her blood pressure is well controlled today it is 139/82. She had lipid profile checked 3 months ago which was fine and hemoglobin A1c was 6.1. She is quite active and doesn't exercise bike 4 miles per day. She has had regular follow-ups with a vascular surgeon Dr. Oneida Alar and carotid ultrasound done last year was fine. She has no new complaints today but does continue to have persistent weakness and decreased coordination in the left hand  Update 05/26/2016 : She returns for follow-up after last visit a year ago. She continues to have mild weakness in her left hand, decreased cognition as well as gait difficulty due to dragging of the left leg. 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 She has 5 for disability but that decision has not yet been made. She denies  any recurrent stroke or TIA symptoms. She started aspirin well without bleeding or bruising. Her blood pressure is well controlled and today it is 127/80. She is tolerating Lipitor well without side effects. Last lipid profile checked 6 months ago were satisfactory.  Fasting sugars have ranged in the 80-90 range. She eats quite healthy and exercises regularly and rides a stationary bike 2 miles per day. She did have follow-up carotid ultrasound done on 09/03/15 at vascular surgery office and it showed no significant carotid restenosis. She has undergone coming up in June this year. She has no new complaints ROS:   14 system review of systems is positive for    hand weakness, decreased coordination, dragging left leg and all other systems negative  PMH:  Past Medical History:  Diagnosis Date  . Hypertension   . Hypothyroidism   . Stroke [facility])     Social History:  Social History   Social History  . Marital status: Single    Spouse name: N/A  . Number of children: N/A  . Years of education: N/A   Occupational History  . Not on file.   Social History Main Topics  . Smoking status: Former Smoker    Packs/day: 20.00    Types: Cigarettes  . Smokeless tobacco: Never Used     Comment: quit smoking July 24, 2014  . Alcohol use 0.6 oz/week    1 Glasses of wine per week     Comment: ocassionally - wine  . Drug use: No  . Sexual activity: Not Currently   Other Topics Concern  . Not on file   Social History Narrative  . No narrative on file    Medications:   Current Outpatient Prescriptions on File Prior to Visit  Medication Sig Dispense Refill  . ACCU-CHEK FASTCLIX LANCETS MISC Test blood sugar once daily. Dx code: E11.9  100 each 3  . Alcohol Swabs PADS Test blood sugar once daily. Dx code: E11.9 100 each 3  . aspirin 81 MG chewable tablet Chew 1 tablet (81 mg total) by mouth daily.    Marland Kitchen atorvastatin (LIPITOR) 80 MG tablet TAKE 1 TABLET  DAILY 90 tablet 0  . Blood Glucose Calibration (ACCU-CHEK GUIDE CONTROL) LIQD 1 each by In Vitro route daily. Test blood sugar once daily. Dx code: E11.9  1 each 3  . blood glucose meter kit and supplies KIT Dispense based on patient and insurance preference. 1 each 0  . Blood Glucose Monitoring Suppl  (ACCU-CHEK NANO SMARTVIEW) w/Device KIT Test blood sugar once daily. Dx code: E11.9  1 kit 0  . glucose blood (ACCU-CHEK SMARTVIEW) test strip Test blood sugar once daily. Dx code: E11.9  100 each 3  . levothyroxine (SYNTHROID, LEVOTHROID) 88 MCG tablet TAKE 1 TABLET EVERY DAY (NEED MD APPOINTMENT) 30 tablet 0  . lisinopril (PRINIVIL,ZESTRIL) 5 MG tablet TAKE 1 TABLET EVERY DAY 30 tablet 0  . metFORMIN (GLUCOPHAGE) 500 MG tablet TAKE 1 TABLET (500 MG TOTAL) BY MOUTH 2 (TWO) TIMES DAILY WITH A MEAL. 180 tablet 0   No current facility-administered medications on file prior to visit.     Allergies:  No Known Allergies  Physical Exam General: well developed, well nourished pleasant middle-age Caucasian lady, seated, in no evident distress Head: head normocephalic and atraumatic.  Neck: supple with no carotid or supraclavicular bruits Cardiovascular: regular rate and rhythm, no murmurs Musculoskeletal: no deformity Skin:  no rash/petichiae Vascular:  Normal pulses all extremities Vitals:   05/26/16 1343  BP:  127/80  Pulse: 90   Neurologic Exam Mental Status: Awake and fully alert. Oriented to place and time. Recent and remote memory intact. Attention span, concentration and fund of knowledge appropriate. Mood and affect appropriate.  Cranial Nerves: Fundoscopic exam not done . Pupils equal, briskly reactive to light. Extraocular movements full without nystagmus. Visual fields full to confrontation. Hearing intact. Facial sensation intact. Face, tongue, palate moves normally and symmetrically.  Motor: Normal bulk and tone. Normal strength in all tested extremity muscles except weakness of left grip, intrinsic hand muscles and wrist extension. 4/5 strength at the left shoulder and elbows..Mild weakness of left grip. Sensory.: intact to touch ,pinprick .position and vibratory sensation.  Coordination: Rapid alternating movements normal in all extremities. Finger-to-nose and heel-to-shin  performed accurately on the right side. and slightly impaired in the left upper extremity.  Gait and Station: Arises from chair without difficulty. Stance is normal. Gait demonstrates normal stride length and balance but stiffness and dragging of the left leg. . Able to heel, toe and tandem walk with some difficulty.  Reflexes: 1+ and symmetric. Toes downgoing.       ASSESSMENT: 28 year Caucasian lady with a right middle cerebral artery branch infarct in April 2016 secondary to thromboembolism from proximal high-grade right internal carotid artery stenosis for which she subsequently undergone carotid endarterectomy in May 2016 and is currently doing quite well except for left hand persistent weakness and mild spasticity. Vascular risk factors of diabetes, hypertension, hyperlipidemia and extracranial carotid disease    PLAN: I had a long d/w patient about her remote stroke,residual left hemiparesis and her disability, risk for recurrent stroke/TIAs, personally independently reviewed imaging studies and stroke evaluation results and answered questions.Continue aspirin 81 mg daily  for secondary stroke prevention and maintain strict control of hypertension with blood pressure goal below 130/90, diabetes with hemoglobin A1c goal below 6.5% and lipids with LDL cholesterol goal below 70 mg/dL. I also advised the patient to eat a healthy diet with plenty of whole grains, cereals, fruits and vegetables, exercise regularly and maintain ideal body weight . Continue follow-up with vascular surgery for carotid surgery follow-up. Greater than 50% time during this 25 minute visit was spent on counseling about her old stroke, carotid stenosis, recurrent stroke risk and answering questions No routine scheduled follow-up appointment with me is necessary and she may be referred back in the future as needed   Antony Contras, MD Note: This document was prepared with digital dictation and possible smart phrase  technology. Any transcriptional errors that result from this process are unintentional

## 2016-05-26 NOTE — Patient Instructions (Signed)
I had a long d/w patient about her remote stroke,residual left hemiparesis and her disability, risk for recurrent stroke/TIAs, personally independently reviewed imaging studies and stroke evaluation results and answered questions.Continue aspirin 81 mg daily  for secondary stroke prevention and maintain strict control of hypertension with blood pressure goal below 130/90, diabetes with hemoglobin A1c goal below 6.5% and lipids with LDL cholesterol goal below 70 mg/dL. I also advised the patient to eat a healthy diet with plenty of whole grains, cereals, fruits and vegetables, exercise regularly and maintain ideal body weight . Continue follow-up with vascular surgery for carotid surgery follow-up. No routine scheduled follow-up appointment with me is necessary and she may be referred back in the future as needed

## 2016-05-27 ENCOUNTER — Ambulatory Visit: Payer: Commercial Managed Care - HMO | Admitting: Neurology

## 2016-06-05 ENCOUNTER — Other Ambulatory Visit: Payer: Self-pay | Admitting: Physician Assistant

## 2016-06-05 DIAGNOSIS — I10 Essential (primary) hypertension: Secondary | ICD-10-CM

## 2016-06-07 DIAGNOSIS — Z0279 Encounter for issue of other medical certificate: Secondary | ICD-10-CM | POA: Diagnosis not present

## 2016-06-30 ENCOUNTER — Other Ambulatory Visit: Payer: Self-pay | Admitting: Physician Assistant

## 2016-06-30 DIAGNOSIS — E039 Hypothyroidism, unspecified: Secondary | ICD-10-CM

## 2016-07-12 ENCOUNTER — Ambulatory Visit (INDEPENDENT_AMBULATORY_CARE_PROVIDER_SITE_OTHER): Payer: Medicare HMO | Admitting: Physician Assistant

## 2016-07-12 VITALS — BP 148/90 | HR 81 | Temp 98.8°F | Resp 18 | Ht 62.0 in | Wt 153.6 lb

## 2016-07-12 DIAGNOSIS — E034 Atrophy of thyroid (acquired): Secondary | ICD-10-CM | POA: Diagnosis not present

## 2016-07-12 DIAGNOSIS — E119 Type 2 diabetes mellitus without complications: Secondary | ICD-10-CM | POA: Diagnosis not present

## 2016-07-12 DIAGNOSIS — Z1231 Encounter for screening mammogram for malignant neoplasm of breast: Secondary | ICD-10-CM | POA: Diagnosis not present

## 2016-07-12 DIAGNOSIS — Z1239 Encounter for other screening for malignant neoplasm of breast: Secondary | ICD-10-CM

## 2016-07-12 DIAGNOSIS — I10 Essential (primary) hypertension: Secondary | ICD-10-CM

## 2016-07-12 DIAGNOSIS — Z1211 Encounter for screening for malignant neoplasm of colon: Secondary | ICD-10-CM | POA: Diagnosis not present

## 2016-07-12 DIAGNOSIS — E782 Mixed hyperlipidemia: Secondary | ICD-10-CM | POA: Diagnosis not present

## 2016-07-12 MED ORDER — LISINOPRIL 5 MG PO TABS: ORAL_TABLET | ORAL | 1 refills | Status: DC

## 2016-07-12 MED ORDER — LISINOPRIL 10 MG PO TABS
10.0000 mg | ORAL_TABLET | Freq: Every day | ORAL | 1 refills | Status: DC
Start: 2016-07-12 — End: 2017-01-09

## 2016-07-12 MED ORDER — ATORVASTATIN CALCIUM 80 MG PO TABS: ORAL_TABLET | ORAL | 1 refills | Status: DC

## 2016-07-12 MED ORDER — LEVOTHYROXINE SODIUM 88 MCG PO TABS: ORAL_TABLET | ORAL | 1 refills | Status: DC

## 2016-07-12 NOTE — Patient Instructions (Addendum)
Let's follow up in 2-3 weeks with recheck of pressure. We are increasing the lisinopril to 10mg . Please review the dash diet below.   I will contact your with the results of your labs.    DASH Eating Plan DASH stands for "Dietary Approaches to Stop Hypertension." The DASH eating plan is a healthy eating plan that has been shown to reduce high blood pressure (hypertension). It may also reduce your risk for type 2 diabetes, heart disease, and stroke. The DASH eating plan may also help with weight loss. What are tips for following this plan? General guidelines   Avoid eating more than 2,300 mg (milligrams) of salt (sodium) a day. If you have hypertension, you may need to reduce your sodium intake to 1,500 mg a day.  Limit alcohol intake to no more than 1 drink a day for nonpregnant women and 2 drinks a day for men. One drink equals 12 oz of beer, 5 oz of wine, or 1 oz of hard liquor.  Work with your health care provider to maintain a healthy body weight or to lose weight. Ask what an ideal weight is for you.  Get at least 30 minutes of exercise that causes your heart to beat faster (aerobic exercise) most days of the week. Activities may include walking, swimming, or biking.  Work with your health care provider or diet and nutrition specialist (dietitian) to adjust your eating plan to your individual calorie needs. Reading food labels   Check food labels for the amount of sodium per serving. Choose foods with less than 5 percent of the Daily Value of sodium. Generally, foods with less than 300 mg of sodium per serving fit into this eating plan.  To find whole grains, look for the word "whole" as the first word in the ingredient list. Shopping   Buy products labeled as "low-sodium" or "no salt added."  Buy fresh foods. Avoid canned foods and premade or frozen meals. Cooking   Avoid adding salt when cooking. Use salt-free seasonings or herbs instead of table salt or sea salt. Check with  your health care provider or pharmacist before using salt substitutes.  Do not fry foods. Cook foods using healthy methods such as baking, boiling, grilling, and broiling instead.  Cook with heart-healthy oils, such as olive, canola, soybean, or sunflower oil. Meal planning    Eat a balanced diet that includes:  5 or more servings of fruits and vegetables each day. At each meal, try to fill half of your plate with fruits and vegetables.  Up to 6-8 servings of whole grains each day.  Less than 6 oz of lean meat, poultry, or fish each day. A 3-oz serving of meat is about the same size as a deck of cards. One egg equals 1 oz.  2 servings of low-fat dairy each day.  A serving of nuts, seeds, or beans 5 times each week.  Heart-healthy fats. Healthy fats called Omega-3 fatty acids are found in foods such as flaxseeds and coldwater fish, like sardines, salmon, and mackerel.  Limit how much you eat of the following:  Canned or prepackaged foods.  Food that is high in trans fat, such as fried foods.  Food that is high in saturated fat, such as fatty meat.  Sweets, desserts, sugary drinks, and other foods with added sugar.  Full-fat dairy products.  Do not salt foods before eating.  Try to eat at least 2 vegetarian meals each week.  Eat more home-cooked food and less restaurant,  buffet, and fast food.  When eating at a restaurant, ask that your food be prepared with less salt or no salt, if possible. What foods are recommended? The items listed may not be a complete list. Talk with your dietitian about what dietary choices are best for you. Grains  Whole-grain or whole-wheat bread. Whole-grain or whole-wheat pasta. Brown rice. Modena Morrow. Bulgur. Whole-grain and low-sodium cereals. Pita bread. Low-fat, low-sodium crackers. Whole-wheat flour tortillas. Vegetables  Fresh or frozen vegetables (raw, steamed, roasted, or grilled). Low-sodium or reduced-sodium tomato and vegetable  juice. Low-sodium or reduced-sodium tomato sauce and tomato paste. Low-sodium or reduced-sodium canned vegetables. Fruits  All fresh, dried, or frozen fruit. Canned fruit in natural juice (without added sugar). Meat and other protein foods  Skinless chicken or [place]. Ground chicken or [place]. Pork with fat trimmed off. Fish and seafood. Egg whites. Dried beans, peas, or lentils. Unsalted nuts, nut butters, and seeds. Unsalted canned beans. Lean cuts of beef with fat trimmed off. Low-sodium, lean deli meat. Dairy  Low-fat (1%) or fat-free (skim) milk. Fat-free, low-fat, or reduced-fat cheeses. Nonfat, low-sodium ricotta or cottage cheese. Low-fat or nonfat yogurt. Low-fat, low-sodium cheese. Fats and oils  Soft margarine without trans fats. Vegetable oil. Low-fat, reduced-fat, or light mayonnaise and salad dressings (reduced-sodium). Canola, safflower, olive, soybean, and sunflower oils. Avocado. Seasoning and other foods  Herbs. Spices. Seasoning mixes without salt. Unsalted popcorn and pretzels. Fat-free sweets. What foods are not recommended? The items listed may not be a complete list. Talk with your dietitian about what dietary choices are best for you. Grains  Baked goods made with fat, such as croissants, muffins, or some breads. Dry pasta or rice meal packs. Vegetables  Creamed or fried vegetables. Vegetables in a cheese sauce. Regular canned vegetables (not low-sodium or reduced-sodium). Regular canned tomato sauce and paste (not low-sodium or reduced-sodium). Regular tomato and vegetable juice (not low-sodium or reduced-sodium). Angie Fava. Olives. Fruits  Canned fruit in a light or heavy syrup. Fried fruit. Fruit in cream or butter sauce. Meat and other protein foods  Fatty cuts of meat. Ribs. Fried meat. Berniece Salines. Sausage. Bologna and other processed lunch meats. Salami. Fatback. Hotdogs. Bratwurst. Salted nuts and seeds. Canned beans with added salt. Canned or smoked fish. Whole eggs or  egg yolks. Chicken or [place] with skin. Dairy  Whole or 2% milk, cream, and half-and-half. Whole or full-fat cream cheese. Whole-fat or sweetened yogurt. Full-fat cheese. Nondairy creamers. Whipped toppings. Processed cheese and cheese spreads. Fats and oils  Butter. Stick margarine. Lard. Shortening. Ghee. Bacon fat. Tropical oils, such as coconut, palm kernel, or palm oil. Seasoning and other foods  Salted popcorn and pretzels. Onion salt, garlic salt, seasoned salt, table salt, and sea salt. Worcestershire sauce. Tartar sauce. Barbecue sauce. Teriyaki sauce. Soy sauce, including reduced-sodium. Steak sauce. Canned and packaged gravies. Fish sauce. Oyster sauce. Cocktail sauce. Horseradish that you find on the shelf. Ketchup. Mustard. Meat flavorings and tenderizers. Bouillon cubes. Hot sauce and Tabasco sauce. Premade or packaged marinades. Premade or packaged taco seasonings. Relishes. Regular salad dressings. Where to find more information:  National Heart, Lung, and [place]: https://wilson-eaton.com/  American Heart Association: www.heart.org Summary  The DASH eating plan is a healthy eating plan that has been shown to reduce high blood pressure (hypertension). It may also reduce your risk for type 2 diabetes, heart disease, and stroke.  With the DASH eating plan, you should limit salt (sodium) intake to 2,300 mg a day. If you have hypertension, you  may need to reduce your sodium intake to 1,500 mg a day.  When on the DASH eating plan, aim to eat more fresh fruits and vegetables, whole grains, lean proteins, low-fat dairy, and heart-healthy fats.  Work with your health care provider or diet and nutrition specialist (dietitian) to adjust your eating plan to your individual calorie needs. This information is not intended to replace advice given to you by your health care provider. Make sure you discuss any questions you have with your health care provider. Document Released: 03/03/2011  Document Revised: 03/07/2016 Document Reviewed: 03/07/2016 Elsevier Interactive Patient Education  2017 [employer].    IF you received an x-ray today, you will receive an invoice from [facility]. Please contact [facility] at [phone] with questions or concerns regarding your invoice.   IF you received labwork today, you will receive an invoice from [facility]. Please contact [facility] at [phone] with questions or concerns regarding your invoice.   Our billing staff will not be able to assist you with questions regarding bills from these companies.  You will be contacted with the lab results as soon as they are available. The fastest way to get your results is to activate your My Chart account. Instructions are located on the last page of this paperwork. If you have not heard from [place] regarding the results in 2 weeks, please contact this office.

## 2016-07-12 NOTE — Progress Notes (Signed)
PRIMARY CARE AT [facility] [address], [place] [address] [phone]  Date:  07/12/2016   Name:  Carla Hopkins   DOB:  November 12, 1950   MRN:  [id number]  PCP:  Ivar Drape, PA    History of Present Illness:  Carla Hopkins is a 66 y.o. female patient who presents to PCP with  Chief Complaint  Patient presents with  . Medication Refill    Lipitor, Levothyroxine sodium, Lisinopril     HTN/Lipid: She is taking her medication.  No chest pains, sob, leg swellings, or vision changes.  No side effects to note.  No urinary symptoms.  She is eating full meals.  Vegetables daily.  No fried foods.  Occasional diet gingerale.    Hypothyroidism: she is compliant on the levothyroxine.  She ran out 1 week ago.  She has not noticed added fatigue, constipation, change in skin.    Patient is due to renew her disability.  She has no use of her hands at this time.  She was seen 6-8 months ago.   She has no overhead use, or able to pick up.    Wt Readings from Last 3 Encounters:  07/12/16 153 lb 9.6 oz (69.7 kg)  05/26/16 148 lb 12.8 oz (67.5 kg)  09/25/15 146 lb (66.2 kg)     Patient Active Problem List   Diagnosis Date Noted  . History of CVA with residual deficit 12/30/2014  . Left hemiparesis ([place]) 10/13/2014  . Left-sided neglect 10/13/2014  . History of right MCA stroke 08/06/2014  . Carotid stenosis 08/05/2014  . Acute right MCA stroke ([place]) 07/30/2014  . Cerebral infarction due to thrombosis of right middle cerebral artery ([place])   . Thyroid activity decreased   . Left-sided weakness   . Stroke ([place])   . Essential hypertension   . Carotid artery stenosis   . CVA (cerebral infarction) 07/24/2014  . Hyperlipidemia 07/24/2014  . Diabetes ([place]) 07/24/2014  . Elevated troponin 07/24/2014  . Tobacco abuse 07/24/2014  . Benign essential HTN 07/23/2014  . Obstructive sleep apnea 07/23/2014  . Hypothyroidism 07/03/2014  . Type II or unspecified type diabetes mellitus without  mention of complication, not stated as uncontrolled 09/25/2013  . BMI 38.0-38.9,adult 05/19/2013  . Nicotine addiction 05/19/2013  . Right atrial enlargement 05/19/2013    Past Medical History:  Diagnosis Date  . Hypertension   . Hypothyroidism   . Stroke [facility])     Past Surgical History:  Procedure Laterality Date  . ENDARTERECTOMY Right 08/05/2014   Procedure: Right Carotid ENDARTERECTOMY ;  Surgeon: Elam Dutch, MD;  Location: [place];  Service: Vascular;  Laterality: Right;  . PATCH ANGIOPLASTY Right 08/05/2014   Procedure: PATCH ANGIOPLASTY;  Surgeon: Elam Dutch, MD;  Location: [place];  Service: Vascular;  Laterality: Right;  . TUBAL LIGATION      Social History  Substance Use Topics  . Smoking status: Former Smoker    Packs/day: 20.00    Types: Cigarettes  . Smokeless tobacco: Never Used     Comment: quit smoking July 24, 2014  . Alcohol use 0.6 oz/week    1 Glasses of wine per week     Comment: ocassionally - wine    Family History  Problem Relation Age of Onset  . Adopted: Yes    No Known Allergies  Medication list has been reviewed and updated.  Current Outpatient Prescriptions on File Prior to Visit  Medication Sig Dispense Refill  . ACCU-CHEK FASTCLIX LANCETS MISC  Test blood sugar once daily. Dx code: E11.9  100 each 3  . Alcohol Swabs PADS Test blood sugar once daily. Dx code: E11.9 100 each 3  . aspirin 81 MG chewable tablet Chew 1 tablet (81 mg total) by mouth daily.    Marland Kitchen atorvastatin (LIPITOR) 80 MG tablet TAKE 1 TABLET EVERY DAY (NEED MD APPOINTMENT) 30 tablet 0  . Blood Glucose Calibration (ACCU-CHEK GUIDE CONTROL) LIQD 1 each by In Vitro route daily. Test blood sugar once daily. Dx code: E11.9  1 each 3  . blood glucose meter kit and supplies KIT Dispense based on patient and insurance preference. 1 each 0  . Blood Glucose Monitoring Suppl (ACCU-CHEK NANO SMARTVIEW) w/Device KIT Test blood sugar once daily. Dx code: E11.9  1 kit 0  .  glucose blood (ACCU-CHEK SMARTVIEW) test strip Test blood sugar once daily. Dx code: E11.9  100 each 3  . levothyroxine (SYNTHROID, LEVOTHROID) 88 MCG tablet TAKE 1 TABLET EVERY DAY (NEED MD APPOINTMENT) 30 tablet 0  . lisinopril (PRINIVIL,ZESTRIL) 5 MG tablet TAKE 1 TABLET EVERY DAY  ( APPOINTMENT IS NEEDED  ) 30 tablet 0  . metFORMIN (GLUCOPHAGE) 500 MG tablet TAKE 1 TABLET (500 MG TOTAL) BY MOUTH 2 (TWO) TIMES DAILY WITH A MEAL. 180 tablet 0   No current facility-administered medications on file prior to visit.     ROS ROS otherwise unremarkable unless listed above.  Physical Examination: BP (!) 172/102   Pulse 81   Temp 98.8 F (37.1 C) (Oral)   Resp 18   Ht '5\' 2"'  (1.575 m)   Wt 153 lb 9.6 oz (69.7 kg)   SpO2 95%   BMI 28.09 kg/m  Ideal Body Weight: Weight in (lb) to have BMI = 25: 136.4  Physical Exam  Constitutional: She is oriented to person, place, and time. She appears well-developed and well-nourished. No distress.  HENT:  Head: Normocephalic and atraumatic.  Right Ear: External ear normal.  Left Ear: External ear normal.  Eyes: Conjunctivae and EOM are normal. Pupils are equal, round, and reactive to light.  Cardiovascular: Normal rate and regular rhythm.  Exam reveals no gallop and no friction rub.   Murmur heard. Pulses:      Radial pulses are 2+ on the right side, and 2+ on the left side.       Dorsalis pedis pulses are 2+ on the right side, and 2+ on the left side.  Pulmonary/Chest: Effort normal. No apnea. No respiratory distress. She has no decreased breath sounds. She has no wheezes.  Feet:  Right Foot:  Protective Sensation: 6 sites tested. 6 sites sensed.  Skin Integrity: Negative for ulcer or blister.  Left Foot:  Protective Sensation: 6 sites tested. 6 sites sensed.  Skin Integrity: Negative for ulcer or blister.  Neurological: She is alert and oriented to person, place, and time.  Skin: She is not diaphoretic.  Psychiatric: She has a normal mood  and affect. Her behavior is normal.     Assessment and Plan: Carla Hopkins is a 66 y.o. female who is here today for cc of medication refill, and recheck of cholesterol, diabetes, and blood pressure.  --thyroid function rechecked, though she has not taken medication for 1 week.  --recheck thyroid function --she declines colonoscopy.  She is willing to do the cologard at this time.  This form was  Completed and insurance information attached by Mariann Laster.   --plans to have opthalmology exam in the next few months. --filling for 6  months.  rtc in 6 months. Type 2 diabetes mellitus without complication, without long-term current use of insulin ([place]) - Plan: Hemoglobin A1c, CMP14+EGFR  Mixed hyperlipidemia - Plan: Lipid panel  Hypothyroidism due to acquired atrophy of thyroid - Plan: TSH, levothyroxine (SYNTHROID, LEVOTHROID) 88 MCG tablet  Screening for colon cancer - Plan: Cologuard  Screening for breast cancer - Plan: MM DIGITAL SCREENING BILATERAL  Benign essential HTN - Plan: lisinopril (PRINIVIL,ZESTRIL) 5 MG tablet  Ivar Drape, PA-C [facility] 4/17/201812:08 PM

## 2016-07-13 LAB — CMP14+EGFR
A/G RATIO: 1.4 (ref 1.2–2.2)
ALT: 27 IU/L (ref 0–32)
AST: 21 IU/L (ref 0–40)
Albumin: 4.1 g/dL (ref 3.6–4.8)
Alkaline Phosphatase: 48 IU/L (ref 39–117)
BUN/Creatinine Ratio: 14 (ref 12–28)
BUN: 16 mg/dL (ref 8–27)
Bilirubin Total: 0.4 mg/dL (ref 0.0–1.2)
CO2: 20 mmol/L (ref 18–29)
Calcium: 9.2 mg/dL (ref 8.7–10.3)
Chloride: 107 mmol/L — ABNORMAL HIGH (ref 96–106)
Creatinine, Ser: 1.12 mg/dL — ABNORMAL HIGH (ref 0.57–1.00)
GFR, EST AFRICAN AMERICAN: 59 mL/min/{1.73_m2} — AB (ref >59)
GFR, EST NON AFRICAN AMERICAN: 51 mL/min/{1.73_m2} — AB (ref >59)
Globulin, Total: 3 g/dL (ref 1.5–4.5)
Glucose: 117 mg/dL — ABNORMAL HIGH (ref 65–99)
POTASSIUM: 4.7 mmol/L (ref 3.5–5.2)
Sodium: 144 mmol/L (ref 134–144)
TOTAL PROTEIN: 7.1 g/dL (ref 6.0–8.5)

## 2016-07-13 LAB — LIPID PANEL
CHOL/HDL RATIO: 3.9 ratio (ref 0.0–4.4)
Cholesterol, Total: 161 mg/dL (ref 100–199)
HDL: 41 mg/dL (ref >39)
LDL Calculated: 95 mg/dL (ref 0–99)
Triglycerides: 126 mg/dL (ref 0–149)
VLDL CHOLESTEROL CAL: 25 mg/dL (ref 5–40)

## 2016-07-13 LAB — TSH: TSH: 1.71 u[IU]/mL (ref 0.450–4.500)

## 2016-07-13 LAB — HEMOGLOBIN A1C
Est. average glucose Bld gHb Est-mCnc: 117 mg/dL
Hgb A1c MFr Bld: 5.7 % — ABNORMAL HIGH (ref 4.8–5.6)

## 2016-07-25 DIAGNOSIS — Z1211 Encounter for screening for malignant neoplasm of colon: Secondary | ICD-10-CM | POA: Diagnosis not present

## 2016-07-25 DIAGNOSIS — Z1212 Encounter for screening for malignant neoplasm of rectum: Secondary | ICD-10-CM | POA: Diagnosis not present

## 2016-07-25 LAB — COLOGUARD: Cologuard: POSITIVE

## 2016-08-18 ENCOUNTER — Telehealth: Payer: Self-pay

## 2016-08-18 NOTE — Telephone Encounter (Signed)
Called pt to schedule Medicare Annual Wellness Visit. -nr  

## 2016-08-24 ENCOUNTER — Ambulatory Visit (INDEPENDENT_AMBULATORY_CARE_PROVIDER_SITE_OTHER): Payer: Medicare HMO | Admitting: Physician Assistant

## 2016-08-24 ENCOUNTER — Encounter: Payer: Self-pay | Admitting: Physician Assistant

## 2016-08-24 VITALS — BP 146/98 | HR 98 | Temp 98.2°F | Resp 18 | Ht 62.0 in | Wt 153.2 lb

## 2016-08-24 DIAGNOSIS — R195 Other fecal abnormalities: Secondary | ICD-10-CM | POA: Diagnosis not present

## 2016-08-24 DIAGNOSIS — Z0000 Encounter for general adult medical examination without abnormal findings: Secondary | ICD-10-CM

## 2016-08-24 DIAGNOSIS — Z23 Encounter for immunization: Secondary | ICD-10-CM | POA: Diagnosis not present

## 2016-08-24 NOTE — Progress Notes (Signed)
Subjective:    Carla Hopkins is a 66 y.o. female who presents for Medicare Annual preventive examination.  Preventive Screening-Counseling & Management  Tobacco History  Smoking Status  . Former Smoker  . Packs/day: 20.00  . Types: Cigarettes  Smokeless Tobacco  . Never Used    Comment: quit smoking July 24, 2014      Current Problems (verified) Patient Active Problem List   Diagnosis Date Noted  . History of CVA with residual deficit 12/30/2014  . Left hemiparesis ([place]) 10/13/2014  . Left-sided neglect 10/13/2014  . History of right MCA stroke 08/06/2014  . Carotid stenosis 08/05/2014  . Acute right MCA stroke ([place]) 07/30/2014  . Cerebral infarction due to thrombosis of right middle cerebral artery ([place])   . Thyroid activity decreased   . Left-sided weakness   . Stroke ([place])   . Essential hypertension   . Carotid artery stenosis   . CVA (cerebral infarction) 07/24/2014  . Hyperlipidemia 07/24/2014  . Diabetes ([place]) 07/24/2014  . Elevated troponin 07/24/2014  . Tobacco abuse 07/24/2014  . Benign essential HTN 07/23/2014  . Obstructive sleep apnea 07/23/2014  . Hypothyroidism 07/03/2014  . Type II or unspecified type diabetes mellitus without mention of complication, not stated as uncontrolled 09/25/2013  . BMI 38.0-38.9,adult 05/19/2013  . Nicotine addiction 05/19/2013  . Right atrial enlargement 05/19/2013    Medications Prior to Visit Current Outpatient Prescriptions on File Prior to Visit  Medication Sig Dispense Refill  . aspirin 81 MG chewable tablet Chew 1 tablet (81 mg total) by mouth daily.    Marland Kitchen atorvastatin (LIPITOR) 80 MG tablet TAKE 1 TABLET EVERY DAY 90 tablet 1  . levothyroxine (SYNTHROID, LEVOTHROID) 88 MCG tablet TAKE 1 TABLET EVERY DAY 90 tablet 1  . lisinopril (PRINIVIL,ZESTRIL) 10 MG tablet Take 1 tablet (10 mg total) by mouth daily. 90 tablet 1  . metFORMIN (GLUCOPHAGE) 500 MG tablet TAKE 1 TABLET (500 MG TOTAL) BY MOUTH 2 (TWO) TIMES DAILY  WITH A MEAL. 180 tablet 0  . ACCU-CHEK FASTCLIX LANCETS MISC Test blood sugar once daily. Dx code: E11.9  (Patient not taking: Reported on 08/24/2016) 100 each 3  . Blood Glucose Calibration (ACCU-CHEK GUIDE CONTROL) LIQD 1 each by In Vitro route daily. Test blood sugar once daily. Dx code: E11.9  (Patient not taking: Reported on 08/24/2016) 1 each 3  . blood glucose meter kit and supplies KIT Dispense based on patient and insurance preference. (Patient not taking: Reported on 08/24/2016) 1 each 0  . Blood Glucose Monitoring Suppl (ACCU-CHEK NANO SMARTVIEW) w/Device KIT Test blood sugar once daily. Dx code: E11.9  (Patient not taking: Reported on 08/24/2016) 1 kit 0  . glucose blood (ACCU-CHEK SMARTVIEW) test strip Test blood sugar once daily. Dx code: E11.9  (Patient not taking: Reported on 08/24/2016) 100 each 3   No current facility-administered medications on file prior to visit.     Current Medications (verified) Current Outpatient Prescriptions  Medication Sig Dispense Refill  . aspirin 81 MG chewable tablet Chew 1 tablet (81 mg total) by mouth daily.    Marland Kitchen atorvastatin (LIPITOR) 80 MG tablet TAKE 1 TABLET EVERY DAY 90 tablet 1  . levothyroxine (SYNTHROID, LEVOTHROID) 88 MCG tablet TAKE 1 TABLET EVERY DAY 90 tablet 1  . lisinopril (PRINIVIL,ZESTRIL) 10 MG tablet Take 1 tablet (10 mg total) by mouth daily. 90 tablet 1  . metFORMIN (GLUCOPHAGE) 500 MG tablet TAKE 1 TABLET (500 MG TOTAL) BY MOUTH 2 (TWO) TIMES DAILY WITH A MEAL. [place]  tablet 0  . ACCU-CHEK FASTCLIX LANCETS MISC Test blood sugar once daily. Dx code: E11.9  (Patient not taking: Reported on 08/24/2016) 100 each 3  . Blood Glucose Calibration (ACCU-CHEK GUIDE CONTROL) LIQD 1 each by In Vitro route daily. Test blood sugar once daily. Dx code: E11.9  (Patient not taking: Reported on 08/24/2016) 1 each 3  . blood glucose meter kit and supplies KIT Dispense based on patient and insurance preference. (Patient not taking: Reported on  08/24/2016) 1 each 0  . Blood Glucose Monitoring Suppl (ACCU-CHEK NANO SMARTVIEW) w/Device KIT Test blood sugar once daily. Dx code: E11.9  (Patient not taking: Reported on 08/24/2016) 1 kit 0  . glucose blood (ACCU-CHEK SMARTVIEW) test strip Test blood sugar once daily. Dx code: E11.9  (Patient not taking: Reported on 08/24/2016) 100 each 3   No current facility-administered medications for this visit.      Allergies (verified) Patient has no known allergies.   PAST HISTORY  Family History Family History  Problem Relation Age of Onset  . Adopted: Yes    Social History Social History  Substance Use Topics  . Smoking status: Former Smoker    Packs/day: 20.00    Types: Cigarettes  . Smokeless tobacco: Never Used     Comment: quit smoking July 24, 2014  . Alcohol use 0.6 oz/week    1 Glasses of wine per week     Comment: ocassionally - wine     Are there smokers in your home (other than you)? No  Risk Factors Current exercise habits: Home exercise routine includes exercise bike 2-3 miles every other day.  Dietary issues discussed: diabetes, following a diabetic diet.   Cardiac risk factors: diabetes, hypertension, hx of stroke.  Depression Screen (Note: if answer to either of the following is "Yes", a more complete depression screening is indicated)   Over the past two weeks, have you felt down, depressed or hopeless? No  Over the past two weeks, have you felt little interest or pleasure in doing things? No  Have you lost interest or pleasure in daily life? No  Do you often feel hopeless? No  Do you cry easily over simple problems? No  Activities of Daily Living In your present state of health, do you have any difficulty performing the following activities?:  Driving? Yes Managing money?  Yes Feeding yourself? Yes Getting from bed to chair? YesBP (!) 146/98   Pulse 98   Temp 98.2 F (36.8 C) (Oral)   Resp 18   Ht _0  (1.575 m)   Wt 153 lb 3.2 oz (69.5 kg)    SpO2 96%   BMI 28.02 kg/m   Climbing a flight of stairs? Yes Preparing food and eating?: Yes Bathing or showering? Yes Getting dressed: Yes Getting to the toilet? Yes Using the toilet:Yes Moving around from place to place: No In the past year have you fallen or had a near fall?:No   Are you sexually active?  No  Do you have more than one partner?  No  Hearing Difficulties: No Do you often ask people to speak up or repeat themselves? No Do you experience ringing or noises in your ears? No Do you have difficulty understanding soft or whispered voices? No   Do you feel that you have a problem with memory? No  Do you often misplace items? No  Do you feel safe at home?  Yes  Cognitive Testing  Alert? Yes  Normal Appearance?Yes  Oriented to person? Yes  Place? Yes   Time? Yes  Recall of three objects?  Yes  Can perform simple calculations? Yes  Displays appropriate judgment?Yes  Can read the correct time from a watch face?Yes   Advanced Directives have been discussed with the patient? Yes  List the Names of Other Physician/Practitioners you currently use: 1.    Indicate any recent Medical Services you may have received from other than [facility] providers in the past year (date may be approximate).  Immunization History  Administered Date(s) Administered  . Pneumococcal Polysaccharide-23 07/31/2014    Screening Tests Health Maintenance  Topic Date Due  . Hepatitis C Screening  [birth year]  . TETANUS/TDAP  12/29/1968  . MAMMOGRAM  12/30/1999  . COLONOSCOPY  12/30/1999  . DEXA SCAN  12/30/2014  . PNA vac Low Risk Adult (1 of 2 - PCV13) 07/31/2015  . OPHTHALMOLOGY EXAM  12/03/2015  . INFLUENZA VACCINE  10/26/2016  . HEMOGLOBIN A1C  01/11/2017  . FOOT EXAM  07/13/2017    All answers were reviewed with the patient and necessary referrals were made:  Ivar Drape, PA   08/24/2016   History reviewed: allergies, current medications, past family history, past medical  history, past social history, past surgical history and problem list  Review of Systems Pertinent items are noted in HPI.    Objective:    Visual Acuity Screening   Right eye Left eye Both eyes  Without correction:     With correction: _0    Body mass index is 28.02 kg/m. BP (!) 146/98   Pulse 98   Temp 98.2 F (36.8 C) (Oral)   Resp 18   Ht _1  (1.575 m)   Wt 153 lb 3.2 oz (69.5 kg)   SpO2 96%   BMI 28.02 kg/m     Physical Exam  Constitutional: She is oriented to person, place, and time. She appears well-developed and well-nourished. No distress.  HENT:  Head: Normocephalic and atraumatic.  Eyes: Conjunctivae and EOM are normal. Pupils are equal, round, and reactive to light.  Cardiovascular: Normal rate.   Pulmonary/Chest: Effort normal. No respiratory distress.  Neurological: She is alert and oriented to person, place, and time.  Skin: She is not diaphoretic.  Psychiatric: She has a normal mood and affect. Her behavior is normal.   Assessment:   Discussed positive cologuard.  Will obtain diagnostic coloscopy at this time. No family hx as patient was adopted Declines strep pneumo  Positive colorectal cancer screening using Cologuard test - Plan: Ambulatory referral to Gastroenterology  Need for prophylactic vaccination against Streptococcus pneumoniae (pneumococcus) - Plan: Pneumococcal conjugate vaccine 13-valent IM  Medicare annual wellness visit, initial  Ivar Drape, PA-C [facility] 6/1/201810:37 AM    Diet review for nutrition referral? Yes ____  Not Indicated ____   Patient Instructions (the written plan) was given to the patient.  Medicare Attestation I have personally reviewed: The patient's medical and social history Their use of alcohol, tobacco or illicit drugs Their current medications and supplements The patient's functional ability including ADLs,fall risks, home  safety risks, cognitive, and hearing and visual impairment Diet and physical activities Evidence for depression or mood disorders  The patient's weight, height, BMI, and visual acuity have been recorded in the chart.  I have made referrals, counseling, and provided education to the patient based on review of the above and I have provided the patient with a written personalized care plan for preventive services.  [place], [place]   08/24/2016

## 2016-08-24 NOTE — Patient Instructions (Signed)
     IF you received an x-ray today, you will receive an invoice from [facility]. Please contact [facility] at [phone] with questions or concerns regarding your invoice.   IF you received labwork today, you will receive an invoice from [facility]. Please contact [facility] at [phone] with questions or concerns regarding your invoice.   Our billing staff will not be able to assist you with questions regarding bills from these companies.  You will be contacted with the lab results as soon as they are available. The fastest way to get your results is to activate your My Chart account. Instructions are located on the last page of this paperwork. If you have not heard from us regarding the results in 2 weeks, please contact this office.     

## 2016-09-01 ENCOUNTER — Ambulatory Visit
Admission: RE | Admit: 2016-09-01 | Discharge: 2016-09-01 | Disposition: A | Discharge status: Home / Self Care | Payer: Commercial Managed Care - HMO | Source: Ambulatory Visit | Attending: Physician Assistant | Admitting: Physician Assistant

## 2016-09-01 ENCOUNTER — Encounter: Payer: Self-pay | Admitting: Family

## 2016-09-01 DIAGNOSIS — Z1231 Encounter for screening mammogram for malignant neoplasm of breast: Secondary | ICD-10-CM | POA: Diagnosis not present

## 2016-09-01 DIAGNOSIS — Z1239 Encounter for other screening for malignant neoplasm of breast: Secondary | ICD-10-CM

## 2016-09-06 ENCOUNTER — Other Ambulatory Visit: Payer: Self-pay

## 2016-09-06 DIAGNOSIS — I6529 Occlusion and stenosis of unspecified carotid artery: Secondary | ICD-10-CM

## 2016-09-08 ENCOUNTER — Encounter: Payer: Self-pay | Admitting: Family

## 2016-09-08 ENCOUNTER — Ambulatory Visit (HOSPITAL_COMMUNITY)
Admission: RE | Admit: 2016-09-08 | Discharge: 2016-09-08 | Disposition: A | Discharge status: Home / Self Care | Payer: Medicare HMO | Source: Ambulatory Visit | Attending: Family | Admitting: Family

## 2016-09-08 ENCOUNTER — Ambulatory Visit (INDEPENDENT_AMBULATORY_CARE_PROVIDER_SITE_OTHER): Payer: Medicare HMO | Admitting: Family

## 2016-09-08 VITALS — BP 134/87 | HR 90 | Temp 98.4°F | Resp 20 | Ht 62.0 in | Wt 152.0 lb

## 2016-09-08 DIAGNOSIS — Z9889 Other specified postprocedural states: Secondary | ICD-10-CM | POA: Diagnosis not present

## 2016-09-08 DIAGNOSIS — I6521 Occlusion and stenosis of right carotid artery: Secondary | ICD-10-CM | POA: Diagnosis not present

## 2016-09-08 DIAGNOSIS — I6529 Occlusion and stenosis of unspecified carotid artery: Secondary | ICD-10-CM

## 2016-09-08 DIAGNOSIS — Z8673 Personal history of transient ischemic attack (TIA), and cerebral infarction without residual deficits: Secondary | ICD-10-CM

## 2016-09-08 DIAGNOSIS — I6523 Occlusion and stenosis of bilateral carotid arteries: Secondary | ICD-10-CM | POA: Diagnosis not present

## 2016-09-08 LAB — VAS US CAROTID
LCCAPSYS: 89 cm/s
LEFT ECA DIAS: -20 cm/s
LEFT VERTEBRAL DIAS: -16 cm/s
LICADSYS: -68 cm/s
Left CCA dist dias: -17 cm/s
Left CCA dist sys: -80 cm/s
Left CCA prox dias: 21 cm/s
Left ICA dist dias: -24 cm/s
Left ICA prox dias: -22 cm/s
Left ICA prox sys: -63 cm/s
RCCADSYS: -56 cm/s
RCCAPDIAS: 12 cm/s
RIGHT CCA MID DIAS: 16 cm/s
RIGHT ECA DIAS: -9 cm/s
Right CCA prox sys: 99 cm/s

## 2016-09-08 NOTE — Progress Notes (Signed)
Chief Complaint: Follow up Extracranial Carotid Artery Stenosis   History of Present Illness  Carla Hopkins is a 66 y.o. female who returns for follow-up today. She previously had a stroke to the right brain in April 2016. She subsequently underwent right carotid endarterectomy in May 2016 by Dr. Oneida Alar. She has had no further events since then. She currently is on aspirin. She still has some residual left upper extremity weakness. She is also followed by Dr. Leonie Man with neurology. Other chronic medical problem hypertension which is currently stable.  Review of systems: She denies shortness of breath. She denies chest pain. She denies claudication symptoms with walking.  She uses her stationary bike for the equivalent of 3 miles daily.  Pt Diabetic: yes, A1C was 5.7 in April 2018 (review of records) Pt smoker: former smoker, quit April 2016  Pt meds include: Statin : yes ASA: yes Other anticoagulants/antiplatelets: no     Past Medical History:  Diagnosis Date  . Hypertension   . Hypothyroidism   . Stroke [facility])     Social History Social History  Substance Use Topics  . Smoking status: Former Smoker    Packs/day: 20.00    Types: Cigarettes  . Smokeless tobacco: Never Used     Comment: quit smoking July 24, 2014  . Alcohol use 0.6 oz/week    1 Glasses of wine per week     Comment: ocassionally - wine    Family History Family History  Problem Relation Age of Onset  . Adopted: Yes    Surgical History Past Surgical History:  Procedure Laterality Date  . ENDARTERECTOMY Right 08/05/2014   Procedure: Right Carotid ENDARTERECTOMY ;  Surgeon: Elam Dutch, MD;  Location: [place];  Service: Vascular;  Laterality: Right;  . PATCH ANGIOPLASTY Right 08/05/2014   Procedure: PATCH ANGIOPLASTY;  Surgeon: Elam Dutch, MD;  Location: [place];  Service: Vascular;  Laterality: Right;  . TUBAL LIGATION      No Known Allergies  Current Outpatient Prescriptions   Medication Sig Dispense Refill  . aspirin 81 MG chewable tablet Chew 1 tablet (81 mg total) by mouth daily.    Marland Kitchen atorvastatin (LIPITOR) 80 MG tablet TAKE 1 TABLET EVERY DAY 90 tablet 1  . levothyroxine (SYNTHROID, LEVOTHROID) 88 MCG tablet TAKE 1 TABLET EVERY DAY 90 tablet 1  . lisinopril (PRINIVIL,ZESTRIL) 10 MG tablet Take 1 tablet (10 mg total) by mouth daily. 90 tablet 1   No current facility-administered medications for this visit.     Review of Systems : See HPI for pertinent positives and negatives.  Physical Examination  Vitals:   09/08/16 1129 09/08/16 1140  BP: (!) 137/93 134/87  Pulse: 90   Resp: 20   Temp: 98.4 F (36.9 C)   TempSrc: Oral   SpO2: 97%   Weight: 152 lb (68.9 kg)   Height: 5\' 2"  (1.575 m)    Body mass index is 27.8 kg/m.  General: WDWN female in NAD GAIT: normal Eyes: PERRLA Pulmonary:  Respirations are non-labored, CTAB  Cardiac: regular rhythm, no detected murmur.  VASCULAR EXAM Carotid Bruits Right Left   Negative Negative    Aorta is not palpable. Radial pulses are 2+ palpable and =.  LE Pulses Right Left       POPLITEAL  not palpable   not palpable       POSTERIOR TIBIAL   palpable    palpable        DORSALIS PEDIS      ANTERIOR TIBIAL  palpable   palpable     Gastrointestinal: soft, nontender, BS WNL, no r/g, no palpable masses.  Musculoskeletal: no muscle atrophy/wasting. M/S 5/5 throughout except 3/5 in left UE, 4/5 in left LE, extremities without ischemic changes.  Neurologic: A&O X 3; Appropriate Affect, speech is normal, CN 2-12 intact, pain and light touch intact in extremities, Motor exam as listed above.      Assessment: Carla Hopkins is a 66 y.o. female who is s/p right carotid endarterectomy in May 2016. She had a preoperative stroke and has moderate residual left  upper extremity weakness. She has had no subsequent stroke or TIA.  DATA (09/08/16): Carotid Duplex: <40% bilateral ICA stenoses. Bilateral vertebral artery flow is antegrade.  No significant change compared to exams on 02-26-15 and 09-03-15.    Plan: Follow-up in 1 year with Carotid Duplex scan.    I discussed in depth with the patient the nature of atherosclerosis, and emphasized the importance of maximal medical management including strict control of blood pressure, blood glucose, and lipid levels, obtaining regular exercise, and continued cessation of smoking.  The patient is aware that without maximal medical management the underlying atherosclerotic disease process will progress, limiting the benefit of any interventions. The patient was given information about stroke prevention and what symptoms should prompt the patient to seek immediate medical care. Thank you for allowing [place] to participate in this patient's care.  Clemon Chambers, RN, MSN, FNP-C [facility] Office: [phone]  Clinic Physician: Donzetta Matters on call  09/08/16 12:09 PM

## 2016-09-08 NOTE — Patient Instructions (Signed)
Stroke Prevention Some medical conditions and behaviors are associated with an increased chance of having a stroke. You may prevent a stroke by making healthy choices and managing medical conditions. How can I reduce my risk of having a stroke?  Stay physically active. Get at least 30 minutes of activity on most or all days.  Do not smoke. It may also be helpful to avoid exposure to secondhand smoke.  Limit alcohol use. Moderate alcohol use is considered to be: ? No more than 2 drinks per day for men. ? No more than 1 drink per day for nonpregnant women.  Eat healthy foods. This involves: ? Eating 5 or more servings of fruits and vegetables a day. ? Making dietary changes that address high blood pressure (hypertension), high cholesterol, diabetes, or obesity.  Manage your cholesterol levels. ? Making food choices that are high in fiber and low in saturated fat, trans fat, and cholesterol may control cholesterol levels. ? Take any prescribed medicines to control cholesterol as directed by your health care provider.  Manage your diabetes. ? Controlling your carbohydrate and sugar intake is recommended to manage diabetes. ? Take any prescribed medicines to control diabetes as directed by your health care provider.  Control your hypertension. ? Making food choices that are low in salt (sodium), saturated fat, trans fat, and cholesterol is recommended to manage hypertension. ? Ask your health care provider if you need treatment to lower your blood pressure. Take any prescribed medicines to control hypertension as directed by your health care provider. ? If you are 18-39 years of age, have your blood pressure checked every 3-5 years. If you are 40 years of age or older, have your blood pressure checked every year.  Maintain a healthy weight. ? Reducing calorie intake and making food choices that are low in sodium, saturated fat, trans fat, and cholesterol are recommended to manage  weight.  Stop drug abuse.  Avoid taking birth control pills. ? Talk to your health care provider about the risks of taking birth control pills if you are over 35 years old, smoke, get migraines, or have ever had a blood clot.  Get evaluated for sleep disorders (sleep apnea). ? Talk to your health care provider about getting a sleep evaluation if you snore a lot or have excessive sleepiness.  Take medicines only as directed by your health care provider. ? For some people, aspirin or blood thinners (anticoagulants) are helpful in reducing the risk of forming abnormal blood clots that can lead to stroke. If you have the irregular heart rhythm of atrial fibrillation, you should be on a blood thinner unless there is a good reason you cannot take them. ? Understand all your medicine instructions.  Make sure that other conditions (such as anemia or atherosclerosis) are addressed. Get help right away if:  You have sudden weakness or numbness of the face, arm, or leg, especially on one side of the body.  Your face or eyelid droops to one side.  You have sudden confusion.  You have trouble speaking (aphasia) or understanding.  You have sudden trouble seeing in one or both eyes.  You have sudden trouble walking.  You have dizziness.  You have a loss of balance or coordination.  You have a sudden, severe headache with no known cause.  You have new chest pain or an irregular heartbeat. Any of these symptoms may represent a serious problem that is an emergency. Do not wait to see if the symptoms will go away.   Get medical help at once. Call your local emergency services (911 in [place] Do not drive yourself to the hospital. This information is not intended to replace advice given to you by your health care provider. Make sure you discuss any questions you have with your health care provider. Document Released: 04/21/2004 Document Revised: 08/20/2015 Document Reviewed: 09/14/2012 Elsevier  Interactive Patient Education  2017 [employer].     Preventing Cerebrovascular Disease Arteries are blood vessels that carry blood that contains oxygen from the heart to all parts of the body. Cerebrovascular disease affects arteries that supply the brain. Any condition that blocks or disrupts blood flow to the brain can cause cerebrovascular disease. Brain cells that lose blood supply start to die within minutes (stroke). Stroke is the main danger of cerebrovascular disease. Atherosclerosis and high blood pressure are common causes of cerebrovascular disease. Atherosclerosis is narrowing and hardening of an artery that results when fat, cholesterol, calcium, or other substances (plaque) build up inside an artery. Plaque reduces blood flow through the artery. High blood pressure increases the risk of bleeding inside the brain. Making diet and lifestyle changes to prevent atherosclerosis and high blood pressure lowers your risk of cerebrovascular disease. What nutrition changes can be made?  Eat more fruits, vegetables, and whole grains.  Reduce how much saturated fat you eat. To do this, eat less red meat and fewer full-fat dairy products.  Eat healthy proteins instead of red meat. Healthy proteins include: ? Fish. Eat fish that contains heart-healthy omega-3 fatty acids, twice a week. Examples include salmon, albacore tuna, mackerel, and herring. ? Chicken. ? Nuts. ? Low-fat or nonfat yogurt.  Avoid processed meats, like bacon and lunchmeat.  Avoid foods that contain: ? A lot of sugar, such as sweets and drinks with added sugar. ? A lot of salt (sodium). Avoid adding extra salt to your food, as told by your health care provider. ? Trans fats, such as margarine and baked goods. Trans fats may be listed as "partially hydrogenated oils" on food labels.  Check food labels to see how much sodium, sugar, and trans fats are in foods.  Use vegetable oils that contain low amounts of  saturated fat, such as olive oil or canola oil. What lifestyle changes can be made?  Drink alcohol in moderation. This means no more than 1 drink a day for nonpregnant women and 2 drinks a day for men. One drink equals 12 oz of beer, 5 oz of wine, or 1 oz of hard liquor.  If you are overweight, ask your health care provider to recommend a weight-loss plan for you. Losing 5-10 lb (2.2-4.5 kg) can reduce your risk of diabetes, atherosclerosis, and high blood pressure.  Exercise for 30?60 minutes on most days, or as much as told by your health care provider. ? Do moderate-intensity exercise, such as brisk walking, bicycling, and water aerobics. Ask your health care provider which activities are safe for you.  Do not use any products that contain nicotine or tobacco, such as cigarettes and e-cigarettes. If you need help quitting, ask your health care provider. Why are these changes important? Making these changes lowers your risk of many diseases that can cause cerebrovascular disease and stroke. Stroke is a leading cause of death and disability. Making these changes also improves your overall health and quality of life. What can I do to lower my risk? The following factors make you more likely to develop cerebrovascular disease:  Being overweight.  Smoking.  Being physically inactive.    Eating a high-fat diet.  Having certain health conditions, such as: ? Diabetes. ? High blood pressure. ? Heart disease. ? Atherosclerosis. ? High cholesterol. ? Sickle cell disease.  Talk with your health care provider about your risk for cerebrovascular disease. Work with your health care provider to control diseases that you have that may contribute to cerebrovascular disease. Your health care provider may prescribe medicines to help prevent major causes of cerebrovascular disease. Where to find more information: Learn more about preventing cerebrovascular disease from:  National Heart, Lung, and  Blood Institute: www.nhlbi.nih.gov/health/health-topics/topics/stroke  Centers for Disease Control and Prevention: cdc.gov/stroke/about.htm  Summary  Cerebrovascular disease can lead to a stroke.  Atherosclerosis and high blood pressure are major causes of cerebrovascular disease.  Making diet and lifestyle changes can reduce your risk of cerebrovascular disease.  Work with your health care provider to get your risk factors under control to reduce your risk of cerebrovascular disease. This information is not intended to replace advice given to you by your health care provider. Make sure you discuss any questions you have with your health care provider. Document Released: 03/29/2015 Document Revised: 10/02/2015 Document Reviewed: 03/29/2015 Elsevier Interactive Patient Education  2018 Elsevier Inc.  

## 2016-09-14 ENCOUNTER — Encounter: Payer: Self-pay | Admitting: Gastroenterology

## 2016-09-15 NOTE — Addendum Note (Signed)
Addended by: Lianne Cure A on: 09/15/2016 10:06 AM   Modules accepted: Orders

## 2016-11-03 ENCOUNTER — Ambulatory Visit (AMBULATORY_SURGERY_CENTER): Payer: Self-pay

## 2016-11-03 ENCOUNTER — Encounter: Payer: Self-pay | Admitting: Gastroenterology

## 2016-11-03 VITALS — Ht 62.5 in | Wt 155.2 lb

## 2016-11-03 DIAGNOSIS — Z1211 Encounter for screening for malignant neoplasm of colon: Secondary | ICD-10-CM

## 2016-11-03 MED ORDER — SUPREP BOWEL PREP KIT 17.5-3.13-1.6 GM/177ML PO SOLN: 1.0000 | Freq: Once | ORAL | 0 refills | Status: AC

## 2016-11-03 NOTE — Progress Notes (Signed)
No diet meds No home oxygen No allergies to eggs or soy No past problems with anesthesia  Registered emmi 

## 2016-11-17 ENCOUNTER — Encounter: Payer: Self-pay | Admitting: Gastroenterology

## 2016-11-17 ENCOUNTER — Ambulatory Visit (AMBULATORY_SURGERY_CENTER): Payer: Medicare HMO | Admitting: Gastroenterology

## 2016-11-17 VITALS — BP 109/66 | HR 72 | Temp 99.3°F | Resp 19 | Ht 62.0 in | Wt 155.0 lb

## 2016-11-17 DIAGNOSIS — Z1212 Encounter for screening for malignant neoplasm of rectum: Secondary | ICD-10-CM

## 2016-11-17 DIAGNOSIS — I10 Essential (primary) hypertension: Secondary | ICD-10-CM | POA: Diagnosis not present

## 2016-11-17 DIAGNOSIS — Z1211 Encounter for screening for malignant neoplasm of colon: Secondary | ICD-10-CM

## 2016-11-17 DIAGNOSIS — D123 Benign neoplasm of transverse colon: Secondary | ICD-10-CM

## 2016-11-17 DIAGNOSIS — Z8673 Personal history of transient ischemic attack (TIA), and cerebral infarction without residual deficits: Secondary | ICD-10-CM | POA: Diagnosis not present

## 2016-11-17 MED ORDER — SODIUM CHLORIDE 0.9 % IV SOLN: 500.0000 mL | INTRAVENOUS | Status: DC

## 2016-11-17 NOTE — Progress Notes (Signed)
Called to room to assist during endoscopic procedure.  Patient ID and intended procedure confirmed with present staff. Received instructions for my participation in the procedure from the performing physician.  

## 2016-11-17 NOTE — Patient Instructions (Signed)
YOU HAD AN ENDOSCOPIC PROCEDURE TODAY AT [facility]:   Refer to the procedure report that was given to you for any specific questions about what was found during the examination.  If the procedure report does not answer your questions, please call your gastroenterologist to clarify.  If you requested that your care partner not be given the details of your procedure findings, then the procedure report has been included in a sealed envelope for you to review at your convenience later.  YOU SHOULD EXPECT: Some feelings of bloating in the abdomen. Passage of more gas than usual.  Walking can help get rid of the air that was put into your GI tract during the procedure and reduce the bloating. If you had a lower endoscopy (such as a colonoscopy or flexible sigmoidoscopy) you may notice spotting of blood in your stool or on the toilet paper. If you underwent a bowel prep for your procedure, you may not have a normal bowel movement for a few days.  Please Note:  You might notice some irritation and congestion in your nose or some drainage.  This is from the oxygen used during your procedure.  There is no need for concern and it should clear up in a day or so.  SYMPTOMS TO REPORT IMMEDIATELY:   Following lower endoscopy (colonoscopy or flexible sigmoidoscopy):  Excessive amounts of blood in the stool  Significant tenderness or worsening of abdominal pains  Swelling of the abdomen that is new, acute  Fever of 100F or higher   For urgent or emergent issues, a gastroenterologist can be reached at any hour by calling [phone].   DIET:  We do recommend a small meal at first, but then you may proceed to your regular diet.  Drink plenty of fluids but you should avoid alcoholic beverages for 24 hours. Try to increase the fiber in your diet, and drink plenty of water.  ACTIVITY:  You should plan to take it easy for the rest of today and you should NOT DRIVE or use heavy machinery until  tomorrow (because of the sedation medicines used during the test).    FOLLOW UP: Our staff will call the number listed on your records the next business day following your procedure to check on you and address any questions or concerns that you may have regarding the information given to you following your procedure. If we do not reach you, we will leave a message.  However, if you are feeling well and you are not experiencing any problems, there is no need to return our call.  We will assume that you have returned to your regular daily activities without incident.  If any biopsies were taken you will be contacted by phone or by letter within the next 1-3 weeks.  Please call us at [phone] if you have not heard about the biopsies in 3 weeks.    SIGNATURES/CONFIDENTIALITY: You and/or your care partner have signed paperwork which will be entered into your electronic medical record.  These signatures attest to the fact that that the information above on your After Visit Summary has been reviewed and is understood.  Full responsibility of the confidentiality of this discharge information lies with you and/or your care-partner.  STOP SMOKING.  There is a greater incidence of polyps with smokers.

## 2016-11-17 NOTE — Progress Notes (Signed)
Report given to PACU, vss 

## 2016-11-17 NOTE — Op Note (Signed)
[facility] Patient Name: Carla Hopkins Procedure Date: 11/17/2016 9:56 AM MRN: [id number] Endoscopist: Mallie Mussel L. Loletha Carrow , MD Age: 66 Referring MD:  Date of Birth: 03/23/1951 Gender: Female Account #: [id number] Procedure:                Colonoscopy Indications:              Screening for colorectal malignant neoplasm, This                            is the patient's first colonoscopy Medicines:                Monitored Anesthesia Care Procedure:                Pre-Anesthesia Assessment:                           - Prior to the procedure, a History and Physical                            was performed, and patient medications and                            allergies were reviewed. The patient's tolerance of                            previous anesthesia was also reviewed. The risks                            and benefits of the procedure and the sedation                            options and risks were discussed with the patient.                            All questions were answered, and informed consent                            was obtained. Anticoagulants: The patient has taken                            aspirin. It was decided not to withhold this                            medication prior to the procedure. ASA Grade                            Assessment: III - A patient with severe systemic                            disease. After reviewing the risks and benefits,                            the patient was deemed in satisfactory condition to  undergo the procedure.                           After obtaining informed consent, the colonoscope                            was passed under direct vision. Throughout the                            procedure, the patient's blood pressure, pulse, and                            oxygen saturations were monitored continuously. The                            Colonoscope was introduced through the anus and                             advanced to the the cecum, identified by                            appendiceal orifice and ileocecal valve. The                            colonoscopy was performed without difficulty. The                            patient tolerated the procedure well. The quality                            of the bowel preparation was good. The ileocecal                            valve, appendiceal orifice, and rectum were                            photographed. The quality of the bowel preparation                            was evaluated using the BBPS [facility] Bowel                            Preparation Scale) with scores of: Right Colon = 2,                            Transverse Colon = 2 and Left Colon = 2. The total                            BBPS score equals 6. The bowel preparation used was                            SUPREP. Scope In: 10:13:42 AM Scope Out: 10:27:52 AM Scope Withdrawal Time: 0 hours 10 minutes 32 seconds  Total Procedure Duration:  0 hours 14 minutes 10 seconds  Findings:                 The digital rectal exam findings include decreased                            sphincter tone.                           Diverticula were found in the left colon.                           There was a medium-sized lipoma, in the descending                            colon.                           A 4 mm polyp was found in the distal transverse                            colon. The polyp was sessile. The polyp was removed                            with a cold snare. Resection and retrieval were                            complete.                           The exam was otherwise without abnormality on                            direct and retroflexion views. Complications:            No immediate complications. Estimated Blood Loss:     Estimated blood loss was minimal. Impression:               - Decreased sphincter tone found on digital rectal                             exam.                           - Diverticulosis in the left colon.                           - Medium-sized lipoma in the descending colon.                           - One 4 mm polyp in the distal transverse colon,                            removed with a cold snare. Resected and retrieved.                           - The examination was otherwise  normal on direct                            and retroflexion views. Recommendation:           - Patient has a contact number available for                            emergencies. The signs and symptoms of potential                            delayed complications were discussed with the                            patient. Return to normal activities tomorrow.                            Written discharge instructions were provided to the                            patient.                           - Resume previous diet.                           - Continue present medications.                           - Await pathology results.                           - Repeat colonoscopy is recommended for                            surveillance. The colonoscopy date will be                            determined after pathology results from today's                            exam become available for review. Ceri Mayer L. Loletha Carrow, MD 11/17/2016 10:31:55 AM This report has been signed electronically.

## 2016-11-17 NOTE — Progress Notes (Signed)
Pt's states no medical or surgical changes since previsit or office visit. 

## 2016-11-18 ENCOUNTER — Telehealth: Payer: Self-pay | Admitting: *Deleted

## 2016-11-18 NOTE — Telephone Encounter (Signed)
  Follow up Call-  Call back number 11/17/2016  Post procedure Call Back phone  # [phone]  Permission to leave phone message Yes  Some recent data might be hidden     No answer, left message.

## 2016-11-18 NOTE — Telephone Encounter (Signed)
  Follow up Call-  Call back number 11/17/2016  Post procedure Call Back phone  # [phone]  Permission to leave phone message Yes  Some recent data might be hidden     Patient questions:  Do you have a fever, pain , or abdominal swelling? No. Pain Score  0 *  Have you tolerated food without any problems? Yes.    Have you been able to return to your normal activities? Yes.    Do you have any questions about your discharge instructions: Diet   No. Medications  No. Follow up visit  No.  Do you have questions or concerns about your Care? No.  Actions: * If pain score is 4 or above: No action needed, pain <4.

## 2016-11-23 ENCOUNTER — Encounter: Payer: Self-pay | Admitting: Gastroenterology

## 2017-01-09 ENCOUNTER — Encounter: Payer: Self-pay | Admitting: Physician Assistant

## 2017-01-09 ENCOUNTER — Telehealth: Payer: Self-pay | Admitting: Physician Assistant

## 2017-01-09 ENCOUNTER — Ambulatory Visit (INDEPENDENT_AMBULATORY_CARE_PROVIDER_SITE_OTHER): Payer: Medicare HMO | Admitting: Physician Assistant

## 2017-01-09 VITALS — BP 164/88 | HR 88 | Temp 97.8°F | Resp 16 | Ht 62.0 in | Wt 156.0 lb

## 2017-01-09 DIAGNOSIS — E782 Mixed hyperlipidemia: Secondary | ICD-10-CM

## 2017-01-09 DIAGNOSIS — E034 Atrophy of thyroid (acquired): Secondary | ICD-10-CM

## 2017-01-09 DIAGNOSIS — I10 Essential (primary) hypertension: Secondary | ICD-10-CM | POA: Diagnosis not present

## 2017-01-09 DIAGNOSIS — E119 Type 2 diabetes mellitus without complications: Secondary | ICD-10-CM | POA: Diagnosis not present

## 2017-01-09 MED ORDER — ATORVASTATIN CALCIUM 80 MG PO TABS: ORAL_TABLET | ORAL | 1 refills | Status: DC

## 2017-01-09 MED ORDER — LISINOPRIL 10 MG PO TABS: 10.0000 mg | ORAL_TABLET | Freq: Every day | ORAL | 1 refills | Status: DC

## 2017-01-09 NOTE — Progress Notes (Signed)
PRIMARY CARE AT [facility] [address], [place] [address] [phone]  Date:  01/09/2017   Name:  Carla Hopkins   DOB:  1950-04-23   MRN:  [id number]  PCP:  Joretta Bachelor, PA    History of Present Illness:  Cecily Lawhorne is a 66 y.o. female patient who presents to PCP with  Chief Complaint  Patient presents with  . Follow-up    DM check/a1c check  . Medication Refill    all meds. pt would like a 90 day script     Patient states that she is taking her medication compliantly.  No side effects to the medication  She is eating a lot of frozen dinners.  She reports no lower extremity swelling.  No vision changes.  Today she returns with form for advanced directives. She is watching her intake.    Thyroid medication.  Compliant.     Patient Active Problem List   Diagnosis Date Noted  . History of CVA with residual deficit 12/30/2014  . Left hemiparesis ([place]) 10/13/2014  . Left-sided neglect 10/13/2014  . History of right MCA stroke 08/06/2014  . Carotid stenosis 08/05/2014  . Acute right MCA stroke ([place]) 07/30/2014  . Cerebral infarction due to thrombosis of right middle cerebral artery ([place])   . Thyroid activity decreased   . Left-sided weakness   . Stroke ([place])   . Essential hypertension   . Carotid artery stenosis   . CVA (cerebral infarction) 07/24/2014  . Hyperlipidemia 07/24/2014  . Diabetes ([place]) 07/24/2014  . Elevated troponin 07/24/2014  . Tobacco abuse 07/24/2014  . Benign essential HTN 07/23/2014  . Obstructive sleep apnea 07/23/2014  . Hypothyroidism 07/03/2014  . Type II or unspecified type diabetes mellitus without mention of complication, not stated as uncontrolled 09/25/2013  . BMI 38.0-38.9,adult 05/19/2013  . Nicotine addiction 05/19/2013  . Right atrial enlargement 05/19/2013    Past Medical History:  Diagnosis Date  . Hypertension   . Hypothyroidism   . Stroke [facility])     Past Surgical History:  Procedure Laterality Date  .  ENDARTERECTOMY Right 08/05/2014   Procedure: Right Carotid ENDARTERECTOMY ;  Surgeon: Elam Dutch, MD;  Location: [place];  Service: Vascular;  Laterality: Right;  . PATCH ANGIOPLASTY Right 08/05/2014   Procedure: PATCH ANGIOPLASTY;  Surgeon: Elam Dutch, MD;  Location: [place];  Service: Vascular;  Laterality: Right;  . TUBAL LIGATION      Social History  Substance Use Topics  . Smoking status: Former Smoker    Packs/day: 20.00    Types: Cigarettes  . Smokeless tobacco: Never Used     Comment: quit smoking July 24, 2014  . Alcohol use 0.0 oz/week     Comment: once yearly    Family History  Problem Relation Age of Onset  . Adopted: Yes  . Colon cancer Neg Hx     No Known Allergies  Medication list has been reviewed and updated.  Current Outpatient Prescriptions on File Prior to Visit  Medication Sig Dispense Refill  . aspirin 81 MG chewable tablet Chew 1 tablet (81 mg total) by mouth daily.    Marland Kitchen atorvastatin (LIPITOR) 80 MG tablet TAKE 1 TABLET EVERY DAY 90 tablet 1  . levothyroxine (SYNTHROID, LEVOTHROID) 88 MCG tablet TAKE 1 TABLET EVERY DAY 90 tablet 1  . lisinopril (PRINIVIL,ZESTRIL) 10 MG tablet Take 1 tablet (10 mg total) by mouth daily. 90 tablet 1  . Multiple Vitamin (MULTIVITAMIN) tablet Take 1 tablet by mouth  daily.     Current Facility-Administered Medications on File Prior to Visit  Medication Dose Route Frequency Provider Last Rate Last Dose  . 0.9 %  sodium chloride infusion  500 mL Intravenous Continuous Danis, Estill Cotta III, MD        ROS ROS otherwise unremarkable unless listed above.  Physical Examination: BP (!) 164/88   Pulse 88   Temp 97.8 F (36.6 C) (Oral)   Resp 16   Ht 5\' 2"  (1.575 m)   Wt 156 lb (70.8 kg)   SpO2 95%   BMI 28.53 kg/m  Ideal Body Weight: Weight in (lb) to have BMI = 25: 136.4  Physical Exam  Constitutional: She is oriented to person, place, and time. She appears well-developed and well-nourished. No distress.   HENT:  Head: Normocephalic and atraumatic.  Right Ear: External ear normal.  Left Ear: External ear normal.  Eyes: Pupils are equal, round, and reactive to light. Conjunctivae and EOM are normal.  Cardiovascular: Normal rate and regular rhythm.  Exam reveals no friction rub.   Murmur (systolic 3/6) heard. Pulmonary/Chest: Effort normal. No respiratory distress.  Neurological: She is alert and oriented to person, place, and time.  Skin: She is not diaphoretic.  Psychiatric: She has a normal mood and affect. Her behavior is normal.     Assessment and Plan: Mckynzi Cammon is a 66 y.o. female who is here today for cc of  Chief Complaint  Patient presents with  . Follow-up    DM check/a1c check  . Medication Refill    all meds. pt would like a 90 day script   1. Type 2 diabetes mellitus without complication, without long-term current use of insulin (HCC) Stable, filled for 6 months - Hemoglobin H8E - Basic metabolic panel  2. Mixed hyperlipidemia Stable 6 month.  May refill for 1 year.  - atorvastatin (LIPITOR) 80 MG tablet; TAKE 1 TABLET EVERY DAY  Dispense: 90 tablet; Refill: 1  3. Hypothyroidism due to acquired atrophy of thyroid Stable, refill given as labs wnl - TSH - levothyroxine (SYNTHROID, LEVOTHROID) 88 MCG tablet; TAKE 1 TABLET EVERY DAY  Dispense: 90 tablet; Refill: 1  4. Benign essential HTN Stable, refill for 6 months - lisinopril (PRINIVIL,ZESTRIL) 10 MG tablet; Take 1 tablet (10 mg total) by mouth daily.  Dispense: 90 tablet; Refill: 1 - Basic metabolic panel  Ivar Drape, PA-C [facility] 01/09/2017 2:22 PM

## 2017-01-09 NOTE — Patient Instructions (Addendum)
DASH Eating Plan DASH stands for "Dietary Approaches to Stop Hypertension." The DASH eating plan is a healthy eating plan that has been shown to reduce high blood pressure (hypertension). It may also reduce your risk for type 2 diabetes, heart disease, and stroke. The DASH eating plan may also help with weight loss. What are tips for following this plan? General guidelines  Avoid eating more than 2,300 mg (milligrams) of salt (sodium) a day. If you have hypertension, you may need to reduce your sodium intake to 1,500 mg a day.  Limit alcohol intake to no more than 1 drink a day for nonpregnant women and 2 drinks a day for men. One drink equals 12 oz of beer, 5 oz of wine, or 1 oz of hard liquor.  Work with your health care provider to maintain a healthy body weight or to lose weight. Ask what an ideal weight is for you.  Get at least 30 minutes of exercise that causes your heart to beat faster (aerobic exercise) most days of the week. Activities may include walking, swimming, or biking.  Work with your health care provider or diet and nutrition specialist (dietitian) to adjust your eating plan to your individual calorie needs. Reading food labels  Check food labels for the amount of sodium per serving. Choose foods with less than 5 percent of the Daily Value of sodium. Generally, foods with less than 300 mg of sodium per serving fit into this eating plan.  To find whole grains, look for the word "whole" as the first word in the ingredient list. Shopping  Buy products labeled as "low-sodium" or "no salt added."  Buy fresh foods. Avoid canned foods and premade or frozen meals. Cooking  Avoid adding salt when cooking. Use salt-free seasonings or herbs instead of table salt or sea salt. Check with your health care provider or pharmacist before using salt substitutes.  Do not fry foods. Cook foods using healthy methods such as baking, boiling, grilling, and broiling instead.  Cook with  heart-healthy oils, such as olive, canola, soybean, or sunflower oil. Meal planning   Eat a balanced diet that includes: ? 5 or more servings of fruits and vegetables each day. At each meal, try to fill half of your plate with fruits and vegetables. ? Up to 6-8 servings of whole grains each day. ? Less than 6 oz of lean meat, poultry, or fish each day. A 3-oz serving of meat is about the same size as a deck of cards. One egg equals 1 oz. ? 2 servings of low-fat dairy each day. ? A serving of nuts, seeds, or beans 5 times each week. ? Heart-healthy fats. Healthy fats called Omega-3 fatty acids are found in foods such as flaxseeds and coldwater fish, like sardines, salmon, and mackerel.  Limit how much you eat of the following: ? Canned or prepackaged foods. ? Food that is high in trans fat, such as fried foods. ? Food that is high in saturated fat, such as fatty meat. ? Sweets, desserts, sugary drinks, and other foods with added sugar. ? Full-fat dairy products.  Do not salt foods before eating.  Try to eat at least 2 vegetarian meals each week.  Eat more home-cooked food and less restaurant, buffet, and fast food.  When eating at a restaurant, ask that your food be prepared with less salt or no salt, if possible. What foods are recommended? The items listed may not be a complete list. Talk with your dietitian about what  dietary choices are best for you. Grains Whole-grain or whole-wheat bread. Whole-grain or whole-wheat pasta. Brown rice. Oatmeal. Quinoa. Bulgur. Whole-grain and low-sodium cereals. Pita bread. Low-fat, low-sodium crackers. Whole-wheat flour tortillas. Vegetables Fresh or frozen vegetables (raw, steamed, roasted, or grilled). Low-sodium or reduced-sodium tomato and vegetable juice. Low-sodium or reduced-sodium tomato sauce and tomato paste. Low-sodium or reduced-sodium canned vegetables. Fruits All fresh, dried, or frozen fruit. Canned fruit in natural juice (without  added sugar). Meat and other protein foods Skinless chicken or turkey. Ground chicken or turkey. Pork with fat trimmed off. Fish and seafood. Egg whites. Dried beans, peas, or lentils. Unsalted nuts, nut butters, and seeds. Unsalted canned beans. Lean cuts of beef with fat trimmed off. Low-sodium, lean deli meat. Dairy Low-fat (1%) or fat-free (skim) milk. Fat-free, low-fat, or reduced-fat cheeses. Nonfat, low-sodium ricotta or cottage cheese. Low-fat or nonfat yogurt. Low-fat, low-sodium cheese. Fats and oils Soft margarine without trans fats. Vegetable oil. Low-fat, reduced-fat, or light mayonnaise and salad dressings (reduced-sodium). Canola, safflower, olive, soybean, and sunflower oils. Avocado. Seasoning and other foods Herbs. Spices. Seasoning mixes without salt. Unsalted popcorn and pretzels. Fat-free sweets. What foods are not recommended? The items listed may not be a complete list. Talk with your dietitian about what dietary choices are best for you. Grains Baked goods made with fat, such as croissants, muffins, or some breads. Dry pasta or rice meal packs. Vegetables Creamed or fried vegetables. Vegetables in a cheese sauce. Regular canned vegetables (not low-sodium or reduced-sodium). Regular canned tomato sauce and paste (not low-sodium or reduced-sodium). Regular tomato and vegetable juice (not low-sodium or reduced-sodium). Pickles. Olives. Fruits Canned fruit in a light or heavy syrup. Fried fruit. Fruit in cream or butter sauce. Meat and other protein foods Fatty cuts of meat. Ribs. Fried meat. Bacon. Sausage. Bologna and other processed lunch meats. Salami. Fatback. Hotdogs. Bratwurst. Salted nuts and seeds. Canned beans with added salt. Canned or smoked fish. Whole eggs or egg yolks. Chicken or turkey with skin. Dairy Whole or 2% milk, cream, and half-and-half. Whole or full-fat cream cheese. Whole-fat or sweetened yogurt. Full-fat cheese. Nondairy creamers. Whipped toppings.  Processed cheese and cheese spreads. Fats and oils Butter. Stick margarine. Lard. Shortening. Ghee. Bacon fat. Tropical oils, such as coconut, palm kernel, or palm oil. Seasoning and other foods Salted popcorn and pretzels. Onion salt, garlic salt, seasoned salt, table salt, and sea salt. Worcestershire sauce. Tartar sauce. Barbecue sauce. Teriyaki sauce. Soy sauce, including reduced-sodium. Steak sauce. Canned and packaged gravies. Fish sauce. Oyster sauce. Cocktail sauce. Horseradish that you find on the shelf. Ketchup. Mustard. Meat flavorings and tenderizers. Bouillon cubes. Hot sauce and Tabasco sauce. Premade or packaged marinades. Premade or packaged taco seasonings. Relishes. Regular salad dressings. Where to find more information:  National Heart, Lung, and Blood Institute: www.nhlbi.nih.gov  American Heart Association: www.heart.org Summary  The DASH eating plan is a healthy eating plan that has been shown to reduce high blood pressure (hypertension). It may also reduce your risk for type 2 diabetes, heart disease, and stroke.  With the DASH eating plan, you should limit salt (sodium) intake to 2,300 mg a day. If you have hypertension, you may need to reduce your sodium intake to 1,500 mg a day.  When on the DASH eating plan, aim to eat more fresh fruits and vegetables, whole grains, lean proteins, low-fat dairy, and heart-healthy fats.  Work with your health care provider or diet and nutrition specialist (dietitian) to adjust your eating plan to your individual   calorie needs. This information is not intended to replace advice given to you by your health care provider. Make sure you discuss any questions you have with your health care provider. Document Released: 03/03/2011 Document Revised: 03/07/2016 Document Reviewed: 03/07/2016 Elsevier Interactive Patient Education  2017 [employer].    IF you received an x-ray today, you will receive an invoice from [facility].  Please contact [facility] at [phone] with questions or concerns regarding your invoice.   IF you received labwork today, you will receive an invoice from [facility]. Please contact [facility] at [phone] with questions or concerns regarding your invoice.   Our billing staff will not be able to assist you with questions regarding bills from these companies.  You will be contacted with the lab results as soon as they are available. The fastest way to get your results is to activate your My Chart account. Instructions are located on the last page of this paperwork. If you have not heard from [place] regarding the results in 2 weeks, please contact this office.

## 2017-01-10 LAB — BASIC METABOLIC PANEL WITH GFR
BUN/Creatinine Ratio: 15 (ref 12–28)
BUN: 16 mg/dL (ref 8–27)
CO2: 17 mmol/L — ABNORMAL LOW (ref 20–29)
Calcium: 9.9 mg/dL (ref 8.7–10.3)
Chloride: 108 mmol/L — ABNORMAL HIGH (ref 96–106)
Creatinine, Ser: 1.04 mg/dL — ABNORMAL HIGH (ref 0.57–1.00)
GFR calc Af Amer: 64 mL/min/{1.73_m2}
GFR calc non Af Amer: 56 mL/min/{1.73_m2} — ABNORMAL LOW
Glucose: 85 mg/dL (ref 65–99)
Potassium: 4 mmol/L (ref 3.5–5.2)
Sodium: 144 mmol/L (ref 134–144)

## 2017-01-10 LAB — HEMOGLOBIN A1C
ESTIMATED AVERAGE GLUCOSE: 120 mg/dL
Hgb A1c MFr Bld: 5.8 % — ABNORMAL HIGH (ref 4.8–5.6)

## 2017-01-10 LAB — TSH: TSH: 3.78 u[IU]/mL (ref 0.450–4.500)

## 2017-01-10 MED ORDER — LEVOTHYROXINE SODIUM 88 MCG PO TABS: ORAL_TABLET | ORAL | 1 refills | Status: DC

## 2017-01-24 NOTE — Telephone Encounter (Signed)
error 

## 2017-03-19 DIAGNOSIS — I951 Orthostatic hypotension: Secondary | ICD-10-CM | POA: Diagnosis not present

## 2017-03-19 DIAGNOSIS — I69354 Hemiplegia and hemiparesis following cerebral infarction affecting left non-dominant side: Secondary | ICD-10-CM | POA: Diagnosis not present

## 2017-03-19 DIAGNOSIS — M25562 Pain in left knee: Secondary | ICD-10-CM | POA: Diagnosis not present

## 2017-03-19 DIAGNOSIS — Z87891 Personal history of nicotine dependence: Secondary | ICD-10-CM | POA: Diagnosis not present

## 2017-03-19 DIAGNOSIS — S72102A Unspecified trochanteric fracture of left femur, initial encounter for closed fracture: Secondary | ICD-10-CM | POA: Diagnosis not present

## 2017-03-19 DIAGNOSIS — I10 Essential (primary) hypertension: Secondary | ICD-10-CM | POA: Diagnosis not present

## 2017-03-19 DIAGNOSIS — I69351 Hemiplegia and hemiparesis following cerebral infarction affecting right dominant side: Secondary | ICD-10-CM | POA: Diagnosis not present

## 2017-03-19 DIAGNOSIS — D649 Anemia, unspecified: Secondary | ICD-10-CM | POA: Diagnosis not present

## 2017-03-19 DIAGNOSIS — R011 Cardiac murmur, unspecified: Secondary | ICD-10-CM | POA: Diagnosis not present

## 2017-03-19 DIAGNOSIS — T1490XA Injury, unspecified, initial encounter: Secondary | ICD-10-CM | POA: Diagnosis not present

## 2017-03-19 DIAGNOSIS — S7222XA Displaced subtrochanteric fracture of left femur, initial encounter for closed fracture: Secondary | ICD-10-CM | POA: Diagnosis not present

## 2017-03-19 DIAGNOSIS — S72002A Fracture of unspecified part of neck of left femur, initial encounter for closed fracture: Secondary | ICD-10-CM | POA: Diagnosis not present

## 2017-03-19 DIAGNOSIS — E038 Other specified hypothyroidism: Secondary | ICD-10-CM | POA: Diagnosis not present

## 2017-03-19 DIAGNOSIS — E039 Hypothyroidism, unspecified: Secondary | ICD-10-CM | POA: Diagnosis not present

## 2017-03-19 DIAGNOSIS — J939 Pneumothorax, unspecified: Secondary | ICD-10-CM | POA: Diagnosis not present

## 2017-03-19 DIAGNOSIS — S72142A Displaced intertrochanteric fracture of left femur, initial encounter for closed fracture: Secondary | ICD-10-CM | POA: Diagnosis not present

## 2017-03-19 DIAGNOSIS — M25552 Pain in left hip: Secondary | ICD-10-CM | POA: Diagnosis not present

## 2017-03-19 DIAGNOSIS — R9431 Abnormal electrocardiogram [ECG] [EKG]: Secondary | ICD-10-CM | POA: Diagnosis not present

## 2017-03-19 DIAGNOSIS — R531 Weakness: Secondary | ICD-10-CM | POA: Diagnosis not present

## 2017-03-19 DIAGNOSIS — R4189 Other symptoms and signs involving cognitive functions and awareness: Secondary | ICD-10-CM | POA: Diagnosis not present

## 2017-03-19 DIAGNOSIS — I69359 Hemiplegia and hemiparesis following cerebral infarction affecting unspecified side: Secondary | ICD-10-CM | POA: Diagnosis not present

## 2017-03-19 DIAGNOSIS — S72142D Displaced intertrochanteric fracture of left femur, subsequent encounter for closed fracture with routine healing: Secondary | ICD-10-CM | POA: Diagnosis not present

## 2017-03-19 DIAGNOSIS — Z9181 History of falling: Secondary | ICD-10-CM | POA: Diagnosis not present

## 2017-03-19 DIAGNOSIS — I9589 Other hypotension: Secondary | ICD-10-CM | POA: Diagnosis not present

## 2017-03-19 DIAGNOSIS — R69 Illness, unspecified: Secondary | ICD-10-CM | POA: Diagnosis not present

## 2017-03-19 DIAGNOSIS — D72828 Other elevated white blood cell count: Secondary | ICD-10-CM | POA: Diagnosis not present

## 2017-03-19 DIAGNOSIS — R1310 Dysphagia, unspecified: Secondary | ICD-10-CM | POA: Diagnosis not present

## 2017-03-19 DIAGNOSIS — E870 Hyperosmolality and hypernatremia: Secondary | ICD-10-CM | POA: Diagnosis not present

## 2017-03-19 DIAGNOSIS — E785 Hyperlipidemia, unspecified: Secondary | ICD-10-CM | POA: Diagnosis not present

## 2017-03-19 DIAGNOSIS — I517 Cardiomegaly: Secondary | ICD-10-CM | POA: Diagnosis not present

## 2017-03-19 DIAGNOSIS — D72829 Elevated white blood cell count, unspecified: Secondary | ICD-10-CM | POA: Diagnosis not present

## 2017-03-19 DIAGNOSIS — K219 Gastro-esophageal reflux disease without esophagitis: Secondary | ICD-10-CM | POA: Diagnosis not present

## 2017-03-19 DIAGNOSIS — Z01810 Encounter for preprocedural cardiovascular examination: Secondary | ICD-10-CM | POA: Diagnosis not present

## 2017-03-31 DIAGNOSIS — G8190 Hemiplegia, unspecified affecting unspecified side: Secondary | ICD-10-CM | POA: Diagnosis not present

## 2017-03-31 DIAGNOSIS — S72142D Displaced intertrochanteric fracture of left femur, subsequent encounter for closed fracture with routine healing: Secondary | ICD-10-CM | POA: Diagnosis not present

## 2017-03-31 DIAGNOSIS — Z9181 History of falling: Secondary | ICD-10-CM | POA: Diagnosis not present

## 2017-03-31 DIAGNOSIS — R4189 Other symptoms and signs involving cognitive functions and awareness: Secondary | ICD-10-CM | POA: Diagnosis not present

## 2017-03-31 DIAGNOSIS — I69354 Hemiplegia and hemiparesis following cerebral infarction affecting left non-dominant side: Secondary | ICD-10-CM | POA: Diagnosis not present

## 2017-03-31 DIAGNOSIS — R609 Edema, unspecified: Secondary | ICD-10-CM | POA: Diagnosis not present

## 2017-03-31 DIAGNOSIS — S72142A Displaced intertrochanteric fracture of left femur, initial encounter for closed fracture: Secondary | ICD-10-CM | POA: Diagnosis not present

## 2017-03-31 DIAGNOSIS — R1310 Dysphagia, unspecified: Secondary | ICD-10-CM | POA: Diagnosis not present

## 2017-03-31 DIAGNOSIS — I6389 Other cerebral infarction: Secondary | ICD-10-CM | POA: Diagnosis not present

## 2017-03-31 DIAGNOSIS — E785 Hyperlipidemia, unspecified: Secondary | ICD-10-CM | POA: Diagnosis not present

## 2017-03-31 DIAGNOSIS — I951 Orthostatic hypotension: Secondary | ICD-10-CM | POA: Diagnosis not present

## 2017-03-31 DIAGNOSIS — R531 Weakness: Secondary | ICD-10-CM | POA: Diagnosis not present

## 2017-03-31 DIAGNOSIS — D649 Anemia, unspecified: Secondary | ICD-10-CM | POA: Diagnosis not present

## 2017-03-31 DIAGNOSIS — S72141A Displaced intertrochanteric fracture of right femur, initial encounter for closed fracture: Secondary | ICD-10-CM | POA: Diagnosis not present

## 2017-03-31 DIAGNOSIS — I69359 Hemiplegia and hemiparesis following cerebral infarction affecting unspecified side: Secondary | ICD-10-CM | POA: Insufficient documentation

## 2017-03-31 DIAGNOSIS — R7989 Other specified abnormal findings of blood chemistry: Secondary | ICD-10-CM | POA: Diagnosis not present

## 2017-03-31 DIAGNOSIS — D72829 Elevated white blood cell count, unspecified: Secondary | ICD-10-CM | POA: Diagnosis not present

## 2017-03-31 DIAGNOSIS — E870 Hyperosmolality and hypernatremia: Secondary | ICD-10-CM | POA: Diagnosis not present

## 2017-03-31 DIAGNOSIS — R011 Cardiac murmur, unspecified: Secondary | ICD-10-CM | POA: Diagnosis not present

## 2017-03-31 DIAGNOSIS — I69234 Monoplegia of upper limb following other nontraumatic intracranial hemorrhage affecting left non-dominant side: Secondary | ICD-10-CM | POA: Diagnosis not present

## 2017-04-04 IMAGING — MR MR MRA HEAD W/O CM
10 of 11 series · 29 of 48 positions shown · non-contrast
Comparison: Prior CT and CTA from 07/24/2014.

CLINICAL DATA: Initial evaluation for acute left-sided weakness.

EXAM:
MRI HEAD WITHOUT CONTRAST
MRA HEAD WITHOUT CONTRAST
TECHNIQUE: Multiplanar, multiecho pulse sequences of the brain and surrounding
structures were obtained without intravenous contrast. Angiographic
images of the head were obtained using MRA technique without
contrast.

[Series 3: FLAIR · sagittal · 5.0mm · 0.47mm/px · 1 of 23 slices shown (1 of 2)]
[im 1/23]
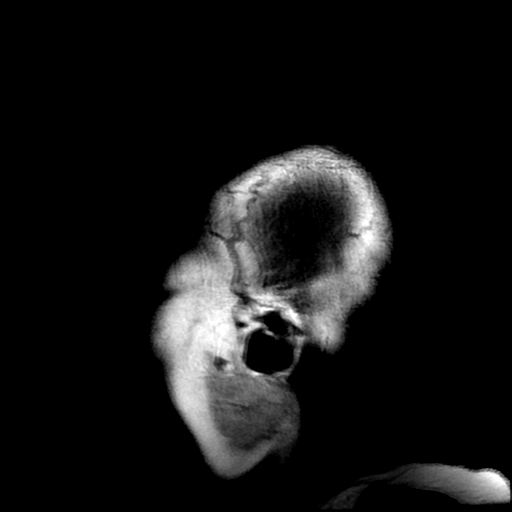

[Series 5: DWI · axial · 3.0mm · 0.94mm/px · z∈[-102,+35]mm · 7 of 94 slices shown (1 of 4)]
[im 1/94]
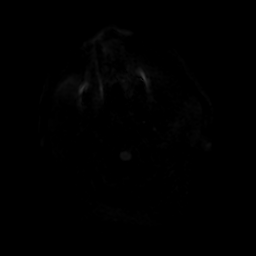
[im 16/94]
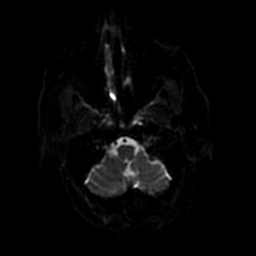
[im 32/94]
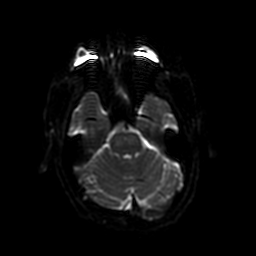
[im 47/94]
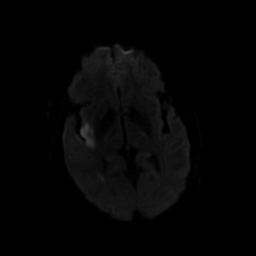
[im 63/94]
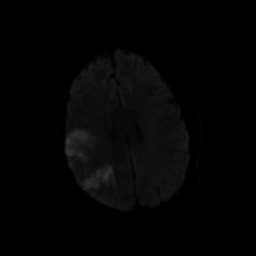
[im 78/94]
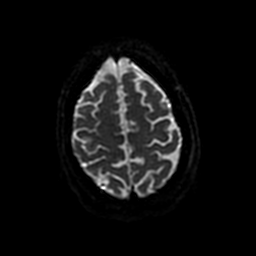
[im 94/94]
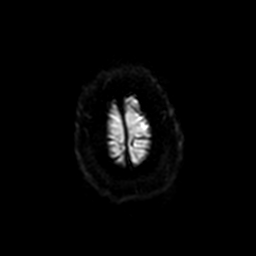

[Series 6: ax (id) 2 · axial · 1.2mm · 0.43mm/px · z∈[-134,-118]mm · 2 of 200 slices shown]
[im 1/200]
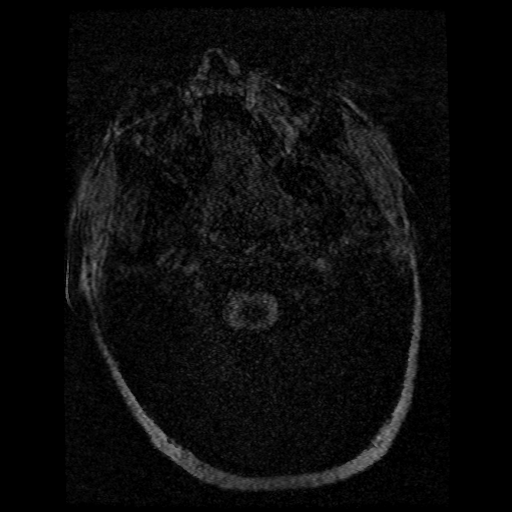
[im 29/200]
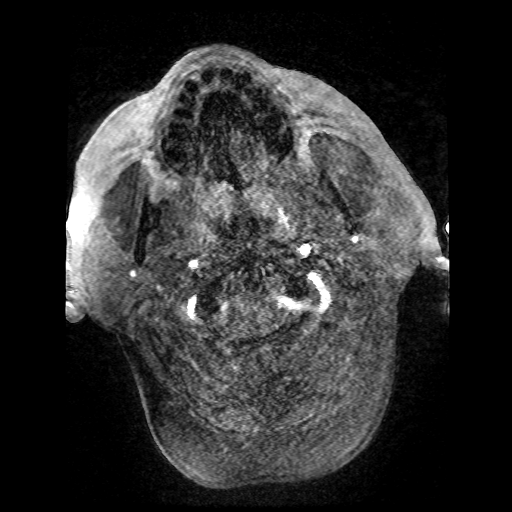

[Series 7: T2 · axial · 5.0mm · 0.47mm/px · z∈[-102,+35]mm · 2 of 24 slices shown (1 of 2)]
[im 1/24]
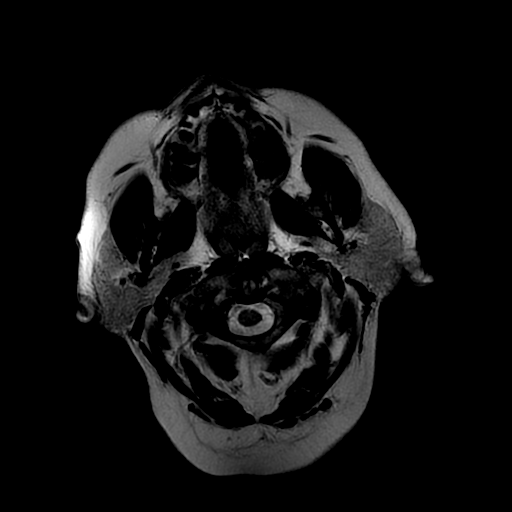
[im 24/24]
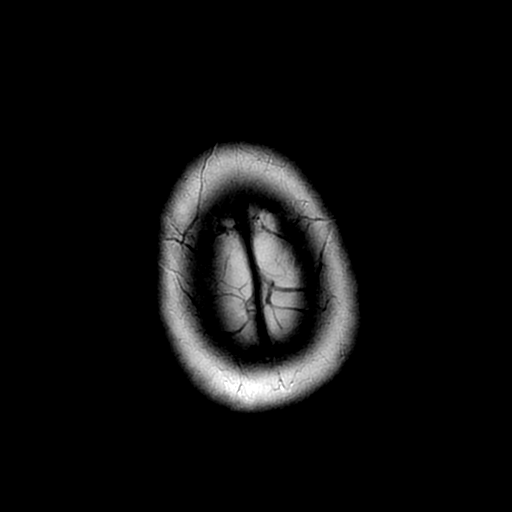

[Series 8: DWI · coronal · 5.0mm · 0.94mm/px · 5 of 62 slices shown (2 of 4)]
[im 1/62]
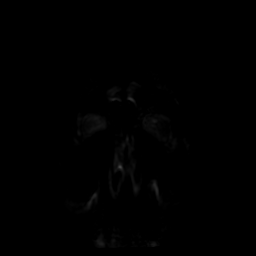
[im 16/62]
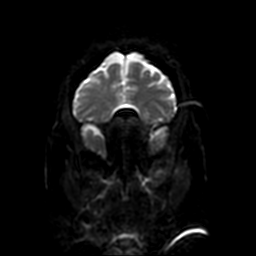
[im 31/62]
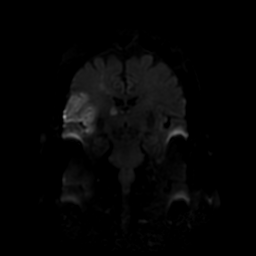
[im 46/62]
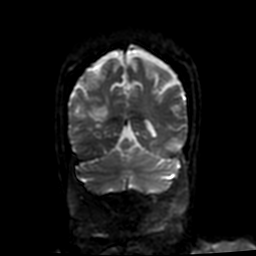
[im 62/62]
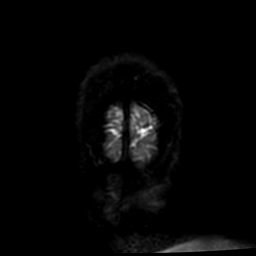

[Series 10: GRE · axial · 5.0mm · 0.47mm/px · z∈[-102,+35]mm · 2 of 24 slices shown]
[im 1/24]
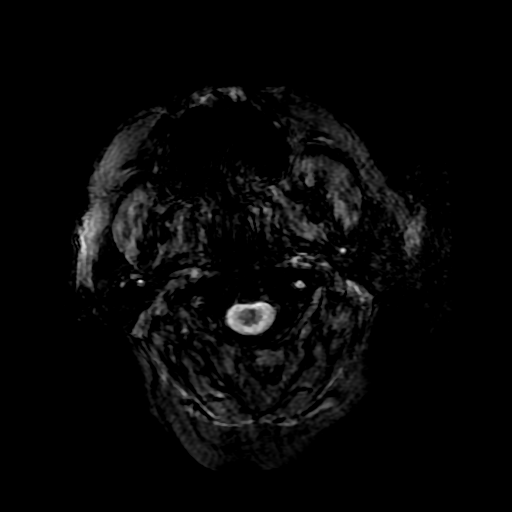
[im 24/24]
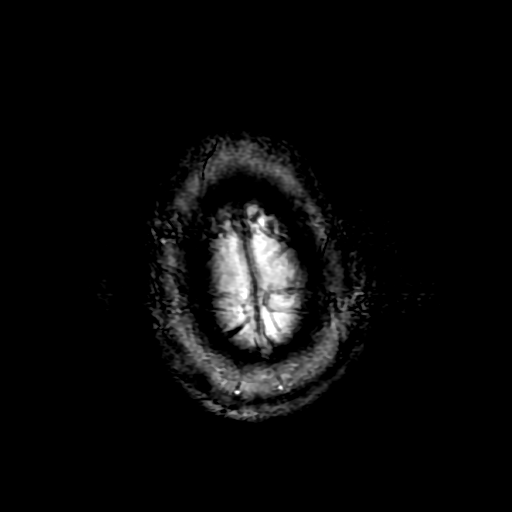

[Series 11: FLAIR · axial · 5.0mm · 0.47mm/px · z∈[-102,+35]mm · 2 of 24 slices shown (2 of 2)]
[im 1/24]
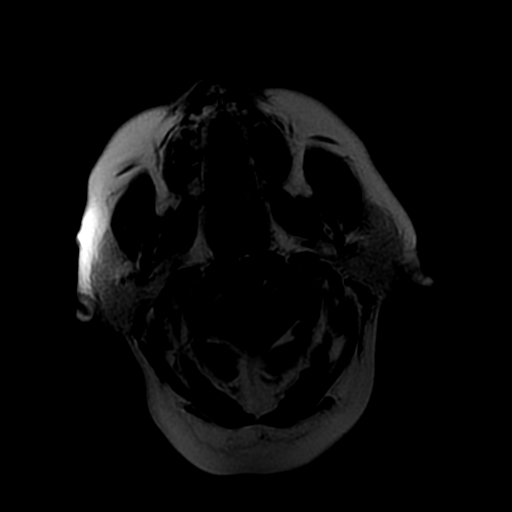
[im 24/24]
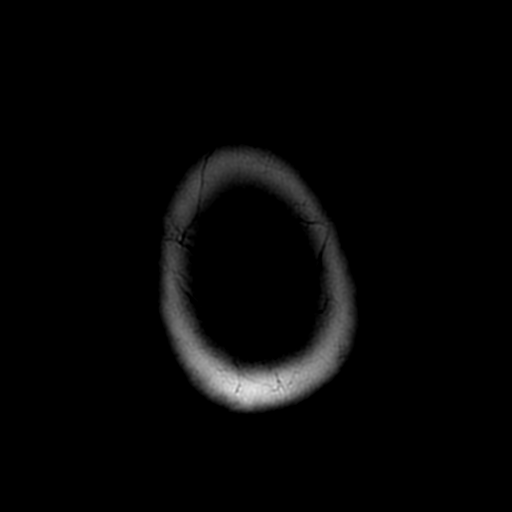

[Series 12: T2 · coronal · 5.0mm · 0.47mm/px · 2 of 26 slices shown (2 of 2)]
[im 1/26]
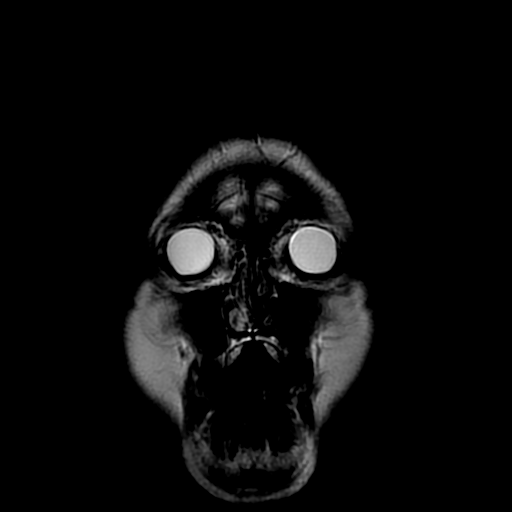
[im 26/26]
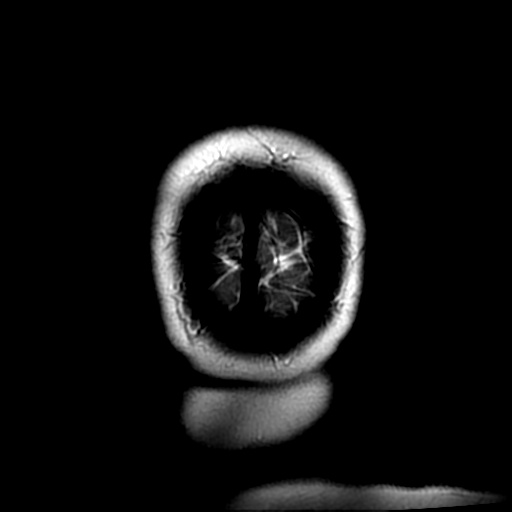

[Series 500: DWI · axial · 3.0mm · 0.94mm/px · z∈[-102,+35]mm · 4 of 46 slices shown (3 of 4)]
[im 1/46]
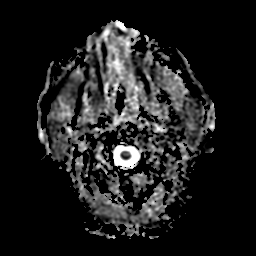
[im 16/46]
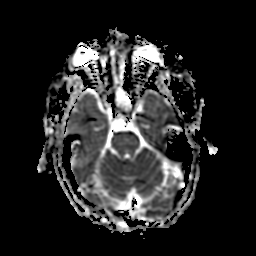
[im 31/46]
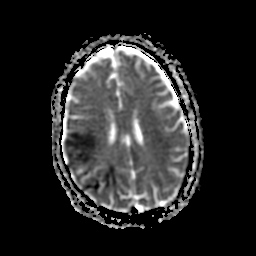
[im 46/46]
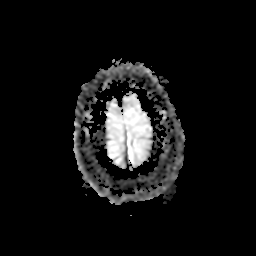

[Series 800: DWI · coronal · 5.0mm · 0.94mm/px · 2 of 31 slices shown (4 of 4)]
[im 1/31]
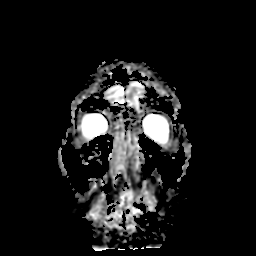
[im 31/31]
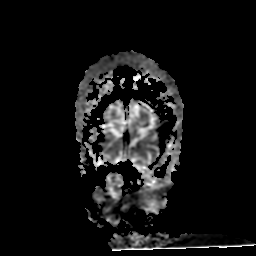

[29 of 48 positions shown; findings below may reference images not displayed]

FINDINGS: MRI HEAD FINDINGS

Patchy multi focal areas of restricted diffusion again seen
throughout the right frontal and parietal lobes, compatible with
acute right MCA territory infarct. There is involvement of the right
insular cortex. Infarct involves the cortical gray matter as well as
the deep and subcortical white matter. There is a 13 mm infarct
within the right thalamus. There is localized cytotoxic edema
without significant mass effect. Overall, distribution is stable
from prior CT. There is minimal associated petechial hemorrhage
without frank hemorrhagic conversion. There is an additional 8 mm
cortical infarct within the parasagittal anterior right frontal lobe
(series 5, image 30), likely ACA distribution. No left cerebral or
infratentorial infarcts.

No mass lesion or midline shift. No hydrocephalus. No extra-axial
fluid collection.

Craniocervical junction within normal limits. Pituitary gland
normal.

No acute abnormality about the orbits. Paranasal sinuses are clear.
No mastoid effusion. Inner ear structures are normal.

Cerebral atrophy with mild chronic small vessel ischemic changes
present in the periventricular white matter. Small vessel type
changes present within the pons.

MRA HEAD FINDINGS

ANTERIOR CIRCULATION:

Visualized distal cervical segments of the internal carotid arteries
are patent with antegrade flow. There is mildly attenuated flow
within the right ICA as compared to the left, likely related to the
critical stenosis proximally within the neck. This involves the
right MCA and its distal branches as well. The petrous and cavernous
segments of the internal carotid arteries are widely patent. Mild
narrowing of the supra clinoid right ICA. This extends into the
proximal right M1 and A1 segments. Left A1 segment is dominant.
Anterior communicating artery and anterior cerebral arteries patent.

Left M1 segment is widely patent. Left MCA bifurcation and distal
MCA branches well opacified.

There is somewhat attenuated and irregular flow within the right M1
segment without focal high-grade stenosis or frank occlusion.
Occlusion of the inferior division of the proximal right M2 branch
noted, better evaluated on prior CTA. Flow within the distal right
MCA branches is attenuated.

POSTERIOR CIRCULATION:

Vertebral arteries are patent to the vertebrobasilar junction.
Partially visualized posterior inferior cerebral arteries patent.
Basilar artery widely patent. Superior cerebellar arteries patent
proximally. Posterior cerebral arteries opacified to their distal
aspects. There is a moderate irregular stenosis within the proximal
right P2 segment.

No aneurysm.
IMPRESSION: MRI HEAD IMPRESSION:

1. Multi focal patchy acute right MCA territory infarcts. Overall
distribution is stable from prior CT. There is very minimal
associated petechial hemorrhage without evidence for hemorrhagic
conversion.
2. Additional 8 mm cortical infarct within the parasagittal cortical
gray matter of the anterior right frontal lobe, likely right ACA in
distribution.
3. Mild cerebral atrophy with chronic microvascular ischemic
disease.

MRA HEAD IMPRESSION:

1. Attenuated flow throughout the visualized right ICA and MCA
arteries, likely related to the critical stenosis seen proximally
within the neck on prior CTA. Flow within the right M1 segment is
thready and irregular, with attenuated distal right MCA branch flow.
2. Right MCA M2 inferior division occlusion, stable from prior CTA.
3. Irregular moderate right P2 stenosis.

## 2017-04-15 IMAGING — CR DG CHEST 1V PORT
1 series · 1 of 1 positions shown · non-contrast
Comparison: Chest CT 07/24/2014.  Chest radiograph of 05/19/2013

CLINICAL DATA: Central line placement.

EXAM:
PORTABLE CHEST - 1 VIEW

[AP]
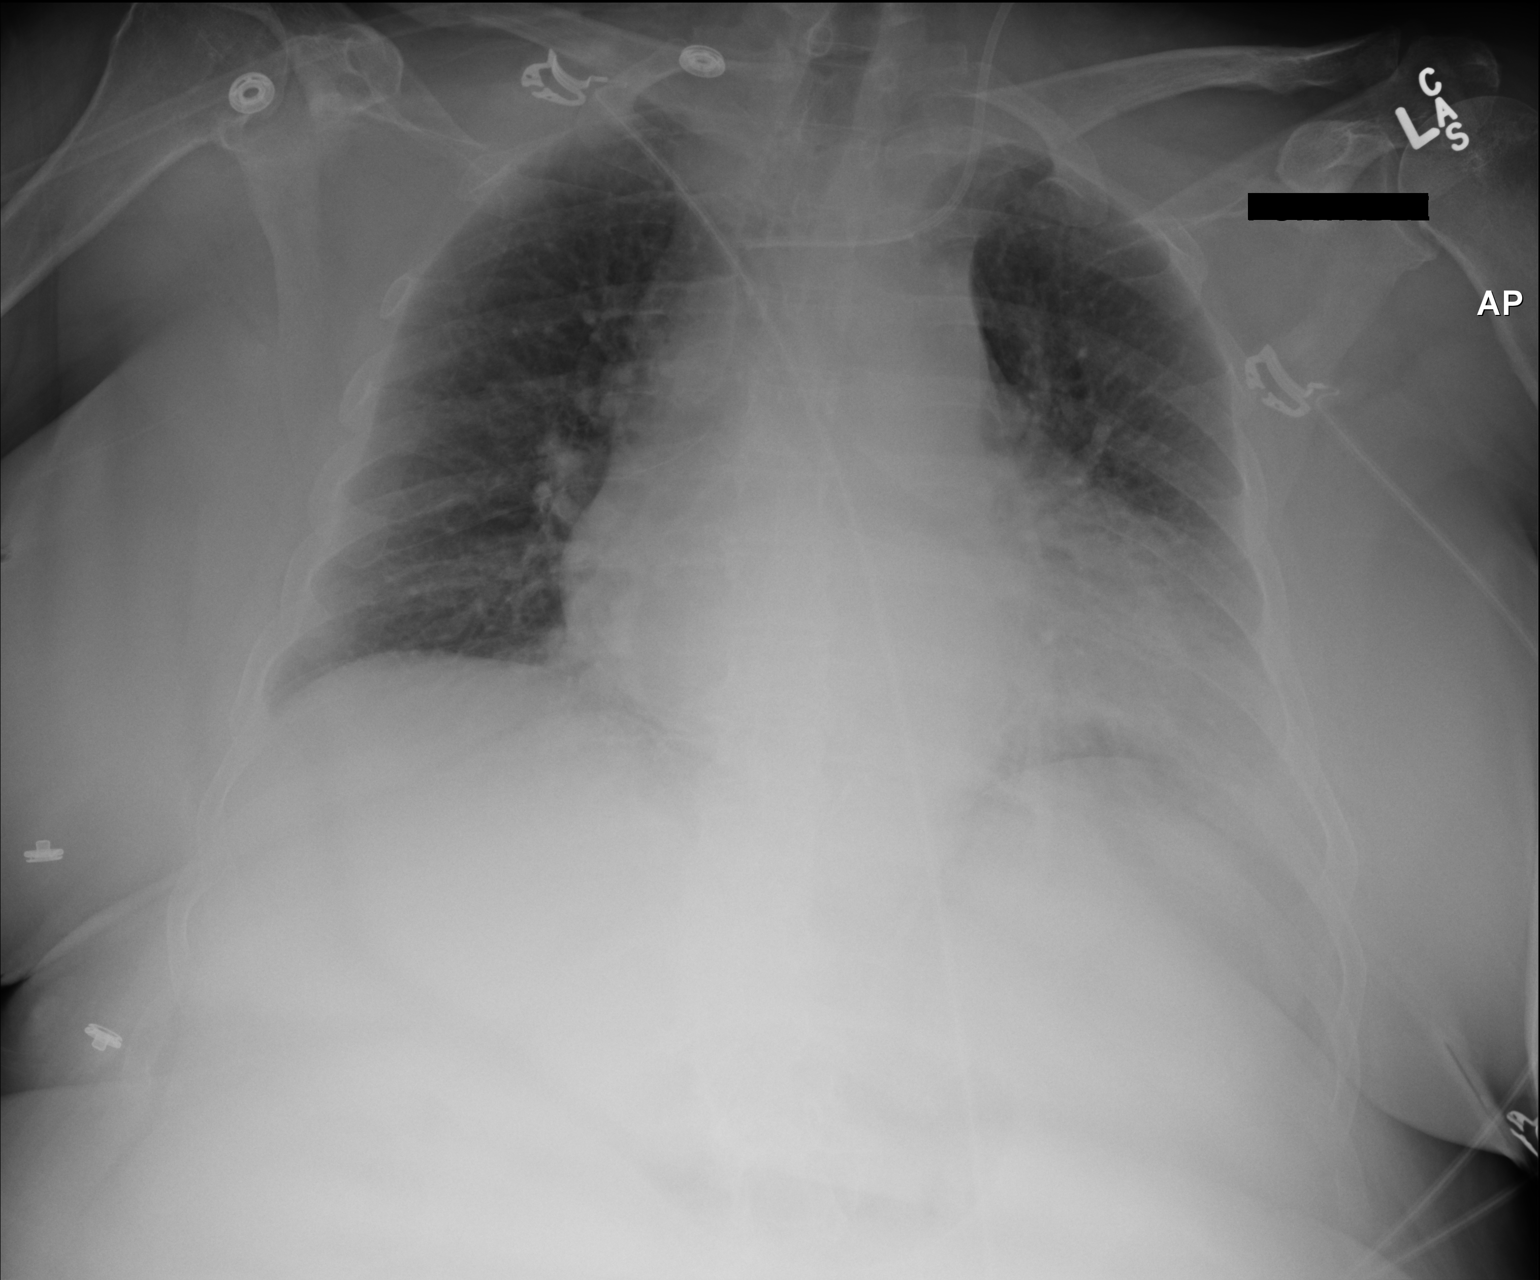

[1 of 1 positions shown; findings below may reference images not displayed]

FINDINGS: A left internal jugular line terminates at the confluence of the
brachiocephalic veins.

Midline trachea. Cardiomegaly accentuated by AP portable technique.
No pleural effusion or pneumothorax. Low lung volumes with resultant
pulmonary interstitial prominence. No lobar consolidation.
IMPRESSION: Left internal jugular line terminating at the confluence of the
brachiocephalic veins. If low SVC position is desired, consider
advancement approximately 5 cm with repeat imaging.

Cardiomegaly and low lung volumes, without pneumothorax or other
acute finding.

## 2017-04-17 DIAGNOSIS — E785 Hyperlipidemia, unspecified: Secondary | ICD-10-CM | POA: Diagnosis not present

## 2017-04-17 DIAGNOSIS — M1380 Other specified arthritis, unspecified site: Secondary | ICD-10-CM | POA: Diagnosis not present

## 2017-04-17 DIAGNOSIS — R531 Weakness: Secondary | ICD-10-CM | POA: Diagnosis not present

## 2017-04-21 ENCOUNTER — Telehealth: Payer: Self-pay | Admitting: Physician Assistant

## 2017-04-21 ENCOUNTER — Ambulatory Visit: Payer: Medicare HMO | Admitting: Physician Assistant

## 2017-04-21 VITALS — BP 164/95 | HR 68 | Temp 98.4°F | Resp 16 | Ht 62.0 in

## 2017-04-21 DIAGNOSIS — D649 Anemia, unspecified: Secondary | ICD-10-CM | POA: Diagnosis not present

## 2017-04-21 DIAGNOSIS — Z8673 Personal history of transient ischemic attack (TIA), and cerebral infarction without residual deficits: Secondary | ICD-10-CM

## 2017-04-21 DIAGNOSIS — I951 Orthostatic hypotension: Secondary | ICD-10-CM

## 2017-04-21 DIAGNOSIS — R011 Cardiac murmur, unspecified: Secondary | ICD-10-CM | POA: Diagnosis not present

## 2017-04-21 DIAGNOSIS — S72142A Displaced intertrochanteric fracture of left femur, initial encounter for closed fracture: Secondary | ICD-10-CM | POA: Diagnosis not present

## 2017-04-21 NOTE — Progress Notes (Signed)
PRIMARY CARE AT [facility] [address] [place] [address] [phone]  Date:  04/21/2017   Name:  Carla Hopkins   DOB:  1950/06/04   MRN:  [id number]  PCP:  Carla Bachelor, PA    History of Present Illness:  Carla Hopkins is a 67 y.o. female patient who presents to PCP with  Chief Complaint  Patient presents with  . Fall    pt fell in [place] in Dec and broke her leg/ she is following up.  . Leg Injury    left leg     Patient is here today after closed comminuted intertrochanteric fracture of left femur occurred while she was with her daughter in [place] about 5 weeks ago for the holidays.  This was a mechanical fall where she slipped in the bathroom.  No lightheadedness or presyncopal events.  This was repaired at [facility] with open reduction and internal fixation using cephalomedullary nail of left hip subtrochanteric-intertrochanteric fracture 03/20/2017.  She has undergone physical therapy.  She is able to use walker, bathe, and get to toilet.  No incontinence.  No dizziness. Following surgery, she was found to have orthostatic hypotension.  She was advised to have cardiology consult when she returned home.  She has a history of a murmur.  She has had no history however of cp, palpitations, presyncope, vision changes or leg swelling.  She has history of CVA with left sided weakness.  This has resolved, and blood pressure was elevated today.  She was advised to discontinue her BP med at that time.    Echo 12/23 SUMMARY:  1. EF > 75% - Hyperdynamic left ventricular systolic function.  2. Severe eccentric left ventricular hypertrophy .  3. Severe left atrial enlargement.  4. Calcification on ventricular side of posterior mitral valve leaflet.  5. Systolic Anterior Motion of the Mitral valve.  6. Mild left ventricular outflow tract (LVOT) obstruction at rest  7. Mild mitral valve regurgitation.  8. Grade I (impaired relaxation) left ventricular diastolic dysfunction.  9.  Normal pulmonary artery systolic pressure.  Patient Active Problem List   Diagnosis Date Noted  . History of CVA with residual deficit 12/30/2014  . Left hemiparesis ([place]) 10/13/2014  . Left-sided neglect 10/13/2014  . History of right MCA stroke 08/06/2014  . Carotid stenosis 08/05/2014  . Acute right MCA stroke ([place]) 07/30/2014  . Cerebral infarction due to thrombosis of right middle cerebral artery ([place])   . Thyroid activity decreased   . Left-sided weakness   . Stroke ([place])   . Essential hypertension   . Carotid artery stenosis   . CVA (cerebral infarction) 07/24/2014  . Hyperlipidemia 07/24/2014  . Diabetes ([place]) 07/24/2014  . Elevated troponin 07/24/2014  . Tobacco abuse 07/24/2014  . Benign essential HTN 07/23/2014  . Obstructive sleep apnea 07/23/2014  . Hypothyroidism 07/03/2014  . Type II or unspecified type diabetes mellitus without mention of complication, not stated as uncontrolled 09/25/2013  . BMI 38.0-38.9,adult 05/19/2013  . Nicotine addiction 05/19/2013  . Right atrial enlargement 05/19/2013    Past Medical History:  Diagnosis Date  . Hypertension   . Hypothyroidism   . Stroke [facility])     Past Surgical History:  Procedure Laterality Date  . ENDARTERECTOMY Right 08/05/2014   Procedure: Right Carotid ENDARTERECTOMY ;  Surgeon: Elam Dutch, MD;  Location: [place];  Service: Vascular;  Laterality: Right;  . PATCH ANGIOPLASTY Right 08/05/2014   Procedure: PATCH ANGIOPLASTY;  Surgeon: Elam Dutch, MD;  [facility] OR;  Service: Vascular;  Laterality: Right;  . TUBAL LIGATION      Social History   Tobacco Use  . Smoking status: Former Smoker    Packs/day: 20.00    Types: Cigarettes  . Smokeless tobacco: Never Used  . Tobacco comment: quit smoking July 24, 2014  Substance Use Topics  . Alcohol use: Yes    Alcohol/week: 0.0 oz    Comment: once yearly  . Drug use: No    Family History  Adopted: Yes  Problem Relation Age of Onset  . Colon  cancer Neg Hx     No Known Allergies  Medication list has been reviewed and updated.  Current Outpatient Medications on File Prior to Visit  Medication Sig Dispense Refill  . aspirin 81 MG chewable tablet Chew 1 tablet (81 mg total) by mouth daily.    Marland Kitchen atorvastatin (LIPITOR) 80 MG tablet TAKE 1 TABLET EVERY DAY 90 tablet 1  . baclofen (LIORESAL) 10 MG tablet Take 10 mg by mouth 3 (three) times daily.    Marland Kitchen levothyroxine (SYNTHROID, LEVOTHROID) 88 MCG tablet TAKE 1 TABLET EVERY DAY 90 tablet 1  . Multiple Vitamin (MULTIVITAMIN) tablet Take 1 tablet by mouth daily.    Marland Kitchen lisinopril (PRINIVIL,ZESTRIL) 10 MG tablet Take 1 tablet (10 mg total) by mouth daily. (Patient not taking: Reported on 04/21/2017) 90 tablet 1   Current Facility-Administered Medications on File Prior to Visit  Medication Dose Route Frequency Provider Last Rate Last Dose  . 0.9 %  sodium chloride infusion  500 mL Intravenous Continuous Danis, Estill Cotta III, MD        ROS ROS otherwise unremarkable unless listed above.  Physical Examination: BP (!) 164/95   Pulse 68   Temp 98.4 F (36.9 C) (Oral)   Resp 16   Ht '5\' 2"'  (1.575 m)   SpO2 100%   BMI 28.53 kg/m  Ideal Body Weight: Weight in (lb) to have BMI = 25: 136.4  Physical Exam  Constitutional: She is oriented to person, place, and time. She appears well-developed and well-nourished. No distress.  HENT:  Head: Normocephalic and atraumatic.  Right Ear: External ear normal.  Left Ear: External ear normal.  Eyes: Conjunctivae and EOM are normal. Pupils are equal, round, and reactive to light.  Cardiovascular: Normal rate and regular rhythm.  Murmur heard.  Systolic murmur is present with a grade of 3/6. Pulmonary/Chest: Effort normal. No respiratory distress.  Musculoskeletal:       Left hip: She exhibits decreased range of motion. She exhibits normal strength.  Antalgic gait with left side.   Neurological: She is alert and oriented to person, place, and  time.  Skin: She is not diaphoretic.  Psychiatric: She has a normal mood and affect. Her behavior is normal.     Assessment and Plan: Carla Hopkins is a 67 y.o. female who is here today for cc of  Chief Complaint  Patient presents with  . Fall    pt fell in [place] in Dec and broke her leg/ she is following up.  . Leg Injury    left leg   Referring to cardiology.  Comparatively no acute changes noted on current EKG.   She will follow up with an orthopedist and physical therapy.  Request at home physical therapy given her self transport and no one at the home.  We will seek this.  She will need to follow up with cardiology.   Closed comminuted intertrochanteric fracture of left femur,  initial encounter [facility]) - Plan: Ambulatory referral to Physical Therapy, AMB referral to orthopedics  Low hemoglobin - Plan: CMP14+EGFR, EKG 12-Lead, CBC  Orthostatic hypotension - Plan: CMP14+EGFR, EKG 12-Lead  History of stroke - Plan: Ambulatory referral to Physical Therapy, AMB referral to orthopedics  Heart murmur  Ivar Drape, PA-C [facility] 1/28/20198:29 AM

## 2017-04-21 NOTE — Telephone Encounter (Signed)
We will need OV notes closed to send urgent referrals for this pt. Spoke with [name] earlier and pt desires appt on Monday afternoon if possible. Will send as soon as closed. Thanks!

## 2017-04-21 NOTE — Patient Instructions (Addendum)
  Monday at 10:45am, be there at 10:15am with Woody Seller at [facility] at [address], [place], [place] [address]  Please await contact for the physical therapist and orthopedist.   Please restart the lisinopril at this time.  I will contact you via the results of your blood work.    IF you received an x-ray today, you will receive an invoice from [facility]. Please contact [facility] at [phone] with questions or concerns regarding your invoice.   IF you received labwork today, you will receive an invoice from [place]. Please contact [facility] at [phone] with questions or concerns regarding your invoice.   Our billing staff will not be able to assist you with questions regarding bills from these companies.  You will be contacted with the lab results as soon as they are available. The fastest way to get your results is to activate your My Chart account. Instructions are located on the last page of this paperwork. If you have not heard from [place] regarding the results in 2 weeks, please contact this office.

## 2017-04-22 LAB — CMP14+EGFR
ALK PHOS: 135 IU/L — AB (ref 39–117)
ALT: 14 IU/L (ref 0–32)
AST: 20 IU/L (ref 0–40)
Albumin/Globulin Ratio: 1.4 (ref 1.2–2.2)
Albumin: 3.7 g/dL (ref 3.6–4.8)
BILIRUBIN TOTAL: 0.4 mg/dL (ref 0.0–1.2)
BUN/Creatinine Ratio: 31 — ABNORMAL HIGH (ref 12–28)
BUN: 26 mg/dL (ref 8–27)
CHLORIDE: 106 mmol/L (ref 96–106)
CO2: 21 mmol/L (ref 20–29)
Calcium: 10 mg/dL (ref 8.7–10.3)
Creatinine, Ser: 0.85 mg/dL (ref 0.57–1.00)
GFR calc Af Amer: 82 mL/min/{1.73_m2} (ref >59)
GFR calc non Af Amer: 71 mL/min/{1.73_m2} (ref >59)
GLUCOSE: 115 mg/dL — AB (ref 65–99)
Globulin, Total: 2.6 g/dL (ref 1.5–4.5)
Potassium: 4.3 mmol/L (ref 3.5–5.2)
Sodium: 144 mmol/L (ref 134–144)
Total Protein: 6.3 g/dL (ref 6.0–8.5)

## 2017-04-22 LAB — CBC
Hematocrit: 35.8 % (ref 34.0–46.6)
Hemoglobin: 11.4 g/dL (ref 11.1–15.9)
MCH: 29 pg (ref 26.6–33.0)
MCHC: 31.8 g/dL (ref 31.5–35.7)
MCV: 91 fL (ref 79–97)
PLATELETS: 316 10*3/uL (ref 150–379)
RBC: 3.93 x10E6/uL (ref 3.77–5.28)
RDW: 15.2 % (ref 12.3–15.4)
WBC: 10.9 10*3/uL — AB (ref 3.4–10.8)

## 2017-04-24 ENCOUNTER — Encounter: Payer: Self-pay | Admitting: Physician Assistant

## 2017-04-24 DIAGNOSIS — I350 Nonrheumatic aortic (valve) stenosis: Secondary | ICD-10-CM | POA: Diagnosis not present

## 2017-04-24 DIAGNOSIS — I779 Disorder of arteries and arterioles, unspecified: Secondary | ICD-10-CM | POA: Diagnosis not present

## 2017-04-24 DIAGNOSIS — I951 Orthostatic hypotension: Secondary | ICD-10-CM | POA: Diagnosis not present

## 2017-04-24 DIAGNOSIS — Z8673 Personal history of transient ischemic attack (TIA), and cerebral infarction without residual deficits: Secondary | ICD-10-CM | POA: Diagnosis not present

## 2017-05-01 DIAGNOSIS — M6258 Muscle wasting and atrophy, not elsewhere classified, other site: Secondary | ICD-10-CM | POA: Diagnosis not present

## 2017-05-01 DIAGNOSIS — R262 Difficulty in walking, not elsewhere classified: Secondary | ICD-10-CM | POA: Diagnosis not present

## 2017-05-01 DIAGNOSIS — M25552 Pain in left hip: Secondary | ICD-10-CM | POA: Diagnosis not present

## 2017-05-01 DIAGNOSIS — M799 Soft tissue disorder, unspecified: Secondary | ICD-10-CM | POA: Diagnosis not present

## 2017-05-04 DIAGNOSIS — M25552 Pain in left hip: Secondary | ICD-10-CM | POA: Diagnosis not present

## 2017-05-04 DIAGNOSIS — R262 Difficulty in walking, not elsewhere classified: Secondary | ICD-10-CM | POA: Diagnosis not present

## 2017-05-04 DIAGNOSIS — M799 Soft tissue disorder, unspecified: Secondary | ICD-10-CM | POA: Diagnosis not present

## 2017-05-04 DIAGNOSIS — M6258 Muscle wasting and atrophy, not elsewhere classified, other site: Secondary | ICD-10-CM | POA: Diagnosis not present

## 2017-05-08 DIAGNOSIS — M6258 Muscle wasting and atrophy, not elsewhere classified, other site: Secondary | ICD-10-CM | POA: Diagnosis not present

## 2017-05-08 DIAGNOSIS — M25552 Pain in left hip: Secondary | ICD-10-CM | POA: Diagnosis not present

## 2017-05-08 DIAGNOSIS — R262 Difficulty in walking, not elsewhere classified: Secondary | ICD-10-CM | POA: Diagnosis not present

## 2017-05-08 DIAGNOSIS — M799 Soft tissue disorder, unspecified: Secondary | ICD-10-CM | POA: Diagnosis not present

## 2017-05-11 DIAGNOSIS — R262 Difficulty in walking, not elsewhere classified: Secondary | ICD-10-CM | POA: Diagnosis not present

## 2017-05-11 DIAGNOSIS — M799 Soft tissue disorder, unspecified: Secondary | ICD-10-CM | POA: Diagnosis not present

## 2017-05-11 DIAGNOSIS — M6258 Muscle wasting and atrophy, not elsewhere classified, other site: Secondary | ICD-10-CM | POA: Diagnosis not present

## 2017-05-11 DIAGNOSIS — M25552 Pain in left hip: Secondary | ICD-10-CM | POA: Diagnosis not present

## 2017-05-13 ENCOUNTER — Encounter: Payer: Self-pay | Admitting: Physician Assistant

## 2017-05-13 DIAGNOSIS — I350 Nonrheumatic aortic (valve) stenosis: Secondary | ICD-10-CM | POA: Insufficient documentation

## 2017-05-13 DIAGNOSIS — I951 Orthostatic hypotension: Secondary | ICD-10-CM | POA: Insufficient documentation

## 2017-05-16 DIAGNOSIS — M6258 Muscle wasting and atrophy, not elsewhere classified, other site: Secondary | ICD-10-CM | POA: Diagnosis not present

## 2017-05-16 DIAGNOSIS — M799 Soft tissue disorder, unspecified: Secondary | ICD-10-CM | POA: Diagnosis not present

## 2017-05-16 DIAGNOSIS — M25552 Pain in left hip: Secondary | ICD-10-CM | POA: Diagnosis not present

## 2017-05-16 DIAGNOSIS — R262 Difficulty in walking, not elsewhere classified: Secondary | ICD-10-CM | POA: Diagnosis not present

## 2017-05-18 DIAGNOSIS — R262 Difficulty in walking, not elsewhere classified: Secondary | ICD-10-CM | POA: Diagnosis not present

## 2017-05-18 DIAGNOSIS — M25552 Pain in left hip: Secondary | ICD-10-CM | POA: Diagnosis not present

## 2017-05-18 DIAGNOSIS — M6258 Muscle wasting and atrophy, not elsewhere classified, other site: Secondary | ICD-10-CM | POA: Diagnosis not present

## 2017-05-18 DIAGNOSIS — M799 Soft tissue disorder, unspecified: Secondary | ICD-10-CM | POA: Diagnosis not present

## 2017-05-23 DIAGNOSIS — R262 Difficulty in walking, not elsewhere classified: Secondary | ICD-10-CM | POA: Diagnosis not present

## 2017-05-23 DIAGNOSIS — M799 Soft tissue disorder, unspecified: Secondary | ICD-10-CM | POA: Diagnosis not present

## 2017-05-23 DIAGNOSIS — M6258 Muscle wasting and atrophy, not elsewhere classified, other site: Secondary | ICD-10-CM | POA: Diagnosis not present

## 2017-05-23 DIAGNOSIS — M25552 Pain in left hip: Secondary | ICD-10-CM | POA: Diagnosis not present

## 2017-05-26 DIAGNOSIS — M6258 Muscle wasting and atrophy, not elsewhere classified, other site: Secondary | ICD-10-CM | POA: Diagnosis not present

## 2017-05-26 DIAGNOSIS — R262 Difficulty in walking, not elsewhere classified: Secondary | ICD-10-CM | POA: Diagnosis not present

## 2017-05-26 DIAGNOSIS — M799 Soft tissue disorder, unspecified: Secondary | ICD-10-CM | POA: Diagnosis not present

## 2017-05-26 DIAGNOSIS — M25552 Pain in left hip: Secondary | ICD-10-CM | POA: Diagnosis not present

## 2017-05-30 DIAGNOSIS — M799 Soft tissue disorder, unspecified: Secondary | ICD-10-CM | POA: Diagnosis not present

## 2017-05-30 DIAGNOSIS — M25552 Pain in left hip: Secondary | ICD-10-CM | POA: Diagnosis not present

## 2017-05-30 DIAGNOSIS — M6258 Muscle wasting and atrophy, not elsewhere classified, other site: Secondary | ICD-10-CM | POA: Diagnosis not present

## 2017-05-30 DIAGNOSIS — R262 Difficulty in walking, not elsewhere classified: Secondary | ICD-10-CM | POA: Diagnosis not present

## 2017-06-02 DIAGNOSIS — M25552 Pain in left hip: Secondary | ICD-10-CM | POA: Diagnosis not present

## 2017-06-02 DIAGNOSIS — R262 Difficulty in walking, not elsewhere classified: Secondary | ICD-10-CM | POA: Diagnosis not present

## 2017-06-02 DIAGNOSIS — M799 Soft tissue disorder, unspecified: Secondary | ICD-10-CM | POA: Diagnosis not present

## 2017-06-02 DIAGNOSIS — M6258 Muscle wasting and atrophy, not elsewhere classified, other site: Secondary | ICD-10-CM | POA: Diagnosis not present

## 2017-06-05 ENCOUNTER — Telehealth: Payer: Self-pay

## 2017-06-05 NOTE — Telephone Encounter (Signed)
Patient seen for office visit on 04/21/2017 for Closed comminuted intertrochanteric fracture of left femur, initial encounter ([place]).   Provider, would you like to see patient for office visit for this or can we do this for her? Please advise.

## 2017-06-05 NOTE — Telephone Encounter (Signed)
Copied from [place] [id number]. Topic: General - Other >> Jun 01, 2017 11:24 AM Carla Hopkins, NT wrote: Reason for CRM: Patient would like to know if [name] would do the paper work for her to get a handicap sticker for her car  please call her with this information [phone]  >> Jun 05, 2017  9:18 AM Percell Belt A wrote: Pt called in to check the status of this.  She would like a call back at [phone]

## 2017-06-08 DIAGNOSIS — M799 Soft tissue disorder, unspecified: Secondary | ICD-10-CM | POA: Diagnosis not present

## 2017-06-08 DIAGNOSIS — M25552 Pain in left hip: Secondary | ICD-10-CM | POA: Diagnosis not present

## 2017-06-08 DIAGNOSIS — R262 Difficulty in walking, not elsewhere classified: Secondary | ICD-10-CM | POA: Diagnosis not present

## 2017-06-08 DIAGNOSIS — M6258 Muscle wasting and atrophy, not elsewhere classified, other site: Secondary | ICD-10-CM | POA: Diagnosis not present

## 2017-06-08 NOTE — Telephone Encounter (Signed)
Copied from [place] [id number]. Topic: General - Other >> Jun 08, 2017 10:05 AM Burnis Medin, NT wrote: Patient is calling back because she hadn't heard back about her handicap sticker. Pt is getting worried and would like a call back. Pt also said she is willing to come into the office.

## 2017-06-08 NOTE — Telephone Encounter (Signed)
Patient can submit paperwork.  This is something that has been generally placed in provider boxes.

## 2017-06-08 NOTE — Telephone Encounter (Signed)
Phone call to patient. Advised request has been sent to provider for review- we are waiting for provider response. She is agreeable.   Provider, please advise regarding paperwork for handicap sticker. OK to prepare for patient or does she need separate OV?

## 2017-06-09 DIAGNOSIS — R262 Difficulty in walking, not elsewhere classified: Secondary | ICD-10-CM | POA: Diagnosis not present

## 2017-06-09 DIAGNOSIS — M25552 Pain in left hip: Secondary | ICD-10-CM | POA: Diagnosis not present

## 2017-06-09 DIAGNOSIS — M6258 Muscle wasting and atrophy, not elsewhere classified, other site: Secondary | ICD-10-CM | POA: Diagnosis not present

## 2017-06-09 DIAGNOSIS — M799 Soft tissue disorder, unspecified: Secondary | ICD-10-CM | POA: Diagnosis not present

## 2017-06-12 NOTE — Telephone Encounter (Signed)
Phone call to patient. Confirmed patient information for form. Form generated and placed in provider box for signature. Clinic staff, please call patient when form has been placed at front and is ready for pickup.

## 2017-06-13 DIAGNOSIS — M6258 Muscle wasting and atrophy, not elsewhere classified, other site: Secondary | ICD-10-CM | POA: Diagnosis not present

## 2017-06-13 DIAGNOSIS — M799 Soft tissue disorder, unspecified: Secondary | ICD-10-CM | POA: Diagnosis not present

## 2017-06-13 DIAGNOSIS — R262 Difficulty in walking, not elsewhere classified: Secondary | ICD-10-CM | POA: Diagnosis not present

## 2017-06-13 DIAGNOSIS — M25552 Pain in left hip: Secondary | ICD-10-CM | POA: Diagnosis not present

## 2017-06-16 DIAGNOSIS — M25552 Pain in left hip: Secondary | ICD-10-CM | POA: Diagnosis not present

## 2017-06-16 DIAGNOSIS — M6258 Muscle wasting and atrophy, not elsewhere classified, other site: Secondary | ICD-10-CM | POA: Diagnosis not present

## 2017-06-16 DIAGNOSIS — M799 Soft tissue disorder, unspecified: Secondary | ICD-10-CM | POA: Diagnosis not present

## 2017-06-16 DIAGNOSIS — R262 Difficulty in walking, not elsewhere classified: Secondary | ICD-10-CM | POA: Diagnosis not present

## 2017-06-20 DIAGNOSIS — M25552 Pain in left hip: Secondary | ICD-10-CM | POA: Diagnosis not present

## 2017-06-20 DIAGNOSIS — R262 Difficulty in walking, not elsewhere classified: Secondary | ICD-10-CM | POA: Diagnosis not present

## 2017-06-20 DIAGNOSIS — M799 Soft tissue disorder, unspecified: Secondary | ICD-10-CM | POA: Diagnosis not present

## 2017-06-20 DIAGNOSIS — M6258 Muscle wasting and atrophy, not elsewhere classified, other site: Secondary | ICD-10-CM | POA: Diagnosis not present

## 2017-06-22 DIAGNOSIS — M25552 Pain in left hip: Secondary | ICD-10-CM | POA: Diagnosis not present

## 2017-06-22 DIAGNOSIS — M799 Soft tissue disorder, unspecified: Secondary | ICD-10-CM | POA: Diagnosis not present

## 2017-06-22 DIAGNOSIS — R262 Difficulty in walking, not elsewhere classified: Secondary | ICD-10-CM | POA: Diagnosis not present

## 2017-06-22 DIAGNOSIS — M6258 Muscle wasting and atrophy, not elsewhere classified, other site: Secondary | ICD-10-CM | POA: Diagnosis not present

## 2017-06-27 DIAGNOSIS — Z8673 Personal history of transient ischemic attack (TIA), and cerebral infarction without residual deficits: Secondary | ICD-10-CM | POA: Diagnosis not present

## 2017-06-27 DIAGNOSIS — I951 Orthostatic hypotension: Secondary | ICD-10-CM | POA: Diagnosis not present

## 2017-06-27 DIAGNOSIS — I422 Other hypertrophic cardiomyopathy: Secondary | ICD-10-CM | POA: Diagnosis not present

## 2017-06-27 DIAGNOSIS — I779 Disorder of arteries and arterioles, unspecified: Secondary | ICD-10-CM | POA: Diagnosis not present

## 2017-06-28 ENCOUNTER — Encounter: Payer: Self-pay | Admitting: Physician Assistant

## 2017-06-28 DIAGNOSIS — M25552 Pain in left hip: Secondary | ICD-10-CM | POA: Diagnosis not present

## 2017-06-28 DIAGNOSIS — M6258 Muscle wasting and atrophy, not elsewhere classified, other site: Secondary | ICD-10-CM | POA: Diagnosis not present

## 2017-06-28 DIAGNOSIS — R262 Difficulty in walking, not elsewhere classified: Secondary | ICD-10-CM | POA: Diagnosis not present

## 2017-06-28 DIAGNOSIS — M799 Soft tissue disorder, unspecified: Secondary | ICD-10-CM | POA: Diagnosis not present

## 2017-06-29 DIAGNOSIS — M6258 Muscle wasting and atrophy, not elsewhere classified, other site: Secondary | ICD-10-CM | POA: Diagnosis not present

## 2017-06-29 DIAGNOSIS — M799 Soft tissue disorder, unspecified: Secondary | ICD-10-CM | POA: Diagnosis not present

## 2017-06-29 DIAGNOSIS — M25552 Pain in left hip: Secondary | ICD-10-CM | POA: Diagnosis not present

## 2017-06-29 DIAGNOSIS — R262 Difficulty in walking, not elsewhere classified: Secondary | ICD-10-CM | POA: Diagnosis not present

## 2017-07-04 DIAGNOSIS — M799 Soft tissue disorder, unspecified: Secondary | ICD-10-CM | POA: Diagnosis not present

## 2017-07-04 DIAGNOSIS — M6258 Muscle wasting and atrophy, not elsewhere classified, other site: Secondary | ICD-10-CM | POA: Diagnosis not present

## 2017-07-04 DIAGNOSIS — R262 Difficulty in walking, not elsewhere classified: Secondary | ICD-10-CM | POA: Diagnosis not present

## 2017-07-04 DIAGNOSIS — M25552 Pain in left hip: Secondary | ICD-10-CM | POA: Diagnosis not present

## 2017-07-05 ENCOUNTER — Telehealth: Payer: Self-pay | Admitting: Physician Assistant

## 2017-07-05 DIAGNOSIS — E2839 Other primary ovarian failure: Secondary | ICD-10-CM

## 2017-07-05 NOTE — Telephone Encounter (Signed)
Copied from [place] [phone]. Topic: Quick Communication - See Telephone Encounter >> Jul 05, 2017  9:59 AM Cleaster Corin, NT wrote: CRM for notification. See Telephone encounter for: 07/05/17.  Vicente Males calling from [facility] to see if pt. Has had her bone density scan if not seeing if Dr. Tamala Julian can go ahead and approve to have one scheduled. Vicente Males can be reached at [phone]

## 2017-07-06 DIAGNOSIS — M25552 Pain in left hip: Secondary | ICD-10-CM | POA: Diagnosis not present

## 2017-07-06 DIAGNOSIS — M799 Soft tissue disorder, unspecified: Secondary | ICD-10-CM | POA: Diagnosis not present

## 2017-07-06 DIAGNOSIS — M6258 Muscle wasting and atrophy, not elsewhere classified, other site: Secondary | ICD-10-CM | POA: Diagnosis not present

## 2017-07-06 DIAGNOSIS — R262 Difficulty in walking, not elsewhere classified: Secondary | ICD-10-CM | POA: Diagnosis not present

## 2017-07-11 DIAGNOSIS — M6258 Muscle wasting and atrophy, not elsewhere classified, other site: Secondary | ICD-10-CM | POA: Diagnosis not present

## 2017-07-11 DIAGNOSIS — M25552 Pain in left hip: Secondary | ICD-10-CM | POA: Diagnosis not present

## 2017-07-11 DIAGNOSIS — M799 Soft tissue disorder, unspecified: Secondary | ICD-10-CM | POA: Diagnosis not present

## 2017-07-11 DIAGNOSIS — R262 Difficulty in walking, not elsewhere classified: Secondary | ICD-10-CM | POA: Diagnosis not present

## 2017-07-13 DIAGNOSIS — M799 Soft tissue disorder, unspecified: Secondary | ICD-10-CM | POA: Diagnosis not present

## 2017-07-13 DIAGNOSIS — M25552 Pain in left hip: Secondary | ICD-10-CM | POA: Diagnosis not present

## 2017-07-13 DIAGNOSIS — R262 Difficulty in walking, not elsewhere classified: Secondary | ICD-10-CM | POA: Diagnosis not present

## 2017-07-13 DIAGNOSIS — M6258 Muscle wasting and atrophy, not elsewhere classified, other site: Secondary | ICD-10-CM | POA: Diagnosis not present

## 2017-07-18 DIAGNOSIS — M799 Soft tissue disorder, unspecified: Secondary | ICD-10-CM | POA: Diagnosis not present

## 2017-07-18 DIAGNOSIS — R262 Difficulty in walking, not elsewhere classified: Secondary | ICD-10-CM | POA: Diagnosis not present

## 2017-07-18 DIAGNOSIS — M25552 Pain in left hip: Secondary | ICD-10-CM | POA: Diagnosis not present

## 2017-07-18 DIAGNOSIS — M6258 Muscle wasting and atrophy, not elsewhere classified, other site: Secondary | ICD-10-CM | POA: Diagnosis not present

## 2017-07-19 NOTE — Telephone Encounter (Signed)
Melissa from [facility] called to see if the pt is going to have a bone density scheduled or if its been done already, contact [employer] to advise, number located in the "contact" section

## 2017-07-20 DIAGNOSIS — M25552 Pain in left hip: Secondary | ICD-10-CM | POA: Diagnosis not present

## 2017-07-20 DIAGNOSIS — R262 Difficulty in walking, not elsewhere classified: Secondary | ICD-10-CM | POA: Diagnosis not present

## 2017-07-20 DIAGNOSIS — M6258 Muscle wasting and atrophy, not elsewhere classified, other site: Secondary | ICD-10-CM | POA: Diagnosis not present

## 2017-07-20 DIAGNOSIS — M799 Soft tissue disorder, unspecified: Secondary | ICD-10-CM | POA: Diagnosis not present

## 2017-07-25 DIAGNOSIS — M6258 Muscle wasting and atrophy, not elsewhere classified, other site: Secondary | ICD-10-CM | POA: Diagnosis not present

## 2017-07-25 DIAGNOSIS — M799 Soft tissue disorder, unspecified: Secondary | ICD-10-CM | POA: Diagnosis not present

## 2017-07-25 DIAGNOSIS — M25552 Pain in left hip: Secondary | ICD-10-CM | POA: Diagnosis not present

## 2017-07-25 DIAGNOSIS — R262 Difficulty in walking, not elsewhere classified: Secondary | ICD-10-CM | POA: Diagnosis not present

## 2017-07-25 NOTE — Telephone Encounter (Signed)
Please call pt --- she is due for Annual Wellness Visit; please schedule on RN schedule.  She is also due for Complete Physical Examination and follow-up of chronic medical conditions (hypertension, high cholesterol, glucose intolerance); has she decided which provider she wants to see now that PA English is gone?  If yes, please schedule with that provider. If no, please schedule with one of the other providers.  She is also due for a bone density scan and her insurance is calling requesting that she undergo a bone density scan due to her hip fracture in December 2018.  I have placed order for bone density scan.

## 2017-07-25 NOTE — Telephone Encounter (Signed)
Left message with Vicente Males of [facility].  Bone density scan ordered.

## 2017-07-27 DIAGNOSIS — M799 Soft tissue disorder, unspecified: Secondary | ICD-10-CM | POA: Diagnosis not present

## 2017-07-27 DIAGNOSIS — R262 Difficulty in walking, not elsewhere classified: Secondary | ICD-10-CM | POA: Diagnosis not present

## 2017-07-27 DIAGNOSIS — M6258 Muscle wasting and atrophy, not elsewhere classified, other site: Secondary | ICD-10-CM | POA: Diagnosis not present

## 2017-07-27 DIAGNOSIS — M25552 Pain in left hip: Secondary | ICD-10-CM | POA: Diagnosis not present

## 2017-08-01 DIAGNOSIS — M25552 Pain in left hip: Secondary | ICD-10-CM | POA: Diagnosis not present

## 2017-08-01 DIAGNOSIS — M6258 Muscle wasting and atrophy, not elsewhere classified, other site: Secondary | ICD-10-CM | POA: Diagnosis not present

## 2017-08-01 DIAGNOSIS — R262 Difficulty in walking, not elsewhere classified: Secondary | ICD-10-CM | POA: Diagnosis not present

## 2017-08-01 DIAGNOSIS — M799 Soft tissue disorder, unspecified: Secondary | ICD-10-CM | POA: Diagnosis not present

## 2017-08-03 DIAGNOSIS — M6258 Muscle wasting and atrophy, not elsewhere classified, other site: Secondary | ICD-10-CM | POA: Diagnosis not present

## 2017-08-03 DIAGNOSIS — M25552 Pain in left hip: Secondary | ICD-10-CM | POA: Diagnosis not present

## 2017-08-03 DIAGNOSIS — M799 Soft tissue disorder, unspecified: Secondary | ICD-10-CM | POA: Diagnosis not present

## 2017-08-03 DIAGNOSIS — R262 Difficulty in walking, not elsewhere classified: Secondary | ICD-10-CM | POA: Diagnosis not present

## 2017-08-15 DIAGNOSIS — M25552 Pain in left hip: Secondary | ICD-10-CM | POA: Diagnosis not present

## 2017-08-15 DIAGNOSIS — M799 Soft tissue disorder, unspecified: Secondary | ICD-10-CM | POA: Diagnosis not present

## 2017-08-15 DIAGNOSIS — R262 Difficulty in walking, not elsewhere classified: Secondary | ICD-10-CM | POA: Diagnosis not present

## 2017-08-15 DIAGNOSIS — M6258 Muscle wasting and atrophy, not elsewhere classified, other site: Secondary | ICD-10-CM | POA: Diagnosis not present

## 2017-08-17 DIAGNOSIS — M6258 Muscle wasting and atrophy, not elsewhere classified, other site: Secondary | ICD-10-CM | POA: Diagnosis not present

## 2017-08-17 DIAGNOSIS — M25552 Pain in left hip: Secondary | ICD-10-CM | POA: Diagnosis not present

## 2017-08-17 DIAGNOSIS — M799 Soft tissue disorder, unspecified: Secondary | ICD-10-CM | POA: Diagnosis not present

## 2017-08-17 DIAGNOSIS — R262 Difficulty in walking, not elsewhere classified: Secondary | ICD-10-CM | POA: Diagnosis not present

## 2017-08-22 DIAGNOSIS — M799 Soft tissue disorder, unspecified: Secondary | ICD-10-CM | POA: Diagnosis not present

## 2017-08-22 DIAGNOSIS — M6258 Muscle wasting and atrophy, not elsewhere classified, other site: Secondary | ICD-10-CM | POA: Diagnosis not present

## 2017-08-22 DIAGNOSIS — M25552 Pain in left hip: Secondary | ICD-10-CM | POA: Diagnosis not present

## 2017-08-22 DIAGNOSIS — R262 Difficulty in walking, not elsewhere classified: Secondary | ICD-10-CM | POA: Diagnosis not present

## 2017-08-24 DIAGNOSIS — M6258 Muscle wasting and atrophy, not elsewhere classified, other site: Secondary | ICD-10-CM | POA: Diagnosis not present

## 2017-08-24 DIAGNOSIS — M25552 Pain in left hip: Secondary | ICD-10-CM | POA: Diagnosis not present

## 2017-08-24 DIAGNOSIS — M799 Soft tissue disorder, unspecified: Secondary | ICD-10-CM | POA: Diagnosis not present

## 2017-08-24 DIAGNOSIS — R262 Difficulty in walking, not elsewhere classified: Secondary | ICD-10-CM | POA: Diagnosis not present

## 2017-09-08 ENCOUNTER — Encounter: Payer: Self-pay | Admitting: Family

## 2017-09-08 ENCOUNTER — Ambulatory Visit: Payer: Medicare HMO | Admitting: Family

## 2017-09-08 ENCOUNTER — Ambulatory Visit (HOSPITAL_COMMUNITY)
Admission: RE | Admit: 2017-09-08 | Discharge: 2017-09-08 | Disposition: A | Discharge status: Home / Self Care | Payer: Medicare HMO | Source: Ambulatory Visit | Attending: Family | Admitting: Family

## 2017-09-08 VITALS — BP 130/86 | HR 97 | Temp 97.1°F | Ht 62.5 in | Wt 152.0 lb

## 2017-09-08 DIAGNOSIS — Z8673 Personal history of transient ischemic attack (TIA), and cerebral infarction without residual deficits: Secondary | ICD-10-CM | POA: Diagnosis not present

## 2017-09-08 DIAGNOSIS — E1151 Type 2 diabetes mellitus with diabetic peripheral angiopathy without gangrene: Secondary | ICD-10-CM | POA: Diagnosis not present

## 2017-09-08 DIAGNOSIS — I6521 Occlusion and stenosis of right carotid artery: Secondary | ICD-10-CM | POA: Insufficient documentation

## 2017-09-08 DIAGNOSIS — Z9889 Other specified postprocedural states: Secondary | ICD-10-CM

## 2017-09-08 DIAGNOSIS — I69954 Hemiplegia and hemiparesis following unspecified cerebrovascular disease affecting left non-dominant side: Secondary | ICD-10-CM | POA: Diagnosis not present

## 2017-09-08 NOTE — Patient Instructions (Signed)

## 2017-09-08 NOTE — Progress Notes (Signed)
Chief Complaint: Follow up Extracranial Carotid Artery Stenosis   History of Present Illness  Carla Hopkins is a 66 y.o. female who had a stroke to the right brain in April 2016. She subsequently underwent right carotid endarterectomy in May 2016 by Dr. Oneida Alar.   She has some residual weakness in left arm and leg.  She has had no further events since then. She currently is on aspirin. She still has some residual left upper extremity weakness. She is also followed by Dr. Leonie Man with neurology. Other chronic medical problem hypertension which is currently stable.  Review of systems: She denies shortness of breath. She denies chest pain. She denies claudication symptoms with walking.   She fractured her femur in December 2018 while visiting her daughter in [place], has surgery to repair; is walking with a cane.  She states after this surgery she became very orthostatic, her hypertension meds were stopped.   Diabetic: yes, A1C was 5.8 on 01-09-17 (review of records) Tobacco use: former smoker, quit April 2016   Serum creatinine 0.85 on 04-21-17  Pt meds include: Statin : yes ASA: yes Other anticoagulants/antiplatelets: no    Past Medical History:  Diagnosis Date  . Hypertension   . Hypothyroidism   . Stroke [facility])     Social History Social History   Tobacco Use  . Smoking status: Former Smoker    Packs/day: 20.00    Types: Cigarettes  . Smokeless tobacco: Never Used  . Tobacco comment: quit smoking July 24, 2014  Substance Use Topics  . Alcohol use: Yes    Alcohol/week: 0.0 oz    Comment: once yearly  . Drug use: No    Family History Family History  Adopted: Yes  Problem Relation Age of Onset  . Colon cancer Neg Hx     Surgical History Past Surgical History:  Procedure Laterality Date  . ENDARTERECTOMY Right 08/05/2014   Procedure: Right Carotid ENDARTERECTOMY ;  Surgeon: Elam Dutch, MD;  Location: [place];  Service: Vascular;  Laterality: Right;  . LEG  SURGERY    . PATCH ANGIOPLASTY Right 08/05/2014   Procedure: PATCH ANGIOPLASTY;  Surgeon: Elam Dutch, MD;  Location: [place];  Service: Vascular;  Laterality: Right;  . TUBAL LIGATION      No Known Allergies  Current Outpatient Medications  Medication Sig Dispense Refill  . aspirin 81 MG chewable tablet Chew 1 tablet (81 mg total) by mouth daily.    Marland Kitchen atorvastatin (LIPITOR) 80 MG tablet TAKE 1 TABLET EVERY DAY 90 tablet 1  . levothyroxine (SYNTHROID, LEVOTHROID) 88 MCG tablet TAKE 1 TABLET EVERY DAY 90 tablet 1  . Multiple Vitamin (MULTIVITAMIN) tablet Take 1 tablet by mouth daily.     Current Facility-Administered Medications  Medication Dose Route Frequency Provider Last Rate Last Dose  . 0.9 %  sodium chloride infusion  500 mL Intravenous Continuous Danis, Estill Cotta III, MD        Review of Systems : See HPI for pertinent positives and negatives.  Physical Examination  Vitals:   09/08/17 1303 09/08/17 1308  BP: 134/90 130/86  Pulse: 97   Temp: (!) 97.1 F (36.2 C)   SpO2: 97%   Weight: 152 lb (68.9 kg)   Height: 5' 2.5" (1.588 m)    Body mass index is 27.36 kg/m.  General: WDWN female in NAD GAIT:walks with a cane since left femur fx in December 2018 Eyes: PERRLA Pulmonary: Respirations are non-labored, CTAB Cardiac: regular rhythm, + murmur.  VASCULAR EXAM Carotid Bruits Right Left   Negative Negative   Abdominal aortic pulse not palpable. Radial pulses are 2+ palpable and =.   LE Pulses Right Left  POPLITEAL not palpable  not palpable  POSTERIOR TIBIAL palpable  palpable   DORSALIS PEDIS ANTERIOR TIBIAL palpable  palpable     Gastrointestinal: soft, nontender, BS WNL, no r/g, no palpable masses. Musculoskeletal: no muscle atrophy/wasting. M/S 5/5 throughout except 3/5 in left UE, 4/5 in left LE, extremities without ischemic changes. Neurologic: A&O X 3; Appropriate Affect, speech  is normal, CN 2-12 intact, pain and light touch intact in extremities, Motor exam as listed above. Skin: No rashes, no ulcers, no cellulitis.   Psychiatric: Normal thought content, mood appropriate to clinical situation.    Assessment: Carla Hopkins is a 67 y.o. female who is s/p right carotid endarterectomy in May 2016. She had a preoperative stroke and has moderate residual left upper extremity weakness. She has had no subsequent stroke or TIA.  Her DM remains in excellent control and she quit smoking in 2016.  She takes a daily statin and ASA.   DATA Carotid Duplex (09/08/17) Right ICA: CEA site with 1-39% stenosis Left ICA: 1-39% stenosis Bilateral vertebral artery flow is antegrade.  Bilateral subclavian artery waveforms are normal.  No significant change compared to exams on 02-26-15, 09-03-15, and 09-08-16.    Plan: Follow-up in 1year with Carotid Duplex scan.    I discussed in depth with the patient the nature of atherosclerosis, and emphasized the importance of maximal medical management including strict control of blood pressure, blood glucose, and lipid levels, obtaining regular exercise, and continued cessation of smoking.  The patient is aware that without maximal medical management the underlying atherosclerotic disease process will progress, limiting the benefit of any interventions. The patient was given information about stroke prevention and what symptoms should prompt the patient to seek immediate medical care. Thank you for allowing [place] to participate in this patient's care.  Clemon Chambers, RN, MSN, FNP-C [facility] Office: [phone]  Clinic Physician: Donzetta Matters on call  09/08/17 1:15 PM

## 2017-10-23 ENCOUNTER — Other Ambulatory Visit: Payer: Self-pay | Admitting: Physician Assistant

## 2017-10-23 DIAGNOSIS — E782 Mixed hyperlipidemia: Secondary | ICD-10-CM

## 2017-10-23 DIAGNOSIS — E034 Atrophy of thyroid (acquired): Secondary | ICD-10-CM

## 2017-10-23 NOTE — Telephone Encounter (Signed)
Copied from [place] [phone]. Topic: General - Other >> Oct 23, 2017  4:11 PM Oneta Rack wrote:  Relation to pt: self  Call back [phone] Pharmacy: [facility] [place], [place] [phone] (Phone) [phone] (Fax)   Reason for call:  Patient requesting atorvastatin (LIPITOR) 80 MG tablet and levothyroxine (SYNTHROID, LEVOTHROID) 88 MCG tablet,informed patient please allow 48 to 72 hour turn around time. Patient scheduled transfer of care appointment with Delia Chimes for 11/08/17.

## 2017-10-24 MED ORDER — LEVOTHYROXINE SODIUM 88 MCG PO TABS: ORAL_TABLET | ORAL | 0 refills | Status: DC

## 2017-10-24 MED ORDER — ATORVASTATIN CALCIUM 80 MG PO TABS: ORAL_TABLET | ORAL | 0 refills | Status: DC

## 2017-10-24 NOTE — Telephone Encounter (Signed)
Copied from [place] [id number]. Topic: General - Other >> Oct 23, 2017  4:11 PM Oneta Rack wrote: Relation to pt: self  Call back number:[phone] Pharmacy: [facility] [place] [place] [phone] (Phone) [phone] (Fax)   Reason for call:  Patient requesting atorvastatin (LIPITOR) 80 MG tablet and levothyroxine (SYNTHROID, LEVOTHROID) 88 MCG tablet,informed patient please allow 48 to 72 hour turn around time. Patient scheduled transfer of care appointment with Delia Chimes for 11/08/17.

## 2017-11-07 NOTE — Progress Notes (Signed)
Chief Complaint  Patient presents with  . transition of care-former English pt    HPI  Hypothyroidism: Patient presents for evaluation of thyroid function. Symptoms consist of denies fatigue, weight changes, heat/cold intolerance, bowel/skin changes or CVS symptoms.Previous thyroid studies include TSH. The hypothyroidism is due to hypothyroidism. She denies missing any doses and takes her medication on an empty stomach  Dyslipidemia: Patient presents for evaluation of lipids.  Compliance with treatment thus far has been good.  A repeat fasting lipid profile was done.  The patient does not use medications that may worsen dyslipidemias (corticosteroids, progestins, anabolic steroids, diuretics, beta-blockers, amiodarone, cyclosporine, olanzapine). The patient is known to have coexisting coronary artery disease.  She has made major changes in her diet She reports that she lost 70 pounds and kept it off for 3 years Lab Results  Component Value Date   TSH 3.780 01/09/2017   Lab Results  Component Value Date   CHOL 161 07/12/2016   CHOL 146 01/30/2015   CHOL 317 (H) 07/25/2014   Lab Results  Component Value Date   HDL 41 07/12/2016   HDL 40 (L) 01/30/2015   HDL 46 07/25/2014   Lab Results  Component Value Date   LDLCALC 95 07/12/2016   LDLCALC 80 01/30/2015   LDLCALC 240 (H) 07/25/2014   Lab Results  Component Value Date   TRIG 126 07/12/2016   TRIG 128 01/30/2015   TRIG 154 (H) 07/25/2014   Lab Results  Component Value Date   CHOLHDL 3.9 07/12/2016   CHOLHDL 3.7 01/30/2015   CHOLHDL 6.9 07/25/2014   No results found for: LDLDIRECT   Prediabetes Pt reports that she has a history of diabetes diagnosed with diabetes by the ER due to her stroke She reports that she has no history of idabetes and takes no medications for it She denies symptoms of low sugars She eats a healthy diet Component     Latest Ref Rng & Units 10/28/2014 01/30/2015 08/11/2015  Hemoglobin A1C  4.8 - 5.6 % 6.0 (H) 6.1 (H) 5.9 (H)  Mean Plasma Glucose     mg/dL 126 (H) 128 (H) 123  Est. average glucose Bld gHb Est-mCnc     mg/dL      Component     Latest Ref Rng & Units 07/12/2016 01/09/2017  Hemoglobin A1C     4.8 - 5.6 % 5.7 (H) 5.8 (H)  Mean Plasma Glucose     mg/dL    Est. average glucose Bld gHb Est-mCnc     mg/dL 117 120   Past Medical History:  Diagnosis Date  . Hypertension   . Hypothyroidism   . Stroke [facility])     Current Outpatient Medications  Medication Sig Dispense Refill  . aspirin 81 MG chewable tablet Chew 1 tablet (81 mg total) by mouth daily.    Marland Kitchen atorvastatin (LIPITOR) 80 MG tablet TAKE 1 TABLET EVERY DAY 90 tablet 3  . levothyroxine (SYNTHROID, LEVOTHROID) 88 MCG tablet TAKE 1 TABLET EVERY DAY. Take on an empty stomach first thing in the morning. Not with any other meds 90 tablet 3  . Multiple Vitamin (MULTIVITAMIN) tablet Take 1 tablet by mouth daily.     Current Facility-Administered Medications  Medication Dose Route Frequency Provider Last Rate Last Dose  . 0.9 %  sodium chloride infusion  500 mL Intravenous Continuous Danis, Kirke Corin, MD        Allergies: No Known Allergies  Past Surgical History:  Procedure Laterality Date  .  ENDARTERECTOMY Right 08/05/2014   Procedure: Right Carotid ENDARTERECTOMY ;  Surgeon: Elam Dutch, MD;  Location: [place];  Service: Vascular;  Laterality: Right;  . LEG SURGERY    . PATCH ANGIOPLASTY Right 08/05/2014   Procedure: PATCH ANGIOPLASTY;  Surgeon: Elam Dutch, MD;  Location: [place];  Service: Vascular;  Laterality: Right;  . TUBAL LIGATION      Social History   Socioeconomic History  . Marital status: Single    Spouse name: Not on file  . Number of children: Not on file  . Years of education: Not on file  . Highest education level: Not on file  Occupational History  . Not on file  Social Needs  . Financial resource strain: Not on file  . Food insecurity:    Worry: Not on file     Inability: Not on file  . Transportation needs:    Medical: Not on file    Non-medical: Not on file  Tobacco Use  . Smoking status: Former Smoker    Packs/day: 20.00    Types: Cigarettes  . Smokeless tobacco: Never Used  . Tobacco comment: quit smoking July 24, 2014  Substance and Sexual Activity  . Alcohol use: Yes    Alcohol/week: 0.0 standard drinks    Comment: once yearly  . Drug use: No  . Sexual activity: Not Currently  Lifestyle  . Physical activity:    Days per week: Not on file    Minutes per session: Not on file  . Stress: Not on file  Relationships  . Social connections:    Talks on phone: Not on file    Gets together: Not on file    Attends religious service: Not on file    Active member of club or organization: Not on file    Attends meetings of clubs or organizations: Not on file    Relationship status: Not on file  Other Topics Concern  . Not on file  Social History Narrative  . Not on file    Family History  Adopted: Yes  Problem Relation Age of Onset  . Colon cancer Neg Hx      ROS Review of Systems See HPI Constitution: No fevers or chills No malaise No diaphoresis Skin: No rash or itching Eyes: no blurry vision, no double vision GU: no dysuria or hematuria Neuro: no dizziness or headaches all others reviewed and negative   Objective: Vitals:   11/08/17 1320  BP: 134/82  Pulse: (!) 104  Resp: 17  Temp: 98.7 F (37.1 C)  TempSrc: Oral  SpO2: 95%  Weight: 151 lb 6.4 oz (68.7 kg)  Height: 5' 2.5" (1.588 m)    Physical Exam  Constitutional: She is oriented to person, place, and time. She appears well-developed and well-nourished.  HENT:  Head: Normocephalic and atraumatic.  Eyes: Conjunctivae and EOM are normal.  Neck: Normal range of motion. No thyromegaly present.  Cardiovascular: Normal rate and regular rhythm.  Murmur heard. Pulmonary/Chest: Effort normal and breath sounds normal. No stridor. No respiratory distress. She  has no wheezes.  Musculoskeletal:  Limp on left side, left side slightly weak from stroke, motor 4.5/5 on left lower extremity  Neurological: She is alert and oriented to person, place, and time.  Skin: Skin is warm. Capillary refill takes less than 2 seconds.  Psychiatric: She has a normal mood and affect. Her behavior is normal. Judgment and thought content normal.    Assessment and Plan Irisha was seen today for  transition of care-former english pt.  Diagnoses and all orders for this visit:  Hypothyroidism due to acquired atrophy of thyroid- last levels stable, stable symptoms -     TSH -     Comprehensive metabolic panel -     levothyroxine (SYNTHROID, LEVOTHROID) 88 MCG tablet; TAKE 1 TABLET EVERY DAY. Take on an empty stomach first thing in the morning. Not with any other meds  Mixed hyperlipidemia- stable last check, will monitor -     Lipid panel -     Comprehensive metabolic panel -     atorvastatin (LIPITOR) 80 MG tablet; TAKE 1 TABLET EVERY DAY  Prediabetes- reviewed A1c and pt did not meet criteria for diabetes  Updated medication records -     Hemoglobin A1c  Hemiparesis affecting left side as late effect of cerebrovascular accident ([place])- stable lipids, pt with limp on the left which her stroke affected  Closed comminuted intertrochanteric fracture of left femur, initial encounter ([place])- pt to establish care since her Orthopedic group is in [place] -     Ambulatory referral to [place]

## 2017-11-08 ENCOUNTER — Encounter: Payer: Self-pay | Admitting: Family Medicine

## 2017-11-08 ENCOUNTER — Ambulatory Visit (INDEPENDENT_AMBULATORY_CARE_PROVIDER_SITE_OTHER): Payer: Medicare HMO | Admitting: Family Medicine

## 2017-11-08 ENCOUNTER — Other Ambulatory Visit: Payer: Self-pay

## 2017-11-08 VITALS — BP 134/82 | HR 104 | Temp 98.7°F | Resp 17 | Ht 62.5 in | Wt 151.4 lb

## 2017-11-08 DIAGNOSIS — S72142A Displaced intertrochanteric fracture of left femur, initial encounter for closed fracture: Secondary | ICD-10-CM | POA: Diagnosis not present

## 2017-11-08 DIAGNOSIS — E782 Mixed hyperlipidemia: Secondary | ICD-10-CM | POA: Diagnosis not present

## 2017-11-08 DIAGNOSIS — I69354 Hemiplegia and hemiparesis following cerebral infarction affecting left non-dominant side: Secondary | ICD-10-CM

## 2017-11-08 DIAGNOSIS — R7303 Prediabetes: Secondary | ICD-10-CM

## 2017-11-08 DIAGNOSIS — E034 Atrophy of thyroid (acquired): Secondary | ICD-10-CM | POA: Diagnosis not present

## 2017-11-08 MED ORDER — LEVOTHYROXINE SODIUM 88 MCG PO TABS: ORAL_TABLET | ORAL | 3 refills | Status: DC

## 2017-11-08 MED ORDER — ATORVASTATIN CALCIUM 80 MG PO TABS: ORAL_TABLET | ORAL | 3 refills | Status: DC

## 2017-11-08 NOTE — Patient Instructions (Addendum)
I will contact you with your lab results within the next 2 weeks.  If you have not heard from [place] then please contact us. The fastest way to get your results is to register for My Chart.   IF you received an x-ray today, you will receive an invoice from [facility]. Please contact [facility] at [phone] with questions or concerns regarding your invoice.   IF you received labwork today, you will receive an invoice from [facility]. Please contact [facility] at [phone] with questions or concerns regarding your invoice.   Our billing staff will not be able to assist you with questions regarding bills from these companies.  You will be contacted with the lab results as soon as they are available. The fastest way to get your results is to activate your My Chart account. Instructions are located on the last page of this paperwork. If you have not heard from [place] regarding the results in 2 weeks, please contact this office.     Hypothyroidism Hypothyroidism is a disorder of the thyroid. The thyroid is a large gland that is located in the lower front of the neck. The thyroid releases hormones that control how the body works. With hypothyroidism, the thyroid does not make enough of these hormones. What are the causes? Causes of hypothyroidism may include:  Viral infections.  Pregnancy.  Your own defense system (immune system) attacking your thyroid.  Certain medicines.  Birth defects.  Past radiation treatments to your head or neck.  Past treatment with radioactive iodine.  Past surgical removal of part or all of your thyroid.  Problems with the gland that is located in the center of your brain (pituitary).  What are the signs or symptoms? Signs and symptoms of hypothyroidism may include:  Feeling as though you have no energy (lethargy).  Inability to tolerate cold.  Weight gain that is not explained by a change in diet or exercise habits.  Dry  skin.  Coarse hair.  Menstrual irregularity.  Slowing of thought processes.  Constipation.  Sadness or depression.  How is this diagnosed? Your health care provider may diagnose hypothyroidism with blood tests and ultrasound tests. How is this treated? Hypothyroidism is treated with medicine that replaces the hormones that your body does not make. After you begin treatment, it may take several weeks for symptoms to go away. Follow these instructions at home:  Take medicines only as directed by your health care provider.  If you start taking any new medicines, tell your health care provider.  Keep all follow-up visits as directed by your health care provider. This is important. As your condition improves, your dosage needs may change. You will need to have blood tests regularly so that your health care provider can watch your condition. Contact a health care provider if:  Your symptoms do not get better with treatment.  You are taking thyroid replacement medicine and: ? You sweat excessively. ? You have tremors. ? You feel anxious. ? You lose weight rapidly. ? You cannot tolerate heat. ? You have emotional swings. ? You have diarrhea. ? You feel weak. Get help right away if:  You develop chest pain.  You develop an irregular heartbeat.  You develop a rapid heartbeat. This information is not intended to replace advice given to you by your health care provider. Make sure you discuss any questions you have with your health care provider. Document Released: 03/14/2005 Document Revised: 08/20/2015 Document Reviewed: 07/30/2013 Elsevier Interactive Patient Education  2018 Elsevier Inc.  

## 2017-11-09 LAB — COMPREHENSIVE METABOLIC PANEL
ALT: 18 IU/L (ref 0–32)
AST: 26 IU/L (ref 0–40)
Albumin/Globulin Ratio: 1.4 (ref 1.2–2.2)
Albumin: 3.8 g/dL (ref 3.6–4.8)
Alkaline Phosphatase: 93 IU/L (ref 39–117)
BILIRUBIN TOTAL: 0.3 mg/dL (ref 0.0–1.2)
BUN/Creatinine Ratio: 22 (ref 12–28)
BUN: 19 mg/dL (ref 8–27)
CHLORIDE: 108 mmol/L — AB (ref 96–106)
CO2: 21 mmol/L (ref 20–29)
Calcium: 10.1 mg/dL (ref 8.7–10.3)
Creatinine, Ser: 0.85 mg/dL (ref 0.57–1.00)
GFR calc non Af Amer: 71 mL/min/{1.73_m2} (ref >59)
GFR, EST AFRICAN AMERICAN: 82 mL/min/{1.73_m2} (ref >59)
GLOBULIN, TOTAL: 2.7 g/dL (ref 1.5–4.5)
GLUCOSE: 85 mg/dL (ref 65–99)
Potassium: 4.4 mmol/L (ref 3.5–5.2)
SODIUM: 141 mmol/L (ref 134–144)
TOTAL PROTEIN: 6.5 g/dL (ref 6.0–8.5)

## 2017-11-09 LAB — HEMOGLOBIN A1C
Est. average glucose Bld gHb Est-mCnc: 117 mg/dL
Hgb A1c MFr Bld: 5.7 % — ABNORMAL HIGH (ref 4.8–5.6)

## 2017-11-09 LAB — LIPID PANEL
CHOLESTEROL TOTAL: 196 mg/dL (ref 100–199)
Chol/HDL Ratio: 3.7 ratio (ref 0.0–4.4)
HDL: 53 mg/dL (ref >39)
LDL CALC: 115 mg/dL — AB (ref 0–99)
TRIGLYCERIDES: 142 mg/dL (ref 0–149)
VLDL CHOLESTEROL CAL: 28 mg/dL (ref 5–40)

## 2017-11-09 LAB — TSH: TSH: 3.42 u[IU]/mL (ref 0.450–4.500)

## 2017-11-20 ENCOUNTER — Ambulatory Visit (INDEPENDENT_AMBULATORY_CARE_PROVIDER_SITE_OTHER): Payer: Medicare HMO | Admitting: Orthopaedic Surgery

## 2017-11-20 ENCOUNTER — Ambulatory Visit (INDEPENDENT_AMBULATORY_CARE_PROVIDER_SITE_OTHER): Payer: Medicare HMO

## 2017-11-20 DIAGNOSIS — M25552 Pain in left hip: Secondary | ICD-10-CM | POA: Diagnosis not present

## 2017-11-20 NOTE — Progress Notes (Signed)
Office Visit Note   Patient: Carla Hopkins           Date of Birth: 1950/07/09           MRN: [id number] Visit Date: 11/20/2017              Requested by: Forrest Moron, MD [place], [place] [address] PCP: Forrest Moron, MD   Assessment & Plan: Visit Diagnoses:  1. Pain of left hip joint     Plan: Films of the left femur reveal excellent position of the intramedullary nail and healing of the intertrochanteric fracture.  There are some degenerative changes in the left hip and left knee.  The arthritis might be the cause of her groin pain.  She seems to do very well with her exercises and the four-point cane.  No other suggestions.  No complications  of internal fixation device related to the fracture Follow-Up Instructions: No follow-ups on file.   Orders:  Orders Placed This Encounter  Procedures  . XR FEMUR MIN 2 VIEWS LEFT   No orders of the defined types were placed in this encounter.     Procedures: No procedures performed   Clinical Data: No additional findings.   Subjective: No chief complaint on file. Carla Hopkins is 67 years old and visited the office for follow-up evaluation of an intertrochanteric fracture of the left hip that was treated in [place] with open reduction and internal fixation.  Carla Hopkins was visiting her daughter around Christmas of last year when she slipped and fell sustaining a intertrochanteric fracture.  Treatment included an insertion of an intramedullary nail.  She notes that she still has some discomfort in the area of her left groin for which she takes Tylenol.  She also uses a four-point cane.  Her past history is significant that she sustained a right sided stroke 3 years ago with residual weakness of her left lower extremity.  She has more function with the left leg than she does with the left arm.  Carla Hopkins just wanted to be sure that there were no problems  HPI  Review of Systems   Objective: Vital Signs: There  were no vitals taken for this visit.  Physical Exam  Constitutional: She is oriented to person, place, and time. She appears well-developed and well-nourished.  HENT:  Mouth/Throat: Oropharynx is clear and moist.  Eyes: Pupils are equal, round, and reactive to light. EOM are normal.  Pulmonary/Chest: Effort normal.  Neurological: She is alert and oriented to person, place, and time.  Skin: Skin is warm and dry.  Psychiatric: She has a normal mood and affect. Her behavior is normal.    Ortho Exam awake alert and oriented x3.  Comfortable sitting.  Prior stroke involving the left upper and left lower extremity has somewhat of a spastic gait and does not use a lower extremity brace.  No localized tenderness about the left femur and specifically in the areas of the prior incisions.  Very mild pain with internal/external rotation of her left hip.  Increased reflexes left knee and ankle compared to right.  Limited use of left upper extremity and hand elated to her stroke.  Speech and thought process normal Specialty Comments:  No specialty comments available.  Imaging: No results found.   PMFS History: Patient Active Problem List   Diagnosis Date Noted  . Aortic stenosis 05/13/2017  . Orthostatic hypotension 05/13/2017  . History of CVA with residual deficit 12/30/2014  . Left  hemiparesis ([place]) 10/13/2014  . Left-sided neglect 10/13/2014  . History of right MCA stroke 08/06/2014  . Carotid stenosis 08/05/2014  . Acute right MCA stroke ([place]) 07/30/2014  . Cerebral infarction due to thrombosis of right middle cerebral artery ([place])   . Thyroid activity decreased   . Left-sided weakness   . Stroke ([place])   . Essential hypertension   . Carotid artery stenosis   . CVA (cerebral infarction) 07/24/2014  . Hyperlipidemia 07/24/2014  . Elevated troponin 07/24/2014  . Tobacco abuse 07/24/2014  . Benign essential HTN 07/23/2014  . Obstructive sleep apnea 07/23/2014  . Hypothyroidism  07/03/2014  . BMI 38.0-38.9,adult 05/19/2013  . Nicotine addiction 05/19/2013  . Right atrial enlargement 05/19/2013   Past Medical History:  Diagnosis Date  . Hypertension   . Hypothyroidism   . Stroke [facility])     Family History  Adopted: Yes  Problem Relation Age of Onset  . Colon cancer Neg Hx     Past Surgical History:  Procedure Laterality Date  . ENDARTERECTOMY Right 08/05/2014   Procedure: Right Carotid ENDARTERECTOMY ;  Surgeon: Elam Dutch, MD;  Location: [place];  Service: Vascular;  Laterality: Right;  . LEG SURGERY    . PATCH ANGIOPLASTY Right 08/05/2014   Procedure: PATCH ANGIOPLASTY;  Surgeon: Elam Dutch, MD;  Location: [place];  Service: Vascular;  Laterality: Right;  . TUBAL LIGATION     Social History   Occupational History  . Not on file  Tobacco Use  . Smoking status: Former Smoker    Packs/day: 20.00    Types: Cigarettes  . Smokeless tobacco: Never Used  . Tobacco comment: quit smoking July 24, 2014  Substance and Sexual Activity  . Alcohol use: Yes    Alcohol/week: 0.0 standard drinks    Comment: once yearly  . Drug use: No  . Sexual activity: Not Currently     Garald Balding, MD   Note - This record has been created using [employer].  Chart creation errors have been sought, but may not always  have been located. Such creation errors do not reflect on  the standard of medical care.

## 2017-12-08 ENCOUNTER — Telehealth: Payer: Self-pay | Admitting: Family Medicine

## 2017-12-08 NOTE — Telephone Encounter (Signed)
Copied from [place] [id number]. Topic: General - Other >> Dec 08, 2017  2:28 PM Lennox Solders wrote: Reason for CRM:pt handicap placard will expired by end to this month. Pt would like another one complete. Please call patient once form has been completed

## 2017-12-12 DIAGNOSIS — I422 Other hypertrophic cardiomyopathy: Secondary | ICD-10-CM | POA: Diagnosis not present

## 2017-12-12 DIAGNOSIS — E7800 Pure hypercholesterolemia, unspecified: Secondary | ICD-10-CM | POA: Diagnosis not present

## 2017-12-12 DIAGNOSIS — Z8673 Personal history of transient ischemic attack (TIA), and cerebral infarction without residual deficits: Secondary | ICD-10-CM | POA: Diagnosis not present

## 2017-12-12 DIAGNOSIS — I779 Disorder of arteries and arterioles, unspecified: Secondary | ICD-10-CM | POA: Diagnosis not present

## 2017-12-14 ENCOUNTER — Telehealth: Payer: Self-pay | Admitting: Family Medicine

## 2017-12-14 NOTE — Telephone Encounter (Signed)
Done and given to [place]

## 2017-12-14 NOTE — Telephone Encounter (Signed)
Done and given to front desk to notify patient to pick up

## 2017-12-14 NOTE — Telephone Encounter (Signed)
Left a VM to let the patient know that her Disability Parking Placard form was ready for pick up. It can be picked up in the [address].

## 2017-12-26 NOTE — Telephone Encounter (Signed)
Patient called to say that Handicapped Placard form that was completed by Dr Delia Chimes had a minor error at the bottom of the form, per patient two was circled but one was to be circled. Patient regrets that form was rejected and need a new one completed please. Request a call back when done can leave VM if no answer. Ph# [phone]

## 2017-12-27 NOTE — Telephone Encounter (Signed)
Spoke to pt.  Clarified MD needs to circle  '5 year placard WITHOUT re-certification upon renewal'. Completed and signed.  Called pt back & adivsed it is at [address] front desk for pick up.   Copy to scan for Epic chart.

## 2018-01-16 ENCOUNTER — Other Ambulatory Visit: Payer: Self-pay | Admitting: *Deleted

## 2018-01-16 ENCOUNTER — Telehealth: Payer: Self-pay | Admitting: Family Medicine

## 2018-01-16 DIAGNOSIS — E2839 Other primary ovarian failure: Secondary | ICD-10-CM

## 2018-01-16 NOTE — Telephone Encounter (Signed)
Copied from [place] [phone] [id number]. Topic: Quick Communication - See Telephone Encounter >> Jan 16, 2018 11:05 AM Antonieta Iba C wrote: CRM for notification. See Telephone encounter for: 01/16/18.  Deb w/ [employer] is calling in to follow up on fax sent in regards to pt's bone density. Pt recently had a bone fracture and her insurance would like to know if she has had a recent bone density? IF not, they would like for pt to have one.  CB: [phone]  FaX: [phone]

## 2018-05-09 ENCOUNTER — Other Ambulatory Visit: Payer: Self-pay | Admitting: Cardiology

## 2018-05-09 DIAGNOSIS — I422 Other hypertrophic cardiomyopathy: Secondary | ICD-10-CM

## 2018-05-14 ENCOUNTER — Encounter: Payer: Self-pay | Admitting: Family Medicine

## 2018-05-14 ENCOUNTER — Other Ambulatory Visit: Payer: Self-pay

## 2018-05-14 ENCOUNTER — Ambulatory Visit (INDEPENDENT_AMBULATORY_CARE_PROVIDER_SITE_OTHER): Payer: Medicare HMO | Admitting: Family Medicine

## 2018-05-14 VITALS — BP 132/80 | HR 86 | Temp 98.5°F | Resp 17 | Ht 62.5 in | Wt 168.8 lb

## 2018-05-14 DIAGNOSIS — E119 Type 2 diabetes mellitus without complications: Secondary | ICD-10-CM

## 2018-05-14 DIAGNOSIS — I10 Essential (primary) hypertension: Secondary | ICD-10-CM | POA: Diagnosis not present

## 2018-05-14 DIAGNOSIS — M25552 Pain in left hip: Secondary | ICD-10-CM | POA: Diagnosis not present

## 2018-05-14 DIAGNOSIS — Z1239 Encounter for other screening for malignant neoplasm of breast: Secondary | ICD-10-CM

## 2018-05-14 DIAGNOSIS — E034 Atrophy of thyroid (acquired): Secondary | ICD-10-CM

## 2018-05-14 DIAGNOSIS — E559 Vitamin D deficiency, unspecified: Secondary | ICD-10-CM

## 2018-05-14 MED ORDER — DICLOFENAC SODIUM 1 % TD GEL: 2.0000 g | Freq: Four times a day (QID) | TRANSDERMAL | 3 refills | Status: DC

## 2018-05-14 NOTE — Progress Notes (Signed)
Established Patient Office Visit  Subjective:  Patient ID: Carla Hopkins, female    DOB: 02/26/1951  Age: 68 y.o. MRN: [id number]  CC:  Chief Complaint  Patient presents with  . Hyperlipidemia    6 month recheck    HPI Carla Hopkins presents for  Essential Hypertension She was having issues with orthostatic hypotension and her lisinopril was discontinued She sees her Cardiologist She reports that since being off the lisinopril 27m she has felt better She is avoiding salty foods Does not take NSAIDs  BP Readings from Last 3 Encounters:  05/14/18 132/80  11/08/17 134/82  09/08/17 130/86   History of Stroke Arthritis She reports that she sees her Cardiologist in March 2020 She reports that she does not get to exercise She reports limitations with exercise is due to walking She reports a history of concern for arthritis in the left hip area where her rod is in the left hip and femur She denies shortness of breath or dizziness She is compliant with her statin and takes asa 3232m Hypothyroidism- acquired She is taking levothyroxine 8820mShe reports that she has not missed any doses Denies current fatigue or weakness No heart palpitations  Lab Results  Component Value Date   TSH 3.420 11/08/2017   Diabetes Mellitus: Patient presents for follow up of diabetes.  Symptoms have stabilized. Patient denies foot ulcerations, hyperglycemia and hypoglycemia .  Evaluation to date has been included: hemoglobin A1C.  Home sugars: patient does not check sugars. Treatment to date: diet controlled Lab Results  Component Value Date   HGBA1C 5.7 (H) 11/08/2017    Past Medical History:  Diagnosis Date  . Hypertension   . Hypothyroidism   [facility]   Past Surgical History:  Procedure Laterality Date  . ENDARTERECTOMY Right 08/05/2014   Procedure: Right Carotid ENDARTERECTOMY ;  Surgeon: ChaElam DutchD;  [facility]  Laterality: Right;  .  LEG SURGERY    . PATCH ANGIOPLASTY Right 08/05/2014   Procedure: PATCH ANGIOPLASTY;  Surgeon: ChaElam DutchD;  [facility];  Laterality: Right;  . TUBAL LIGATION      Family History  Adopted: Yes  Problem Relation Age of Onset  . Colon cancer Neg Hx     Social History   Socioeconomic History  . Marital status: Single    Spouse name: Not on file  . Number of children: Not on file  . Years of education: Not on file  . Highest education level: Not on file  Occupational History  . Not on file  Social Needs  . Financial resource strain: Not on file  . Food insecurity:    Worry: Not on file    Inability: Not on file  . Transportation needs:    Medical: Not on file    Non-medical: Not on file  Tobacco Use  . Smoking status: Former Smoker    Packs/day: 20.00    Types: Cigarettes  . Smokeless tobacco: Never Used  . Tobacco comment: quit smoking July 24, 2014  Substance and Sexual Activity  . Alcohol use: Yes    Alcohol/week: 0.0 standard drinks    Comment: once yearly  . Drug use: No  . Sexual activity: Not Currently  Lifestyle  . Physical activity:    Days per week: Not on file    Minutes per session: Not on file  . Stress: Not on file  Relationships  . Social  connections:    Talks on phone: Not on file    Gets together: Not on file    Attends religious service: Not on file    Active member of club or organization: Not on file    Attends meetings of clubs or organizations: Not on file    Relationship status: Not on file  . Intimate partner violence:    Fear of current or ex partner: Not on file    Emotionally abused: Not on file    Physically abused: Not on file    Forced sexual activity: Not on file  Other Topics Concern  . Not on file  Social History Narrative  . Not on file    Outpatient Medications Prior to Visit  Medication Sig Dispense Refill  . aspirin 81 MG chewable tablet Chew 1 tablet (81 mg total) by mouth daily.      Marland Kitchen atorvastatin (LIPITOR) 80 MG tablet TAKE 1 TABLET EVERY DAY 90 tablet 3  . levothyroxine (SYNTHROID, LEVOTHROID) 88 MCG tablet TAKE 1 TABLET EVERY DAY. Take on an empty stomach first thing in the morning. Not with any other meds 90 tablet 3  . Multiple Vitamin (MULTIVITAMIN) tablet Take 1 tablet by mouth daily.     Facility-Administered Medications Prior to Visit  Medication Dose Route Frequency Provider Last Rate Last Dose  . 0.9 %  sodium chloride infusion  500 mL Intravenous Continuous Danis, Estill Cotta III, MD        No Known Allergies  ROS Review of Systems    Objective:    Physical Exam  BP 132/80   Pulse 86   Temp 98.5 F (36.9 C) (Oral)   Resp 17   Ht 5' 2.5" (1.588 m)   Wt 168 lb 12.8 oz (76.6 kg)   SpO2 95%   BMI 30.38 kg/m  Wt Readings from Last 3 Encounters:  05/14/18 168 lb 12.8 oz (76.6 kg)  11/08/17 151 lb 6.4 oz (68.7 kg)  09/08/17 152 lb (68.9 kg)   Diabetic Foot Exam - Simple   Simple Foot Form Diabetic Foot exam was performed with the following findings:  Yes 05/14/2018  2:28 PM  Visual Inspection No deformities, no ulcerations, no other skin breakdown bilaterally:  Yes Sensation Testing See comments:  Yes Pulse Check Posterior Tibialis and Dorsalis pulse intact bilaterally:  Yes Comments Decreased sensation on the left following stroke     Physical Exam  Constitutional: Oriented to person, place, and time. Appears well-developed and well-nourished.  HENT:  Head: Normocephalic and atraumatic.  Eyes: Conjunctivae and EOM are normal.  Cardiovascular: Normal rate, regular rhythm, normal heart sounds and intact distal pulses.  No murmur heard. Pulmonary/Chest: Effort normal and breath sounds normal. No stridor. No respiratory distress. Has no wheezes.  Neurological: Is alert and oriented to person, place, and time. Left arm with weakness 4.5/5, left leg with limp Skin: Skin is warm. Capillary refill takes less than 2 seconds.  Psychiatric: Has  a normal mood and affect. Behavior is normal. Judgment and thought content normal.    Health Maintenance Due  Topic Date Due  . Hepatitis C Screening  [birth year]  . TETANUS/TDAP  12/29/1968  . DEXA SCAN  12/30/2014  . URINE MICROALBUMIN  10/28/2015  . OPHTHALMOLOGY EXAM  12/03/2015  . FOOT EXAM  07/13/2017  . HEMOGLOBIN A1C  05/11/2018    There are no preventive care reminders to display for this patient.  Lab Results  Component Value Date   TSH 3.420 11/08/2017  Lab Results  Component Value Date   WBC 10.9 (H) 04/21/2017   HGB 11.4 04/21/2017   HCT 35.8 04/21/2017   MCV 91 04/21/2017   PLT 316 04/21/2017   Lab Results  Component Value Date   NA 141 11/08/2017   K 4.4 11/08/2017   CO2 21 11/08/2017   GLUCOSE 85 11/08/2017   BUN 19 11/08/2017   CREATININE 0.85 11/08/2017   BILITOT 0.3 11/08/2017   ALKPHOS 93 11/08/2017   AST 26 11/08/2017   ALT 18 11/08/2017   PROT 6.5 11/08/2017   ALBUMIN 3.8 11/08/2017   CALCIUM 10.1 11/08/2017   ANIONGAP 8 08/07/2014   Lab Results  Component Value Date   CHOL 196 11/08/2017   Lab Results  Component Value Date   HDL 53 11/08/2017   Lab Results  Component Value Date   LDLCALC 115 (H) 11/08/2017   Lab Results  Component Value Date   TRIG 142 11/08/2017   Lab Results  Component Value Date   CHOLHDL 3.7 11/08/2017   Lab Results  Component Value Date   HGBA1C 5.7 (H) 11/08/2017      Assessment & Plan:   Problem List Items Addressed This Visit      Cardiovascular and Mediastinum   Benign essential HTN patient takes an occasional ibuprofen and has been avoiding salty foods.  She plans to get back on a diet and increase her exercise.  We will check her levels for her kidney function.  She is to avoid ibuprofen.   Relevant Orders   Microalbumin, urine   CMP14+EGFR     Endocrine   Hypothyroidism   -   we will check patient's thyroid level today she is compliant with her levothyroxine 88 mcg   Relevant Orders    TSH    Other Visit Diagnoses    Diet-controlled diabetes mellitus ([place])    -  Primary Patient has a history of diabetes that has been well controlled in the past few years and she does not take medications for this Reviewed the chart and saw that back in 2016 she had an A1c of 7.9% which meets the diagnosis of diabetes We will get a urine sample today and help patient to set up for an eye exam with ophthalmologist    Relevant Orders   Microalbumin, urine   Ambulatory referral to Ophthalmology   HM Diabetes Foot Exam (Completed)   Vitamin D deficiency    -  History of vitamin D deficiency will check   Relevant Orders   HCV Ab w Reflex to Quant PCR   VITAMIN D 25 Hydroxy (Vit-D Deficiency, Fractures)   Screening for breast cancer       Relevant Orders   MM Digital Screening   Left hip pain    -this is a 68 year old female with left side weakness after a right MCA stroke She now has pain in the hip orthopedics recommended evaluation for arthritis Due to her limitations following her stroke she should not take high doses of NSAIDs we will give her Voltaren gel We will refer to physical therapy for help with pain management and education on upper body exercises as well as ways to reduce stiffness in the lower extremity joint  Advised patient to stop ibuprofen and start Tylenol arthritis with the Voltaren gel   Relevant Orders   Ambulatory referral to Physical Therapy      Meds ordered this encounter  Medications  . diclofenac sodium (VOLTAREN) 1 % GEL  Sig: Apply 2 g topically 4 (four) times daily.    Dispense:  100 g    Refill:  3    Follow-up: No follow-ups on file.    Forrest Moron, MD

## 2018-05-14 NOTE — Patient Instructions (Addendum)
For arthritis:  voltaren gel apply to the painful hip twice a day for arthritis Follow up with Physical Therapy at [facility]   We recommend that you schedule a mammogram for breast cancer screening. Typically, you do not need a referral to do this. Please contact a local imaging center to schedule your mammogram.  [facility] - [phone]  *ask for the Radiology [place] ([place]) - [phone] or [phone]  [facility] - [phone] [place] [phone] [facility] - [phone]  *ask for the [facility] - [phone]  *ask for the Radiology Department [facility] - [phone]  *ask for the [place] - [phone]     If you have lab work done today you will be contacted with your lab results within the next 2 weeks.  If you have not heard from [place] then please contact us. The fastest way to get your results is to register for My Chart.   IF you received an x-ray today, you will receive an invoice from [facility]. Please contact [facility] at [phone] with questions or concerns regarding your invoice.   IF you received labwork today, you will receive an invoice from [place]. Please contact [facility] at [phone] with questions or concerns regarding your invoice.   Our billing staff will not be able to assist you with questions regarding bills from these companies.  You will be contacted with the lab results as soon as they are available. The fastest way to get your results is to activate your My Chart account. Instructions are located on the last page of this paperwork. If you have not heard from [place] regarding the results in 2 weeks, please contact this office.

## 2018-05-15 ENCOUNTER — Telehealth: Payer: Self-pay | Admitting: Family Medicine

## 2018-05-15 LAB — MICROALBUMIN, URINE: Microalbumin, Urine: 17.4 ug/mL

## 2018-05-15 NOTE — Telephone Encounter (Signed)
Copied from [place] [id number]. Topic: Referral - Question >> May 15, 2018  9:26 AM Adelene Idler wrote: [facility] is calling in stating they need a script for patient to receive PT  Fax# [phone]

## 2018-05-21 LAB — CMP14+EGFR
ALK PHOS: 131 IU/L — AB (ref 39–117)
ALT: 24 IU/L (ref 0–32)
AST: 31 IU/L (ref 0–40)
Albumin/Globulin Ratio: 1.4 (ref 1.2–2.2)
Albumin: 4 g/dL (ref 3.8–4.8)
BILIRUBIN TOTAL: 0.2 mg/dL (ref 0.0–1.2)
BUN / CREAT RATIO: 15 (ref 12–28)
BUN: 16 mg/dL (ref 8–27)
CHLORIDE: 112 mmol/L — AB (ref 96–106)
CO2: 16 mmol/L — ABNORMAL LOW (ref 20–29)
Calcium: 9.6 mg/dL (ref 8.7–10.3)
Creatinine, Ser: 1.05 mg/dL — ABNORMAL HIGH (ref 0.57–1.00)
GFR calc Af Amer: 63 mL/min/{1.73_m2} (ref >59)
GFR calc non Af Amer: 55 mL/min/{1.73_m2} — ABNORMAL LOW (ref >59)
GLUCOSE: 98 mg/dL (ref 65–99)
Globulin, Total: 2.8 g/dL (ref 1.5–4.5)
Potassium: 4.6 mmol/L (ref 3.5–5.2)
Sodium: 144 mmol/L (ref 134–144)
Total Protein: 6.8 g/dL (ref 6.0–8.5)

## 2018-05-21 LAB — HCV INTERPRETATION

## 2018-05-21 LAB — VITAMIN D 25 HYDROXY (VIT D DEFICIENCY, FRACTURES): VIT D 25 HYDROXY: 36.7 ng/mL (ref 30.0–100.0)

## 2018-05-21 LAB — TSH: TSH: 2.3 u[IU]/mL (ref 0.450–4.500)

## 2018-05-21 LAB — HCV AB W REFLEX TO QUANT PCR: HCV Ab: <0.1 s/co ratio (ref 0.0–0.9)

## 2018-05-21 NOTE — Telephone Encounter (Signed)
Unable to give verbal for PT due to no call back number. Please write PT rx. thanks

## 2018-05-22 DIAGNOSIS — M25652 Stiffness of left hip, not elsewhere classified: Secondary | ICD-10-CM | POA: Diagnosis not present

## 2018-05-22 DIAGNOSIS — M25552 Pain in left hip: Secondary | ICD-10-CM | POA: Diagnosis not present

## 2018-05-22 DIAGNOSIS — R262 Difficulty in walking, not elsewhere classified: Secondary | ICD-10-CM | POA: Diagnosis not present

## 2018-05-22 DIAGNOSIS — R531 Weakness: Secondary | ICD-10-CM | POA: Diagnosis not present

## 2018-05-24 DIAGNOSIS — R262 Difficulty in walking, not elsewhere classified: Secondary | ICD-10-CM | POA: Diagnosis not present

## 2018-05-24 DIAGNOSIS — M25652 Stiffness of left hip, not elsewhere classified: Secondary | ICD-10-CM | POA: Diagnosis not present

## 2018-05-24 DIAGNOSIS — M25552 Pain in left hip: Secondary | ICD-10-CM | POA: Diagnosis not present

## 2018-05-24 DIAGNOSIS — R531 Weakness: Secondary | ICD-10-CM | POA: Diagnosis not present

## 2018-05-28 DIAGNOSIS — M25652 Stiffness of left hip, not elsewhere classified: Secondary | ICD-10-CM | POA: Diagnosis not present

## 2018-05-28 DIAGNOSIS — M25552 Pain in left hip: Secondary | ICD-10-CM | POA: Diagnosis not present

## 2018-05-28 DIAGNOSIS — R262 Difficulty in walking, not elsewhere classified: Secondary | ICD-10-CM | POA: Diagnosis not present

## 2018-05-28 DIAGNOSIS — R531 Weakness: Secondary | ICD-10-CM | POA: Diagnosis not present

## 2018-05-30 DIAGNOSIS — R262 Difficulty in walking, not elsewhere classified: Secondary | ICD-10-CM | POA: Diagnosis not present

## 2018-05-30 DIAGNOSIS — R531 Weakness: Secondary | ICD-10-CM | POA: Diagnosis not present

## 2018-05-30 DIAGNOSIS — M25552 Pain in left hip: Secondary | ICD-10-CM | POA: Diagnosis not present

## 2018-05-30 DIAGNOSIS — M25652 Stiffness of left hip, not elsewhere classified: Secondary | ICD-10-CM | POA: Diagnosis not present

## 2018-06-01 DIAGNOSIS — M25652 Stiffness of left hip, not elsewhere classified: Secondary | ICD-10-CM | POA: Diagnosis not present

## 2018-06-01 DIAGNOSIS — M25552 Pain in left hip: Secondary | ICD-10-CM | POA: Diagnosis not present

## 2018-06-01 DIAGNOSIS — R262 Difficulty in walking, not elsewhere classified: Secondary | ICD-10-CM | POA: Diagnosis not present

## 2018-06-01 DIAGNOSIS — R531 Weakness: Secondary | ICD-10-CM | POA: Diagnosis not present

## 2018-06-04 DIAGNOSIS — M25552 Pain in left hip: Secondary | ICD-10-CM | POA: Diagnosis not present

## 2018-06-04 DIAGNOSIS — R262 Difficulty in walking, not elsewhere classified: Secondary | ICD-10-CM | POA: Diagnosis not present

## 2018-06-04 DIAGNOSIS — M25652 Stiffness of left hip, not elsewhere classified: Secondary | ICD-10-CM | POA: Diagnosis not present

## 2018-06-04 DIAGNOSIS — R531 Weakness: Secondary | ICD-10-CM | POA: Diagnosis not present

## 2018-06-05 ENCOUNTER — Ambulatory Visit: Payer: Medicare HMO

## 2018-06-05 DIAGNOSIS — I422 Other hypertrophic cardiomyopathy: Secondary | ICD-10-CM | POA: Diagnosis not present

## 2018-06-06 DIAGNOSIS — R262 Difficulty in walking, not elsewhere classified: Secondary | ICD-10-CM | POA: Diagnosis not present

## 2018-06-06 DIAGNOSIS — M25552 Pain in left hip: Secondary | ICD-10-CM | POA: Diagnosis not present

## 2018-06-06 DIAGNOSIS — R531 Weakness: Secondary | ICD-10-CM | POA: Diagnosis not present

## 2018-06-06 DIAGNOSIS — M25652 Stiffness of left hip, not elsewhere classified: Secondary | ICD-10-CM | POA: Diagnosis not present

## 2018-06-10 NOTE — Progress Notes (Deleted)
Subjective:  Primary Physician:  Forrest Moron, MD  Patient ID: Carla Hopkins, female    DOB: 24-Aug-1950, 68 y.o.   MRN: [id number]  No chief complaint on file.   HPI: Carla Hopkins  is a 68 y.o. female.  In December 2018, patient fell down and had fractured left femur. She was in [place] at that time, visiting her daughter. Patient had surgery for the fracture. While in the hospital, she was found to have significant orthostatic hypotension. Patient denies any symptoms of dizziness, near-syncope or syncope. She did not have any dizziness or syncope during the fall also. Patient says that she just slipped and fell down. She denies any dizziness presently or at any other time.  No complaints of chest pain, shortness of breath, orthopnea or PND. No palpitation. No swelling on the legs and no claudication. Her walking is very limited due to previous stroke. Patient had stroke in 2016, she underwent right-sided carotid endarterectomy in May 2016. Patient still has significant residual left hemiparesis, ambulates with help of quad cane. She uses walker if she has to carry anything.  She was told to have borderline elevated blood pressure but also had significant orthostatic blood pressure changes. Her blood pressure has been normal for past several months without any therapy. She does not have diabetes. Patient has hypercholesterolemia. She is former smoker, quit smoking in 2016.  Patient has hypothyroidism. ***  Past Medical History:  Diagnosis Date  . Hypertension   . Hypothyroidism   . Stroke [facility])     Past Surgical History:  Procedure Laterality Date  . ENDARTERECTOMY Right 08/05/2014   Procedure: Right Carotid ENDARTERECTOMY ;  Surgeon: Elam Dutch, MD;  Location: [place];  Service: Vascular;  Laterality: Right;  . LEG SURGERY    . PATCH ANGIOPLASTY Right 08/05/2014   Procedure: PATCH ANGIOPLASTY;  Surgeon: Elam Dutch, MD;  Location: [place];  Service:  Vascular;  Laterality: Right;  . TUBAL LIGATION      Social History   Socioeconomic History  . Marital status: Single    Spouse name: Not on file  . Number of children: Not on file  . Years of education: Not on file  . Highest education level: Not on file  Occupational History  . Not on file  Social Needs  . Financial resource strain: Not on file  . Food insecurity:    Worry: Not on file    Inability: Not on file  . Transportation needs:    Medical: Not on file    Non-medical: Not on file  Tobacco Use  . Smoking status: Former Smoker    Packs/day: 20.00    Types: Cigarettes  . Smokeless tobacco: Never Used  . Tobacco comment: quit smoking July 24, 2014  Substance and Sexual Activity  . Alcohol use: Yes    Alcohol/week: 0.0 standard drinks    Comment: once yearly  . Drug use: No  . Sexual activity: Not Currently  Lifestyle  . Physical activity:    Days per week: Not on file    Minutes per session: Not on file  . Stress: Not on file  Relationships  . Social connections:    Talks on phone: Not on file    Gets together: Not on file    Attends religious service: Not on file    Active member of club or organization: Not on file    Attends meetings of clubs or organizations: Not on file    Relationship  status: Not on file  . Intimate partner violence:    Fear of current or ex partner: Not on file    Emotionally abused: Not on file    Physically abused: Not on file    Forced sexual activity: Not on file  Other Topics Concern  . Not on file  Social History Narrative  . Not on file    Current Outpatient Medications on File Prior to Visit  Medication Sig Dispense Refill  . aspirin 81 MG chewable tablet Chew 1 tablet (81 mg total) by mouth daily.    Marland Kitchen atorvastatin (LIPITOR) 80 MG tablet TAKE 1 TABLET EVERY DAY 90 tablet 3  . diclofenac sodium (VOLTAREN) 1 % GEL Apply 2 g topically 4 (four) times daily. 100 g 3  . levothyroxine (SYNTHROID, LEVOTHROID) 88 MCG tablet  TAKE 1 TABLET EVERY DAY. Take on an empty stomach first thing in the morning. Not with any other meds 90 tablet 3  . Multiple Vitamin (MULTIVITAMIN) tablet Take 1 tablet by mouth daily.     Current Facility-Administered Medications on File Prior to Visit  Medication Dose Route Frequency Provider Last Rate Last Dose  . 0.9 %  sodium chloride infusion  500 mL Intravenous Continuous Danis, Kirke Corin, MD        ***Review of Systems  Constitutional: Negative for fever.  HENT: Negative for nosebleeds.   Eyes: Negative for blurred vision.  Gastrointestinal: Negative for abdominal pain, nausea and vomiting.  Genitourinary: Negative for dysuria.  Musculoskeletal: Negative for myalgias.  Skin: Negative for itching and rash.  Neurological: Negative for dizziness and loss of consciousness.  Endo/Heme/Allergies: Does not bruise/bleed easily.  Psychiatric/Behavioral: The patient is not nervous/anxious.   All other systems reviewed and are negative.      Objective:  There were no vitals taken for this visit. There is no height or weight on file to calculate BMI.  ***Physical Exam  Constitutional: She is oriented to person, place, and time. She appears well-developed and well-nourished. No distress.  HENT:  Head: Normocephalic and atraumatic.  Eyes: Pupils are equal, round, and reactive to light. Conjunctivae are normal.  Neck: No JVD present.  Cardiovascular: Normal rate, regular rhythm and intact distal pulses.  Murmur ( Gr.3/6 ESM in  aortic area,conducted to right neck & all over precordium) heard. Pulmonary/Chest: Effort normal and breath sounds normal. She has no wheezes. She has no rales.  Abdominal: Soft. Bowel sounds are normal. There is no rebound.  Musculoskeletal:        General: No edema.  Lymphadenopathy:    She has no cervical adenopathy.  Neurological: She is alert and oriented to person, place, and time. No cranial nerve deficit.  Skin: Skin is warm and dry.  Psychiatric:  She has a normal mood and affect.  Nursing note and vitals reviewed.   CARDIAC STUDIES:  Echocardiogram-03/19/2017-done in [place]. Severe concentric LVH, hyperdynamic LV with EF greater than 83%, grade 1 diastolic dysfunction. Severe left atrial enlargement Systolic anterior motion of mitral valve, mild mitral regurgitation Moderate aortic sclerosis but no evidence of aortic stenosis Mild LVOT obstruction with peak LVOT velocity 2.8 cm (gradient of 31 mmHg).  Carotid duplex at [facility] on 08/29/2016- patent right carotid endarterectomy. Less than 40% left internal carotid artery stenosis.  *** Assessment & Recommendations:   There are no diagnoses linked to this encounter.  Recommendation:  Patient remains stable symptomatically. Her blood pressure is normal and she does not have any orthostatic blood pressure changes. There  is no dyspnea and no signs of CHF.  Primary prevention was again discussed with her. She was advised to follow low cholesterol diet.   She will return for follow-up after 6 months. She will continue to have all the blood tests at her PCP's office. She has been scheduled for echocardiogram before the next visit for follow-up of hypertrophic cardiomyopathy. ***  Despina Hick, MD, [facility] 06/10/2018, 8:14 PM [facility]. [place] Pager: [phone] Office: [phone] If no answer Cell [phone]

## 2018-06-11 ENCOUNTER — Telehealth: Payer: Self-pay

## 2018-06-11 NOTE — Telephone Encounter (Signed)
Pt  Called and wanted to know if she could have results over the phone instead of coming into office tomorrow. She will reschedule for a few weeks, please advise?

## 2018-06-12 ENCOUNTER — Ambulatory Visit: Payer: Self-pay | Admitting: Cardiology

## 2018-06-12 DIAGNOSIS — Z5329 Procedure and treatment not carried out because of patient's decision for other reasons: Secondary | ICD-10-CM

## 2018-06-26 ENCOUNTER — Ambulatory Visit (INDEPENDENT_AMBULATORY_CARE_PROVIDER_SITE_OTHER): Payer: Medicare HMO | Admitting: Family Medicine

## 2018-06-26 ENCOUNTER — Other Ambulatory Visit: Payer: Self-pay

## 2018-06-26 VITALS — Ht 62.5 in | Wt 154.0 lb

## 2018-06-26 DIAGNOSIS — Z0000 Encounter for general adult medical examination without abnormal findings: Secondary | ICD-10-CM | POA: Diagnosis not present

## 2018-06-26 NOTE — Patient Instructions (Signed)
Thank you for taking time to come for your Medicare Wellness Visit. I appreciate your ongoing commitment to your health goals. Please review the following plan we discussed and let me know if I can assist you in the future.    LPN  Healthy Eating Following a healthy eating pattern may help you to achieve and maintain a healthy body weight, reduce the risk of chronic disease, and live a long and productive life. It is important to follow a healthy eating pattern at an appropriate calorie level for your body. Your nutritional needs should be met primarily through food by choosing a variety of nutrient-rich foods. What are tips for following this plan? Reading food labels  Read labels and choose the following: ? Reduced or low sodium. ? Juices with 100% fruit juice. ? Foods with low saturated fats and high polyunsaturated and monounsaturated fats. ? Foods with whole grains, such as whole wheat, cracked wheat, brown rice, and wild rice. ? Whole grains that are fortified with folic acid. This is recommended for women who are pregnant or who want to become pregnant.  Read labels and avoid the following: ? Foods with a lot of added sugars. These include foods that contain brown sugar, corn sweetener, corn syrup, dextrose, fructose, glucose, high-fructose corn syrup, honey, invert sugar, lactose, malt syrup, maltose, molasses, raw sugar, sucrose, trehalose, or turbinado sugar.  Do not eat more than the following amounts of added sugar per day:  6 teaspoons (25 g) for women.  9 teaspoons (38 g) for men. ? Foods that contain processed or refined starches and grains. ? Refined grain products, such as white flour, degermed cornmeal, white bread, and white rice. Shopping  Choose nutrient-rich snacks, such as vegetables, whole fruits, and nuts. Avoid high-calorie and high-sugar snacks, such as potato chips, fruit snacks, and candy.  Use oil-based dressings and spreads on foods instead of  solid fats such as butter, stick margarine, or cream cheese.  Limit pre-made sauces, mixes, and "instant" products such as flavored rice, instant noodles, and ready-made pasta.  Try more plant-protein sources, such as tofu, tempeh, black beans, edamame, lentils, nuts, and seeds.  Explore eating plans such as the Mediterranean diet or vegetarian diet. Cooking  Use oil to saut or stir-fry foods instead of solid fats such as butter, stick margarine, or lard.  Try baking, boiling, grilling, or broiling instead of frying.  Remove the fatty part of meats before cooking.  Steam vegetables in water or broth. Meal planning   At meals, imagine dividing your plate into fourths: ? One-half of your plate is fruits and vegetables. ? One-fourth of your plate is whole grains. ? One-fourth of your plate is protein, especially lean meats, poultry, eggs, tofu, beans, or nuts.  Include low-fat dairy as part of your daily diet. Lifestyle  Choose healthy options in all settings, including home, work, school, restaurants, or stores.  Prepare your food safely: ? Wash your hands after handling raw meats. ? Keep food preparation surfaces clean by regularly washing with hot, soapy water. ? Keep raw meats separate from ready-to-eat foods, such as fruits and vegetables. ? Cook seafood, meat, poultry, and eggs to the recommended internal temperature. ? Store foods at safe temperatures. In general:  Keep cold foods at 40F (4.4C) or below.  Keep hot foods at 140F (60C) or above.  Keep your freezer at 0F (-17.8C) or below.  Foods are no longer safe to eat when they have been between the temperatures of 40-140F (4.4-60C) for   more than 2 hours. What foods should I eat? Fruits Aim to eat 2 cup-equivalents of fresh, canned (in natural juice), or frozen fruits each day. Examples of 1 cup-equivalent of fruit include 1 small Screws, 8 large strawberries, 1 cup canned fruit,  cup dried fruit, or 1 cup  100% juice. Vegetables Aim to eat 2-3 cup-equivalents of fresh and frozen vegetables each day, including different varieties and colors. Examples of 1 cup-equivalent of vegetables include 2 medium carrots, 2 cups raw, leafy greens, 1 cup chopped vegetable (raw or cooked), or 1 medium baked potato. Grains Aim to eat 6 ounce-equivalents of whole grains each day. Examples of 1 ounce-equivalent of grains include 1 slice of bread, 1 cup ready-to-eat cereal, 3 cups popcorn, or  cup cooked rice, pasta, or cereal. Meats and other proteins Aim to eat 5-6 ounce-equivalents of protein each day. Examples of 1 ounce-equivalent of protein include 1 egg, 1/2 cup nuts or seeds, or 1 tablespoon (16 g) peanut butter. A cut of meat or fish that is the size of a deck of cards is about 3-4 ounce-equivalents.  Of the protein you eat each week, try to have at least 8 ounces come from seafood. This includes salmon, trout, herring, and anchovies. Dairy Aim to eat 3 cup-equivalents of fat-free or low-fat dairy each day. Examples of 1 cup-equivalent of dairy include 1 cup (240 mL) milk, 8 ounces (250 g) yogurt, 1 ounces (44 g) natural cheese, or 1 cup (240 mL) fortified soy milk. Fats and oils  Aim for about 5 teaspoons (21 g) per day. Choose monounsaturated fats, such as canola and olive oils, avocados, peanut butter, and most nuts, or polyunsaturated fats, such as sunflower, corn, and soybean oils, walnuts, pine nuts, sesame seeds, sunflower seeds, and flaxseed. Beverages  Aim for six 8-oz glasses of water per day. Limit coffee to three to five 8-oz cups per day.  Limit caffeinated beverages that have added calories, such as soda and energy drinks.  Limit alcohol intake to no more than 1 drink a day for nonpregnant women and 2 drinks a day for men. One drink equals 12 oz of beer (355 mL), 5 oz of wine (148 mL), or 1 oz of hard liquor (44 mL). Seasoning and other foods  Avoid adding excess amounts of salt to your  foods. Try flavoring foods with herbs and spices instead of salt.  Avoid adding sugar to foods.  Try using oil-based dressings, sauces, and spreads instead of solid fats. This information is based on general [place] nutrition guidelines. For more information, visit choosemyplate.gov. Exact amounts may vary based on your nutrition needs. Summary  A healthy eating plan may help you to maintain a healthy weight, reduce the risk of chronic diseases, and stay active throughout your life.  Plan your meals. Make sure you eat the right portions of a variety of nutrient-rich foods.  Try baking, boiling, grilling, or broiling instead of frying.  Choose healthy options in all settings, including home, work, school, restaurants, or stores. This information is not intended to replace advice given to you by your health care provider. Make sure you discuss any questions you have with your health care provider. Document Released: 06/26/2017 Document Revised: 06/26/2017 Document Reviewed: 06/26/2017 Elsevier Interactive Patient Education  2019 [employer].  

## 2018-06-26 NOTE — Telephone Encounter (Signed)
06/26/2018- I called the patient today and explained her the echocardiogram findings.  I also advised her to make an office visit appointment for further evaluation and management.  Despina Hick, MD

## 2018-06-26 NOTE — Progress Notes (Signed)
Presents today for Carla Hopkins   Patient Care Team: Forrest Moron, MD as PCP - General (Internal Medicine)     ADVANCE DIRECTIVES: Discussed: Yes On file  Immunization status:  Immunization History  Administered Date(s) Administered  . Pneumococcal Conjugate-13 08/24/2016  . Pneumococcal Polysaccharide-23 07/31/2014     Health Maintenance Due  Topic Date Due  . DEXA SCAN  12/30/2014  . OPHTHALMOLOGY EXAM  12/03/2015  . HEMOGLOBIN A1C  05/11/2018     Functional Status Survey: Is the patient deaf or have difficulty hearing?: No Does the patient have difficulty seeing, even when wearing glasses/contacts?: No Does the patient have difficulty concentrating, remembering, or making decisions?: No Does the patient have difficulty walking or climbing stairs?: Yes Does the patient have difficulty dressing or bathing?: No Does the patient have difficulty doing errands alone such as visiting a doctor's office or shopping?: No   6CIT Screen 06/26/2018  What Year? 0 points  What month? 0 points  What time? 0 points  Count back from 20 0 points  Months in reverse 0 points  Repeat phrase 0 points  Total Score 0        Clinical Support from 06/26/2018 in [facility]  AUDIT-C Score  1          Patient Active Problem List   Diagnosis Date Noted  . Aortic stenosis 05/13/2017  . Orthostatic hypotension 05/13/2017  . History of CVA with residual deficit 12/30/2014  . Left hemiparesis ([place]) 10/13/2014  . Left-sided neglect 10/13/2014  . History of right MCA stroke 08/06/2014  . Carotid stenosis 08/05/2014  . Acute right MCA stroke ([place]) 07/30/2014  . Cerebral infarction due to thrombosis of right middle cerebral artery ([place])   . Thyroid activity decreased   . Left-sided weakness   . Stroke ([place])   . Essential hypertension   . Carotid artery stenosis   . CVA (cerebral infarction) 07/24/2014  . Hyperlipidemia 07/24/2014  .  Elevated troponin 07/24/2014  . Tobacco abuse 07/24/2014  . Benign essential HTN 07/23/2014  . Obstructive sleep apnea 07/23/2014  . Hypothyroidism 07/03/2014  . BMI 38.0-38.9,adult 05/19/2013  . Nicotine addiction 05/19/2013  . Right atrial enlargement 05/19/2013     Past Medical History:  Diagnosis Date  . Hypertension   . Hypothyroidism   . Stroke [facility])      Past Surgical History:  Procedure Laterality Date  . ENDARTERECTOMY Right 08/05/2014   Procedure: Right Carotid ENDARTERECTOMY ;  Surgeon: Elam Dutch, MD;  Location: [place];  Service: Vascular;  Laterality: Right;  . LEG SURGERY    . PATCH ANGIOPLASTY Right 08/05/2014   Procedure: PATCH ANGIOPLASTY;  Surgeon: Elam Dutch, MD;  Location: [place];  Service: Vascular;  Laterality: Right;  . TUBAL LIGATION       Family History  Adopted: Yes  Problem Relation Age of Onset  . Colon cancer Neg Hx      Social History   Socioeconomic History  . Marital status: Single    Spouse name: Not on file  . Number of children: Not on file  . Years of education: Not on file  . Highest education level: Not on file  Occupational History  . Not on file  Social Needs  . Financial resource strain: Not on file  . Food insecurity:    Worry: Not on file    Inability: Not on file  . Transportation needs:    Medical:  Not on file    Non-medical: Not on file  Tobacco Use  . Smoking status: Former Smoker    Packs/day: 20.00    Types: Cigarettes  . Smokeless tobacco: Never Used  . Tobacco comment: quit smoking July 24, 2014  Substance and Sexual Activity  . Alcohol use: Yes    Alcohol/week: 0.0 standard drinks    Comment: once yearly  . Drug use: No  . Sexual activity: Not Currently  Lifestyle  . Physical activity:    Days per week: Not on file    Minutes per session: Not on file  . Stress: Not on file  Relationships  . Social connections:    Talks on phone: Not on file    Gets together: Not on file    Attends  religious service: Not on file    Active member of club or organization: Not on file    Attends meetings of clubs or organizations: Not on file    Relationship status: Not on file  . Intimate partner violence:    Fear of current or ex partner: Not on file    Emotionally abused: Not on file    Physically abused: Not on file    Forced sexual activity: Not on file  Other Topics Concern  . Not on file  Social History Narrative  . Not on file     No Known Allergies   Prior to Admission medications   Medication Sig Start Date End Date Taking? Authorizing Provider  atorvastatin (LIPITOR) 80 MG tablet TAKE 1 TABLET EVERY DAY 11/08/17  Yes Stallings, Zoe A, MD  diclofenac sodium (VOLTAREN) 1 % GEL Apply 2 g topically 4 (four) times daily. 05/14/18  Yes Stallings, Zoe A, MD  levothyroxine (SYNTHROID, LEVOTHROID) 88 MCG tablet TAKE 1 TABLET EVERY DAY. Take on an empty stomach first thing in the morning. Not with any other meds 11/08/17  Yes Delia Chimes A, MD  Multiple Vitamin (MULTIVITAMIN) tablet Take 1 tablet by mouth daily.   Yes [provider]  aspirin 81 MG chewable tablet Chew 1 tablet (81 mg total) by mouth daily. Patient not taking: Reported on 06/26/2018 07/27/14   Lorna Few, [place]     Depression screen [facility] 2/9 06/26/2018 05/14/2018 11/08/2017 04/21/2017 08/24/2016  Decreased Interest 0 0 0 - 0  Down, Depressed, Hopeless 0 0 0 0 0  PHQ - 2 Score 0 0 0 0 0  Altered sleeping - - - - -  Tired, decreased energy - - - - -  Change in appetite - - - - -  Feeling bad or failure about yourself  - - - - -  Trouble concentrating - - - - -  Moving slowly or fidgety/restless - - - - -  Suicidal thoughts - - - - -  PHQ-9 Score - - - - -     Fall Risk  06/26/2018 05/14/2018 11/08/2017 04/21/2017 08/24/2016  Falls in the past year? 0 0 No Yes No  Number falls in past yr: 0 0 - 1 -  Injury with Fall? 0 0 - Yes -  Follow up - Falls evaluation completed - - -      PHYSICAL EXAM:  Ht 5' 2.5" (1.588 m)   Wt 154 lb (69.9 kg)   BMI 27.72 kg/m    Wt Readings from Last 3 Encounters:  06/26/18 154 lb (69.9 kg)  05/14/18 168 lb 12.8 oz (76.6 kg)  11/08/17 151 lb 6.4 oz (68.7 kg)  No exam data present    Physical Exam   Education/Counseling provided regarding diet and exercise, prevention of chronic diseases, smoking/tobacco cessation, if applicable, and reviewed "Covered Medicare Preventive Services."   ASSESSMENT/PLAN: There are no diagnoses linked to this encounter.   Patient broke leg last year  Has stopped physical therapy until can resume after COVID Pain level when sitting/standing doesn't hurt.   Is doing exercise  Physical therapist has given  Lives alone 3 cats Has a group text with 6 neighbors every morning to check in each other. No scattered rugs Does have grab bars and bench in shower.  Will make an appointment with eye doctor  [facility] 06/26/2018  Patient states cardiologist changed aspirin to 325 mg tablet.

## 2018-11-12 ENCOUNTER — Ambulatory Visit: Payer: Medicare HMO | Admitting: Family Medicine

## 2018-11-13 ENCOUNTER — Telehealth (INDEPENDENT_AMBULATORY_CARE_PROVIDER_SITE_OTHER): Payer: Medicare HMO | Admitting: Family Medicine

## 2018-11-13 ENCOUNTER — Other Ambulatory Visit: Payer: Self-pay

## 2018-11-13 DIAGNOSIS — I10 Essential (primary) hypertension: Secondary | ICD-10-CM

## 2018-11-13 DIAGNOSIS — E119 Type 2 diabetes mellitus without complications: Secondary | ICD-10-CM

## 2018-11-13 DIAGNOSIS — E782 Mixed hyperlipidemia: Secondary | ICD-10-CM

## 2018-11-13 DIAGNOSIS — E034 Atrophy of thyroid (acquired): Secondary | ICD-10-CM

## 2018-11-13 NOTE — Progress Notes (Signed)
CC: follow up diabetes/htn.  Pt has no other concerns for provider.  Pt advises she has 30 day supply of all meds but will need refills in a month.  Advised we can refill all meds and note to pharmacist to place on hold.  Pt agreeable.  No travel outside [place] or [place] in the past 3 weeks.  No recent weight or bp taken.

## 2018-11-13 NOTE — Patient Instructions (Signed)
° ° ° °  If you have lab work done today you will be contacted with your lab results within the next 2 weeks.  If you have not heard from us then please contact us. The fastest way to get your results is to register for My Chart. ° ° °IF you received an x-ray today, you will receive an invoice from [facility]. Please contact  Radiology at [phone] with questions or concerns regarding your invoice.  ° °IF you received labwork today, you will receive an invoice from [facility]. Please contact [facility] at [phone] with questions or concerns regarding your invoice.  ° °Our billing staff will not be able to assist you with questions regarding bills from these companies. ° °You will be contacted with the lab results as soon as they are available. The fastest way to get your results is to activate your My Chart account. Instructions are located on the last page of this paperwork. If you have not heard from us regarding the results in 2 weeks, please contact this office. °  ° ° ° °

## 2018-11-13 NOTE — Progress Notes (Signed)
Telemedicine Encounter- SOAP NOTE Established Patient  This telephone encounter was conducted with the patient's (or proxy's) verbal consent via audio telecommunications: yes/no: Yes Patient was instructed to have this encounter in a suitably private space; and to only have persons present to whom they give permission to participate. In addition, patient identity was confirmed by use of name plus two identifiers (DOB and address).  I discussed the limitations, risks, security and privacy concerns of performing an evaluation and management service by telephone and the availability of in person appointments. I also discussed with the patient that there may be a patient responsible charge related to this service. The patient expressed understanding and agreed to proceed.  I spent a total of TIME; 0 MIN TO 60 MIN: 25 minutes talking with the patient or their proxy.  CC: hyperthyroidism, diabetes, hypertension   Subjective   Carla Hopkins is a 68 y.o. established patient. Telephone visit today for  HPI  Diabetes Mellitus: Patient presents for follow up of diabetes. Symptoms: hyperglycemia. Symptoms have stabilized. Patient denies hypoglycemia , nausea, polydipsia and polyuria.  Evaluation to date has been included: hemoglobin A1C.  Home sugars: BGs are running  consistent with Hgb A1C. She is diet controlled.  Previous A1c July 03, 2014        7.9% July 25, 2014      8.5% Oct 28, 2014         6.0% Jan 30, 2015         6.1% Aug 11, 2015       5.9% July 12, 2016      5.7% Jan 09, 2017       5.8% Nov 08, 2017       5.7%   Patient was diagnosed during her hospitalization for a stroke She no longer checks her blood glucose at home because t he numbers are very good. She is on statin and levothyroxine. She uses a healthy diet to manage her diabetes.   LVH Echo done March 2020 shows LVH with septum thickening and EF of 55-60% as well as mild aortic stenosis She has appt with Cardiology in  September She denies shortness of breat, lower extremity edema, palpitations or fatigue.   Hypertension: Patient here for follow-up of elevated blood pressure. She is exercising and is adherent to low salt diet.  Blood pressure is well controlled at home. Cardiac symptoms none. Patient denies chest pain, chest pressure/discomfort, claudication, exertional chest pressure/discomfort, fatigue, irregular heart beat and lower extremity edema.  Cardiovascular risk factors: diabetes mellitus and hypertension. Use of agents associated with hypertension: none. History of target organ damage: left ventricular hypertrophy.  Hypothyroidism: Patient presents for evaluation of thyroid function. Symptoms consist of denies fatigue, weight changes, heat/cold intolerance, bowel/skin changes or CVS symptoms.  The problem has been unchanged.  Previous thyroid studies include TSH. The hypothyroidism is due to hypothyroidism. Lab Results  Component Value Date   TSH 2.300 05/14/2018     Patient Active Problem List   Diagnosis Date Noted  . Aortic stenosis 05/13/2017  . Orthostatic hypotension 05/13/2017  . History of CVA with residual deficit 12/30/2014  . Left hemiparesis ([place]) 10/13/2014  . Left-sided neglect 10/13/2014  . History of right MCA stroke 08/06/2014  . Carotid stenosis 08/05/2014  . Acute right MCA stroke ([place]) 07/30/2014  . Cerebral infarction due to thrombosis of right middle cerebral artery ([place])   . Thyroid activity decreased   . Left-sided weakness   . Stroke ([place])   .  Essential hypertension   . Carotid artery stenosis   . CVA (cerebral infarction) 07/24/2014  . Hyperlipidemia 07/24/2014  . Elevated troponin 07/24/2014  . Tobacco abuse 07/24/2014  . Benign essential HTN 07/23/2014  . Obstructive sleep apnea 07/23/2014  . Hypothyroidism 07/03/2014  . BMI 38.0-38.9,adult 05/19/2013  . Nicotine addiction 05/19/2013  . Right atrial enlargement 05/19/2013    Past Medical History:   Diagnosis Date  . Hypertension   . Hypothyroidism   . Stroke [facility])     Current Outpatient Medications  Medication Sig Dispense Refill  . aspirin 81 MG chewable tablet Chew 1 tablet (81 mg total) by mouth daily. (Patient taking differently: Chew 325 mg by mouth daily. )    . diclofenac sodium (VOLTAREN) 1 % GEL Apply 2 g topically 4 (four) times daily. 100 g 3  . Multiple Vitamin (MULTIVITAMIN) tablet Take 1 tablet by mouth daily.    Marland Kitchen atorvastatin (LIPITOR) 80 MG tablet TAKE 1 TABLET EVERY DAY 90 tablet 3  . levothyroxine (SYNTHROID) 88 MCG tablet TAKE 1 TABLET EVERY DAY. TAKE ON AN EMPTY STOMACH FIRST THING IN THE MORNING. NOT WITH ANY OTHER MEDS. 90 tablet 3   Current Facility-Administered Medications  Medication Dose Route Frequency Provider Last Rate Last Dose  . 0.9 %  sodium chloride infusion  500 mL Intravenous Continuous Danis, Kirke Corin, MD        No Known Allergies  Social History   Socioeconomic History  . Marital status: Single    Spouse name: Not on file  . Number of children: Not on file  . Years of education: Not on file  . Highest education level: Not on file  Occupational History  . Not on file  Social Needs  . Financial resource strain: Not on file  . Food insecurity    Worry: Not on file    Inability: Not on file  . Transportation needs    Medical: Not on file    Non-medical: Not on file  Tobacco Use  . Smoking status: Former Smoker    Packs/day: 20.00    Types: Cigarettes  . Smokeless tobacco: Never Used  . Tobacco comment: quit smoking July 24, 2014  Substance and Sexual Activity  . Alcohol use: Yes    Alcohol/week: 0.0 standard drinks    Comment: once yearly  . Drug use: No  . Sexual activity: Not Currently  Lifestyle  . Physical activity    Days per week: Not on file    Minutes per session: Not on file  . Stress: Not on file  Relationships  . Social Herbalist on phone: Not on file    Gets together: Not on file     Attends religious service: Not on file    Active member of club or organization: Not on file    Attends meetings of clubs or organizations: Not on file    Relationship status: Not on file  . Intimate partner violence    Fear of current or ex partner: Not on file    Emotionally abused: Not on file    Physically abused: Not on file    Forced sexual activity: Not on file  Other Topics Concern  . Not on file  Social History Narrative  . Not on file    ROS Review of Systems  Constitutional: Negative for activity change, appetite change, chills and fever.  HENT: Negative for congestion, nosebleeds, trouble swallowing and voice change.   Respiratory: Negative for  cough, shortness of breath and wheezing.   Gastrointestinal: Negative for diarrhea, nausea and vomiting.  Genitourinary: Negative for difficulty urinating, dysuria, flank pain and hematuria.  Musculoskeletal: Negative for back pain, joint swelling and neck pain.  Neurological: Negative for dizziness, speech difficulty, light-headedness and numbness.  See HPI. All other review of systems negative.   Objective   Vitals as reported by the patient: BP Readings from Last 3 Encounters:  05/14/18 132/80  11/08/17 134/82  09/08/17 130/86    Normal pulmonary effort Unable to do exam  There were no vitals filed for this visit.   Lab Results  Component Value Date   CHOL 196 11/08/2017   CHOL 161 07/12/2016   CHOL 146 01/30/2015   Lab Results  Component Value Date   HDL 53 11/08/2017   HDL 41 07/12/2016   HDL 40 (L) 01/30/2015   Lab Results  Component Value Date   LDLCALC 115 (H) 11/08/2017   LDLCALC 95 07/12/2016   LDLCALC 80 01/30/2015   Lab Results  Component Value Date   TRIG 142 11/08/2017   TRIG 126 07/12/2016   TRIG 128 01/30/2015   Lab Results  Component Value Date   CHOLHDL 3.7 11/08/2017   CHOLHDL 3.9 07/12/2016   CHOLHDL 3.7 01/30/2015   No results found for: LDLDIRECT   Diagnoses and all  orders for this visit:  Essential hypertension - Patient's blood pressure is at goal of 139/89 or less. Condition is stable. Continue current medications and treatment plan. I recommend that you exercise for 30-45 minutes 5 days a week. I also recommend a balanced diet with fruits and vegetables every day, lean meats, and little fried foods. The DASH diet (you can find this online) is a good example of this.  -     CMP14+EGFR; Future  Mixed hyperlipidemia - previously labs reviewed, showing good control -     Lipid panel; Future -     CMP14+EGFR; Future -     TSH; Future  Hypothyroidism due to acquired atrophy of thyroid - normal thyroid levels, compliant with meds -     Lipid panel; Future -     CMP14+EGFR; Future -     TSH; Future  Diet-controlled diabetes mellitus ([place]) - stable with lifestyle modifications -     Hemoglobin A1c; Future     I discussed the assessment and treatment plan with the patient. The patient was provided an opportunity to ask questions and all were answered. The patient agreed with the plan and demonstrated an understanding of the instructions.   The patient was advised to call back or seek an in-person evaluation if the symptoms worsen or if the condition fails to improve as anticipated.  I provided 25 minutes of non-face-to-face time during this encounter.  Forrest Moron, MD  Primary Care at [facility]

## 2018-11-26 ENCOUNTER — Other Ambulatory Visit: Payer: Self-pay | Admitting: Family Medicine

## 2018-11-26 DIAGNOSIS — E034 Atrophy of thyroid (acquired): Secondary | ICD-10-CM

## 2018-11-26 NOTE — Telephone Encounter (Signed)
Please review for refill, prescription expired on 11/08/18, for levothyroxine  88 mcg. . Last TSH 05/14/18 Dr Nolon Rod

## 2018-12-04 ENCOUNTER — Ambulatory Visit: Payer: Medicare HMO | Admitting: Cardiology

## 2018-12-05 ENCOUNTER — Telehealth: Payer: Medicare HMO | Admitting: Cardiology

## 2018-12-06 ENCOUNTER — Other Ambulatory Visit: Payer: Self-pay | Admitting: Family Medicine

## 2018-12-06 DIAGNOSIS — E782 Mixed hyperlipidemia: Secondary | ICD-10-CM

## 2019-05-13 ENCOUNTER — Ambulatory Visit: Payer: Medicare HMO | Attending: Internal Medicine

## 2019-05-13 ENCOUNTER — Other Ambulatory Visit: Payer: Self-pay

## 2019-05-13 DIAGNOSIS — Z23 Encounter for immunization: Secondary | ICD-10-CM | POA: Insufficient documentation

## 2019-05-13 NOTE — Progress Notes (Signed)
   Covid-19 Vaccination Clinic  Name:  Carla Hopkins    MRN: [id number] DOB: 1951/03/01  05/13/2019  [name] was observed post Covid-19 immunization for 15 minutes without incidence. She was provided with Vaccine Information Sheet and instruction to access the V-Safe system.   [name] was instructed to call 911 with any severe reactions post vaccine: Marland Kitchen Difficulty breathing  . Swelling of your face and throat  . A fast heartbeat  . A bad rash all over your body  . Dizziness and weakness    Immunizations Administered    Name Date Dose VIS Date Route   Moderna COVID-19 Vaccine 05/13/2019  1:45 AM 0.5 mL 02/26/2019 Intramuscular   Manufacturer: Moderna   Lot: [id number]   [id number]

## 2019-06-11 ENCOUNTER — Ambulatory Visit: Payer: Medicare HMO | Attending: Internal Medicine

## 2019-06-11 DIAGNOSIS — Z23 Encounter for immunization: Secondary | ICD-10-CM

## 2019-06-11 NOTE — Progress Notes (Signed)
   Covid-19 Vaccination Clinic  Name:  Wandra Kazemi    MRN: [id number] DOB: Oct 25, 1950  06/11/2019  Ms. Botero was observed post Covid-19 immunization for 15 minutes without incident. She was provided with Vaccine Information Sheet and instruction to access the V-Safe system.   Ms. Brightman was instructed to call 911 with any severe reactions post vaccine: Marland Kitchen Difficulty breathing  . Swelling of face and throat  . A fast heartbeat  . A bad rash all over body  . Dizziness and weakness   Immunizations Administered    Name Date Dose VIS Date Route   Moderna COVID-19 Vaccine 06/11/2019  1:54 PM 0.5 mL 02/26/2019 Intramuscular   Manufacturer: Moderna   Lot: [id number]   [place]:[id number]

## 2019-08-13 ENCOUNTER — Telehealth: Payer: Self-pay | Admitting: *Deleted

## 2019-08-13 NOTE — Telephone Encounter (Signed)
Schedule AWV.  

## 2020-01-08 ENCOUNTER — Encounter: Payer: Self-pay | Admitting: Registered Nurse

## 2020-01-08 ENCOUNTER — Other Ambulatory Visit: Payer: Self-pay

## 2020-01-08 ENCOUNTER — Ambulatory Visit: Payer: Medicare HMO | Admitting: Registered Nurse

## 2020-01-08 VITALS — BP 160/95 | HR 110 | Temp 98.3°F | Resp 18 | Ht 62.5 in | Wt 169.2 lb

## 2020-01-08 DIAGNOSIS — Z23 Encounter for immunization: Secondary | ICD-10-CM

## 2020-01-08 DIAGNOSIS — E782 Mixed hyperlipidemia: Secondary | ICD-10-CM

## 2020-01-08 DIAGNOSIS — E034 Atrophy of thyroid (acquired): Secondary | ICD-10-CM | POA: Diagnosis not present

## 2020-01-08 DIAGNOSIS — E119 Type 2 diabetes mellitus without complications: Secondary | ICD-10-CM | POA: Diagnosis not present

## 2020-01-08 DIAGNOSIS — I10 Essential (primary) hypertension: Secondary | ICD-10-CM

## 2020-01-08 MED ORDER — LEVOTHYROXINE SODIUM 88 MCG PO TABS: 88.0000 ug | ORAL_TABLET | Freq: Every day | ORAL | 3 refills | Status: DC

## 2020-01-08 MED ORDER — ATORVASTATIN CALCIUM 80 MG PO TABS: 80.0000 mg | ORAL_TABLET | Freq: Every day | ORAL | 3 refills | Status: DC

## 2020-01-08 NOTE — Patient Instructions (Signed)
° ° ° °  If you have lab work done today you will be contacted with your lab results within the next 2 weeks.  If you have not heard from us then please contact us. The fastest way to get your results is to register for My Chart. ° ° °IF you received an x-ray today, you will receive an invoice from [facility]. Please contact [facility] at [phone] with questions or concerns regarding your invoice.  ° °IF you received labwork today, you will receive an invoice from [facility]. Please contact [facility] at [phone] with questions or concerns regarding your invoice.  ° °Our billing staff will not be able to assist you with questions regarding bills from these companies. ° °You will be contacted with the lab results as soon as they are available. The fastest way to get your results is to activate your My Chart account. Instructions are located on the last page of this paperwork. If you have not heard from us regarding the results in 2 weeks, please contact this office. °  ° ° ° °

## 2020-01-08 NOTE — Progress Notes (Signed)
Established Patient Office Visit  Subjective:  Patient ID: Carla Hopkins, female    DOB: June 16, 1950  Age: 69 y.o. MRN: [id number]  CC:  Chief Complaint  Patient presents with   Medication Refill    Patient states that she is here for an medication refill. Also a tdap shot.    HPI [name] presents for follow up and med refill  TSH: hypothyroidism due to atrophy of thyroid. Has been on 36mcg levothyroxine since initial therapy. No changs have been made. She is familiar with symptoms of hypo and hyperthyroidism. Denies these at this time  HLD: Has been on atorvastatin 40mg  PO qd. Good effect. No AEs from medication. Feeling well overall.  Has been taking many precautions from [place]. Is nervous to be here today but overall feels her mental health is stable.   Past Medical History:  Diagnosis Date   Hypertension    Hypothyroidism    Stroke [facility])     Past Surgical History:  Procedure Laterality Date   ENDARTERECTOMY Right 08/05/2014   Procedure: Right Carotid ENDARTERECTOMY ;  Surgeon: Elam Dutch, MD;  Location: [facility] OR;  Service: Vascular;  Laterality: Right;   LEG SURGERY     PATCH ANGIOPLASTY Right 08/05/2014   Procedure: PATCH ANGIOPLASTY;  Surgeon: Elam Dutch, MD;  Location: [facility] OR;  Service: Vascular;  Laterality: Right;   TUBAL LIGATION      Family History  Adopted: Yes  Problem Relation Age of Onset   Colon cancer Neg Hx     Social History   Socioeconomic History   Marital status: Single    Spouse name: Not on file   Number of children: Not on file   Years of education: Not on file   Highest education level: Not on file  Occupational History   Not on file  Tobacco Use   Smoking status: Former Smoker    Packs/day: 20.00    Types: Cigarettes   Smokeless tobacco: Never Used   Tobacco comment: quit smoking July 24, 2014  Substance and Sexual Activity   Alcohol use: Yes    Alcohol/week: 0.0 standard drinks    Comment:  once yearly   Drug use: No   Sexual activity: Not Currently  Other Topics Concern   Not on file  Social History Narrative   Not on file   Social Determinants of Health   Financial Resource Strain:    Difficulty of Paying Living Expenses: Not on file  Food Insecurity:    Worried About [place] in the Last Year: Not on file   Ran Out of Food in the Last Year: Not on file  Transportation Needs:    Lack of Transportation (Medical): Not on file   Lack of Transportation (Non-Medical): Not on file  Physical Activity:    Days of Exercise per Week: Not on file   Minutes of Exercise per Session: Not on file  Stress:    Feeling of Stress : Not on file  Social Connections:    Frequency of Communication with Friends and Family: Not on file   Frequency of Social Gatherings with Friends and Family: Not on file   Attends Religious Services: Not on file   Active Member of Clubs or Organizations: Not on file   Attends Archivist Meetings: Not on file   Marital Status: Not on file  Intimate Partner Violence:    Fear of Current or Ex-Partner: Not on file   Emotionally Abused: Not on  file   Physically Abused: Not on file   Sexually Abused: Not on file    Outpatient Medications Prior to Visit  Medication Sig Dispense Refill   aspirin 81 MG chewable tablet Chew 1 tablet (81 mg total) by mouth daily. (Patient taking differently: Chew 325 mg by mouth daily. )     Multiple Vitamin (MULTIVITAMIN) tablet Take 1 tablet by mouth daily.     atorvastatin (LIPITOR) 80 MG tablet TAKE 1 TABLET EVERY DAY 90 tablet 3   levothyroxine (SYNTHROID) 88 MCG tablet TAKE 1 TABLET EVERY DAY. TAKE ON AN EMPTY STOMACH FIRST THING IN THE MORNING. NOT WITH ANY OTHER MEDS. 90 tablet 3   diclofenac sodium (VOLTAREN) 1 % GEL Apply 2 g topically 4 (four) times daily. (Patient not taking: Reported on 01/08/2020) 100 g 3   Facility-Administered Medications Prior to Visit    Medication Dose Route Frequency Provider Last Rate Last Admin   0.9 %  sodium chloride infusion  500 mL Intravenous Continuous Danis, Estill Cotta III, MD        No Known Allergies  ROS Review of Systems  Constitutional: Negative.   HENT: Negative.   Eyes: Negative.   Respiratory: Negative.   Cardiovascular: Negative.   Gastrointestinal: Negative.   Genitourinary: Negative.   Musculoskeletal: Negative.   Skin: Negative.   Neurological: Negative.   Psychiatric/Behavioral: Negative.       Objective:    Physical Exam Vitals and nursing note reviewed.  Constitutional:      General: She is not in acute distress.    Appearance: Normal appearance. She is normal weight. She is not ill-appearing, toxic-appearing or diaphoretic.  Cardiovascular:     Rate and Rhythm: Normal rate and regular rhythm.     Heart sounds: Normal heart sounds. No murmur heard.  No friction rub. No gallop.   Pulmonary:     Effort: Pulmonary effort is normal. No respiratory distress.     Breath sounds: Normal breath sounds. No stridor. No wheezing, rhonchi or rales.  Chest:     Chest wall: No tenderness.  Skin:    General: Skin is warm and dry.  Neurological:     General: No focal deficit present.     Mental Status: She is alert and oriented to person, place, and time. Mental status is at baseline.  Psychiatric:        Mood and Affect: Mood normal.        Behavior: Behavior normal.        Thought Content: Thought content normal.        Judgment: Judgment normal.     BP (!) 160/95    Pulse (!) 110    Temp 98.3 F (36.8 C) (Temporal)    Resp 18    Ht 5' 2.5" (1.588 m)    Wt 169 lb 3.2 oz (76.7 kg)    SpO2 94%    BMI 30.45 kg/m  Wt Readings from Last 3 Encounters:  01/08/20 169 lb 3.2 oz (76.7 kg)  06/26/18 154 lb (69.9 kg)  05/14/18 168 lb 12.8 oz (76.6 kg)     Health Maintenance Due  Topic Date Due   DEXA SCAN  Never done   HEMOGLOBIN A1C  05/11/2018    There are no preventive care  reminders to display for this patient.  Lab Results  Component Value Date   TSH 2.300 05/14/2018   Lab Results  Component Value Date   WBC 10.9 (H) 04/21/2017   HGB 11.4 04/21/2017  HCT 35.8 04/21/2017   MCV 91 04/21/2017   PLT 316 04/21/2017   Lab Results  Component Value Date   NA 144 05/14/2018   K 4.6 05/14/2018   CO2 16 (L) 05/14/2018   GLUCOSE 98 05/14/2018   BUN 16 05/14/2018   CREATININE 1.05 (H) 05/14/2018   BILITOT 0.2 05/14/2018   ALKPHOS 131 (H) 05/14/2018   AST 31 05/14/2018   ALT 24 05/14/2018   PROT 6.8 05/14/2018   ALBUMIN 4.0 05/14/2018   CALCIUM 9.6 05/14/2018   ANIONGAP 8 08/07/2014   Lab Results  Component Value Date   CHOL 196 11/08/2017   Lab Results  Component Value Date   HDL 53 11/08/2017   Lab Results  Component Value Date   LDLCALC 115 (H) 11/08/2017   Lab Results  Component Value Date   TRIG 142 11/08/2017   Lab Results  Component Value Date   CHOLHDL 3.7 11/08/2017   Lab Results  Component Value Date   HGBA1C 5.7 (H) 11/08/2017      Assessment & Plan:   Problem List Items Addressed This Visit      Cardiovascular and Mediastinum   Essential hypertension - Primary   Relevant Medications   atorvastatin (LIPITOR) 80 MG tablet   Other Relevant Orders   Basic Metabolic Panel     Endocrine   Hypothyroidism   Relevant Medications   levothyroxine (SYNTHROID) 88 MCG tablet   Other Relevant Orders   Thyroid Panel With TSH     Other   Hyperlipidemia   Relevant Medications   atorvastatin (LIPITOR) 80 MG tablet   Other Relevant Orders   Lipid Panel    Other Visit Diagnoses    Diet-controlled diabetes mellitus ([place])       Relevant Medications   atorvastatin (LIPITOR) 80 MG tablet   Other Relevant Orders   Hemoglobin A1c   Need for diphtheria-tetanus-pertussis (Tdap) vaccine       Relevant Orders   Td vaccine greater than or equal to 7yo preservative free IM (Completed)      Meds ordered this encounter    Medications   atorvastatin (LIPITOR) 80 MG tablet    Sig: Take 1 tablet (80 mg total) by mouth daily.    Dispense:  90 tablet    Refill:  3    Order Specific Question:   Supervising Provider    Answer:   Carlota Raspberry, JEFFREY R [[id number]]   levothyroxine (SYNTHROID) 88 MCG tablet    Sig: Take 1 tablet (88 mcg total) by mouth daily before breakfast.    Dispense:  90 tablet    Refill:  3    Order Specific Question:   Supervising Provider    Answer:   Carlota Raspberry, JEFFREY R [[id number]]    Follow-up: No follow-ups on file.   PLAN  Labs collected  meds refilled  Will follow up as warranted based on labs - if wnl then 6-12 mo  Patient encouraged to call clinic with any questions, comments, or concerns.  Maximiano Coss, NP

## 2020-01-09 LAB — LIPID PANEL
Chol/HDL Ratio: 3.4 ratio (ref 0.0–4.4)
Cholesterol, Total: 181 mg/dL (ref 100–199)
HDL: 54 mg/dL (ref >39)
LDL Chol Calc (NIH): 107 mg/dL — ABNORMAL HIGH (ref 0–99)
Triglycerides: 113 mg/dL (ref 0–149)
VLDL Cholesterol Cal: 20 mg/dL (ref 5–40)

## 2020-01-09 LAB — BASIC METABOLIC PANEL
BUN/Creatinine Ratio: 17 (ref 12–28)
BUN: 17 mg/dL (ref 8–27)
CO2: 24 mmol/L (ref 20–29)
Calcium: 9.9 mg/dL (ref 8.7–10.3)
Chloride: 107 mmol/L — ABNORMAL HIGH (ref 96–106)
Creatinine, Ser: 1 mg/dL (ref 0.57–1.00)
GFR calc Af Amer: 66 mL/min/{1.73_m2} (ref >59)
GFR calc non Af Amer: 57 mL/min/{1.73_m2} — ABNORMAL LOW (ref >59)
Glucose: 124 mg/dL — ABNORMAL HIGH (ref 65–99)
Potassium: 3.8 mmol/L (ref 3.5–5.2)
Sodium: 145 mmol/L — ABNORMAL HIGH (ref 134–144)

## 2020-01-09 LAB — THYROID PANEL WITH TSH
Free Thyroxine Index: 3.5 (ref 1.2–4.9)
T3 Uptake Ratio: 29 % (ref 24–39)
T4, Total: 12.2 ug/dL — ABNORMAL HIGH (ref 4.5–12.0)
TSH: 1.52 u[IU]/mL (ref 0.450–4.500)

## 2020-01-09 LAB — HEMOGLOBIN A1C
Est. average glucose Bld gHb Est-mCnc: 126 mg/dL
Hgb A1c MFr Bld: 6 % — ABNORMAL HIGH (ref 4.8–5.6)

## 2020-04-02 ENCOUNTER — Telehealth: Payer: Self-pay | Admitting: Registered Nurse

## 2020-04-06 ENCOUNTER — Encounter: Payer: Medicare HMO | Admitting: Registered Nurse

## 2020-06-10 ENCOUNTER — Telehealth: Payer: Self-pay

## 2020-06-10 DIAGNOSIS — E782 Mixed hyperlipidemia: Secondary | ICD-10-CM

## 2020-06-10 DIAGNOSIS — E034 Atrophy of thyroid (acquired): Secondary | ICD-10-CM

## 2020-06-10 MED ORDER — ATORVASTATIN CALCIUM 80 MG PO TABS: 80.0000 mg | ORAL_TABLET | Freq: Every day | ORAL | 0 refills | Status: DC

## 2020-06-10 MED ORDER — LEVOTHYROXINE SODIUM 88 MCG PO TABS: 88.0000 ug | ORAL_TABLET | Freq: Every day | ORAL | 0 refills | Status: DC

## 2020-06-10 NOTE — Addendum Note (Signed)
Addended by: Osie Cheeks A on: 06/10/2020 02:02 PM   Modules accepted: Orders

## 2020-06-10 NOTE — Telephone Encounter (Signed)
Pt. Called asking for 3 months worth of atorvastatin and levothyroxine to last her until she establishes with a new provider

## 2020-06-10 NOTE — Telephone Encounter (Signed)
[facility] refill sent in on the requested medications.

## 2020-12-02 ENCOUNTER — Telehealth: Payer: Self-pay | Admitting: Registered Nurse

## 2020-12-02 NOTE — Telephone Encounter (Signed)
Left message for patient to call back and schedule Medicare Annual Wellness Visit (AWV).   Please offer to do virtually or by telephone.  Left office number and my jabber [phone].  Last AWV:06/26/2018  Please schedule at anytime with Nurse Health Advisor.

## 2020-12-17 ENCOUNTER — Telehealth: Payer: Self-pay

## 2020-12-17 ENCOUNTER — Other Ambulatory Visit: Payer: Self-pay

## 2020-12-17 DIAGNOSIS — E782 Mixed hyperlipidemia: Secondary | ICD-10-CM

## 2020-12-17 MED ORDER — ATORVASTATIN CALCIUM 80 MG PO TABS: 80.0000 mg | ORAL_TABLET | Freq: Every day | ORAL | 0 refills | Status: DC

## 2020-12-17 NOTE — Telephone Encounter (Signed)
Rx filled and sent to patient pharmacy 

## 2020-12-17 NOTE — Telephone Encounter (Signed)
Pt needs refill on atorvastatin (LIPITOR) 80 MG tablet , levothyroxine (SYNTHROID) 88 MCG tablet   Pt uses [facility] now   Pt call back [phone]

## 2020-12-29 ENCOUNTER — Telehealth: Payer: Self-pay

## 2020-12-29 DIAGNOSIS — E782 Mixed hyperlipidemia: Secondary | ICD-10-CM

## 2020-12-29 DIAGNOSIS — E034 Atrophy of thyroid (acquired): Secondary | ICD-10-CM

## 2020-12-29 MED ORDER — LEVOTHYROXINE SODIUM 88 MCG PO TABS: 88.0000 ug | ORAL_TABLET | Freq: Every day | ORAL | 0 refills | Status: DC

## 2020-12-29 MED ORDER — ATORVASTATIN CALCIUM 80 MG PO TABS: 80.0000 mg | ORAL_TABLET | Freq: Every day | ORAL | 0 refills | Status: DC

## 2020-12-29 NOTE — Telephone Encounter (Signed)
Also note that patient is going to [facility]

## 2020-12-29 NOTE — Telephone Encounter (Signed)
Pt made appt for 10/06 for Med check and pt is out of medication for atorvastatin (LIPITOR) 80 MG tablet and levothyroxine (SYNTHROID) 88 MCG tablet

## 2020-12-29 NOTE — Telephone Encounter (Signed)
Reviewed and sent in 30 days worth of both Rx to the [facility]

## 2020-12-31 ENCOUNTER — Other Ambulatory Visit: Payer: Self-pay

## 2020-12-31 ENCOUNTER — Telehealth (INDEPENDENT_AMBULATORY_CARE_PROVIDER_SITE_OTHER): Payer: Medicare HMO | Admitting: Registered Nurse

## 2020-12-31 ENCOUNTER — Encounter: Payer: Self-pay | Admitting: Registered Nurse

## 2020-12-31 DIAGNOSIS — E034 Atrophy of thyroid (acquired): Secondary | ICD-10-CM | POA: Diagnosis not present

## 2020-12-31 DIAGNOSIS — E119 Type 2 diabetes mellitus without complications: Secondary | ICD-10-CM

## 2020-12-31 DIAGNOSIS — I10 Essential (primary) hypertension: Secondary | ICD-10-CM

## 2020-12-31 DIAGNOSIS — E782 Mixed hyperlipidemia: Secondary | ICD-10-CM

## 2020-12-31 MED ORDER — LEVOTHYROXINE SODIUM 88 MCG PO TABS: 88.0000 ug | ORAL_TABLET | Freq: Every day | ORAL | 3 refills | Status: DC

## 2020-12-31 MED ORDER — ATORVASTATIN CALCIUM 80 MG PO TABS: 80.0000 mg | ORAL_TABLET | Freq: Every day | ORAL | 3 refills | Status: DC

## 2020-12-31 NOTE — Progress Notes (Signed)
Telemedicine Encounter- SOAP NOTE Established Patient  This telephone encounter was conducted with the patient's (or proxy's) verbal consent via audio telecommunications: yes Patient was instructed to have this encounter in a suitably private space; and to only have persons present to whom they give permission to participate. In addition, patient identity was confirmed by use of name plus two identifiers (DOB and address).  I discussed the limitations, risks, security and privacy concerns of performing an evaluation and management service by telephone and the availability of in person appointments. I also discussed with the patient that there may be a patient responsible charge related to this service. The patient expressed understanding and agreed to proceed.  I spent a total of 14 minutes talking with the patient or their proxy.  Patient at home Provider in office  Participants: Carla Ruddy, NP and Kerrie Buffalo  Chief Complaint  Patient presents with   Follow-up    Med recheck    Subjective   Carla Hopkins is a 70 y.o. established patient. Telephone visit today for med check  HPI T2dm: Diet controlled Does not check sugars Lab Results  Component Value Date   HGBA1C 6.0 (H) 45/62/5638  No complications noted at this time. Hopes to continue lifestyle control  Htn: Similarly, lifestyle control No CV symptoms. Feeling well overall Does not check home bp  Hld: Taking atorvastatin 80mg  po qd Has been on for some time, no AE Hopes to continue Endorses healthy lifestyle choices Lab Results  Component Value Date   CHOL 181 01/08/2020   HDL 54 01/08/2020   LDLCALC 107 (H) 01/08/2020   TRIG 113 01/08/2020   CHOLHDL 3.4 01/08/2020   Hypothyroid; Taking synthroid 73mcg po qd Good effect, no AE Hopes to continue Well aware of symptoms of dysthyroid state. None present.  Otherwise no concerns. Feels very healthy overall.    Patient Active Problem List    Diagnosis Date Noted   Aortic stenosis 05/13/2017   Orthostatic hypotension 05/13/2017   CVA, old, hemiparesis ([place]) 03/31/2017   Closed displaced intertrochanteric fracture of left femur ([place]) 03/19/2017   History of CVA with residual deficit 12/30/2014   Left hemiparesis ([place]) 10/13/2014   Left-sided neglect 10/13/2014   History of right MCA stroke 08/06/2014   Carotid stenosis 08/05/2014   Acute right MCA stroke ([place]) 07/30/2014   Cerebral infarction due to thrombosis of right middle cerebral artery (HCC)    Thyroid activity decreased    Left-sided weakness    Stroke [facility])    Essential hypertension    Carotid artery stenosis    CVA (cerebral infarction) 07/24/2014   Hyperlipidemia 07/24/2014   Elevated troponin 07/24/2014   Tobacco abuse 07/24/2014   Benign essential HTN 07/23/2014   Obstructive sleep apnea 07/23/2014   Hypothyroidism 07/03/2014   BMI 38.0-38.9,adult 05/19/2013   Nicotine addiction 05/19/2013   Right atrial enlargement 05/19/2013    Past Medical History:  Diagnosis Date   Hypertension    Hypothyroidism    Stroke [facility])     Current Outpatient Medications  Medication Sig Dispense Refill   aspirin 81 MG chewable tablet Chew 1 tablet (81 mg total) by mouth daily. (Patient taking differently: Chew 325 mg by mouth daily.)     MODERNA COVID-19 VACCINE 100 MCG/0.5ML injection      Multiple Vitamin (MULTIVITAMIN) tablet Take 1 tablet by mouth daily.     atorvastatin (LIPITOR) 80 MG tablet Take 1 tablet (80 mg total) by mouth daily. 90 tablet 3   levothyroxine (  SYNTHROID) 88 MCG tablet Take 1 tablet (88 mcg total) by mouth daily before breakfast. 90 tablet 3   Current Facility-Administered Medications  Medication Dose Route Frequency Provider Last Rate Last Admin   0.9 %  sodium chloride infusion  500 mL Intravenous Continuous Danis, Kirke Corin, MD        Allergies  Allergen Reactions   Docusate Sodium Diarrhea    Social History   Socioeconomic  History   Marital status: Single    Spouse name: Not on file   Number of children: Not on file   Years of education: Not on file   Highest education level: Not on file  Occupational History   Not on file  Tobacco Use   Smoking status: Former    Packs/day: 20.00    Types: Cigarettes   Smokeless tobacco: Never   Tobacco comments:    quit smoking July 24, 2014  Substance and Sexual Activity   Alcohol use: Yes    Alcohol/week: 0.0 standard drinks    Comment: once yearly   Drug use: No   Sexual activity: Not Currently  Other Topics Concern   Not on file  Social History Narrative   Not on file   Social Determinants of Health   Financial Resource Strain: Not on file  Food Insecurity: Not on file  Transportation Needs: Not on file  Physical Activity: Not on file  Stress: Not on file  Social Connections: Not on file  Intimate Partner Violence: Not on file    Review of Systems  Constitutional: Negative.   HENT: Negative.    Eyes: Negative.   Respiratory: Negative.    Cardiovascular: Negative.   Gastrointestinal: Negative.   Genitourinary: Negative.   Musculoskeletal: Negative.   Skin: Negative.   Neurological: Negative.   Endo/Heme/Allergies: Negative.   Psychiatric/Behavioral: Negative.     Objective   Vitals as reported by the patient: There were no vitals filed for this visit.  Carla Hopkins was seen today for follow-up.  Diagnoses and all orders for this visit:  Diet-controlled diabetes mellitus ([place]) -     Hemoglobin A1c; Future -     Lipid panel; Future  Hypothyroidism due to acquired atrophy of thyroid -     levothyroxine (SYNTHROID) 88 MCG tablet; Take 1 tablet (88 mcg total) by mouth daily before breakfast. -     CBC with Differential/Platelet; Future -     Hemoglobin A1c; Future -     TSH; Future -     Lipid panel; Future -     Comprehensive metabolic panel; Future  Mixed hyperlipidemia -     atorvastatin (LIPITOR) 80 MG tablet; Take 1 tablet (80  mg total) by mouth daily. -     CBC with Differential/Platelet; Future -     Hemoglobin A1c; Future -     TSH; Future -     Lipid panel; Future -     Comprehensive metabolic panel; Future  Essential hypertension -     CBC with Differential/Platelet; Future -     Hemoglobin A1c; Future -     TSH; Future -     Lipid panel; Future -     Comprehensive metabolic panel; Future   PLAN Meds refilled. Future lab orders placed. Pt to seek labs either here or at [facility] lab facility. Maintain current regimen pending labs. Patient encouraged to call clinic with any questions, comments, or concerns.  I discussed the assessment and treatment plan with the patient. The patient was provided  an opportunity to ask questions and all were answered. The patient agreed with the plan and demonstrated an understanding of the instructions.   The patient was advised to call back or seek an in-person evaluation if the symptoms worsen or if the condition fails to improve as anticipated.  I provided 14 minutes of non-face-to-face time during this encounter.  Maximiano Coss, NP

## 2020-12-31 NOTE — Progress Notes (Signed)
I connected with  Carla Hopkins on 12/31/20 by a video enabled telemedicine application and verified that I am speaking with the correct person using two identifiers.   I discussed the limitations of evaluation and management by telemedicine. The patient expressed understanding and agreed to proceed.

## 2021-01-05 NOTE — Patient Instructions (Signed)
Ms. Ballow -   Doristine Devoid to speak with you  Labs - can be done here in [place] or at the [facility] site:  [facility] [address] [place], [place] [address] ([place], [place] [address] [phone]  Give either spot a call to let them know you have lab orders in that you need drawn. I'd like to have these done within a couple of weeks. Fasting if possible  Thank you!  Stay well!  Rich

## 2021-02-01 ENCOUNTER — Ambulatory Visit: Payer: Medicare HMO

## 2021-02-02 ENCOUNTER — Other Ambulatory Visit: Payer: Self-pay | Admitting: Registered Nurse

## 2021-02-02 DIAGNOSIS — E034 Atrophy of thyroid (acquired): Secondary | ICD-10-CM

## 2021-02-11 ENCOUNTER — Ambulatory Visit (INDEPENDENT_AMBULATORY_CARE_PROVIDER_SITE_OTHER): Payer: Medicare HMO

## 2021-02-11 DIAGNOSIS — Z0000 Encounter for general adult medical examination without abnormal findings: Secondary | ICD-10-CM | POA: Diagnosis not present

## 2021-02-11 NOTE — Patient Instructions (Signed)
Carla Hopkins , Thank you for taking time to come for your Medicare Wellness Visit. I appreciate your ongoing commitment to your health goals. Please review the following plan we discussed and let me know if I can assist you in the future.   Screening recommendations/referrals: Colonoscopy: 11/17/2016  due 2023 Mammogram: patient declines referral  Bone Density: patient declines referral  Recommended yearly ophthalmology/optometry visit for glaucoma screening and checkup Recommended yearly dental visit for hygiene and checkup  Vaccinations: Influenza vaccine: completed  Pneumococcal vaccine: completed  Tdap vaccine: 01/08/2020 Shingles vaccine: completed     Advanced directives: will provide copies   Conditions/risks identified: none   Next appointment: none    Preventive Care 49 Years and Older, Female Preventive care refers to lifestyle choices and visits with your health care provider that can promote health and wellness. What does preventive care include? A yearly physical exam. This is also called an annual well check. Dental exams once or twice a year. Routine eye exams. Ask your health care provider how often you should have your eyes checked. Personal lifestyle choices, including: Daily care of your teeth and gums. Regular physical activity. Eating a healthy diet. Avoiding tobacco and drug use. Limiting alcohol use. Practicing safe sex. Taking low-dose aspirin every day. Taking vitamin and mineral supplements as recommended by your health care provider. What happens during an annual well check? The services and screenings done by your health care provider during your annual well check will depend on your age, overall health, lifestyle risk factors, and family history of disease. Counseling  Your health care provider may ask you questions about your: Alcohol use. Tobacco use. Drug use. Emotional well-being. Home and relationship well-being. Sexual activity. Eating  habits. History of falls. Memory and ability to understand (cognition). Work and work Statistician. Reproductive health. Screening  You may have the following tests or measurements: Height, weight, and BMI. Blood pressure. Lipid and cholesterol levels. These may be checked every 5 years, or more frequently if you are over 44 years old. Skin check. Lung cancer screening. You may have this screening every year starting at age 34 if you have a 30-pack-year history of smoking and currently smoke or have quit within the past 15 years. Fecal occult blood test (FOBT) of the stool. You may have this test every year starting at age 31. Flexible sigmoidoscopy or colonoscopy. You may have a sigmoidoscopy every 5 years or a colonoscopy every 10 years starting at age 71. Hepatitis C blood test. Hepatitis B blood test. Sexually transmitted disease (STD) testing. Diabetes screening. This is done by checking your blood sugar (glucose) after you have not eaten for a while (fasting). You may have this done every 1-3 years. Bone density scan. This is done to screen for osteoporosis. You may have this done starting at age 64. Mammogram. This may be done every 1-2 years. Talk to your health care provider about how often you should have regular mammograms. Talk with your health care provider about your test results, treatment options, and if necessary, the need for more tests. Vaccines  Your health care provider may recommend certain vaccines, such as: Influenza vaccine. This is recommended every year. Tetanus, diphtheria, and acellular pertussis (Tdap, Td) vaccine. You may need a Td booster every 10 years. Zoster vaccine. You may need this after age 64. Pneumococcal 13-valent conjugate (PCV13) vaccine. One dose is recommended after age 62. Pneumococcal polysaccharide (PPSV23) vaccine. One dose is recommended after age 48. Talk to your health care provider  about which screenings and vaccines you need and how  often you need them. This information is not intended to replace advice given to you by your health care provider. Make sure you discuss any questions you have with your health care provider. Document Released: 04/10/2015 Document Revised: 12/02/2015 Document Reviewed: 01/13/2015 Elsevier Interactive Patient Education  2017 [place] Prevention in the Home Falls can cause injuries. They can happen to people of all ages. There are many things you can do to make your home safe and to help prevent falls. What can I do on the outside of my home? Regularly fix the edges of walkways and driveways and fix any cracks. Remove anything that might make you trip as you walk through a door, such as a raised step or threshold. Trim any bushes or trees on the path to your home. Use bright outdoor lighting. Clear any walking paths of anything that might make someone trip, such as rocks or tools. Regularly check to see if handrails are loose or broken. Make sure that both sides of any steps have handrails. Any raised decks and porches should have guardrails on the edges. Have any leaves, snow, or ice cleared regularly. Use sand or salt on walking paths during winter. Clean up any spills in your garage right away. This includes oil or grease spills. What can I do in the bathroom? Use night lights. Install grab bars by the toilet and in the tub and shower. Do not use towel bars as grab bars. Use non-skid mats or decals in the tub or shower. If you need to sit down in the shower, use a plastic, non-slip stool. Keep the floor dry. Clean up any water that spills on the floor as soon as it happens. Remove soap buildup in the tub or shower regularly. Attach bath mats securely with double-sided non-slip rug tape. Do not have throw rugs and other things on the floor that can make you trip. What can I do in the bedroom? Use night lights. Make sure that you have a light by your bed that is easy to  reach. Do not use any sheets or blankets that are too big for your bed. They should not hang down onto the floor. Have a firm chair that has side arms. You can use this for support while you get dressed. Do not have throw rugs and other things on the floor that can make you trip. What can I do in the kitchen? Clean up any spills right away. Avoid walking on wet floors. Keep items that you use a lot in easy-to-reach places. If you need to reach something above you, use a strong step stool that has a grab bar. Keep electrical cords out of the way. Do not use floor polish or wax that makes floors slippery. If you must use wax, use non-skid floor wax. Do not have throw rugs and other things on the floor that can make you trip. What can I do with my stairs? Do not leave any items on the stairs. Make sure that there are handrails on both sides of the stairs and use them. Fix handrails that are broken or loose. Make sure that handrails are as long as the stairways. Check any carpeting to make sure that it is firmly attached to the stairs. Fix any carpet that is loose or worn. Avoid having throw rugs at the top or bottom of the stairs. If you do have throw rugs, attach them to the floor  with carpet tape. Make sure that you have a light switch at the top of the stairs and the bottom of the stairs. If you do not have them, ask someone to add them for you. What else can I do to help prevent falls? Wear shoes that: Do not have high heels. Have rubber bottoms. Are comfortable and fit you well. Are closed at the toe. Do not wear sandals. If you use a stepladder: Make sure that it is fully opened. Do not climb a closed stepladder. Make sure that both sides of the stepladder are locked into place. Ask someone to hold it for you, if possible. Clearly mark and make sure that you can see: Any grab bars or handrails. First and last steps. Where the edge of each step is. Use tools that help you move  around (mobility aids) if they are needed. These include: Canes. Walkers. Scooters. Crutches. Turn on the lights when you go into a dark area. Replace any light bulbs as soon as they burn out. Set up your furniture so you have a clear path. Avoid moving your furniture around. If any of your floors are uneven, fix them. If there are any pets around you, be aware of where they are. Review your medicines with your doctor. Some medicines can make you feel dizzy. This can increase your chance of falling. Ask your doctor what other things that you can do to help prevent falls. This information is not intended to replace advice given to you by your health care provider. Make sure you discuss any questions you have with your health care provider. Document Released: 01/08/2009 Document Revised: 08/20/2015 Document Reviewed: 04/18/2014 Elsevier Interactive Patient Education  2017 [employer].

## 2021-02-11 NOTE — Progress Notes (Signed)
Subjective:   Carla Hopkins is a 70 y.o. female who presents for Medicare Annual (Subsequent) preventive examination.  I connected with Carla Hopkins today by telephone and verified that I am speaking with the correct person using two identifiers. Location patient: home Location provider: work Persons participating in the virtual visit: patient, provider.   I discussed the limitations, risks, security and privacy concerns of performing an evaluation and management service by telephone and the availability of in person appointments. I also discussed with the patient that there may be a patient responsible charge related to this service. The patient expressed understanding and verbally consented to this telephonic visit.    Interactive audio and video telecommunications were attempted between this provider and patient, however failed, due to patient having technical difficulties OR patient did not have access to video capability.  We continued and completed visit with audio only.    Review of Systems     Cardiac Risk Factors include: advanced age (>92men, >70 women);dyslipidemia;hypertension     Objective:    Today's Vitals   There is no height or weight on file to calculate BMI.  Advanced Directives 02/11/2021 06/26/2018 11/03/2016 09/08/2016 08/24/2016 09/03/2015 02/26/2015  Does Patient Have a Medical Advance Directive? Yes Yes Yes Yes Yes Yes No  Type of Paramedic of [place];Living will [place];Living will Healthcare Power of [place];Living will Healthcare Power of [place] -  Does patient want to make changes to medical advance directive? - No - Patient declined - - No - Patient declined No - Patient declined -  Copy of [place] in Chart? No - copy requested No - copy requested No - copy requested - No - copy requested No - copy requested -  Would patient like  information on creating a medical advance directive? - - - - - - No - patient declined information    Current Medications (verified) Outpatient Encounter Medications as of 02/11/2021  Medication Sig   aspirin 81 MG chewable tablet Chew 1 tablet (81 mg total) by mouth daily. (Patient taking differently: Chew 325 mg by mouth daily.)   atorvastatin (LIPITOR) 80 MG tablet Take 1 tablet (80 mg total) by mouth daily.   levothyroxine (SYNTHROID) 88 MCG tablet TAKE 1 TABLET (88 MCG TOTAL) BY MOUTH DAILY BEFORE BREAKFAST.   Multiple Vitamin (MULTIVITAMIN) tablet Take 1 tablet by mouth daily.   MODERNA COVID-19 VACCINE 100 MCG/0.5ML injection    Facility-Administered Encounter Medications as of 02/11/2021  Medication   0.9 %  sodium chloride infusion    Allergies (verified) Docusate sodium   History: Past Medical History:  Diagnosis Date   Hypertension    Hypothyroidism    Stroke [facility])    Past Surgical History:  Procedure Laterality Date   ENDARTERECTOMY Right 08/05/2014   Procedure: Right Carotid ENDARTERECTOMY ;  Surgeon: Elam Dutch, MD;  [facility];  Service: Vascular;  Laterality: Right;   LEG SURGERY     PATCH ANGIOPLASTY Right 08/05/2014   Procedure: PATCH ANGIOPLASTY;  Surgeon: Elam Dutch, MD;  Location[facility];  Service: Vascular;  Laterality: Right;   TUBAL LIGATION     Family History  Adopted: Yes  Problem Relation Age of Onset   Colon cancer Neg Hx    Social History   Socioeconomic History   Marital status: Single    Spouse name: Not on file   Number of children: Not on file  Years of education: Not on file   Highest education level: Not on file  Occupational History   Not on file  Tobacco Use   Smoking status: Former    Packs/day: 20.00    Types: Cigarettes   Smokeless tobacco: Never   Tobacco comments:    quit smoking July 24, 2014  Substance and Sexual Activity   Alcohol use: Yes    Alcohol/week: 0.0 standard drinks    Comment: once  yearly   Drug use: No   Sexual activity: Not Currently  Other Topics Concern   Not on file  Social History Narrative   Not on file   Social Determinants of Health   Financial Resource Strain: Low Risk    Difficulty of Paying Living Expenses: Not hard at all  Food Insecurity: No Food Insecurity   Worried About Charity fundraiser in the Last Year: Never true   [place] in the Last Year: Never true  Transportation Needs: No Transportation Needs   Lack of Transportation (Medical): No   Lack of Transportation (Non-Medical): No  Physical Activity: Inactive   Days of Exercise per Week: 0 days   Minutes of Exercise per Session: 0 min  Stress: No Stress Concern Present   Feeling of Stress : Not at all  Social Connections: Socially Isolated   Frequency of Communication with Friends and Family: Twice a week   Frequency of Social Gatherings with Friends and Family: Twice a week   Attends Religious Services: Never   Marine scientist or Organizations: No   Attends Archivist Meetings: Never   Marital Status: Divorced    Tobacco Counseling Counseling given: Not Answered Tobacco comments: quit smoking July 24, 2014   Clinical Intake:  Pre-visit preparation completed: Yes  Pain : No/denies pain     Nutritional Risks: None Diabetes: No  How often do you need to have someone help you when you read instructions, pamphlets, or other written materials from your doctor or pharmacy?: 1 - Never What is the last grade level you completed in school?: [place]?: No  Information entered by :: l.Pansey Pinheiro,LPN   Activities of Daily Living In your present state of health, do you have any difficulty performing the following activities: 02/11/2021 12/31/2020  Hearing? N N  Vision? N N  Difficulty concentrating or making decisions? N N  Walking or climbing stairs? N N  Dressing or bathing? N N  Doing errands, shopping? N N  Preparing  Food and eating ? N -  Using the Toilet? N -  In the past six months, have you accidently leaked urine? N -  Do you have problems with loss of bowel control? N -  Managing your Medications? N -  Managing your Finances? N -  Housekeeping or managing your Housekeeping? N -  Some recent data might be hidden    Patient Care Team: Maximiano Coss, NP as PCP - General (Adult Health Nurse Practitioner)  Indicate any recent Medical Services you may have received from other than [facility] providers in the past year (date may be approximate).     Assessment:   This is a routine wellness examination for [place].  Hearing/Vision screen Vision Screening - Comments:: Annual eye exams   Dietary issues and exercise activities discussed: Current Exercise Habits: The patient does not participate in regular exercise at present, Exercise limited by: respiratory conditions(s);orthopedic condition(s)   Goals Addressed  This Visit's Progress    Increase physical activity   On track      Depression Screen PHQ 2/9 Scores 02/11/2021 02/11/2021 12/31/2020 01/08/2020 11/13/2018 06/26/2018 05/14/2018  PHQ - 2 Score 0 0 0 0 0 0 0  PHQ- 9 Score - - 0 - - - -    Fall Risk Fall Risk  02/11/2021 12/31/2020 01/08/2020 11/13/2018 06/26/2018  Falls in the past year? 0 0 0 0 0  Number falls in past yr: 0 0 0 0 0  Injury with Fall? 0 0 0 0 0  Risk for fall due to : - No Fall Risks - - -  Follow up Falls evaluation completed - Falls evaluation completed Falls evaluation completed -    FALL RISK PREVENTION PERTAINING TO THE HOME:  Any stairs in or around the home? No  If so, are there any without handrails? No  Home free of loose throw rugs in walkways, pet beds, electrical cords, etc? Yes  Adequate lighting in your home to reduce risk of falls? Yes   ASSISTIVE DEVICES UTILIZED TO PREVENT FALLS:  Life alert? No  Use of a cane, walker or w/c? Yes  Grab bars in the bathroom? Yes  Shower chair or  bench in shower? Yes  Elevated toilet seat or a handicapped toilet? Yes    Cognitive Function:  Normal cognitive status assessed by direct observation by this Nurse Health Advisor. No abnormalities found.     6CIT Screen 06/26/2018  What Year? 0 points  What month? 0 points  What time? 0 points  Count back from 20 0 points  Months in reverse 0 points  Repeat phrase 0 points  Total Score 0    Immunizations Immunization History  Administered Date(s) Administered   Moderna Sars-Covid-2 Vaccination 05/13/2019, 06/11/2019   Pneumococcal Conjugate-13 08/24/2016   Pneumococcal Polysaccharide-23 07/31/2014   Td 01/08/2020   Zoster Recombinat (Shingrix) 06/26/2018, 10/09/2018    TDAP status: Up to date  Flu Vaccine status: Up to date  Pneumococcal vaccine status: Up to date  Covid-19 vaccine status: Completed vaccines  Qualifies for Shingles Vaccine? Yes   Zostavax completed No   Shingrix Completed?: Yes  Screening Tests Health Maintenance  Topic Date Due   MAMMOGRAM  09/02/2018   Pneumonia Vaccine 4+ Years old (3 - PPSV23 if available, else PCV20) 07/31/2019   COVID-19 Vaccine (3 - Booster for Moderna series) 08/06/2019   INFLUENZA VACCINE  Never done   FOOT EXAM  03/27/2021 (Originally 05/15/2019)   HEMOGLOBIN A1C  03/27/2021 (Originally 07/08/2020)   OPHTHALMOLOGY EXAM  03/27/2021 (Originally 12/03/2015)   URINE MICROALBUMIN  03/27/2021 (Originally 05/15/2019)   DEXA SCAN  12/31/2021 (Originally 12/30/2014)   COLONOSCOPY (Pts 45-22yrs Insurance coverage will need to be confirmed)  11/17/2021   TETANUS/TDAP  01/07/2030   Hepatitis C Screening  Completed   Zoster Vaccines- Shingrix  Completed   HPV VACCINES  Aged Out    Health Maintenance  Health Maintenance Due  Topic Date Due   MAMMOGRAM  09/02/2018   Pneumonia Vaccine 28+ Years old (3 - PPSV23 if available, else PCV20) 07/31/2019   COVID-19 Vaccine (3 - Booster for Moderna series) 08/06/2019   INFLUENZA VACCINE   Never done    Colorectal cancer screening: Type of screening: Colonoscopy. Completed 11/17/2016. Repeat every 5 years  Mammogram status: Ordered patient declines . Pt provided with contact info and advised to call to schedule appt.   Bone Density status: Ordered patient declines . Pt provided with contact  info and advised to call to schedule appt.  Lung Cancer Screening: (Low Dose CT Chest recommended if Age 23-80 years, 30 pack-year currently smoking OR have quit w/in 15years.) does not qualify.   Lung Cancer Screening Referral: n/a  Additional Screening:  Hepatitis C Screening: does not qualify; Completed 05/14/2018  Vision Screening: Recommended annual ophthalmology exams for early detection of glaucoma and other disorders of the eye. Is the patient up to date with their annual eye exam?  Yes  Who is the provider or what is the name of the office in which the patient attends annual eye exams? Dr Sharen Counter  If pt is not established with a provider, would they like to be referred to a provider to establish care? No .   Dental Screening: Recommended annual dental exams for proper oral hygiene  Community Resource Referral / Chronic Care Management: CRR required this visit?  No   CCM required this visit?  No      Plan:     I have personally reviewed and noted the following in the patient's chart:   Medical and social history Use of alcohol, tobacco or illicit drugs  Current medications and supplements including opioid prescriptions.  Functional ability and status Nutritional status Physical activity Advanced directives List of other physicians Hospitalizations, surgeries, and ER visits in previous 12 months Vitals Screenings to include cognitive, depression, and falls Referrals and appointments  In addition, I have reviewed and discussed with patient certain preventive protocols, quality metrics, and best practice recommendations. A written personalized care plan for  preventive services as well as general preventive health recommendations were provided to patient.     Randel Pigg, LPN   15/18/3437   Nurse Notes: none

## 2021-02-15 ENCOUNTER — Ambulatory Visit: Payer: Medicare HMO

## 2021-04-13 ENCOUNTER — Other Ambulatory Visit: Payer: Self-pay | Admitting: Registered Nurse

## 2021-04-13 DIAGNOSIS — E034 Atrophy of thyroid (acquired): Secondary | ICD-10-CM

## 2021-04-13 DIAGNOSIS — E782 Mixed hyperlipidemia: Secondary | ICD-10-CM

## 2021-05-05 ENCOUNTER — Telehealth: Payer: Self-pay

## 2021-05-05 NOTE — Telephone Encounter (Signed)
Caller name:Norah Hollenbeck   On DPR? :Yes  Call back [phone]  Provider they see: Delfino Lovett  Reason for call:Pt is calling she wanted to know if Delfino Lovett will provide a letter for her to get out of jury duty because she is unable to walk very well

## 2021-05-06 NOTE — Telephone Encounter (Signed)
Erroneous encounter. Please disregard.

## 2021-05-10 ENCOUNTER — Encounter: Payer: Self-pay | Admitting: Registered Nurse

## 2021-05-10 NOTE — Telephone Encounter (Signed)
Sent via [employer]  [employer]

## 2021-05-10 NOTE — Telephone Encounter (Signed)
Patient is aware 

## 2021-06-15 ENCOUNTER — Other Ambulatory Visit: Payer: Self-pay | Admitting: Registered Nurse

## 2021-06-15 DIAGNOSIS — E034 Atrophy of thyroid (acquired): Secondary | ICD-10-CM

## 2021-06-15 DIAGNOSIS — E782 Mixed hyperlipidemia: Secondary | ICD-10-CM

## 2021-06-18 ENCOUNTER — Telehealth: Payer: Self-pay

## 2021-06-18 NOTE — Telephone Encounter (Signed)
Pt called on three way call with [facility] pt wanted to know why she could not get her medication for atorvastatin (LIPITOR) 80 MG tablet , levothyroxine (SYNTHROID) 88 MCG tablet  in notes it was told no more refills until blood work is done and she said she will stop taking medications because she is not coming to do routine blood work she has been on medication for years and no need for blood work.  ?

## 2021-06-21 NOTE — Telephone Encounter (Signed)
I have discussed with patient on multiple occasions that I would like to see her twice annually for blood work and in person visit. If she is not willing to do this, that's fine - but I cannot continue to provide medications without visits and blood work. ? ?Thanks, ? ?Rich

## 2021-06-21 NOTE — Telephone Encounter (Signed)
error 

## 2021-07-02 ENCOUNTER — Encounter: Payer: Self-pay | Admitting: Registered Nurse

## 2021-09-14 ENCOUNTER — Telehealth: Payer: Self-pay | Admitting: Registered Nurse

## 2021-09-14 NOTE — Telephone Encounter (Signed)
Error

## 2021-10-06 ENCOUNTER — Telehealth: Payer: Medicare HMO | Admitting: Registered Nurse

## 2021-11-02 ENCOUNTER — Encounter (HOSPITAL_COMMUNITY): Payer: Self-pay

## 2021-11-02 ENCOUNTER — Emergency Department (HOSPITAL_COMMUNITY): Payer: Medicare PPO

## 2021-11-02 ENCOUNTER — Other Ambulatory Visit: Payer: Self-pay

## 2021-11-02 ENCOUNTER — Observation Stay (HOSPITAL_COMMUNITY)
Admission: EM | Admit: 2021-11-02 | Discharge: 2021-11-03 | Disposition: A | Discharge status: Home Health | Payer: Medicare PPO | Attending: Internal Medicine | Admitting: Internal Medicine

## 2021-11-02 DIAGNOSIS — Z87891 Personal history of nicotine dependence: Secondary | ICD-10-CM | POA: Insufficient documentation

## 2021-11-02 DIAGNOSIS — R0902 Hypoxemia: Secondary | ICD-10-CM

## 2021-11-02 DIAGNOSIS — R9431 Abnormal electrocardiogram [ECG] [EKG]: Secondary | ICD-10-CM | POA: Diagnosis not present

## 2021-11-02 DIAGNOSIS — E785 Hyperlipidemia, unspecified: Secondary | ICD-10-CM | POA: Diagnosis present

## 2021-11-02 DIAGNOSIS — E876 Hypokalemia: Secondary | ICD-10-CM

## 2021-11-02 DIAGNOSIS — Z8673 Personal history of transient ischemic attack (TIA), and cerebral infarction without residual deficits: Secondary | ICD-10-CM | POA: Diagnosis not present

## 2021-11-02 DIAGNOSIS — I716 Thoracoabdominal aortic aneurysm, without rupture, unspecified: Secondary | ICD-10-CM

## 2021-11-02 DIAGNOSIS — D3502 Benign neoplasm of left adrenal gland: Secondary | ICD-10-CM | POA: Insufficient documentation

## 2021-11-02 DIAGNOSIS — Z95820 Peripheral vascular angioplasty status with implants and grafts: Secondary | ICD-10-CM | POA: Insufficient documentation

## 2021-11-02 DIAGNOSIS — K5900 Constipation, unspecified: Secondary | ICD-10-CM | POA: Insufficient documentation

## 2021-11-02 DIAGNOSIS — E039 Hypothyroidism, unspecified: Secondary | ICD-10-CM | POA: Diagnosis not present

## 2021-11-02 DIAGNOSIS — R0600 Dyspnea, unspecified: Secondary | ICD-10-CM | POA: Insufficient documentation

## 2021-11-02 DIAGNOSIS — K529 Noninfective gastroenteritis and colitis, unspecified: Secondary | ICD-10-CM | POA: Diagnosis present

## 2021-11-02 DIAGNOSIS — E1159 Type 2 diabetes mellitus with other circulatory complications: Secondary | ICD-10-CM

## 2021-11-02 DIAGNOSIS — J90 Pleural effusion, not elsewhere classified: Secondary | ICD-10-CM | POA: Diagnosis not present

## 2021-11-02 DIAGNOSIS — Z7982 Long term (current) use of aspirin: Secondary | ICD-10-CM | POA: Insufficient documentation

## 2021-11-02 DIAGNOSIS — E278 Other specified disorders of adrenal gland: Secondary | ICD-10-CM | POA: Diagnosis not present

## 2021-11-02 DIAGNOSIS — Z79899 Other long term (current) drug therapy: Secondary | ICD-10-CM | POA: Insufficient documentation

## 2021-11-02 DIAGNOSIS — E119 Type 2 diabetes mellitus without complications: Secondary | ICD-10-CM

## 2021-11-02 DIAGNOSIS — J9601 Acute respiratory failure with hypoxia: Secondary | ICD-10-CM | POA: Diagnosis not present

## 2021-11-02 DIAGNOSIS — K5641 Fecal impaction: Secondary | ICD-10-CM

## 2021-11-02 DIAGNOSIS — I10 Essential (primary) hypertension: Secondary | ICD-10-CM | POA: Diagnosis not present

## 2021-11-02 DIAGNOSIS — K6289 Other specified diseases of anus and rectum: Principal | ICD-10-CM | POA: Diagnosis present

## 2021-11-02 DIAGNOSIS — R29818 Other symptoms and signs involving the nervous system: Secondary | ICD-10-CM | POA: Insufficient documentation

## 2021-11-02 DIAGNOSIS — Z720 Tobacco use: Secondary | ICD-10-CM | POA: Diagnosis present

## 2021-11-02 DIAGNOSIS — I6930 Unspecified sequelae of cerebral infarction: Secondary | ICD-10-CM

## 2021-11-02 DIAGNOSIS — R1032 Left lower quadrant pain: Secondary | ICD-10-CM | POA: Diagnosis present

## 2021-11-02 LAB — COMPREHENSIVE METABOLIC PANEL
ALT: 15 U/L (ref 0–44)
AST: 22 U/L (ref 15–41)
Albumin: 3.2 g/dL — ABNORMAL LOW (ref 3.5–5.0)
Alkaline Phosphatase: 58 U/L (ref 38–126)
Anion gap: 10 (ref 5–15)
BUN: 15 mg/dL (ref 8–23)
CO2: 27 mmol/L (ref 22–32)
Calcium: 9.2 mg/dL (ref 8.9–10.3)
Chloride: 105 mmol/L (ref 98–111)
Creatinine, Ser: 1.17 mg/dL — ABNORMAL HIGH (ref 0.44–1.00)
GFR, Estimated: 50 mL/min — ABNORMAL LOW (ref >60)
Glucose, Bld: 196 mg/dL — ABNORMAL HIGH (ref 70–99)
Potassium: 2.4 mmol/L — CL (ref 3.5–5.1)
Sodium: 142 mmol/L (ref 135–145)
Total Bilirubin: 1 mg/dL (ref 0.3–1.2)
Total Protein: 6.3 g/dL — ABNORMAL LOW (ref 6.5–8.1)

## 2021-11-02 LAB — CBC
HCT: 45.3 % (ref 36.0–46.0)
Hemoglobin: 15.1 g/dL — ABNORMAL HIGH (ref 12.0–15.0)
MCH: 30.7 pg (ref 26.0–34.0)
MCHC: 33.3 g/dL (ref 30.0–36.0)
MCV: 92.1 fL (ref 80.0–100.0)
Platelets: 195 10*3/uL (ref 150–400)
RBC: 4.92 MIL/uL (ref 3.87–5.11)
RDW: 19.6 % — ABNORMAL HIGH (ref 11.5–15.5)
WBC: 11.1 10*3/uL — ABNORMAL HIGH (ref 4.0–10.5)
nRBC: 0 % (ref 0.0–0.2)

## 2021-11-02 LAB — RENAL FUNCTION PANEL
Albumin: 3.4 g/dL — ABNORMAL LOW (ref 3.5–5.0)
Anion gap: 7 (ref 5–15)
BUN: 15 mg/dL (ref 8–23)
CO2: 31 mmol/L (ref 22–32)
Calcium: 9.4 mg/dL (ref 8.9–10.3)
Chloride: 104 mmol/L (ref 98–111)
Creatinine, Ser: 1.14 mg/dL — ABNORMAL HIGH (ref 0.44–1.00)
GFR, Estimated: 51 mL/min — ABNORMAL LOW (ref >60)
Glucose, Bld: 95 mg/dL (ref 70–99)
Phosphorus: 2.7 mg/dL (ref 2.5–4.6)
Potassium: 3.4 mmol/L — ABNORMAL LOW (ref 3.5–5.1)
Sodium: 142 mmol/L (ref 135–145)

## 2021-11-02 LAB — BLOOD GAS, ARTERIAL
Acid-Base Excess: 5.8 mmol/L — ABNORMAL HIGH (ref 0.0–2.0)
Bicarbonate: 31.2 mmol/L — ABNORMAL HIGH (ref 20.0–28.0)
Drawn by: 560031
FIO2: 21 %
O2 Saturation: 92.9 %
Patient temperature: 37
pCO2 arterial: 47 mmHg (ref 32–48)
pH, Arterial: 7.43 (ref 7.35–7.45)
pO2, Arterial: 54 mmHg — ABNORMAL LOW (ref 83–108)

## 2021-11-02 LAB — LIPASE, BLOOD: Lipase: 26 U/L (ref 11–51)

## 2021-11-02 LAB — MAGNESIUM: Magnesium: 2.3 mg/dL (ref 1.7–2.4)

## 2021-11-02 LAB — GLUCOSE, CAPILLARY
Glucose-Capillary: 89 mg/dL (ref 70–99)
Glucose-Capillary: 90 mg/dL (ref 70–99)

## 2021-11-02 LAB — HEMOGLOBIN A1C
Hgb A1c MFr Bld: 5.3 % (ref 4.8–5.6)
Mean Plasma Glucose: 105.41 mg/dL

## 2021-11-02 LAB — CBG MONITORING, ED: Glucose-Capillary: 234 mg/dL — ABNORMAL HIGH (ref 70–99)

## 2021-11-02 LAB — BRAIN NATRIURETIC PEPTIDE: B Natriuretic Peptide: 344.8 pg/mL — ABNORMAL HIGH (ref 0.0–100.0)

## 2021-11-02 MED ORDER — SENNA 8.6 MG PO TABS
1.0000 | ORAL_TABLET | Freq: Two times a day (BID) | ORAL | Status: DC
  Administered 2021-11-03: 8.6 mg via ORAL
  Filled 2021-11-02 (×2): qty 1

## 2021-11-02 MED ORDER — ACETAMINOPHEN 325 MG PO TABS: 650.0000 mg | ORAL_TABLET | Freq: Four times a day (QID) | ORAL | Status: DC | PRN

## 2021-11-02 MED ORDER — POTASSIUM CHLORIDE 10 MEQ/100ML IV SOLN
10.0000 meq | Freq: Once | INTRAVENOUS | Status: AC
  Administered 2021-11-02: 10 meq via INTRAVENOUS
  Filled 2021-11-02: qty 100

## 2021-11-02 MED ORDER — HYDRALAZINE HCL 20 MG/ML IJ SOLN: 10.0000 mg | Freq: Three times a day (TID) | INTRAMUSCULAR | Status: DC | PRN

## 2021-11-02 MED ORDER — MAGNESIUM HYDROXIDE 400 MG/5ML PO SUSP: 30.0000 mL | Freq: Every day | ORAL | Status: DC | PRN

## 2021-11-02 MED ORDER — LOSARTAN POTASSIUM 25 MG PO TABS
25.0000 mg | ORAL_TABLET | Freq: Every day | ORAL | Status: DC
  Administered 2021-11-02 – 2021-11-03 (×2): 25 mg via ORAL
  Filled 2021-11-02 (×2): qty 1

## 2021-11-02 MED ORDER — INSULIN ASPART 100 UNIT/ML IJ SOLN: 0.0000 [IU] | Freq: Every day | INTRAMUSCULAR | Status: DC

## 2021-11-02 MED ORDER — METRONIDAZOLE 500 MG/100ML IV SOLN
500.0000 mg | Freq: Two times a day (BID) | INTRAVENOUS | Status: DC
  Administered 2021-11-02 – 2021-11-03 (×2): 500 mg via INTRAVENOUS
  Filled 2021-11-02 (×2): qty 100

## 2021-11-02 MED ORDER — INSULIN ASPART 100 UNIT/ML IJ SOLN: 0.0000 [IU] | Freq: Three times a day (TID) | INTRAMUSCULAR | Status: DC

## 2021-11-02 MED ORDER — POTASSIUM CHLORIDE 20 MEQ PO PACK
40.0000 meq | PACK | Freq: Two times a day (BID) | ORAL | Status: DC
  Administered 2021-11-02 – 2021-11-03 (×3): 40 meq via ORAL
  Filled 2021-11-02 (×3): qty 2

## 2021-11-02 MED ORDER — SODIUM CHLORIDE (PF) 0.9 % IJ SOLN
INTRAMUSCULAR | Status: AC
  Filled 2021-11-02: qty 50

## 2021-11-02 MED ORDER — ENOXAPARIN SODIUM 40 MG/0.4ML IJ SOSY
40.0000 mg | PREFILLED_SYRINGE | INTRAMUSCULAR | Status: DC
  Administered 2021-11-02: 40 mg via SUBCUTANEOUS
  Filled 2021-11-02: qty 0.4

## 2021-11-02 MED ORDER — SODIUM CHLORIDE 0.9 % IV SOLN
2.0000 g | INTRAVENOUS | Status: DC
  Administered 2021-11-02: 2 g via INTRAVENOUS
  Filled 2021-11-02 (×2): qty 20

## 2021-11-02 MED ORDER — FUROSEMIDE 10 MG/ML IJ SOLN
20.0000 mg | Freq: Once | INTRAMUSCULAR | Status: AC
  Administered 2021-11-02: 20 mg via INTRAVENOUS
  Filled 2021-11-02: qty 4

## 2021-11-02 MED ORDER — LEVOTHYROXINE SODIUM 88 MCG PO TABS
88.0000 ug | ORAL_TABLET | Freq: Every day | ORAL | Status: DC
  Administered 2021-11-03: 88 ug via ORAL
  Filled 2021-11-02: qty 1

## 2021-11-02 MED ORDER — IOHEXOL 350 MG/ML SOLN
100.0000 mL | Freq: Once | INTRAVENOUS | Status: AC | PRN
  Administered 2021-11-02: 100 mL via INTRAVENOUS

## 2021-11-02 MED ORDER — ACETAMINOPHEN 650 MG RE SUPP: 650.0000 mg | Freq: Four times a day (QID) | RECTAL | Status: DC | PRN

## 2021-11-02 MED ORDER — POTASSIUM CHLORIDE 10 MEQ/100ML IV SOLN
10.0000 meq | INTRAVENOUS | Status: AC
  Administered 2021-11-02 (×4): 10 meq via INTRAVENOUS
  Filled 2021-11-02 (×4): qty 100

## 2021-11-02 NOTE — ED Notes (Signed)
Pt having episodes of diarrhea now, no clean urine sample provided. Asked patient if an in and out cath could be performed. Pt declined

## 2021-11-02 NOTE — ED Notes (Signed)
RT at bedside to collect ABG. Pt currently on RA per provider's request for ABG

## 2021-11-02 NOTE — Consult Note (Signed)
NAME:  Carla Hopkins, MRN:  [id number], DOB:  [birth year], LOS: 0 ADMISSION DATE:  11/02/2021, CONSULTATION DATE: 11/02/2021 REFERRING MD: Dr. Marylyn Ishihara, - [facility] CHIEF COMPLAINT: Hypoxic respiratory failure  History of Present Illness:  Carla Hopkins is a 71 year old female with a past medical history significant for hypertension, hypothyroid, and prior stroke to the ED via EMS with complaints of abdominal pain and constipation.  On ED arrival patient was additionally seen to be hypoxic requiring application of 5 L nasal cannula.  Denies use of supplemental oxygen at baseline.  Hemodynamically stable on ED arrival.  Lab work significant for potassium 2.4,, glucose 196, creatinine 1.17, albumin 3.2, BNP 344.8, WBC 11.1. CT chest, ABD, and pelvis revealed multiple abnormalities see below. PCCM was consulted for assistance in management of hypoxia and possible intervention to loculated pleural effusion.   Pertinent  Medical History  Hypertension, hypothyroid, and prior stroke   Significant Hospital Events: Including procedures, antibiotic start and stop dates in addition to other pertinent events   8/8 presented to the ED for complaints of abdominal pain and constipation with multiple abnormalities on CT including left loculated pleural effusion CT chest 8/8 negative for PE but revealed reflux contrast into the IVC and hepatic veins suggestive of right heart dysfunction and dilated pulmonary artery, ascending thoracic aneurysm 4.1 cm, circumferential aortic thrombus, 4.7 abdominal aortic aneurysm and mild loculated left pleural effusion CT/ABD/pelvis: colitis/proctitis, thoracoabdominal aortic aneurysm with extensive intramural thrombus, 4.1 cm left adrenal mass suggesting myelolipoma.   Interim History / Subjective:  Seen lying in bed with no acute complaints   Objective   Blood pressure (!) 156/94, pulse 60, temperature 98 F (36.7 C), temperature source Oral, resp. rate 17, height 5' 2.5" (1.588 m),  weight 68 kg, SpO2 100 %.       No intake or output data in the 24 hours ending 11/02/21 1619 Filed Weights   11/02/21 0803  Weight: 68 kg    Examination: General: Acute on chronically ill appearing elderly female lying in bed, in NAD HEENT: [place]/AT, MM pink/moist, PERRL,  Neuro: Alert and oriented x3, left sided weakness with hx of CVA CV: s1s2 regular rate and rhythm, no murmur, rubs, or gallops,  PULM:  Clear to ascultation, no increased work of breathing, on 2L [place] GI: soft, bowel sounds active in all 4 quadrants, non-tender, non-distended, tolerating oral diet Extremities: warm/dry, no edema  Skin: no rashes or lesions  Resolved Hospital Problem list     Assessment & Plan:  Acute hypoxic respiratory failure -Patient seen hypoxic on ED arrival requiring application of supplemental oxygen, unable to wean in ED.  Patient does not use supplemental oxygen at baseline. Loculated left pleural effusion -Effusion on CT image very small and not likely safe amenable to thoracentesis CT evidence of likely elevated right heart pressures -Concern for underlying heart failure with preserved EF given dilated pulmonary artery on CT with IVC reflux Daily tobacco use  P: ECHO pending  Needs advanced heart failure consult in the outpatient setting  Continue supplemental oxygen, likely will need supplemental oxygen at discharge  Pleural effusion needs to be followed for expansion if progresses it needs to be taped for cultures  Diurese as able  Mobilize   Ascending thoracic aneurysm 4.1 cm Circumferential aortic thrombus  4.7 abdominal aortic aneurysm  Thoracoabdominal aortic aneurysm with extensive intramural thrombus P: EDP discussed patient with Dr. Doren Custard who recommended patient follow up with [facility] for vascular consult upon discharge   4.1 cm left  adrenal mass suggesting myelolipoma P: Need outpatient follow up with biopsy of mass   Best Practice (right click and "Reselect all  SmartList Selections" daily)  Per primary   Labs   CBC: Recent Labs  Lab 11/02/21 0825  WBC 11.1*  HGB 15.1*  HCT 45.3  MCV 92.1  PLT 597    Basic Metabolic Panel: Recent Labs  Lab 11/02/21 0825  NA 142  K 2.4*  CL 105  CO2 27  GLUCOSE 196*  BUN 15  CREATININE 1.17*  CALCIUM 9.2  MG 2.3   GFR: Estimated Creatinine Clearance: 40.4 mL/min (A) (by C-G formula based on SCr of 1.17 mg/dL (H)). Recent Labs  Lab 11/02/21 0825  WBC 11.1*    Liver Function Tests: Recent Labs  Lab 11/02/21 0825  AST 22  ALT 15  ALKPHOS 58  BILITOT 1.0  PROT 6.3*  ALBUMIN 3.2*   Recent Labs  Lab 11/02/21 0825  LIPASE 26   No results for input(s): "AMMONIA" in the last 168 hours.  ABG    Component Value Date/Time   PHART 7.43 11/02/2021 0855   PCO2ART 47 11/02/2021 0855   PO2ART 54 (L) 11/02/2021 0855   HCO3 31.2 (H) 11/02/2021 0855   TCO2 21 07/24/2014 1518   O2SAT 92.9 11/02/2021 0855     Coagulation Profile: No results for input(s): "INR", "PROTIME" in the last 168 hours.  Cardiac Enzymes: No results for input(s): "CKTOTAL", "CKMB", "CKMBINDEX", "TROPONINI" in the last 168 hours.  HbA1C: Hgb A1c MFr Bld  Date/Time Value Ref Range Status  01/08/2020 03:03 PM 6.0 (H) 4.8 - 5.6 % Final    Comment:             Prediabetes: 5.7 - 6.4          Diabetes: >6.4          Glycemic control for adults with diabetes: <7.0   11/08/2017 02:01 PM 5.7 (H) 4.8 - 5.6 % Final    Comment:             Prediabetes: 5.7 - 6.4          Diabetes: >6.4          Glycemic control for adults with diabetes: <7.0     CBG: Recent Labs  Lab 11/02/21 0804  GLUCAP 234*    Review of Systems:   Please see the history of present illness. All other systems reviewed and are negative   Past Medical History:  She,  has a past medical history of Hypertension, Hypothyroidism, and Stroke ([place]).   Surgical History:   Past Surgical History:  Procedure Laterality Date   ENDARTERECTOMY  Right 08/05/2014   Procedure: Right Carotid ENDARTERECTOMY ;  Surgeon: Elam Dutch, MD;  Location: [facility];  Service: Vascular;  Laterality: Right;   LEG SURGERY     PATCH ANGIOPLASTY Right 08/05/2014   Procedure: PATCH ANGIOPLASTY;  Surgeon: Elam Dutch, MD;  Location: [place];  Service: Vascular;  Laterality: Right;   TUBAL LIGATION       Social History:   reports that she has quit smoking. Her smoking use included cigarettes. She smoked an average of 20 packs per day. She has never used smokeless tobacco. She reports current alcohol use. She reports that she does not use drugs.   Family History:  Her family history is negative for Colon cancer. She was adopted.   Allergies Allergies  Allergen Reactions   Docusate Sodium Diarrhea     Home  Medications  Prior to Admission medications   Medication Sig Start Date End Date Taking? Authorizing Provider  acetaminophen (TYLENOL) 650 MG CR tablet Take 1,300 mg by mouth daily as needed for pain.   Yes [provider]  Calcium Carbonate Antacid (TUMS PO) Take 2 tablets by mouth daily as needed (upset stomach).   Yes [provider]  cholecalciferol (VITAMIN D3) 25 MCG (1000 UNIT) tablet Take 1,000 Units by mouth daily.   Yes [provider]  ibuprofen (ADVIL) 200 MG tablet Take 400 mg by mouth 2 (two) times daily as needed (leg pain).   Yes [provider]  levothyroxine (SYNTHROID) 88 MCG tablet TAKE 1 TABLET EVERY DAY BEFORE BREAKFAST (DUE FOR LABS BEFORE NEXT REFILL) Patient taking differently: Take 88 mcg by mouth daily before breakfast. 04/14/21  Yes Maximiano Coss, NP  Multiple Vitamin (MULTIVITAMIN) tablet Take 2 tablets by mouth daily.   Yes [provider]  aspirin 81 MG chewable tablet Chew 1 tablet (81 mg total) by mouth daily. Patient not taking: Reported on 11/02/2021 07/27/14   Lorna Few, DO  atorvastatin (LIPITOR) 80 MG tablet TAKE 1 TABLET (80 MG TOTAL) BY MOUTH  DAILY. Patient not taking: Reported on 11/02/2021 04/14/21   Maximiano Coss, NP  [facility] COVID-19 VACCINE 100 MCG/0.5ML injection  08/17/20   [provider]     Critical care time: NA  Dairon Procter D. Kenton Kingfisher, NP-C [facility] Personal contact information can be found on Amion  11/02/2021, 5:11 PM

## 2021-11-02 NOTE — H&P (Signed)
History and Physical    Patient: Carla Hopkins BLT:[id number] DOB: 03-31-1950 DOA: 11/02/2021 DOS: the patient was seen and examined on 11/02/2021 PCP: System, Provider Not In  Patient coming from: Home  Chief Complaint:  Chief Complaint  Patient presents with   Abdominal Pain   Constipation   HPI: Carla Hopkins is a 71 y.o. female with medical history significant of CVA w/ left side residuals, hypothyroidism, HLD, diet-controlled DM2, HTN, tobacco abuse. Presenting with LLQ abdominal pain. She notes that she has not had a BM in 2 days. She tried stool softeners and laxatives, but they did not help. She woke up this morning at 4am w/ sharp pain in her LLQ. It was non-radiating. She didn't have any fever, N/V. She became concerned and called for EMS. She denies any other aggravating or alleviating factors.   Review of Systems: As mentioned in the history of present illness. All other systems reviewed and are negative. Past Medical History:  Diagnosis Date   Hypertension    Hypothyroidism    Stroke [facility])    Past Surgical History:  Procedure Laterality Date   ENDARTERECTOMY Right 08/05/2014   Procedure: Right Carotid ENDARTERECTOMY ;  Surgeon: Elam Dutch, MD;  Location: [facility];  Service: Vascular;  Laterality: Right;   LEG SURGERY     PATCH ANGIOPLASTY Right 08/05/2014   Procedure: PATCH ANGIOPLASTY;  Surgeon: Elam Dutch, MD;  Location: [facility];  Service: Vascular;  Laterality: Right;   TUBAL LIGATION     Social History:  reports that she has quit smoking. Her smoking use included cigarettes. She smoked an average of 20 packs per day. She has never used smokeless tobacco. She reports current alcohol use. She reports that she does not use drugs.  Allergies  Allergen Reactions   Docusate Sodium Diarrhea    Family History  Adopted: Yes  Problem Relation Age of Onset   Colon cancer Neg Hx     Prior to Admission medications   Medication Sig Start Date End Date Taking?  Authorizing Provider  acetaminophen (TYLENOL) 650 MG CR tablet Take 1,300 mg by mouth daily as needed for pain.   Yes [provider]  Calcium Carbonate Antacid (TUMS PO) Take 2 tablets by mouth daily as needed (upset stomach).   Yes [provider]  cholecalciferol (VITAMIN D3) 25 MCG (1000 UNIT) tablet Take 1,000 Units by mouth daily.   Yes [provider]  ibuprofen (ADVIL) 200 MG tablet Take 400 mg by mouth 2 (two) times daily as needed (leg pain).   Yes [provider]  levothyroxine (SYNTHROID) 88 MCG tablet TAKE 1 TABLET EVERY DAY BEFORE BREAKFAST (DUE FOR LABS BEFORE NEXT REFILL) Patient taking differently: Take 88 mcg by mouth daily before breakfast. 04/14/21  Yes Maximiano Coss, NP  Multiple Vitamin (MULTIVITAMIN) tablet Take 2 tablets by mouth daily.   Yes [provider]  aspirin 81 MG chewable tablet Chew 1 tablet (81 mg total) by mouth daily. Patient not taking: Reported on 11/02/2021 07/27/14   Lorna Few, DO  atorvastatin (LIPITOR) 80 MG tablet TAKE 1 TABLET (80 MG TOTAL) BY MOUTH DAILY. Patient not taking: Reported on 11/02/2021 04/14/21   Maximiano Coss, NP  [facility] COVID-19 VACCINE 100 MCG/0.5ML injection  08/17/20   [provider]    Physical Exam: Vitals:   11/02/21 1154 11/02/21 1155 11/02/21 1200 11/02/21 1230  BP:  (!) 155/35 (!) 152/106 (!) 160/95  Pulse:  (!) 56 70 (!) 58  Resp:  $'17 19 14  'u$ Temp: 97.7 F (36.5 C)     TempSrc: Oral     SpO2:  94% 99% 95%  Weight:      Height:       General: 71 y.o. female resting in bed in NAD Eyes: PERRL, normal sclera ENMT: Nares patent w/o discharge, orophaynx clear, dentition normal, ears w/o discharge/lesions/ulcers Neck: Supple, trachea midline Cardiovascular: RRR, +S1, S2, 2/6 SEM, no g/r, equal pulses throughout Respiratory: CTABL, no w/r/r, normal WOB GI: BS+, NDNT, no masses noted, no organomegaly noted MSK: No e/c/c Neuro: A&O x 3, chronic weakness of  LUE Psyc: Appropriate interaction and affect, calm/cooperative  Data Reviewed:  Lab Results  Component Value Date   NA 142 11/02/2021   K 2.4 (LL) 11/02/2021   CO2 27 11/02/2021   GLUCOSE 196 (H) 11/02/2021   BUN 15 11/02/2021   CREATININE 1.17 (H) 11/02/2021   CALCIUM 9.2 11/02/2021   GFRNONAA 50 (L) 11/02/2021   Lab Results  Component Value Date   ALT 15 11/02/2021   AST 22 11/02/2021   ALKPHOS 58 11/02/2021   BILITOT 1.0 11/02/2021   Lab Results  Component Value Date   WBC 11.1 (H) 11/02/2021   HGB 15.1 (H) 11/02/2021   HCT 45.3 11/02/2021   MCV 92.1 11/02/2021   PLT 195 11/02/2021   CTA chest PE IMPRESSION: 1. No filling defects seen in the pulmonary arteries up to the segmental branches. Pulmonary artery measures 3.2 cm in greatest dimension reflecting pulmonary hypertension. There is reflux of the contrast seen into the IVC and hepatic veins of likely right heart dysfunction. 2.  Ascending thoracic aorta measures 4.1 cm in greatest dimension. 3.  Aortic atherosclerosis (ICD10-I70.0) 4. Aorta at the hiatus measures 4.7 cm in greatest dimension with semi circumferential thrombus anteriorly. 4.7 cm abdominal aortic aneurysm.Recommend follow-up every 6 months and vascular consultation.Reference: J Am Coll Radiol 0626;94:854-627. 5. There is mild loculated pleural effusion seen in the left thorax extending up to the upper-mid thorax. There is loculated pleural effusion component along the distal descending thoracic aorta as above. Bibasal pleural thickening greater on the left. Mild left basilar atelectasis. 6. Moderate thoracic spondylosis and is most prominent at T12-L1 as above.  CT ab/pelvis w/ con Findings consistent with rectosigmoid colitis/proctitis. No focal fluid collection. Colonic diverticulosis.  Cholelithiasis. Thoracoabdominal aortic aneurysm, measuring up to 4.8 cm at the aortic hiatus and just below the renal arteries. Extensive  mural thrombus with areas of ulceration and up to 50% stenosis of the infrarenal aorta. Focal saccular outpouching adjacent to the takeoff of the celiac trunk and SMA with a 1.4 cm neck. Recommend vascular consultation and follow-up CT/MR every 6 months.This recommendation follows ACR consensus guidelines: White Paper of the ACR Incidental Findings Committee II on Vascular Findings. J Am Coll Radiol 2013; 10:789-794. 4.1 cm left adrenal mass with small area of internal macroscopic fat suggesting myelolipoma. Surgical consultation is recommended as myelolipomas > 4 cm are at an increased risk for hemorrhage and rupture. Consider biochemical lab evaluation for functional status and pheochromocytoma prior to resection. JACR 2017 Aug; 14(8):1038-44, JCAT 2016 Mar-Apr; 40(2):194-200, Urol J 2006 Spring; 3(2):71-4.  EKG: sinus, no st elevation; prolong QT  Assessment and Plan: Fecal impaction Rectosigmoid proctitis     - place in obs, tele     - she has been disimpacted and is feeling better     - will start rocephin, flagyl     - continue BM regimen  Acute respiratory failure  w/ hypoxia     - multifactorial     - she has a loculated effusion; pulm consulted, appreciate assistance     - she has an elevated BNP and some evidence of pHTN; no peripheral edema and pulm edema noted; will check echo     - denies chest pain and ekg is ok  Hypokalemia Prolonged QT     - replace K+, check Mg2+; check renal function panel at 1700hrs to assess progress     - avoid QT prolonging meds  AAA     - EDP spoke with thoracic surgery (Dr. Doren Custard); can follow up outpt w/ Dr. Doren Custard w/ ultimate referral to [facility]  Left adrenal mass     - as per CT ab/pelvis; c/w myelolipoma     - spoke with gen surg; recommend outpt follow up with them  HTN     - not on meds at home; PRNs available; start cozaar  DM2     - diet-controlled     - A1c, SSI, glucose checks, DM diet  Hypothyroidism     - continue home  regimen  Tobacco abuse     - counsel against further use  HLD Hx of CVA w/ left side residuals     - she is no longer taking ASA and statin at home  Advance Care Planning:   Code Status: FULL  Consults: EDP spoke with thoracic surgery; I sidebarred general surgery; PCCM onboard.  Family Communication: None at bedside  Severity of Illness: The appropriate patient status for this patient is INPATIENT. Inpatient status is judged to be reasonable and necessary in order to provide the required intensity of service to ensure the patient's safety. The patient's presenting symptoms, physical exam findings, and initial radiographic and laboratory data in the context of their chronic comorbidities is felt to place them at high risk for further clinical deterioration. Furthermore, it is not anticipated that the patient will be medically stable for discharge from the hospital within 2 midnights of admission.   * I certify that at the point of admission it is my clinical judgment that the patient will require inpatient hospital care spanning beyond 2 midnights from the point of admission due to high intensity of service, high risk for further deterioration and high frequency of surveillance required.*  Author: Jonnie Finner, DO 11/02/2021 1:24 PM  For on call review [email or web address].

## 2021-11-02 NOTE — ED Notes (Signed)
Pt had episode of diarrhea- changed pads, gown, purewick removed

## 2021-11-02 NOTE — ED Provider Notes (Signed)
[facility] DEPT Provider Note   CSN: [id number] Arrival date & time: 11/02/21  0754     History  Chief Complaint  Patient presents with   Abdominal Pain   Constipation    Carla Hopkins is a 71 y.o. female.  HPI 71 year old female history of stroke with residual left upper extremity paralysis and some left lower extremity weakness.  She presents today complaining of lower abdominal pain and inability to have a bowel movement for several days.  She also has had some decreased ability to urinate.  She denies fever, chills, dyspnea, chest pain.  Of note, patient had new oxygen requirement and is on 5 L initial evaluation with sats at 93% she denies any change in her dyspnea.  She is a smoker and continues to smoke.     Home Medications Prior to Admission medications   Medication Sig Start Date End Date Taking? Authorizing Provider  aspirin 81 MG chewable tablet Chew 1 tablet (81 mg total) by mouth daily. Patient taking differently: Chew 325 mg by mouth daily. 07/27/14   Rumley, Dundee N, DO  atorvastatin (LIPITOR) 80 MG tablet TAKE 1 TABLET (80 MG TOTAL) BY MOUTH DAILY. 04/14/21   Maximiano Coss, NP  levothyroxine (SYNTHROID) 88 MCG tablet TAKE 1 TABLET EVERY DAY BEFORE BREAKFAST (DUE FOR LABS BEFORE NEXT REFILL) 04/14/21   Maximiano Coss, NP  [facility] COVID-19 VACCINE 100 MCG/0.5ML injection  08/17/20   [provider]  Multiple Vitamin (MULTIVITAMIN) tablet Take 1 tablet by mouth daily.    [provider]      Allergies    Docusate sodium    Review of Systems   Review of Systems  Physical Exam Updated Vital Signs BP (!) 155/35   Pulse (!) 56   Temp 97.7 F (36.5 C) (Oral)   Resp 17   Ht 1.588 m (5' 2.5")   Wt 68 kg   SpO2 94%   BMI 27.00 kg/m  Physical Exam Vitals and nursing note reviewed.  Constitutional:      Appearance: She is well-developed.     Comments: Patient appears chronically ill  HENT:     Head:  Normocephalic.  Eyes:     Extraocular Movements: Extraocular movements intact.  Cardiovascular:     Rate and Rhythm: Normal rate and regular rhythm.  Pulmonary:     Effort: Pulmonary effort is normal.     Breath sounds: Rhonchi present.     Comments: Diffuse rhonchi noted Abdominal:     General: Abdomen is flat. Bowel sounds are normal.     Palpations: Abdomen is soft.     Tenderness: There is abdominal tenderness in the right lower quadrant, suprapubic area and left lower quadrant.  Genitourinary:    Comments: Rectal exam performed Rectum with large amount of stool protruding Digital disimpaction done Skin:    General: Skin is warm and dry.     Capillary Refill: Capillary refill takes less than 2 seconds.  Neurological:     Mental Status: She is alert and oriented to person, place, and time.     Comments: Left upper extremity with contractures Patient able to move left lower extremity against gravity  Psychiatric:        Mood and Affect: Mood normal.     ED Results / Procedures / Treatments   Labs (all labs ordered are listed, but only abnormal results are displayed) Labs Reviewed  COMPREHENSIVE METABOLIC PANEL - Abnormal; Notable for the following components:  Result Value   Potassium 2.4 (*)    Glucose, Bld 196 (*)    Creatinine, Ser 1.17 (*)    Total Protein 6.3 (*)    Albumin 3.2 (*)    GFR, Estimated 50 (*)    All other components within normal limits  CBC - Abnormal; Notable for the following components:   WBC 11.1 (*)    Hemoglobin 15.1 (*)    RDW 19.6 (*)    All other components within normal limits  BLOOD GAS, ARTERIAL - Abnormal; Notable for the following components:   pO2, Arterial 54 (*)    Bicarbonate 31.2 (*)    Acid-Base Excess 5.8 (*)    All other components within normal limits  BRAIN NATRIURETIC PEPTIDE - Abnormal; Notable for the following components:   B Natriuretic Peptide 344.8 (*)    All other components within normal limits  CBG  MONITORING, ED - Abnormal; Notable for the following components:   Glucose-Capillary 234 (*)    All other components within normal limits  LIPASE, BLOOD  URINALYSIS, ROUTINE W REFLEX MICROSCOPIC    EKG EKG Interpretation  Date/Time:  Tuesday November 02 2021 08:22:54 EDT Ventricular Rate:  62 PR Interval:  223 QRS Duration: 123 QT Interval:  505 QTC Calculation: 513 R Axis:   30 Text Interpretation: Sinus rhythm Prolonged PR interval Probable left atrial enlargement Left bundle branch block Confirmed by Pattricia Boss [phone]) on 11/02/2021 8:50:58 AM  Radiology CT ABDOMEN PELVIS W CONTRAST  Result Date: 11/02/2021 CLINICAL DATA:  Abdominal pain, acute, nonlocalized EXAM: CT ABDOMEN AND PELVIS WITH CONTRAST TECHNIQUE: Multidetector CT imaging of the abdomen and pelvis was performed using the standard protocol following bolus administration of intravenous contrast. RADIATION DOSE REDUCTION: This exam was performed according to the departmental dose-optimization program which includes automated exposure control, adjustment of the mA and/or kV according to patient size and/or use of iterative reconstruction technique. CONTRAST:  182m OMNIPAQUE IOHEXOL 350 MG/ML SOLN COMPARISON:  None Available. FINDINGS: Lower chest: Cardiomegaly. Aneurysmal dilation of the descending thoracic aorta at the aortic hiatus measuring up to 4.8 cm (series 2, image 9). Trace effusions with bibasilar atelectasis, left greater than right. Hepatobiliary: No focal liver abnormality is seen. Cholelithiasis. No acute cholecystitis. Pancreas: Unremarkable. No pancreatic ductal dilatation or surrounding inflammatory changes. Spleen: Normal in size.  No focal abnormality. Adrenals/Urinary Tract: There is a 4.1 x 3.9 x 2.6 cm left adrenal mass with small area of internal macroscopic fat (series 2 image 15, series 6 image 92). Mild prominence of the renal collecting systems due to a markedly distended urinary bladder. Atrophic right  kidney. Punctate nonobstructive right upper pole renal stone. Stomach/Bowel: Tiny hiatal hernia. The stomach is otherwise within normal limits. There is no evidence of bowel obstruction.The appendix is normal. Diverticulosis of the descending and proximal sigmoid colon. There is long segment submucosal edema of the rectosigmoid colon. Adjacent mild stranding, worst along the rectum and presacral soft tissues. Vascular/Lymphatic: Tortuous abdominal aorta with aneurysmal dilation of the infrarenal aorta measuring up to 4.8 x 4.6 cm just below the renal arteries (series 2, image 23), up to 4.3 x 4.1 cm in the mid infrarenal aorta (series 2, image 31, and up to 4.2 cm just above the aortic bifurcation into the iliacs (series 2, image 38). There is a focal aneurysmal outpouching adjacent to the takeoff of the celiac trunk and SMA on the right in the upper abdomen with a 1.4 cm neck (series 2, image 17). There is extensive  mural thrombus throughout the abdominal aorta, with areas of ulceration (series 2, images 22-23, and with up to 50% stenosis of the infrarenal aorta. Reproductive: Unremarkable. Other: There is mild body wall edema. No bowel containing hernia. Small fat containing inguinal hernias. Trace free fluid in pelvis. No intraperitoneal free gas. Musculoskeletal: Prior left proximal femur fixation with adjacent heterotopic ossification. There is no evidence of acute osseous abnormality or suspicious osseous lesion. There are multilevel degenerative changes of the spine. IMPRESSION: Findings consistent with rectosigmoid colitis/proctitis. No focal fluid collection. Colonic diverticulosis.  Cholelithiasis. Thoracoabdominal aortic aneurysm, measuring up to 4.8 cm at the aortic hiatus and just below the renal arteries. Extensive mural thrombus with areas of ulceration and up to 50% stenosis of the infrarenal aorta. Focal saccular outpouching adjacent to the takeoff of the celiac trunk and SMA with a 1.4 cm neck.  Recommend vascular consultation and follow-up CT/MR every 6 months.This recommendation follows ACR consensus guidelines: White Paper of the ACR Incidental Findings Committee II on Vascular Findings. J Am Coll Radiol 2013; 10:789-794. 4.1 cm left adrenal mass with small area of internal macroscopic fat suggesting myelolipoma. Surgical consultation is recommended as myelolipomas > 4 cm are at an increased risk for hemorrhage and rupture. Consider biochemical lab evaluation for functional status and pheochromocytoma prior to resection. JACR 2017 Aug; 14(8):1038-44, JCAT 2016 Mar-Apr; 40(2):194-200, Urol J 2006 Spring; 3(2):71-4. Electronically Signed   By: Maurine Simmering M.D.   On: 11/02/2021 11:50   CT Angio Chest Pulmonary Embolism (PE) W or WO Contrast  Result Date: 11/02/2021 CLINICAL DATA:  Chest pain and shortness of breath, pleurisy, effusion suspected. EXAM: CT ANGIOGRAPHY CHEST WITH CONTRAST TECHNIQUE: Multidetector CT imaging of the chest was performed using the standard protocol during bolus administration of intravenous contrast. Multiplanar CT image reconstructions and MIPs were obtained to evaluate the vascular anatomy. RADIATION DOSE REDUCTION: This exam was performed according to the departmental dose-optimization program which includes automated exposure control, adjustment of the mA and/or kV according to patient size and/or use of iterative reconstruction technique. CONTRAST:  183m OMNIPAQUE IOHEXOL 350 MG/ML SOLN COMPARISON:  July 24, 2014 FINDINGS: Cardiovascular: Satisfactory opacification of the pulmonary arteries to the segmental level. No evidence of pulmonary embolism. Main pulmonary artery measures 3.2 cm and is dilated. There is some reflux of the contrast seen into the IVC and then the hepatic veins. Ascending thoracic aorta measures 4.1 cm in greatest dimension. Moderate atheromatous calcifications at the arch and the root of the aorta. Mild cardiomegaly. Mild atheromatous calcifications  of the coronary arteries. No pericardial effusion. Mediastinum/Nodes: No enlarged mediastinal, hilar, or axillary lymph nodes. Thyroid gland, trachea, and esophagus demonstrate no significant findings. Lungs/Pleura: There is no consolidation, discrete pulmonary nodule or pneumothorax seen. Mild centrilobular emphysema in the upper lungs. There is mild loculated pleural effusion seen at the left side. The loculated effusion extends up to the upper-mid thorax. There is loculated pleural effusion component along the distal descending thoracic aorta (images 29 through 50 of series 4). There is some bibasilar pleural thickening seen. Mild left basal atelectasis. Upper Abdomen: There is aneurysmal dilatation of the aorta seen at the aortic hiatus measuring 4.7 cm in greatest dimension with semi circumferential thrombus anteriorly. Musculoskeletal: Moderate thoracic spondylosis with prominent endplate osteophytes, endplate sclerosis and loss of the disc space/vacuum disc phenomenon and is most prominent at T12-L1. Minimal retrolisthesis at T12-L1 as well. Review of the MIP images confirms the above findings. IMPRESSION: 1. No filling defects seen in the pulmonary arteries  up to the segmental branches. Pulmonary artery measures 3.2 cm in greatest dimension reflecting pulmonary hypertension. There is reflux of the contrast seen into the IVC and hepatic veins of likely right heart dysfunction. 2.  Ascending thoracic aorta measures 4.1 cm in greatest dimension. 3.  Aortic atherosclerosis (ICD10-I70.0) 4. Aorta at the hiatus measures 4.7 cm in greatest dimension with semi circumferential thrombus anteriorly. 4.7 cm abdominal aortic aneurysm.Recommend follow-up every 6 months and vascular consultation.Reference: J Am Coll Radiol 7622;63:335-456. 5. There is mild loculated pleural effusion seen in the left thorax extending up to the upper-mid thorax. There is loculated pleural effusion component along the distal descending  thoracic aorta as above. Bibasal pleural thickening greater on the left. Mild left basilar atelectasis. 6. Moderate thoracic spondylosis and is most prominent at T12-L1 as above. Electronically Signed   By: Frazier Richards M.D.   On: 11/02/2021 11:49   DG Chest Port 1 View  Result Date: 11/02/2021 CLINICAL DATA:  Dyspnea EXAM: PORTABLE CHEST 1 VIEW COMPARISON:  08/05/2014 FINDINGS: Cardiomegaly. Aortic atherosclerosis. No focal airspace consolidation, pleural effusion, or pneumothorax. IMPRESSION: No active disease. Electronically Signed   By: Davina Poke D.O.   On: 11/02/2021 09:48    Procedures Procedures    Medications Ordered in ED Medications  potassium chloride (KLOR-CON) packet 40 mEq (40 mEq Oral Given 11/02/21 1003)  sodium chloride (PF) 0.9 % injection (has no administration in time range)  potassium chloride 10 mEq in 100 mL IVPB (0 mEq Intravenous Stopped 11/02/21 1125)  furosemide (LASIX) injection 20 mg (20 mg Intravenous Given 11/02/21 1032)  iohexol (OMNIPAQUE) 350 MG/ML injection 100 mL (100 mLs Intravenous Contrast Given 11/02/21 1057)    ED Course/ Medical Decision Making/ A&P Clinical Course as of 11/02/21 1245  Tue Nov 02, 2021  1202 Comprehensive metabolic panel(!!) Complete metabolic panel reviewed and interpreted with hypokalemia of 2.4 noted Oral repletion and IV repletion ensuing  [DR]  1202 Blood gas, arterial (at [facility])(!) ABG reviewed and interpreted and significant for hypoxia with PO2 at 54 [DR]  1208 CT abdomen pelvis consistent with rectosigmoid colitis/proctitis no fluid collection Of note there is a thoracoabdominal aortic aneurysm measured 4.8 cm at the aortic aortic hiatus with extensive mural thrombus with areas of ulceration up to be different stenosis of the infrarenal aorta 4.1 cm left adrenal mass [DR]    Clinical Course User Index [DR] Pattricia Boss, MD                           Medical Decision Making 71 year old female history of COPD, on  oxygen, presents today with constipation and rectal pain.  1 abdominal pain/constipation patient reports taking over-the-counter laxatives On her exam she has fecal impaction which have been digitally disimpacted She also has bilateral lower abdominal tenderness palpation Plan CT and evaluation of urine 2 new hypoxia patient with oxygen sats dropped to 84% with oxygen removal.  Lung exam shows diffuse rhonchi No wheezing is noted Chest x-[facility] are pending Echocardiogram 06/05/2018:  Left ventricle cavity is normal in size. Severe asymmetric hypertrophy of  the left ventricle, interventricular septum measuring 2.0 cm. Normal  global wall motion. Visual EF is 55-60%. Doppler evidence of grade I  (impaired) diastolic dysfunction, normal LAP. Calculated EF 52%.  Left atrial cavity is mildly dilated.  Mild aortic valve leaflet thickening. Significant gradient, largely from  LVOT, than aortic valve. LVOT max gradient not adequately measured. Mean  PG 37 mmHg.  Vmax 4.2 m/sec. AVA likely underestimated in the setting of  LVOT gradient. At best, mild aortic stenosis.  Moderate (Grade II) mitral regurgitation. Mild calcification of the mitral  valve annulus.  Mild tricuspid regurgitation. Estimated pulmonary artery systolic pressure  29 mmHg.  Gradients and LV thickness higher, compared to previous study in 2016.  3- electrolytes returned with potassium at 2.4- oral and iv replenishment ensuing 4- thoracoabdominal aneurysm noted discussed with Dr. Doren Custard, on-call for thoracic surgery.  He advises that these are cared for only at [facility].  This does not appear to be symptomatic an incidental finding.  She can follow-up with Dr. Doren Custard in the office.  He does not think any acute intervention is needed at this time 5 loculated pleural effusion in the left thorax  Discussed with Dr. Marylyn Ishihara who will see for admission 6 CT consistent with rectosigmoid colitis/proctitis  Amount and/or  Complexity of Data Reviewed Labs: ordered. Decision-making details documented in ED Course. Radiology: ordered and independent interpretation performed. Decision-making details documented in ED Course. ECG/medicine tests: ordered and independent interpretation performed. Decision-making details documented in ED Course.    Details: Patient maintained on monitor EKG reviewed interpreted Discussion of management or test interpretation with external provider(s): Discussed care with Dr. Doren Custard, on for vascular surgery Discussed care with Dr. Marylyn Ishihara, on-call for hospitalist  Risk Prescription drug management. Parenteral controlled substances. Decision regarding hospitalization.  Critical Care Total time providing critical care: 60 minutes           Final Clinical Impression(s) / ED Diagnoses Final diagnoses:  Constipation, unspecified constipation type  Hypokalemia  Hypoxia  Pleural effusion  Thoracoabdominal aortic aneurysm (TAAA) without rupture, unspecified part [facility])    Rx / DC Orders ED Discharge Orders     None         Pattricia Boss, MD 11/02/21 1246

## 2021-11-02 NOTE — ED Triage Notes (Signed)
PT arrives via EMS from home. Pt c/o abdominal pain and constipation for the last couple of days.

## 2021-11-03 ENCOUNTER — Inpatient Hospital Stay (HOSPITAL_COMMUNITY): Payer: Medicare PPO

## 2021-11-03 ENCOUNTER — Observation Stay (HOSPITAL_BASED_OUTPATIENT_CLINIC_OR_DEPARTMENT_OTHER): Payer: Medicare PPO

## 2021-11-03 ENCOUNTER — Telehealth: Payer: Self-pay | Admitting: Critical Care Medicine

## 2021-11-03 DIAGNOSIS — E782 Mixed hyperlipidemia: Secondary | ICD-10-CM

## 2021-11-03 DIAGNOSIS — I5031 Acute diastolic (congestive) heart failure: Secondary | ICD-10-CM | POA: Diagnosis not present

## 2021-11-03 DIAGNOSIS — I716 Thoracoabdominal aortic aneurysm, without rupture, unspecified: Secondary | ICD-10-CM

## 2021-11-03 DIAGNOSIS — E034 Atrophy of thyroid (acquired): Secondary | ICD-10-CM

## 2021-11-03 DIAGNOSIS — E278 Other specified disorders of adrenal gland: Secondary | ICD-10-CM | POA: Diagnosis not present

## 2021-11-03 DIAGNOSIS — I10 Essential (primary) hypertension: Secondary | ICD-10-CM

## 2021-11-03 DIAGNOSIS — K529 Noninfective gastroenteritis and colitis, unspecified: Secondary | ICD-10-CM | POA: Diagnosis present

## 2021-11-03 DIAGNOSIS — I6930 Unspecified sequelae of cerebral infarction: Secondary | ICD-10-CM

## 2021-11-03 DIAGNOSIS — J9601 Acute respiratory failure with hypoxia: Secondary | ICD-10-CM | POA: Diagnosis not present

## 2021-11-03 DIAGNOSIS — K5641 Fecal impaction: Secondary | ICD-10-CM

## 2021-11-03 DIAGNOSIS — K6289 Other specified diseases of anus and rectum: Secondary | ICD-10-CM | POA: Diagnosis not present

## 2021-11-03 DIAGNOSIS — Z720 Tobacco use: Secondary | ICD-10-CM

## 2021-11-03 LAB — COMPREHENSIVE METABOLIC PANEL
ALT: 14 U/L (ref 0–44)
AST: 26 U/L (ref 15–41)
Albumin: 2.9 g/dL — ABNORMAL LOW (ref 3.5–5.0)
Alkaline Phosphatase: 54 U/L (ref 38–126)
Anion gap: 7 (ref 5–15)
BUN: 13 mg/dL (ref 8–23)
CO2: 29 mmol/L (ref 22–32)
Calcium: 8.7 mg/dL — ABNORMAL LOW (ref 8.9–10.3)
Chloride: 106 mmol/L (ref 98–111)
Creatinine, Ser: 1.08 mg/dL — ABNORMAL HIGH (ref 0.44–1.00)
GFR, Estimated: 55 mL/min — ABNORMAL LOW (ref >60)
Glucose, Bld: 85 mg/dL (ref 70–99)
Potassium: 3.5 mmol/L (ref 3.5–5.1)
Sodium: 142 mmol/L (ref 135–145)
Total Bilirubin: 0.6 mg/dL (ref 0.3–1.2)
Total Protein: 5.8 g/dL — ABNORMAL LOW (ref 6.5–8.1)

## 2021-11-03 LAB — ECHOCARDIOGRAM COMPLETE
AR max vel: 2.3 cm2
AV Area VTI: 2.38 cm2
AV Area mean vel: 2.48 cm2
AV Mean grad: 7 mmHg
AV Peak grad: 14.3 mmHg
Ao pk vel: 1.89 m/s
Area-P 1/2: 2.03 cm2
Height: 62.5 in
S' Lateral: 3.3 cm
Weight: 2400 oz

## 2021-11-03 LAB — GLUCOSE, CAPILLARY
Glucose-Capillary: 99 mg/dL (ref 70–99)
Glucose-Capillary: 117 mg/dL — ABNORMAL HIGH (ref 70–99)
Glucose-Capillary: 115 mg/dL — ABNORMAL HIGH (ref 70–99)
Glucose-Capillary: 148 mg/dL — ABNORMAL HIGH (ref 70–99)

## 2021-11-03 LAB — CBC
HCT: 44.3 % (ref 36.0–46.0)
Hemoglobin: 14.3 g/dL (ref 12.0–15.0)
MCH: 30 pg (ref 26.0–34.0)
MCHC: 32.3 g/dL (ref 30.0–36.0)
MCV: 93.1 fL (ref 80.0–100.0)
Platelets: 183 10*3/uL (ref 150–400)
RBC: 4.76 MIL/uL (ref 3.87–5.11)
RDW: 19.5 % — ABNORMAL HIGH (ref 11.5–15.5)
WBC: 8 10*3/uL (ref 4.0–10.5)
nRBC: 0 % (ref 0.0–0.2)

## 2021-11-03 LAB — PHOSPHORUS: Phosphorus: 2.5 mg/dL (ref 2.5–4.6)

## 2021-11-03 LAB — MAGNESIUM: Magnesium: 2.2 mg/dL (ref 1.7–2.4)

## 2021-11-03 MED ORDER — AMOXICILLIN-POT CLAVULANATE 875-125 MG PO TABS
1.0000 | ORAL_TABLET | Freq: Two times a day (BID) | ORAL | Status: DC
  Filled 2021-11-03: qty 1

## 2021-11-03 MED ORDER — POTASSIUM CHLORIDE CRYS ER 20 MEQ PO TBCR
20.0000 meq | EXTENDED_RELEASE_TABLET | Freq: Every day | ORAL | 0 refills | Status: AC
Start: 2021-11-03 — End: ?

## 2021-11-03 MED ORDER — ACETAMINOPHEN 325 MG PO TABS: 650.0000 mg | ORAL_TABLET | Freq: Four times a day (QID) | ORAL | 0 refills | Status: AC | PRN

## 2021-11-03 MED ORDER — POTASSIUM CHLORIDE CRYS ER 20 MEQ PO TBCR: 40.0000 meq | EXTENDED_RELEASE_TABLET | Freq: Two times a day (BID) | ORAL | Status: DC

## 2021-11-03 MED ORDER — SENNA 8.6 MG PO TABS: 1.0000 | ORAL_TABLET | Freq: Two times a day (BID) | ORAL | 0 refills | Status: AC

## 2021-11-03 MED ORDER — AMOXICILLIN-POT CLAVULANATE 875-125 MG PO TABS: 1.0000 | ORAL_TABLET | Freq: Two times a day (BID) | ORAL | 0 refills | Status: AC

## 2021-11-03 MED ORDER — FUROSEMIDE 40 MG PO TABS
40.0000 mg | ORAL_TABLET | Freq: Every day | ORAL | Status: DC
  Administered 2021-11-03: 40 mg via ORAL
  Filled 2021-11-03: qty 1

## 2021-11-03 MED ORDER — POTASSIUM CHLORIDE CRYS ER 20 MEQ PO TBCR
20.0000 meq | EXTENDED_RELEASE_TABLET | Freq: Every day | ORAL | Status: DC
  Administered 2021-11-03: 20 meq via ORAL
  Filled 2021-11-03: qty 1

## 2021-11-03 MED ORDER — FUROSEMIDE 40 MG PO TABS: 40.0000 mg | ORAL_TABLET | Freq: Every day | ORAL | 0 refills | Status: AC

## 2021-11-03 MED ORDER — LOSARTAN POTASSIUM 25 MG PO TABS: 25.0000 mg | ORAL_TABLET | Freq: Every day | ORAL | 0 refills | Status: AC

## 2021-11-03 NOTE — Progress Notes (Signed)
Patient oxygen saturation on room air at rest is 98%. Patient oxygen saturation on room air during activity (standing at bedside) 93-95%.

## 2021-11-03 NOTE — Progress Notes (Signed)
Patient Stable. Vitals taken. A/O x4. Purewick, PIV, and Leads removed. Patient Discharging home via transport.

## 2021-11-03 NOTE — Telephone Encounter (Signed)
Outpatient follow up with Pulmonology requested.  Julian Hy, DO 11/03/21 5:14 PM [facility]

## 2021-11-03 NOTE — Care Management Obs Status (Signed)
[place] NOTIFICATION   Patient Details  Name: Carla Hopkins MRN: [id number] Date of Birth: 1950/04/03   Medicare Observation Status Notification Given: Yes      Dessa Phi, RN 11/03/2021, 1:34 PM

## 2021-11-03 NOTE — Evaluation (Signed)
Occupational Therapy Evaluation Patient Details Name: Carla Hopkins MRN: [id number] DOB: 1951-01-13 Today's Date: 11/03/2021   History of Present Illness 71 y/o woman with a history of stroke with residual weakness on the left, obesity who presented with constipatioin and abdominal pain. In the ED she was disimpacted and felt to have colitis. She was also hypoxic and required 2-5L O2. She denies SOB, cough, wheezing, DOE, edema, or abdominal distention. She admits to being very inactive since a fall last winter-- she is afraid of hurting herself again if she has another fall. She was incidentally found to have multiple abnormalities on Ct imaging including a loculated left pleural effusion.   Clinical Impression   PTA patient reports living alone, having assistance from aides 7days/week for 9 hours daily to assist with ADLs, IADLs and transfers.  She reports staying in her recliner, having her aide assist with stand pivot transfers into an rolling office chair to fit into the bathroom before stand pivoting onto commode; when aide is not present pt reports using pull ups. She was admitted for above and presents near baseline level for ADLs and mobility.  Completes bed mobility without assist using rail, sitting EOB requires min assist for UB ADLs and total assist for LB ADLs.  Declines transfers today.  She is eager to to dc home, has good support from aides for transfers and ADLs.  Discussed safety with skin care, weightshifting, and safety with ADLS.  Will follow acutely, and recommend resumption of HHOT services at dc.       Recommendations for follow up therapy are one component of a multi-disciplinary discharge planning process, led by the attending physician.  Recommendations may be updated based on patient status, additional functional criteria and insurance authorization.   Follow Up Recommendations  Home health OT    Assistance Recommended at Discharge Intermittent Supervision/Assistance   Patient can return home with the following A lot of help with bathing/dressing/bathroom;Assistance with cooking/housework;Assist for transportation;Help with stairs or ramp for entrance    Functional Status Assessment  Patient has had a recent decline in their functional status and demonstrates the ability to make significant improvements in function in a reasonable and predictable amount of time.  Equipment Recommendations  None recommended by OT    Recommendations for Other Services       Precautions / Restrictions Precautions Precautions: Fall Precaution Comments: multiple recent falls Restrictions Weight Bearing Restrictions: No      Mobility Bed Mobility Overal bed mobility: Modified Independent             General bed mobility comments: relies on bed rail, reports sleeping in recliner at home    Transfers                   General transfer comment: pt declined      Balance Overall balance assessment: Needs assistance, History of Falls Sitting-balance support: No upper extremity supported, Feet supported Sitting balance-Leahy Scale: Fair Sitting balance - Comments: min guard to close supervision at EOB, mild posterior lean dynamically but no assist required                                   ADL either performed or assessed with clinical judgement   ADL Overall ADL's : Needs assistance/impaired     Grooming: Supervision/safety;Set up;Sitting           Upper Body Dressing : Minimal assistance;Sitting  Lower Body Dressing: Sitting/lateral leans;Total assistance     Toilet Transfer Details (indicate cue type and reason): pt declined         Functional mobility during ADLs: Supervision/safety General ADL Comments: limited to EOB, pt declined transfers at this time     Vision   Vision Assessment?: No apparent visual deficits     Perception     Praxis      Pertinent Vitals/Pain Pain Assessment Pain Assessment:  No/denies pain     Hand Dominance Right   Extremity/Trunk Assessment Upper Extremity Assessment Upper Extremity Assessment: LUE deficits/detail LUE Deficits / Details: hx of hemiparesis from prior CVA LUE Coordination: decreased fine motor;decreased gross motor   Lower Extremity Assessment Lower Extremity Assessment: Defer to PT evaluation LLE Deficits / Details: H/o L HP 2* CVA 2016 and L femur fx 2018; knee ext +4/5 LLE Sensation: decreased light touch   Cervical / Trunk Assessment Cervical / Trunk Assessment: Normal   Communication Communication Communication: No difficulties   Cognition Arousal/Alertness: Awake/alert Behavior During Therapy: WFL for tasks assessed/performed Overall Cognitive Status: Within Functional Limits for tasks assessed                                       General Comments  eager to DC home with support of her aides    Exercises     Shoulder Instructions      Home Living Family/patient expects to be discharged to:: Private residence Living Arrangements: Alone Available Help at Discharge: Personal care attendant Type of Home: House Home Access: Stairs to enter Technical brewer of Steps: 3 Entrance Stairs-Rails: Left;Right Home Layout: One level     Bathroom Shower/Tub: Occupational psychologist: Standard     Home Equipment: BSC/3in1;Cane - quad;Cane - single point;Rollator (4 wheels);Rolling Walker (2 wheels);Shower seat;Grab bars - tub/shower;Transport chair   Additional Comments: aide comes 9 hours/day x 7 days/week      Prior Functioning/Environment Prior Level of Function : Needs assist       Physical Assist : ADLs (physical);Mobility (physical) Mobility (physical): Transfers;Gait;Stairs ADLs (physical): Bathing;Dressing;Toileting;IADLs Mobility Comments: hasn't been able to do stairs for about a month, not able to do much walking for past month, had HHPT/OT PTA and was working on walking; pt  had multiple falls at home in the past month due to weakness in LLE (CVA in 2016) had L femur fx 2018 (she recovered from this and was walking), fear of falling kept her from walking for past month so she was pivoting to a rolling office chair with assist to roll to bathroom where she pivoted to commode. ADLs Comments: aides assist with all ADLs, IADLs; waits to go to the bathroom until aides arrive, pt uses pull ups when alone. reports spending most of her time in the recliner, able to self feed and groom        OT Problem List: Decreased strength;Impaired balance (sitting and/or standing);Decreased range of motion;Decreased activity tolerance;Decreased safety awareness;Decreased knowledge of use of DME or AE;Decreased knowledge of precautions;Obesity;Impaired UE functional use      OT Treatment/Interventions: Self-care/ADL training;Therapeutic exercise;DME and/or AE instruction;Therapeutic activities;Balance training;Patient/family education;Neuromuscular education    OT Goals(Current goals can be found in the care plan section) Acute Rehab OT Goals Patient Stated Goal: home OT Goal Formulation: With patient Time For Goal Achievement: 11/17/21 Potential to Achieve Goals: Good  OT Frequency: Min 2X/week  Co-evaluation              AM-PAC OT "6 Clicks" Daily Activity     Outcome Measure Help from another person eating meals?: A Little Help from another person taking care of personal grooming?: A Little Help from another person toileting, which includes using toliet, bedpan, or urinal?: Total Help from another person bathing (including washing, rinsing, drying)?: A Lot Help from another person to put on and taking off regular upper body clothing?: A Little Help from another person to put on and taking off regular lower body clothing?: Total 6 Click Score: 13   End of Session Nurse Communication: Mobility status  Activity Tolerance: Patient tolerated treatment well Patient left:  in bed;with call bell/phone within reach;with bed alarm set  OT Visit Diagnosis: Other abnormalities of gait and mobility (R26.89);Muscle weakness (generalized) (M62.81)                Time: 2952-8413 OT Time Calculation (min): 25 min Charges:  OT General Charges $OT Visit: 1 Visit OT Evaluation $OT Eval Moderate Complexity: 1 Mod OT Treatments $Self Care/Home Management : 8-22 mins  Jolaine Artist, OT Acute Rehabilitation Services Office [phone]   Delight Stare 11/03/2021, 1:04 PM

## 2021-11-03 NOTE — Care Management Obs Status (Signed)
[facility] NOTIFICATION   Patient Details  Name: Carla Hopkins MRN: [id number] Date of Birth: 1950-08-25   Medicare Observation Status Notification Given:  Yes    Dessa Phi, RN 11/03/2021, 2:25 PM

## 2021-11-03 NOTE — Discharge Summary (Signed)
Physician Discharge Summary   Patient: Carla Hopkins MRN: [id number] DOB: 1950-12-25  Admit date:     11/02/2021  Discharge date: 11/03/21  Discharge Physician: Raiford Noble, DO   PCP: System, Provider Not In   Recommendations at discharge:   Follow-up with PCP within 1 to 2 weeks and repeat CBC, CMP, mag, Phos within 1 week Follow-up with cardiology in outpatient setting for outpatient pulmonary hypertension workup as well as with a right heart cath as well as further evaluation and management of systolic and diastolic CHF Follow-up with pulmonary in the outpatient setting and have outpatient pulmonary function test as well as a repeat PSG for OSA Follow-up with vascular surgery Dr. Scot Dock for 4.7 cm thoracoabdominal aortic aneurysm at the aortic hiatus just below the renal arteries with extensive mural thrombus with areas of ulceration up to 50% stenosis of the infrarenal aorta and focal saccular outpouching adjacent to the takeoff of the celiac trunk and SMA with a 1.4 cm neck for referral to [facility] and follow-up CT/MRI every 6 months Follow-up in the outpatient setting with general surgery/urology for the 4.1 cm left adrenal mass with a small area of internal macroscopic fat suggesting a myelo lipoma with biochemical evaluation and biochemical lab evaluation for functional status and pheochromocytoma prior to surgical resection  Discharge Diagnoses: Principal Problem:   Proctitis Active Problems:   Hypothyroidism   Benign essential HTN   Hyperlipidemia   DM2 (diabetes mellitus, type 2) ([facility])   Tobacco abuse   History of CVA with residual deficit   Fecal impaction (HCC)   Hypokalemia   Adrenal mass (HCC)   Thoracoabdominal aortic aneurysm (HCC)   Acute respiratory failure with hypoxia (HCC)   Prolonged QT interval   Colitis  Resolved Problems:   * No resolved hospital problems. [facility] Course: HPI per Dr. Cherylann Ratel on 11/02/21 HPI: Carla Hopkins is a 71 y.o. female with  medical history significant of CVA w/ left side residuals, hypothyroidism, HLD, diet-controlled DM2, HTN, tobacco abuse. Presenting with LLQ abdominal pain. She notes that she has not had a BM in 2 days. She tried stool softeners and laxatives, but they did not help. She woke up this morning at 4am w/ sharp pain in her LLQ. It was non-radiating. She didn't have any fever, N/V. She became concerned and called for EMS. She denies any other aggravating or alleviating factors.    **Interim History He was evaluated by pulmonary and her abdominal discomfort and fecal impaction improved.  Pulmonary recommending outpatient follow-up with a right heart cath for workup for pulmonary hypertension as well as outpatient PFTs.  Echocardiogram was done and showed evidence of CHF and she was placed on daily Lasix and her cardiologist was made aware and they will follow her up in the outpatient setting.  Given her findings on the scan she will have outpatient follow-up with vascular surgery for referral to [facility] as well as evaluation by general surgery/urology for 4.1 cm left adrenal mass.  Patient improved significantly and did not desaturate on ambulatory home O2 screen and PT OT recommending home health with caregivers.  She improved significantly and she is stable to discharge with outpatient follow-up with specialist.  Assessment and Plan:  Fecal impaction Rectosigmoid proctitis     - place in obs, tele     - she has been disimpacted and is feeling better     - will start rocephin, flagyl and changed to Augmentin at discharge     - continue  BM regimen at discharge and continue with Senokot   Acute respiratory failure w/ hypoxia, improved significantly and she did not desaturate     - multifactorial     - she has a mildly loculated effusion; pulm consulted, appreciate assistance; it was too small to tap and the CT scan showed that the mild loculated pleural effusion was seen on left thorax extending up to the upper  mid thorax with a loculated pleural effusion component along the distal descending thoracic aorta as above     - she has an elevated BNP and some evidence of pHTN; no peripheral edema and pulm edema noted; echo shows evidence of systolic and diastolic CHF but currently she appears euvolemic but will place her on daily Lasix and potassium supplementation     - denies chest pain and ekg is ok -CT scan noted that the pulmonary artery measures 3.2 cm in greatest dimension reflecting pulmonary hypertension and that there was reflux of the contrast into the IVC and hepatic veins of likely right heart dysfunction -Her primary cardiologist has been made aware and they will follow her up in the outpatient setting for heart failure follow-up as well as outpatient evaluation for pulm hypertension with a right heart cath   Hypokalemia Prolonged QT     - replace K+, check Mg2+; check renal function panel at 1700hrs to assess progress     - avoid QT prolonging meds   Thoracal abdominal aortic aneurysm with extensive mural thrombus with areas of ulceration and up to 50% stenosis of the infrarenal aorta     - EDP spoke with Vascular surgery (Dr. Doren Custard); can follow up outpt w/ Dr. Scot Dock w/ ultimate referral to [facility] and outpatient CT/MRI scan every 6 months -Noted to have an ascending thoracic aorta that measures 4.1 cm in greatest dimension as well as an aorta at the hiatus measures 4.7 m in the greatest dimension with semicircumferential thrombus anteriorly and a 4.7 cm abdominal aortic aneurysm -Will need follow-up every 6 months and referral to [facility]   Left adrenal mass     - as per CT ab/pelvis; c/w myelolipoma     -Dr. Marylyn Ishihara spoke with gen surg; recommend outpt follow up with them and appointment will be made and follow-up -Per recommendations recommendations for biochemical evaluation for functional status and pheochromocytoma prior to resection   HTN     - not on meds at home; PRNs available; start cozaar  and Lasix at discharge   DM2     - diet-controlled     - A1c, SSI, glucose checks, DM diet   Hypothyroidism     - continue home regimen   Tobacco abuse     - counsel against further use   HLD Hx of CVA w/ left side residuals     - she is no longer taking ASA and statin at home -She is complaining of some slight dizziness which improved.  Head CT done and showed no acute intracranial abnormality and a chronic right MCA territory infarction -Follow-up with PCP and neurology in outpatient setting   Consultants: EDP discussed with Vascular; Pulmonary   Procedures performed: CT Chest/Abd/Pelvis; ECHO  Disposition: Home health Diet recommendation:  Discharge Diet Orders (From admission, onward)     Start     Ordered   11/03/21 0000  Diet - low sodium heart healthy        11/03/21 1724   11/03/21 0000  Diet Carb Modified  11/03/21 1724           Cardiac diet DISCHARGE MEDICATION: Allergies as of 11/03/2021       Reactions   Docusate Sodium Diarrhea        Medication List     STOP taking these medications    acetaminophen 650 MG CR tablet Commonly known as: TYLENOL Replaced by: acetaminophen 325 MG tablet   aspirin 81 MG chewable tablet   atorvastatin 80 MG tablet Commonly known as: LIPITOR   Moderna COVID-19 Vaccine 100 MCG/0.5ML injection Generic drug: COVID-19 mRNA vaccine (Moderna)       TAKE these medications    acetaminophen 325 MG tablet Commonly known as: TYLENOL Take 2 tablets (650 mg total) by mouth every 6 (six) hours as needed for mild pain (or Fever >/= 101). Replaces: acetaminophen 650 MG CR tablet   amoxicillin-clavulanate 875-125 MG tablet Commonly known as: AUGMENTIN Take 1 tablet by mouth every 12 (twelve) hours for 8 days.   cholecalciferol 25 MCG (1000 UNIT) tablet Commonly known as: VITAMIN D3 Take 1,000 Units by mouth daily.   furosemide 40 MG tablet Commonly known as: LASIX Take 1 tablet (40 mg total) by mouth  daily.   ibuprofen 200 MG tablet Commonly known as: ADVIL Take 400 mg by mouth 2 (two) times daily as needed (leg pain).   levothyroxine 88 MCG tablet Commonly known as: SYNTHROID TAKE 1 TABLET EVERY DAY BEFORE BREAKFAST (DUE FOR LABS BEFORE NEXT REFILL) What changed: See the new instructions.   losartan 25 MG tablet Commonly known as: COZAAR Take 1 tablet (25 mg total) by mouth daily. Start taking on: November 04, 2021   multivitamin tablet Take 2 tablets by mouth daily.   potassium chloride SA 20 MEQ tablet Commonly known as: KLOR-CON M Take 1 tablet (20 mEq total) by mouth daily.   senna 8.6 MG Tabs tablet Commonly known as: SENOKOT Take 1 tablet (8.6 mg total) by mouth 2 (two) times daily.   TUMS PO Take 2 tablets by mouth daily as needed (upset stomach).        Follow-up Information     Ralene Ok, MD. Schedule an appointment as soon as possible for a visit.   Specialty: General Surgery Why: to discuss possible resection of left adrenal mass Contact information: [address] [place] [address] [phone]         [facility] [place] Follow up.   Specialty: Home Health Services Why: [facility] physical/occupational therapy/aide Contact information: [address] [place] [address] [phone]         Julian Hy, DO. Call.   Specialty: Pulmonary Disease Why: Follow up in the outpatient setting of Official PFTs and repeat PSG as well as Pulmonary Follow up Contact information: [place] [address] [phone]         Nigel Mormon, MD. Call.   Specialties: Cardiology, Radiology Why: Follow up within 1-2 weeks and have follow up for Heart Failure and outpatient workup for [facility] with Right Heart Cath Contact information: [place] [address] [phone]         Angelia Mould, MD Follow up.   Specialties: Vascular Surgery,  Cardiology Why: Follow up for Aneursym and Referral to [facility] for Mural Thrombus Contact information: [address] [place] [address] [phone]                Discharge Exam: Danley Danker  Weights   11/02/21 0803  Weight: 68 kg   Vitals:   11/03/21 0354 11/03/21 1335  BP: (!) 155/91 (!) 147/95  Pulse: 66 65  Resp: 18 20  Temp: 98.3 F (36.8 C) (!) 97.4 F (36.3 C)  SpO2: 97% 96%   Examination: Physical Exam:  Constitutional: WN/WD overweight Caucasian female, in no acute distress Respiratory: Diminished to auscultation bilaterally, no wheezing, rales, rhonchi or crackles. Normal respiratory effort and patient is not tachypenic. No accessory muscle use.  Unlabored breathing and she did not desaturate on ambulatory home O2 screen Cardiovascular: RRR, no murmurs / rubs / gallops. S1 and S2 auscultated.  Trace extremity edema Abdomen: Soft, non-tender, distended secondary body habitus.  No appreciable hepatosplenomegaly. Bowel sounds positive.  GU: Deferred. Musculoskeletal: No clubbing / cyanosis of digits/nails. No joint deformity upper and lower extremities. Neurologic: CN 2-12 grossly intact with no appreciable focal deficits. Romberg sign and cerebellar reflexes not assessed.  Psychiatric: Normal judgment and insight. Alert and oriented x 3. Normal mood and appropriate affect.   Condition at discharge: stable  The results of significant diagnostics from this hospitalization (including imaging, microbiology, ancillary and laboratory) are listed below for reference.   Imaging Studies: ECHOCARDIOGRAM COMPLETE  Result Date: 11/03/2021    ECHOCARDIOGRAM REPORT   Patient Name:   Carla Hopkins Date of Exam: 11/03/2021 Medical Rec #:  [id number]      Height:       62.5 in Accession #:    [id number]     Weight:       150.0 lb Date of Birth:  [birth year]      BSA:          1.702 m Patient Age:    71 years       BP:           147/95 mmHg Patient Gender: F              HR:           65  bpm. Exam Location:  Inpatient Procedure: 2D Echo, Cardiac Doppler and Color Doppler Indications:    CHF-Acute Diastolic F74.94  History:        Patient has prior history of Echocardiogram examinations, most                 recent 06/05/2018. Stroke; Risk Factors:Hypertension, Diabetes,                 Dyslipidemia and Tobacco abuse. Obstructive sleep apnea, Acute                 respiratory failure with hypoxia.  Sonographer:    Alvino Chapel RCS Referring Phys: [id number] [place]  1. Left ventricular ejection fraction, by estimation, is 45 to 50%. The left ventricle has mildly decreased function. The left ventricle demonstrates regional wall motion abnormalities (see scoring diagram/findings for description). There is severe concentric left ventricular hypertrophy. Left ventricular diastolic parameters are consistent with Grade II diastolic dysfunction (pseudonormalization). Elevated left ventricular end-diastolic pressure.  2. Right ventricular systolic function is normal. The right ventricular size is normal. There is mildly elevated pulmonary artery systolic pressure.  3. Left atrial size was severely dilated.  4. Right atrial size was moderately dilated.  5. The mitral valve is normal in structure. No evidence of mitral valve regurgitation. No evidence of mitral stenosis.  6. The aortic valve is normal in structure. Aortic valve regurgitation is not visualized. Aortic valve sclerosis/calcification is present, without any  evidence of aortic stenosis.  7. The inferior vena cava is normal in size with greater than 50% respiratory variability, suggesting right atrial pressure of 3 mmHg. FINDINGS  Left Ventricle: Left ventricular ejection fraction, by estimation, is 45 to 50%. The left ventricle has mildly decreased function. The left ventricle demonstrates regional wall motion abnormalities. The left ventricular internal cavity size was normal in size. There is severe concentric left ventricular  hypertrophy. Left ventricular diastolic parameters are consistent with Grade II diastolic dysfunction (pseudonormalization). Elevated left ventricular end-diastolic pressure.  LV Wall Scoring: The apical anterior segment, basal inferior segment, and basal inferoseptal segment are akinetic. The anterior wall, mid and distal lateral wall, entire anterior septum, mid and distal inferior wall, posterior wall, mid anterolateral segment, mid inferoseptal segment, and apex are hypokinetic. Right Ventricle: The right ventricular size is normal. No increase in right ventricular wall thickness. Right ventricular systolic function is normal. There is mildly elevated pulmonary artery systolic pressure. The tricuspid regurgitant velocity is 2.74  m/s, and with an assumed right atrial pressure of 8 mmHg, the estimated right ventricular systolic pressure is 16.1 mmHg. Left Atrium: Left atrial size was severely dilated. Right Atrium: Right atrial size was moderately dilated. Pericardium: There is no evidence of pericardial effusion. Presence of epicardial fat layer. Mitral Valve: The mitral valve is normal in structure. No evidence of mitral valve regurgitation. No evidence of mitral valve stenosis. Tricuspid Valve: The tricuspid valve is normal in structure. Tricuspid valve regurgitation is not demonstrated. No evidence of tricuspid stenosis. Aortic Valve: The aortic valve is normal in structure. Aortic valve regurgitation is not visualized. Aortic valve sclerosis/calcification is present, without any evidence of aortic stenosis. Aortic valve mean gradient measures 7.0 mmHg. Aortic valve peak  gradient measures 14.3 mmHg. Aortic valve area, by VTI measures 2.38 cm. Pulmonic Valve: The pulmonic valve was normal in structure. Pulmonic valve regurgitation is not visualized. No evidence of pulmonic stenosis. Aorta: The aortic root is normal in size and structure. Venous: The inferior vena cava is normal in size with greater than 50%  respiratory variability, suggesting right atrial pressure of 3 mmHg. IAS/Shunts: No atrial level shunt detected by color flow Doppler.  LEFT VENTRICLE PLAX 2D LVIDd:         4.20 cm   Diastology LVIDs:         3.30 cm   LV e' medial:    3.64 cm/s LV PW:         1.40 cm   LV E/e' medial:  31.3 LV IVS:        2.10 cm   LV e' lateral:   4.95 cm/s LVOT diam:     1.80 cm   LV E/e' lateral: 23.0 LV SV:         93 LV SV Index:   54 LVOT Area:     2.54 cm  RIGHT VENTRICLE RV S prime:     13.90 cm/s TAPSE (M-mode): 2.6 cm LEFT ATRIUM              Index        RIGHT ATRIUM           Index LA diam:        3.90 cm  2.29 cm/m   RA Area:     18.50 cm LA Vol (A2C):   150.0 ml 88.14 ml/m  RA Volume:   47.80 ml  28.09 ml/m LA Vol (A4C):   106.0 ml 62.29 ml/m LA Biplane Vol: 129.0  ml 75.80 ml/m  AORTIC VALVE AV Area (Vmax):    2.30 cm AV Area (Vmean):   2.48 cm AV Area (VTI):     2.38 cm AV Vmax:           189.00 cm/s AV Vmean:          117.000 cm/s AV VTI:            0.389 m AV Peak Grad:      14.3 mmHg AV Mean Grad:      7.0 mmHg LVOT Vmax:         171.00 cm/s LVOT Vmean:        114.000 cm/s LVOT VTI:          0.364 m LVOT/AV VTI ratio: 0.94  AORTA Ao Root diam: 3.20 cm MITRAL VALVE                TRICUSPID VALVE MV Area (PHT): 2.03 cm     TR Peak grad:   30.0 mmHg MV Decel Time: 373 msec     TR Vmax:        274.00 cm/s MV E velocity: 114.00 cm/s MV A velocity: 92.50 cm/s   SHUNTS MV E/A ratio:  1.23         Systemic VTI:  0.36 m                             Systemic Diam: 1.80 cm Kardie Tobb DO Electronically signed by Berniece Salines DO Signature Date/Time: 11/03/2021/4:59:07 PM    Final    CT HEAD WO CONTRAST (5MM)  Result Date: 11/03/2021 CLINICAL DATA:  Neuro deficit, acute, stroke suspected EXAM: CT HEAD WITHOUT CONTRAST TECHNIQUE: Contiguous axial images were obtained from the base of the skull through the vertex without intravenous contrast. RADIATION DOSE REDUCTION: This exam was performed according to the  departmental dose-optimization program which includes automated exposure control, adjustment of the mA and/or kV according to patient size and/or use of iterative reconstruction technique. COMPARISON:  None Available. FINDINGS: Brain: There is no acute intracranial hemorrhage, mass effect, or edema. No acute appearing loss of gray-white differentiation. Chronic right MCA territory infarction involving frontoparietal lobes cough posterior insula, and temporal lobe. Associated volume loss. There is no extra-axial fluid collection. No hydrocephalus. Vascular: There is atherosclerotic calcification at the skull base. Skull: Calvarium is unremarkable. Sinuses/Orbits: Opacified left frontal, anterior ethmoid, and included maxillary sinuses. Other: None. IMPRESSION: No acute intracranial abnormality. Chronic right MCA territory infarction. Electronically Signed   By: Macy Mis M.D.   On: 11/03/2021 12:51   CT ABDOMEN PELVIS W CONTRAST  Result Date: 11/02/2021 CLINICAL DATA:  Abdominal pain, acute, nonlocalized EXAM: CT ABDOMEN AND PELVIS WITH CONTRAST TECHNIQUE: Multidetector CT imaging of the abdomen and pelvis was performed using the standard protocol following bolus administration of intravenous contrast. RADIATION DOSE REDUCTION: This exam was performed according to the departmental dose-optimization program which includes automated exposure control, adjustment of the mA and/or kV according to patient size and/or use of iterative reconstruction technique. CONTRAST:  139m OMNIPAQUE IOHEXOL 350 MG/ML SOLN COMPARISON:  None Available. FINDINGS: Lower chest: Cardiomegaly. Aneurysmal dilation of the descending thoracic aorta at the aortic hiatus measuring up to 4.8 cm (series 2, image 9). Trace effusions with bibasilar atelectasis, left greater than right. Hepatobiliary: No focal liver abnormality is seen. Cholelithiasis. No acute cholecystitis. Pancreas: Unremarkable. No pancreatic ductal dilatation or surrounding  inflammatory changes. Spleen: Normal in size.  No focal abnormality. Adrenals/Urinary Tract: There is a 4.1 x 3.9 x 2.6 cm left adrenal mass with small area of internal macroscopic fat (series 2 image 15, series 6 image 92). Mild prominence of the renal collecting systems due to a markedly distended urinary bladder. Atrophic right kidney. Punctate nonobstructive right upper pole renal stone. Stomach/Bowel: Tiny hiatal hernia. The stomach is otherwise within normal limits. There is no evidence of bowel obstruction.The appendix is normal. Diverticulosis of the descending and proximal sigmoid colon. There is long segment submucosal edema of the rectosigmoid colon. Adjacent mild stranding, worst along the rectum and presacral soft tissues. Vascular/Lymphatic: Tortuous abdominal aorta with aneurysmal dilation of the infrarenal aorta measuring up to 4.8 x 4.6 cm just below the renal arteries (series 2, image 23), up to 4.3 x 4.1 cm in the mid infrarenal aorta (series 2, image 31, and up to 4.2 cm just above the aortic bifurcation into the iliacs (series 2, image 38). There is a focal aneurysmal outpouching adjacent to the takeoff of the celiac trunk and SMA on the right in the upper abdomen with a 1.4 cm neck (series 2, image 17). There is extensive mural thrombus throughout the abdominal aorta, with areas of ulceration (series 2, images 22-23, and with up to 50% stenosis of the infrarenal aorta. Reproductive: Unremarkable. Other: There is mild body wall edema. No bowel containing hernia. Small fat containing inguinal hernias. Trace free fluid in pelvis. No intraperitoneal free gas. Musculoskeletal: Prior left proximal femur fixation with adjacent heterotopic ossification. There is no evidence of acute osseous abnormality or suspicious osseous lesion. There are multilevel degenerative changes of the spine. IMPRESSION: Findings consistent with rectosigmoid colitis/proctitis. No focal fluid collection. Colonic  diverticulosis.  Cholelithiasis. Thoracoabdominal aortic aneurysm, measuring up to 4.8 cm at the aortic hiatus and just below the renal arteries. Extensive mural thrombus with areas of ulceration and up to 50% stenosis of the infrarenal aorta. Focal saccular outpouching adjacent to the takeoff of the celiac trunk and SMA with a 1.4 cm neck. Recommend vascular consultation and follow-up CT/MR every 6 months.This recommendation follows ACR consensus guidelines: White Paper of the ACR Incidental Findings Committee II on Vascular Findings. J Am Coll Radiol 2013; 10:789-794. 4.1 cm left adrenal mass with small area of internal macroscopic fat suggesting myelolipoma. Surgical consultation is recommended as myelolipomas > 4 cm are at an increased risk for hemorrhage and rupture. Consider biochemical lab evaluation for functional status and pheochromocytoma prior to resection. JACR 2017 Aug; 14(8):1038-44, JCAT 2016 Mar-Apr; 40(2):194-200, Urol J 2006 Spring; 3(2):71-4. Electronically Signed   By: Maurine Simmering M.D.   On: 11/02/2021 11:50   CT Angio Chest Pulmonary Embolism (PE) W or WO Contrast  Result Date: 11/02/2021 CLINICAL DATA:  Chest pain and shortness of breath, pleurisy, effusion suspected. EXAM: CT ANGIOGRAPHY CHEST WITH CONTRAST TECHNIQUE: Multidetector CT imaging of the chest was performed using the standard protocol during bolus administration of intravenous contrast. Multiplanar CT image reconstructions and MIPs were obtained to evaluate the vascular anatomy. RADIATION DOSE REDUCTION: This exam was performed according to the departmental dose-optimization program which includes automated exposure control, adjustment of the mA and/or kV according to patient size and/or use of iterative reconstruction technique. CONTRAST:  154m OMNIPAQUE IOHEXOL 350 MG/ML SOLN COMPARISON:  July 24, 2014 FINDINGS: Cardiovascular: Satisfactory opacification of the pulmonary arteries to the segmental level. No evidence of  pulmonary embolism. Main pulmonary artery measures 3.2 cm and is dilated. There is some reflux of the contrast seen into  the IVC and then the hepatic veins. Ascending thoracic aorta measures 4.1 cm in greatest dimension. Moderate atheromatous calcifications at the arch and the root of the aorta. Mild cardiomegaly. Mild atheromatous calcifications of the coronary arteries. No pericardial effusion. Mediastinum/Nodes: No enlarged mediastinal, hilar, or axillary lymph nodes. Thyroid gland, trachea, and esophagus demonstrate no significant findings. Lungs/Pleura: There is no consolidation, discrete pulmonary nodule or pneumothorax seen. Mild centrilobular emphysema in the upper lungs. There is mild loculated pleural effusion seen at the left side. The loculated effusion extends up to the upper-mid thorax. There is loculated pleural effusion component along the distal descending thoracic aorta (images 29 through 50 of series 4). There is some bibasilar pleural thickening seen. Mild left basal atelectasis. Upper Abdomen: There is aneurysmal dilatation of the aorta seen at the aortic hiatus measuring 4.7 cm in greatest dimension with semi circumferential thrombus anteriorly. Musculoskeletal: Moderate thoracic spondylosis with prominent endplate osteophytes, endplate sclerosis and loss of the disc space/vacuum disc phenomenon and is most prominent at T12-L1. Minimal retrolisthesis at T12-L1 as well. Review of the MIP images confirms the above findings. IMPRESSION: 1. No filling defects seen in the pulmonary arteries up to the segmental branches. Pulmonary artery measures 3.2 cm in greatest dimension reflecting pulmonary hypertension. There is reflux of the contrast seen into the IVC and hepatic veins of likely right heart dysfunction. 2.  Ascending thoracic aorta measures 4.1 cm in greatest dimension. 3.  Aortic atherosclerosis (ICD10-I70.0) 4. Aorta at the hiatus measures 4.7 cm in greatest dimension with semi  circumferential thrombus anteriorly. 4.7 cm abdominal aortic aneurysm.Recommend follow-up every 6 months and vascular consultation.Reference: J Am Coll Radiol 2542;70:623-762. 5. There is mild loculated pleural effusion seen in the left thorax extending up to the upper-mid thorax. There is loculated pleural effusion component along the distal descending thoracic aorta as above. Bibasal pleural thickening greater on the left. Mild left basilar atelectasis. 6. Moderate thoracic spondylosis and is most prominent at T12-L1 as above. Electronically Signed   By: Frazier Richards M.D.   On: 11/02/2021 11:49   DG Chest Port 1 View  Result Date: 11/02/2021 CLINICAL DATA:  Dyspnea EXAM: PORTABLE CHEST 1 VIEW COMPARISON:  08/05/2014 FINDINGS: Cardiomegaly. Aortic atherosclerosis. No focal airspace consolidation, pleural effusion, or pneumothorax. IMPRESSION: No active disease. Electronically Signed   By: Davina Poke D.O.   On: 11/02/2021 09:48    Microbiology: Results for orders placed or performed in visit on 09/09/15  WOUND CULTURE     Status: None   Collection Time: 09/09/15  2:29 PM   Specimen: Wound  Result Value Ref Range Status   Gram Stain Abundant  Final   Gram Stain WBC present-predominately PMN  Final   Gram Stain No Squamous Epithelial Cells Seen  Final   Gram Stain Abundant Gram Positive Cocci In Pairs  Final   Gram Stain Abundant Gram Variable Rods  Final   Organism ID, Bacteria NORMAL SKIN FLORA  Final   Organism ID, Bacteria Multiple Organisms Present,None Predominant  Final   Organism ID, Bacteria No Staphylococcus aureus isolated  Final   Organism ID, Bacteria NO GROUP A STREP (S. PYOGENES) ISOLATED  Final    Labs: CBC: Recent Labs  Lab 11/02/21 0825 11/03/21 0435  WBC 11.1* 8.0  HGB 15.1* 14.3  HCT 45.3 44.3  MCV 92.1 93.1  PLT 195 831   Basic Metabolic Panel: Recent Labs  Lab 11/02/21 0825 11/02/21 1622 11/03/21 0435  NA 142 142 142  K 2.4* 3.4* 3.5  CL 105 104  106  CO2 '27 31 29  '$ GLUCOSE 196* 95 85  BUN '15 15 13  '$ CREATININE 1.17* 1.14* 1.08*  CALCIUM 9.2 9.4 8.7*  MG 2.3  --  2.2  PHOS  --  2.7 2.5   Liver Function Tests: Recent Labs  Lab 11/02/21 0825 11/02/21 1622 11/03/21 0435  AST 22  --  26  ALT 15  --  14  ALKPHOS 58  --  54  BILITOT 1.0  --  0.6  PROT 6.3*  --  5.8*  ALBUMIN 3.2* 3.4* 2.9*   CBG: Recent Labs  Lab 11/02/21 1615 11/02/21 2117 11/03/21 0727 11/03/21 1126 11/03/21 1603  GLUCAP 89 90 99 117* 115*    Discharge time spent: greater than 30 minutes.  Signed: Raiford Noble, DO [facility] 11/03/2021

## 2021-11-03 NOTE — Evaluation (Signed)
Physical Therapy Evaluation Patient Details Name: Carla Hopkins MRN: [id number] DOB: [birth year] Today's Date: 11/03/2021  History of Present Illness  71 y/o woman with a history of stroke with residual weakness on the left, obesity who presented with constipatioin and abdominal pain. In the ED she was disimpacted and felt to have colitis. She was also hypoxic and required 2-5L O2. She denies SOB, cough, wheezing, DOE, edema, or abdominal distention. She admits to being very inactive since a fall last winter-- she is afraid of hurting herself again if she has another fall. She was incidentally found to have multiple abnormalities on Ct imaging including a loculated left pleural effusion,  Clinical Impression  Pt admitted with above diagnosis. Pt sat at edge of bed for ~10 minutes, posterior lean when she didn't have feet and single UE support. Pt performed sit to stand with min/guard assistance and was able to maintain standing for ~45 seconds with single UE support. She refused to attempt pivoting to the recliner 2* fear of falling. She reports she had multiple falls ~1 month ago and since then has been only performing transfers at home with assistance from her aides. She stated she is confident that she can perform a transfer now, but does not want to. She appears to be functioning at her baseline. She has a aides 9 hours/day x 7 days/week, so has needed level of assistance.  Pt currently with functional limitations due to the deficits listed below (see PT Problem List). Pt will benefit from skilled PT to increase their independence and safety with mobility to allow discharge to the venue listed below.          Recommendations for follow up therapy are one component of a multi-disciplinary discharge planning process, led by the attending physician.  Recommendations may be updated based on patient status, additional functional criteria and insurance authorization.  Follow Up Recommendations Home health  PT      Assistance Recommended at Discharge Intermittent Supervision/Assistance  Patient can return home with the following  A lot of help with walking and/or transfers;A little help with bathing/dressing/bathroom;Assistance with cooking/housework;Assist for transportation;Help with stairs or ramp for entrance    Equipment Recommendations None recommended by PT  Recommendations for Other Services       Functional Status Assessment Patient has had a recent decline in their functional status and demonstrates the ability to make significant improvements in function in a reasonable and predictable amount of time.     Precautions / Restrictions Precautions Precautions: Fall Precaution Comments: multiple recent falls Restrictions Weight Bearing Restrictions: No      Mobility  Bed Mobility Overal bed mobility: Modified Independent             General bed mobility comments: heavy reliance upon bed rail, HOB up; posterior lean in sitting when feet unsupported    Transfers Overall transfer level: Needs assistance Equipment used: 1 person hand held assist Transfers: Sit to/from Stand Sit to Stand: Min guard           General transfer comment: sit to stand at EOB, pt refused to attempt stand pivot transfer though stated she feels she could transfer if she wanted to, fear of falling is primary factor in self limiting of mobility. SaO2 93-95% on room air with activity    Ambulation/Gait               General Gait Details: pt refused  Soil scientist  Mobility    Modified Rankin (Stroke Patients Only)       Balance Overall balance assessment: Needs assistance, History of Falls Sitting-balance support: Feet supported, Single extremity supported Sitting balance-Leahy Scale: Poor Sitting balance - Comments: posterior lean when pt didn't have UE/LE supported Postural control: Posterior lean Standing balance support: Single extremity supported,  Reliant on assistive device for balance Standing balance-Leahy Scale: Poor                               Pertinent Vitals/Pain Pain Assessment Pain Assessment: No/denies pain    Home Living Family/patient expects to be discharged to:: Private residence Living Arrangements: Alone Available Help at Discharge: Personal care attendant Type of Home: House Home Access: Stairs to enter Entrance Stairs-Rails: Chemical engineer of Steps: 3   Home Layout: One level Home Equipment: BSC/3in1;Cane - quad;Cane - single point;Rollator (4 wheels);Rolling Walker (2 wheels);Shower seat;Grab bars - tub/shower;Transport chair Additional Comments: aide comes 9 hours/day x 7 days/week    Prior Function               Mobility Comments: hasn't been able to do stairs for about a month, not able to do much walking for past month, had HHPT/OT PTA and was working on walking; pt had multiple falls at home in the past month due to weakness in LLE (CVA in 2016) had L femur fx 2018 (she recovered from this and was walking), fear of falling kept her from walking for past month so she was pivoting to a rolling office chair to roll to bathroom where she pivoted to commode. ADLs Comments: aides assist with all ADLs, IADLs     Hand Dominance        Extremity/Trunk Assessment   Upper Extremity Assessment Upper Extremity Assessment: Defer to OT evaluation    Lower Extremity Assessment Lower Extremity Assessment: LLE deficits/detail LLE Deficits / Details: H/o L HP 2* CVA 2016 and L femur fx 2018; knee ext +4/5 LLE Sensation: decreased light touch    Cervical / Trunk Assessment Cervical / Trunk Assessment: Normal  Communication   Communication: No difficulties  Cognition Arousal/Alertness: Awake/alert Behavior During Therapy: WFL for tasks assessed/performed Overall Cognitive Status: Within Functional Limits for tasks assessed                                           General Comments      Exercises     Assessment/Plan    PT Assessment Patient needs continued PT services  PT Problem List Decreased mobility;Decreased balance;Decreased activity tolerance;Impaired sensation       PT Treatment Interventions Therapeutic activities;Gait training;Therapeutic exercise;Functional mobility training;Balance training;Patient/family education    PT Goals (Current goals can be found in the Care Plan section)  Acute Rehab PT Goals Patient Stated Goal: get stronger, stop falling PT Goal Formulation: With patient Time For Goal Achievement: 11/17/21 Potential to Achieve Goals: Good    Frequency Min 3X/week     Co-evaluation               AM-PAC PT "6 Clicks" Mobility  Outcome Measure Help needed turning from your back to your side while in a flat bed without using bedrails?: A Little Help needed moving from lying on your back to sitting on the side of a flat bed without using bedrails?: A  Lot Help needed moving to and from a bed to a chair (including a wheelchair)?: A Little Help needed standing up from a chair using your arms (e.g., wheelchair or bedside chair)?: A Little Help needed to walk in hospital room?: A Lot Help needed climbing 3-5 steps with a railing? : Total 6 Click Score: 14    End of Session   Activity Tolerance: Patient tolerated treatment well Patient left: in bed;with bed alarm set;with call bell/phone within reach Nurse Communication: Mobility status PT Visit Diagnosis: Unsteadiness on feet (R26.81);Difficulty in walking, not elsewhere classified (R26.2);History of falling (Z91.81);Repeated falls (R29.6)    Time: 2111-5520 PT Time Calculation (min) (ACUTE ONLY): 29 min   Charges:   PT Evaluation $PT Eval Moderate Complexity: 1 Mod PT Treatments $Therapeutic Activity: 8-22 mins      Blondell Reveal Kistler PT 11/03/2021  Acute Rehabilitation Services  Office [phone]

## 2021-11-03 NOTE — TOC Transition Note (Addendum)
Transition of Care [facility]) - CM/SW Discharge Note   Patient Details  Name: Carla Hopkins MRN: [id number] Date of Birth: 1950-05-01  Transition of Care [facility]) CM/SW Contact:  Dessa Phi, RN Phone Number: 11/03/2021, 11:56 AM   Clinical Narrative: d/c today active w/[facility]-HHPT/OT/aide rep Claiborne Billings aware;patient has private caregiver. PTAR for transport home.  -4:08p Nsg aware to call PTAR once d/c order in place.     Final next level of care: [place] Barriers to Discharge: No Barriers Identified   Patient Goals and CMS Choice Patient states their goals for this hospitalization and ongoing recovery are:: Home   Choice offered to / list presented to : Patient  Discharge Placement                       Discharge Plan and Services   Discharge Planning Services: CM Consult Post Acute Care Choice: Home Health                    HH Arranged: PT, OT, Nurse's Aide [place] Agency: [place] Date [place]: 11/03/21 Time [place]: 14 Representative spoke with at [place]: [place] Determinants of Health ([place]) Interventions     Readmission Risk Interventions     No data to display

## 2021-11-03 NOTE — Care Management CC44 (Signed)
Condition Code 44 Documentation Completed  Patient Details  Name: Carla Hopkins MRN: [id number] Date of Birth: April 23, 1950   Condition Code 44 given:  Yes Patient signature on Condition Code 44 notice:  Yes Documentation of 2 MD's agreement:  Yes Code 44 added to claim:  Yes    Dessa Phi, RN 11/03/2021, 2:25 PM

## 2021-11-03 NOTE — Progress Notes (Signed)
*  PRELIMINARY RESULTS* Echocardiogram 2D Echocardiogram has been performed.  Carla Hopkins 11/03/2021, 3:59 PM

## 2021-11-05 NOTE — Telephone Encounter (Signed)
LLVMTCB to schedule pulmonary consult. [place]

## 2021-11-09 LAB — ALDOSTERONE + RENIN ACTIVITY W/ RATIO
ALDO / PRA Ratio: >15 (ref 0.0–30.0)
Aldosterone: 2.5 ng/dL (ref 0.0–30.0)
PRA LC/MS/MS: <0.167 ng/mL/hr — ABNORMAL LOW (ref 0.167–5.380)

## 2021-11-11 ENCOUNTER — Telehealth: Payer: Self-pay | Admitting: Internal Medicine

## 2021-11-11 NOTE — Telephone Encounter (Signed)
sure

## 2021-11-11 NOTE — Telephone Encounter (Signed)
Patient asking if her appointment can be a telephone visit as she is nonambulatory and will have to pay a large amount of money to get transportation.

## 2021-11-16 ENCOUNTER — Ambulatory Visit: Payer: Medicare PPO | Admitting: Internal Medicine

## 2021-11-16 ENCOUNTER — Encounter: Payer: Self-pay | Admitting: Internal Medicine

## 2021-11-16 DIAGNOSIS — I10 Essential (primary) hypertension: Secondary | ICD-10-CM

## 2021-11-16 DIAGNOSIS — E782 Mixed hyperlipidemia: Secondary | ICD-10-CM

## 2021-11-16 DIAGNOSIS — I6523 Occlusion and stenosis of bilateral carotid arteries: Secondary | ICD-10-CM

## 2021-11-16 NOTE — Progress Notes (Signed)
Primary Physician/Referring:  System, Provider Not In  Patient ID: Carla Hopkins, female    DOB: December 23, 1950, 71 y.o.   MRN: [id number]  Chief Complaint  Patient presents with   Pulmonary Hypertension   HPI:    Carla Hopkins  is a 71 y.o. female with past medical history significant for carotid stenosis s/- CEA, HTN, and previous CVA who presents for telephone follow-up visit. She is currently not ambulatory and does not have a way to get to the office. She does have PT come to her home 3x weekly and they check her BP. She states it has been normal since she was started on medications in the hospital. She is tolerating the medications without issues. Patient is agreeable to getting echocardiogram next time she can come to the office. Denies chest pain, shortness of breath, palpitations, diaphoresis, syncope, edema, PND, orthopnea.   Past Medical History:  Diagnosis Date   Hypertension    Hypothyroidism    Stroke [facility])    Past Surgical History:  Procedure Laterality Date   ENDARTERECTOMY Right 08/05/2014   Procedure: Right Carotid ENDARTERECTOMY ;  Surgeon: Elam Dutch, MD;  Location: [facility] OR;  Service: Vascular;  Laterality: Right;   LEG SURGERY     PATCH ANGIOPLASTY Right 08/05/2014   Procedure: PATCH ANGIOPLASTY;  Surgeon: Elam Dutch, MD;  Location: [facility] OR;  Service: Vascular;  Laterality: Right;   TUBAL LIGATION     Family History  Adopted: Yes  Problem Relation Age of Onset   Colon cancer Neg Hx     Social History   Tobacco Use   Smoking status: Former    Packs/day: 20.00    Years: 50.00    Total pack years: 1000.00    Types: Cigarettes    Quit date: 2016    Years since quitting: 7.6   Smokeless tobacco: Never   Tobacco comments:    quit smoking July 24, 2014  Substance Use Topics   Alcohol use: Yes    Alcohol/week: 0.0 standard drinks of alcohol    Comment: once yearly   Marital Status: Single  ROS  Review of Systems  Constitutional: Negative.   Cardiovascular: Negative.   Respiratory: Negative.    Musculoskeletal:  Positive for muscle weakness.  All other systems reviewed and are negative.  Objective  There were no vitals taken for this visit. There is no height or weight on file to calculate BMI.     11/03/2021    8:27 PM 11/03/2021    1:35 PM 11/03/2021    3:54 AM  Vitals with BMI  Systolic 841 660 630  Diastolic 93 95 91  Pulse 70 65 66     NOT APPLICABLE - PHONE VISIT Physical Exam  Medications and allergies   Allergies  Allergen Reactions   Docusate Sodium Diarrhea     Medication list after today's encounter   Current Outpatient Medications:    acetaminophen (TYLENOL) 325 MG tablet, Take 2 tablets (650 mg total) by mouth every 6 (six) hours as needed for mild pain (or Fever >/= 101)., Disp: 20 tablet, Rfl: 0   Calcium Carbonate Antacid (TUMS PO), Take 2 tablets by mouth every 30 (thirty) days., Disp: , Rfl:    cholecalciferol (VITAMIN D3) 25 MCG (1000 UNIT) tablet, Take 1,000 Units by mouth daily., Disp: , Rfl:    furosemide (LASIX) 40 MG tablet, Take 1 tablet (40 mg total) by mouth daily., Disp: 30 tablet, Rfl: 0   ibuprofen (ADVIL) 200 MG tablet,  Take 400 mg by mouth 2 (two) times daily as needed (leg pain)., Disp: , Rfl:    levothyroxine (SYNTHROID) 88 MCG tablet, TAKE 1 TABLET EVERY DAY BEFORE BREAKFAST (DUE FOR LABS BEFORE NEXT REFILL) (Patient taking differently: Take 88 mcg by mouth daily before breakfast.), Disp: 30 tablet, Rfl: 0   losartan (COZAAR) 25 MG tablet, Take 1 tablet (25 mg total) by mouth daily., Disp: 30 tablet, Rfl: 0   Multiple Vitamin (MULTIVITAMIN) tablet, Take 2 tablets by mouth daily., Disp: , Rfl:    potassium chloride SA (KLOR-CON M) 20 MEQ tablet, Take 1 tablet (20 mEq total) by mouth daily., Disp: 30 tablet, Rfl: 0   senna (SENOKOT) 8.6 MG TABS tablet, Take 1 tablet (8.6 mg total) by mouth 2 (two) times daily., Disp: 120 tablet, Rfl: 0  Laboratory examination:   Lab Results   Component Value Date   NA 142 11/03/2021   K 3.5 11/03/2021   CO2 29 11/03/2021   GLUCOSE 85 11/03/2021   BUN 13 11/03/2021   CREATININE 1.08 (H) 11/03/2021   CALCIUM 8.7 (L) 11/03/2021   GFRNONAA 55 (L) 11/03/2021       Latest Ref Rng & Units 11/03/2021    4:35 AM 11/02/2021    4:22 PM 11/02/2021    8:25 AM  CMP  Glucose 70 - 99 mg/dL 85  95  196   BUN 8 - 23 mg/dL '13  15  15   '$ Creatinine 0.44 - 1.00 mg/dL 1.08  1.14  1.17   Sodium 135 - 145 mmol/L 142  142  142   Potassium 3.5 - 5.1 mmol/L 3.5  3.4  2.4   Chloride 98 - 111 mmol/L 106  104  105   CO2 22 - 32 mmol/L '29  31  27   '$ Calcium 8.9 - 10.3 mg/dL 8.7  9.4  9.2   Total Protein 6.5 - 8.1 g/dL 5.8   6.3   Total Bilirubin 0.3 - 1.2 mg/dL 0.6   1.0   Alkaline Phos 38 - 126 U/L 54   58   AST 15 - 41 U/L 26   22   ALT 0 - 44 U/L 14   15       Latest Ref Rng & Units 11/03/2021    4:35 AM 11/02/2021    8:25 AM 04/21/2017   12:45 PM  CBC  WBC 4.0 - 10.5 K/uL 8.0  11.1  10.9   Hemoglobin 12.0 - 15.0 g/dL 14.3  15.1  11.4   Hematocrit 36.0 - 46.0 % 44.3  45.3  35.8   Platelets 150 - 400 K/uL 183  195  316     Lipid Panel No results for input(s): "CHOL", "TRIG", "LDLCALC", "VLDL", "HDL", "CHOLHDL", "LDLDIRECT" in the last 8760 hours.  HEMOGLOBIN A1C Lab Results  Component Value Date   HGBA1C 5.3 11/02/2021   MPG 105.41 11/02/2021   TSH No results for input(s): "TSH" in the last 8760 hours.  External labs:     Radiology:    Cardiac Studies:   No results found for this or any previous visit from the past 1095 days.     No results found for this or any previous visit from the past 1095 days.     EKG:       Assessment     ICD-10-CM   1. Essential hypertension  I10     2. Bilateral carotid artery stenosis  I65.23     3. Mixed hyperlipidemia  E78.2  No orders of the defined types were placed in this encounter.   No orders of the defined types were placed in this encounter.   There are no  discontinued medications.   Recommendations:   Carla Hopkins is a 71 y.o.  female presenting for a phone visit after hospital admission  Continue current cardiac medications. Encourage low-sodium diet, less than 2000 mg daily. Schedule imaging tests in office - echo once ambulatory. Follow-up in 3 months or sooner if needed.     Floydene Flock, DO, [facility]  11/16/2021, 12:37 PM Office: [phone] Pager: [phone]

## 2021-12-14 ENCOUNTER — Encounter: Payer: Self-pay | Admitting: Gastroenterology

## 2021-12-30 ENCOUNTER — Encounter: Payer: Medicare PPO | Admitting: Vascular Surgery

## 2022-02-11 ENCOUNTER — Telehealth: Payer: Self-pay

## 2022-02-11 NOTE — Telephone Encounter (Signed)
Pt states that she is still not ambulatory and wants to know if ok to reschedule appt 02/16/22 w/ [place] or if it can be switched to a Telephone visit. Please advise

## 2022-02-16 ENCOUNTER — Ambulatory Visit: Payer: Medicare PPO | Admitting: Internal Medicine
